# Patient Record
Sex: Male | Born: 1935
Health system: Southern US, Community
[De-identification: ages and names within clinical notes are randomized; demographics above are authoritative.]

## PROBLEM LIST (undated history)

## (undated) DIAGNOSIS — J309 Allergic rhinitis, unspecified: Secondary | ICD-10-CM

## (undated) DIAGNOSIS — E785 Hyperlipidemia, unspecified: Secondary | ICD-10-CM

## (undated) DIAGNOSIS — G4733 Obstructive sleep apnea (adult) (pediatric): Secondary | ICD-10-CM

## (undated) DIAGNOSIS — D126 Benign neoplasm of colon, unspecified: Secondary | ICD-10-CM

## (undated) DIAGNOSIS — M1990 Unspecified osteoarthritis, unspecified site: Secondary | ICD-10-CM

## (undated) DIAGNOSIS — F3289 Other specified depressive episodes: Secondary | ICD-10-CM

## (undated) DIAGNOSIS — K219 Gastro-esophageal reflux disease without esophagitis: Secondary | ICD-10-CM

## (undated) DIAGNOSIS — K589 Irritable bowel syndrome without diarrhea: Secondary | ICD-10-CM

## (undated) DIAGNOSIS — H1045 Other chronic allergic conjunctivitis: Secondary | ICD-10-CM

## (undated) DIAGNOSIS — F329 Major depressive disorder, single episode, unspecified: Secondary | ICD-10-CM

## (undated) DIAGNOSIS — IMO0002 Reserved for concepts with insufficient information to code with codable children: Secondary | ICD-10-CM

## (undated) DIAGNOSIS — F411 Generalized anxiety disorder: Secondary | ICD-10-CM

## (undated) HISTORY — PX: COLONOSCOPY: SHX174

## (undated) HISTORY — DX: Generalized anxiety disorder: F41.1

## (undated) HISTORY — PX: TONSILLECTOMY: SUR1361

## (undated) HISTORY — DX: Major depressive disorder, single episode, unspecified: F32.9

## (undated) HISTORY — DX: Other chronic allergic conjunctivitis: H10.45

## (undated) HISTORY — PX: APPENDECTOMY: SHX54

## (undated) HISTORY — DX: Irritable bowel syndrome, unspecified: K58.9

## (undated) HISTORY — DX: Obstructive sleep apnea (adult) (pediatric): G47.33

## (undated) HISTORY — DX: Unspecified osteoarthritis, unspecified site: M19.90

## (undated) HISTORY — DX: Allergic rhinitis, unspecified: J30.9

## (undated) HISTORY — DX: Reserved for concepts with insufficient information to code with codable children: IMO0002

## (undated) HISTORY — DX: Benign neoplasm of colon, unspecified: D12.6

## (undated) HISTORY — DX: Other specified depressive episodes: F32.89

## (undated) HISTORY — DX: Gastro-esophageal reflux disease without esophagitis: K21.9

## (undated) HISTORY — DX: Hyperlipidemia, unspecified: E78.5

## (undated) HISTORY — PX: ESOPHAGOGASTRODUODENOSCOPY: SHX1529

---
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
## 1999-11-26 ENCOUNTER — Encounter: Admission: RE | Admit: 1999-11-26 | Discharge: 1999-12-30 | Payer: Self-pay | Admitting: Orthopedic Surgery

## 2000-01-27 ENCOUNTER — Encounter: Payer: Self-pay | Admitting: Gastroenterology

## 2000-01-27 ENCOUNTER — Ambulatory Visit (HOSPITAL_COMMUNITY): Admission: RE | Admit: 2000-01-27 | Discharge: 2000-01-27 | Payer: Self-pay | Admitting: Gastroenterology

## 2001-01-24 ENCOUNTER — Encounter: Admission: RE | Admit: 2001-01-24 | Discharge: 2001-02-28 | Payer: Self-pay | Admitting: Orthopedic Surgery

## 2001-09-07 HISTORY — PX: CATARACT EXTRACTION W/ INTRAOCULAR LENS  IMPLANT, BILATERAL: SHX1307

## 2004-09-22 ENCOUNTER — Encounter: Admission: RE | Admit: 2004-09-22 | Discharge: 2004-12-21 | Payer: Self-pay | Admitting: Orthopedic Surgery

## 2004-10-20 ENCOUNTER — Ambulatory Visit: Payer: Self-pay | Admitting: Internal Medicine

## 2004-12-17 ENCOUNTER — Ambulatory Visit: Payer: Self-pay | Admitting: Internal Medicine

## 2005-02-19 ENCOUNTER — Ambulatory Visit: Payer: Self-pay | Admitting: Internal Medicine

## 2005-07-08 ENCOUNTER — Ambulatory Visit: Payer: Self-pay | Admitting: Internal Medicine

## 2005-07-22 ENCOUNTER — Ambulatory Visit: Payer: Self-pay | Admitting: Gastroenterology

## 2005-08-06 ENCOUNTER — Ambulatory Visit: Payer: Self-pay | Admitting: Gastroenterology

## 2005-09-17 ENCOUNTER — Ambulatory Visit: Payer: Self-pay | Admitting: Gastroenterology

## 2005-11-24 ENCOUNTER — Ambulatory Visit: Payer: Self-pay | Admitting: Internal Medicine

## 2005-12-18 ENCOUNTER — Ambulatory Visit: Payer: Self-pay | Admitting: Internal Medicine

## 2006-03-17 ENCOUNTER — Ambulatory Visit: Payer: Self-pay | Admitting: Internal Medicine

## 2006-07-16 ENCOUNTER — Ambulatory Visit: Payer: Self-pay | Admitting: Gastroenterology

## 2006-07-28 ENCOUNTER — Ambulatory Visit: Payer: Self-pay | Admitting: Internal Medicine

## 2006-12-15 ENCOUNTER — Ambulatory Visit: Payer: Self-pay | Admitting: Internal Medicine

## 2006-12-20 ENCOUNTER — Ambulatory Visit: Payer: Self-pay | Admitting: Internal Medicine

## 2007-03-21 ENCOUNTER — Ambulatory Visit: Payer: Self-pay | Admitting: Internal Medicine

## 2007-04-25 ENCOUNTER — Ambulatory Visit: Payer: Self-pay | Admitting: Internal Medicine

## 2007-05-05 ENCOUNTER — Ambulatory Visit: Payer: Self-pay | Admitting: Internal Medicine

## 2007-12-19 DIAGNOSIS — K589 Irritable bowel syndrome without diarrhea: Secondary | ICD-10-CM

## 2007-12-19 DIAGNOSIS — Z860100 Personal history of colon polyps, unspecified: Secondary | ICD-10-CM | POA: Insufficient documentation

## 2007-12-19 DIAGNOSIS — H1045 Other chronic allergic conjunctivitis: Secondary | ICD-10-CM

## 2007-12-19 DIAGNOSIS — F411 Generalized anxiety disorder: Secondary | ICD-10-CM

## 2007-12-19 DIAGNOSIS — J302 Other seasonal allergic rhinitis: Secondary | ICD-10-CM | POA: Insufficient documentation

## 2007-12-19 DIAGNOSIS — G4733 Obstructive sleep apnea (adult) (pediatric): Secondary | ICD-10-CM

## 2007-12-19 DIAGNOSIS — F329 Major depressive disorder, single episode, unspecified: Secondary | ICD-10-CM

## 2007-12-19 DIAGNOSIS — K219 Gastro-esophageal reflux disease without esophagitis: Secondary | ICD-10-CM | POA: Insufficient documentation

## 2007-12-20 ENCOUNTER — Ambulatory Visit: Payer: Self-pay | Admitting: Internal Medicine

## 2008-03-20 ENCOUNTER — Encounter: Payer: Self-pay | Admitting: Internal Medicine

## 2008-09-18 ENCOUNTER — Encounter: Payer: Self-pay | Admitting: Internal Medicine

## 2008-12-31 ENCOUNTER — Ambulatory Visit: Payer: Self-pay | Admitting: Internal Medicine

## 2009-12-30 ENCOUNTER — Ambulatory Visit: Payer: Self-pay | Admitting: Internal Medicine

## 2010-03-20 ENCOUNTER — Ambulatory Visit: Payer: Self-pay | Admitting: Internal Medicine

## 2010-03-20 DIAGNOSIS — J40 Bronchitis, not specified as acute or chronic: Secondary | ICD-10-CM

## 2010-03-27 ENCOUNTER — Telehealth (INDEPENDENT_AMBULATORY_CARE_PROVIDER_SITE_OTHER): Payer: Self-pay | Admitting: *Deleted

## 2010-04-09 ENCOUNTER — Encounter: Payer: Self-pay | Admitting: Internal Medicine

## 2010-04-10 ENCOUNTER — Telehealth (INDEPENDENT_AMBULATORY_CARE_PROVIDER_SITE_OTHER): Payer: Self-pay | Admitting: *Deleted

## 2010-04-11 ENCOUNTER — Ambulatory Visit: Payer: Self-pay | Admitting: Cardiovascular Disease

## 2010-04-11 ENCOUNTER — Ambulatory Visit: Payer: Self-pay | Admitting: Internal Medicine

## 2010-04-11 DIAGNOSIS — R0789 Other chest pain: Secondary | ICD-10-CM

## 2010-04-11 LAB — CONVERTED CEMR LAB
BUN: 17 mg/dL (ref 6–23)
CO2: 32 meq/L (ref 19–32)
Calcium: 8.8 mg/dL (ref 8.4–10.5)
Chloride: 103 meq/L (ref 96–112)
Creatinine, Ser: 0.9 mg/dL (ref 0.4–1.5)
GFR calc non Af Amer: 86.99 mL/min (ref >60)
Glucose, Bld: 103 mg/dL — ABNORMAL HIGH (ref 70–99)
Potassium: 4.7 meq/L (ref 3.5–5.1)
Sodium: 139 meq/L (ref 135–145)

## 2010-04-14 ENCOUNTER — Encounter: Payer: Self-pay | Admitting: Internal Medicine

## 2010-04-14 ENCOUNTER — Telehealth: Payer: Self-pay | Admitting: Internal Medicine

## 2010-04-14 ENCOUNTER — Ambulatory Visit: Payer: Self-pay | Admitting: Internal Medicine

## 2010-04-14 DIAGNOSIS — R1319 Other dysphagia: Secondary | ICD-10-CM

## 2010-04-29 ENCOUNTER — Ambulatory Visit: Payer: Self-pay | Admitting: Internal Medicine

## 2010-05-02 ENCOUNTER — Telehealth: Payer: Self-pay | Admitting: Internal Medicine

## 2010-05-16 ENCOUNTER — Ambulatory Visit: Payer: Self-pay | Admitting: Internal Medicine

## 2010-05-21 ENCOUNTER — Ambulatory Visit: Payer: Self-pay | Admitting: Internal Medicine

## 2010-05-21 ENCOUNTER — Ambulatory Visit: Admission: RE | Admit: 2010-05-21 | Discharge: 2010-05-21 | Payer: Self-pay | Admitting: Internal Medicine

## 2010-06-24 ENCOUNTER — Ambulatory Visit: Payer: Self-pay | Admitting: Internal Medicine

## 2010-09-14 ENCOUNTER — Emergency Department (HOSPITAL_COMMUNITY)
Admission: EM | Admit: 2010-09-14 | Discharge: 2010-09-14 | Payer: Self-pay | Source: Home / Self Care | Admitting: Emergency Medicine

## 2010-09-22 LAB — BASIC METABOLIC PANEL
BUN: 23 mg/dL (ref 6–23)
CO2: 28 mEq/L (ref 19–32)
Calcium: 8.8 mg/dL (ref 8.4–10.5)
Chloride: 104 mEq/L (ref 96–112)
Creatinine, Ser: 1.03 mg/dL (ref 0.4–1.5)
GFR calc Af Amer: >60 mL/min (ref >60)
GFR calc non Af Amer: >60 mL/min (ref >60)
Glucose, Bld: 138 mg/dL — ABNORMAL HIGH (ref 70–99)
Potassium: 4.1 mEq/L (ref 3.5–5.1)
Sodium: 139 mEq/L (ref 135–145)

## 2010-09-22 LAB — DIFFERENTIAL
Basophils Absolute: 0 10*3/uL (ref 0.0–0.1)
Basophils Relative: 0 % (ref 0–1)
Eosinophils Absolute: 0 10*3/uL (ref 0.0–0.7)
Eosinophils Relative: 0 % (ref 0–5)
Lymphocytes Relative: 3 % — ABNORMAL LOW (ref 12–46)
Lymphs Abs: 0.2 10*3/uL — ABNORMAL LOW (ref 0.7–4.0)
Monocytes Absolute: 0.2 10*3/uL (ref 0.1–1.0)
Monocytes Relative: 2 % — ABNORMAL LOW (ref 3–12)
Neutro Abs: 6.7 10*3/uL (ref 1.7–7.7)
Neutrophils Relative %: 95 % — ABNORMAL HIGH (ref 43–77)

## 2010-09-22 LAB — CBC
HCT: 40.5 % (ref 39.0–52.0)
Hemoglobin: 13.4 g/dL (ref 13.0–17.0)
MCH: 28.1 pg (ref 26.0–34.0)
MCHC: 33.1 g/dL (ref 30.0–36.0)
MCV: 84.9 fL (ref 78.0–100.0)
Platelets: 188 10*3/uL (ref 150–400)
RBC: 4.77 MIL/uL (ref 4.22–5.81)
RDW: 13.1 % (ref 11.5–15.5)
WBC: 7.1 10*3/uL (ref 4.0–10.5)

## 2010-10-09 NOTE — Letter (Signed)
Summary: EGD Instructions  [facility]  [address] [place], [place] [address]   Phone: [phone]  Fax: [phone]       [name] [name]    02/03/1931    MRN: [id number]       Procedure Day /Date: 04-29-10     Arrival Time: 7:30 AM      Procedure Time: 8:30 AM     Location of Procedure:                    X     [facility] (4th Floor)  PREPARATION FOR ENDOSCOPY   On 04-29-10 THE DAY OF THE PROCEDURE:  1.   No solid foods, milk or milk products are allowed after midnight the night before your procedure.  2.   Do not drink anything colored red or purple.  Avoid juices with pulp.  No orange juice.  3.  You may drink clear liquids until  6:30 AM , which is 2 hours before your procedure.                                                                                                CLEAR LIQUIDS INCLUDE: Water Jello Ice Popsicles Tea (sugar ok, no milk/cream) Powdered fruit flavored drinks Coffee (sugar ok, no milk/cream) Gatorade Juice: apple, white grape, white cranberry  Lemonade Clear bullion, consomm, broth Carbonated beverages (any kind) Strained chicken noodle soup Hard Candy   MEDICATION INSTRUCTIONS  Unless otherwise instructed, you should take regular prescription medications with a small sip of water as early as possible the morning of your procedure.        OTHER INSTRUCTIONS  You will need a responsible adult at least 75 years of age to accompany you and drive you home.   This person must remain in the waiting room during your procedure.  Wear loose fitting clothing that is easily removed.  Leave jewelry and other valuables at home.  However, you may wish to bring a book to read or an iPod/MP3 player to listen to music as you wait for your procedure to start.  Remove all body piercing jewelry and leave at home.  Total time from sign-in until discharge is approximately 2-3 hours.  You should go home directly after your  procedure and rest.  You can resume normal activities the day after your procedure.  The day of your procedure you should not:   Drive   Make legal decisions   Operate machinery   Drink alcohol   Return to work  You will receive specific instructions about eating, activities and medications before you leave.    The above instructions have been reviewed and explained to me by   _______________________    I fully understand and can verbalize these instructions _____________________________ Date _________

## 2010-10-09 NOTE — Assessment & Plan Note (Signed)
Summary: ROV ///KP   Copy to:  Jetty Duhamel MD Primary Provider/Referring Provider:  Dossie Arbour MD  CC:  follow up visit-still having some pain in center of chest; Test results attached..  History of Present Illness: April 11, 2010-Allergic rhinoconjunctivitis, OSA, chest pain..................[name] [name] here They say he is no better. Ok when he wakes, but as he talks in the morning, he begins to hurt at xiphoid. Dr Eloise Harman did CXR "clear" and gave him magic mouthwash. He saw Dr Jearld Fenton for ENT- intended because he had been complaining of a change in his voice/ throat with difficulty reaching high notes. Dr Jearld Fenton told him vocal cords looked ok. He has been taking maalox regularly after every meal. Eating doesn't affect this xiphoid pain. He denies duplication of pain by thumb pressure on xiphoid or bilateral rib compression.  May 16, 2010- Allergic rhinoconjunctivitis, Hx OSA, Chest pain, GERD................[name] [name] here Still indicates focal midsternal/ xiphoid pain as dull ache 2 inches above tip of xiphoid and can feel air flow through it "like a raw spot" if he tries to sing or raises his voice. ENT exam was negative and Endoscopy by Dr Leone Payor suggested dysmotility but otherwise unremarka ble. CT had shown dilated esophagus. Relation to hard singing remains unlcear, but he says he isn't able to sing now because it hurts.. Denies dyspnea with rest or exertion, cough or wheeze. He is quite anxious about this discomfort which hes says hasn't improved in 2 months. He asks again about bronchoscopy to look in his trachea.  Seasonal rhinitis mainly Spring- wears mask and wrap around glasses then, not needed in the Fall.  June 24, 2010-  Allergic rhinoconjunctivitis, Hx OSA, Chest pain, GERD................[name] [name] here Still hurts under xiphoid if he has to push his speech harder, like talking to his brother who is hard of hearing. He likes the nebulizer, saying 9 breaths are enough, done two times  a day. Eating also  reduces the pain. He learned of someone elsewhere who had a similar discomfort and had to stop all singing for 6 months. Denies SOB or dysphagia.  Bronchoscopy  reviewed again- cytology negative. Evidence of vocal cord trauma c/w hard singing was noted.    Preventive Screening-Counseling & Management  Alcohol-Tobacco     Smoking Status: never  Current Medications (verified): 1)  Flunisolide 29 Mcg/act  Soln (Flunisolide) .... Use As Needed 2)  Omeprazole 20 Mg  Cpdr (Omeprazole) .... Take 1 Tablet By Mouth Two Times A Day 3)  Librax 2.5-5 Mg  Caps (Clidinium-Chlordiazepoxide) .... Take 1 To 2 Tabs By Mouth Before Meals and At Bedtime. 4)  Acular 0.5 % Soln (Ketorolac Tromethamine) .... Use As Directed 5)  Cpap 10 Cwp Advanced 6)  Pravachol 10 Mg Tabs (Pravastatin Sodium) .... Take 1 By Mouth Once Daily 7)  Asap Solution .... Use As Directed Per Directions On Package.  Allergies (verified): 1)  ! Penicillin 2)  ! Amoxicillin  Past History:  Past Surgical History: Last updated: 01-13-09 Tonsillectomy Appendectomy- ruptured  Family History: Last updated: 2009/01/13 Father- died colon cancer, [employer] coal miner [employer]- died pneumonia, MI, CVA  Social History: Last updated: 05/16/2010 Patient never smoked.  Married [employer] x 4 years  Risk Factors: Smoking Status: never (06/24/2010)  Past Medical History: DEPRESSION (ICD-311) ANXIETY (ICD-300.00) GERD (ICD-530.81)- Esoph dysmotility, dilated stricture                                 -  Endoscopy Dr Leone Payor 04/29/10 ALLERGIC RHINITIS (ICD-477.9)- stopped allergy vaccine 2008 Chest pain-atypical- bronchoscopy 05/21/10- vocal cord trauma OBSTRUCTIVE SLEEP APNEA (ICD-327.23) ALLERGIC CONJUNCTIVITIS (ICD-372.14) POLYP, COLON (ICD-211.3) IBS (ICD-564.1)    Review of Systems      See HPI       The patient complains of chest pain.  The patient denies shortness of breath with activity, shortness of  breath at rest, productive cough, non-productive cough, coughing up blood, irregular heartbeats, acid heartburn, indigestion, loss of appetite, weight change, abdominal pain, difficulty swallowing, sore throat, tooth/dental problems, headaches, nasal congestion/difficulty breathing through nose, sneezing, itching, joint stiffness or pain, rash, and change in color of mucus.    Vital Signs:  Patient profile:   77 year old male Height:      73 inches Weight:      186.25 pounds BMI:     24.66 O2 Sat:      97 % on Room air Pulse rate:   62 / minute BP sitting:   112 / 64  (left arm) Cuff size:   regular  Vitals Entered By: Reynaldo Minium CMA (June 24, 2010 3:07 PM)  O2 Flow:  Room air CC: follow up visit-still having some pain in center of chest; Test results attached.   Physical Exam  Additional Exam:  General: A/Ox3; pleasant and cooperative, NAD, WDWN SKIN: no rash, lesions NODES: no lymphadenopathy HEENT: [place]/AT, EOM- WNL, Conjuctivae- clear, PERRLA, TM-WNL, Nose- clear, Throat- clear and wnl, Mallampati II-III NECK: Supple w/ fair ROM, JVD- none, normal carotid impulses w/o bruits Thyroid- normal to palpation CHEST: Clear to P&A. Not obvioulsy tender to light pressure lower sternum. No sternal rub or noise HEART: RRR, no m/g/r heard ABDOMEN: Soft and nl; JXB:JYNW, nl pulses, no edema  NEURO: Grossly intact to observation      Impression & Recommendations:  Problem # 1:  CHEST PAIN, ATYPICAL (ICD-786.59)  This was some sort of muscle strain syndrome related to singing too hard. Bronchoscopy was benign except for the vocal cord strain.  I can only suggest routine analgesics and avoidance of strain/ hard siging.  Problem # 2:  BRONCHITIS, MILD (ICD-490)  He is doing well and is clear now. He agrees to flu shot.   Problem # 3:  DYSPHAGIA (GNF-621.30) He will follow with GI for his esophageal problems as directed. Reflux precautions were reviewed.   Medications Added to  Medication List This Visit: 1)  Asap Solution  .... Use as directed per directions on package.  Other Orders: Est. Patient Level III (86578) Flu Vaccine 63yrs + MEDICARE PATIENTS (I6962) Administration Flu vaccine - MCR (X5284)  Patient Instructions: 1)  Please schedule a follow-up appointment in 1 year. 2)  Flu vax   Flu Vaccine Consent Questions     Do you have a history of severe allergic reactions to this vaccine? no    Any prior history of allergic reactions to egg and/or gelatin? no    Do you have a sensitivity to the preservative Thimersol? no    Do you have a past history of Guillan-Barre Syndrome? no    Do you currently have an acute febrile illness? no    Have you ever had a severe reaction to latex? no    Vaccine information given and explained to patient? yes    Are you currently pregnant? no    Lot Number:[id number]   Exp Date:03/07/2011   Site Given  Left Deltoid IMt-CCC] Randell Loop CMA  June 24, 2010 4:09 PM       .  [email or web address]

## 2010-10-09 NOTE — Assessment & Plan Note (Signed)
Summary: 1 YEAR RETURN/MH   Primary Provider/Referring Provider:  Dossie Arbour  CC:  Yearly follow up visit.  History of Present Illness: 12/20/07- Mr. Landin returns for follow-up of allergic rhinitis with conjunctivitis and of obstructive sleep apnea.he has stopped allergy vaccine because of cost r a year ago.  He has done very well so far with no seasonal flare, yet this spring.  He wears a surgical mask to work outside and he wears wrap- around sunglasses to keep the dust and pollen out of his eyes. he does have a cortisone nose spray used when needed.  He is compliant with CPAP at 10CWP using it all night every night.  He is satisfied with his sleep management.  His eye doctor had given him Elestat eyedrops.  Jan 21, 2009- Allergic rhinoconjunctivitis, OSA Copntinues CPAP 10- says he can't sleep without it. Compliance check was excellent at last report from DME. He sleeps very well and denies awareness of breakthrough snoring.  Allergy- wears mask outdoors in season. needed Zpak for cold with sinus pain last week, responsive to Z pak. Back to singing in choir. Good f/u at [facility] clinic for general care. wears wrap around glasses to protect his eyes. Discussed his hx of GERD- much better on two times a day omeprazole.  December 30, 2009- Allergic rhinoconjunctivitis, OSA He still wears wrap around sun glasses to keep draft out of his eyes. Wears mask outside in Spring. Acular drops stop the itch in his eyes. We compared dry eye with allergic iritation. Uses Systane. Never breathing problems- sings regularly.  CPAP- used all night every night at 10 with good control. Weight loss with dieting as he tries to manage his ulcer disease.    Current Medications (verified): 1)  Flunisolide 29 Mcg/act  Soln (Flunisolide) .... Use As Needed 2)  Omeprazole 20 Mg  Cpdr (Omeprazole) .... Take 1 Tablet By Mouth Once A Day 3)  Librax 2.5-5 Mg  Caps (Clidinium-Chlordiazepoxide) .... Take 1 To 2 Tabs By Mouth  Before Meals and At Bedtime. 4)  Acular 0.5 % Soln (Ketorolac Tromethamine) .... Use As Directed 5)  Cpap 10 Cwp Advanced 6)  Pravachol 10 Mg Tabs (Pravastatin Sodium) .... Take 1 By Mouth Once Daily  Allergies (verified): 1)  ! Penicillin 2)  ! Amoxicillin  Past History:  Past Surgical History: Last updated: 2009/01/21 Tonsillectomy Appendectomy- ruptured  Family History: Last updated: 2009-01-21 Father- died colon cancer, Black Lung coal miner [employer]- died pneumonia, MI, CVA  Social History: Last updated: 12/20/2007 Patient never smoked.   Risk Factors: Smoking Status: never (12/20/2007)  Past Medical History: DEPRESSION (ICD-311) ANXIETY (ICD-300.00) GERD (ICD-530.81) ALLERGIC RHINITIS (ICD-477.9)- stopped allergy vaccine 2008 OBSTRUCTIVE SLEEP APNEA (ICD-327.23) ALLERGIC CONJUNCTIVITIS (ICD-372.14) POLYP, COLON (ICD-211.3) IBS (ICD-564.1)    Review of Systems      See HPI  The patient denies anorexia, fever, weight loss, weight gain, vision loss, decreased hearing, hoarseness, chest pain, syncope, dyspnea on exertion, peripheral edema, prolonged cough, headaches, hemoptysis, abdominal pain, and severe indigestion/heartburn.    Vital Signs:  Patient profile:   77 year old male Height:      73 inches Weight:      189.50 pounds BMI:     25.09 O2 Sat:      95 % on Room air Pulse rate:   79 / minute BP sitting:   118 / 78  (left arm) Cuff size:   regular  Vitals Entered By: Reynaldo Minium CMA (December 30, 2009 11:06 AM)  O2 Flow:  Room air  Physical Exam  Additional Exam:  General: A/Ox3; pleasant and cooperative, NAD, WDWN SKIN: no rash, lesions NODES: no lymphadenopathy HEENT: [place]/AT, EOM- WNL, Conjuctivae- clear, PERRLA, TM-WNL, Nose- clear, Throat- clear and wnl, Mallampati II-III NECK: Supple w/ fair ROM, JVD- none, normal carotid impulses w/o bruits Thyroid- normal to palpation CHEST: Clear to P&A HEART: RRR, no m/g/r heard ABDOMEN: Soft and  nl; ZOX:WRUE, nl pulses, no edema  NEURO: Grossly intact to observation      Impression & Recommendations:  Problem # 1:  OBSTRUCTIVE SLEEP APNEA (ICD-327.23)  Great compliance and control. No cpap changes needed.  Problem # 2:  ALLERGIC RHINITIS (ICD-477.9)  Continues to do well, partly with use of mask and wrapped around glasses. His updated medication list for this problem includes:    Flunisolide 29 Mcg/act Soln (Flunisolide) ..... Use as needed  Medications Added to Medication List This Visit: 1)  Omeprazole 20 Mg Cpdr (Omeprazole) .... Take 1 tablet by mouth three times a day  Other Orders: Est. Patient Level III (45409)  Patient Instructions: 1)  Please schedule a follow-up appointment in 1 year. 2)  continue CPAP

## 2010-10-09 NOTE — Progress Notes (Signed)
Summary: omeprazole ?'s  Phone Note Outgoing Call Call back at [facility] Phone [phone]   Call placed by: Laureen Ochs LPN,  May 02, 2010 2:17 PM Call placed to: Patient Summary of Call: I was told he had some ?'s about PPI please follow-up with him re: his concerns about omeprazole Iva Boop MD, [facility]  May 02, 2010 1:37 PM   Follow-up for Phone Call        Pt. states he read in his "pill book" that Omeprazole shouldn't be used as a long term med for ulcers.  Pt. takes Omeprazole two times a day, states he has so much acid he needs  2 daily.  Symptoms are fairly well controlled, but pt. uses Maalox after every meal to help control the acid, pt. states he has been doing this for years.  Follow-up by: Laureen Ochs LPN,  May 02, 2010 2:20 PM  Additional Follow-up for Phone Call Additional follow up Details #1::        Explain he is taking it for GERD  Thanks continue what he is doing Additional Follow-up by: Iva Boop MD, Clementeen Graham,  May 02, 2010 3:58 PM    Additional Follow-up for Phone Call Additional follow up Details #2::    Message left for pt. with above MD instructions. Pt. instructed to call back as needed.  Follow-up by: Laureen Ochs LPN,  May 02, 2010 4:12 PM

## 2010-10-09 NOTE — Assessment & Plan Note (Signed)
Summary: 3 weeks/ mbw   Copy to:  Jetty Duhamel MD Primary Provider/Referring Provider:  Dossie Arbour MD  CC:  3 week follow up visit-OSA and Atypical chest pain(last OV);endo results attached..  History of Present Illness: March 20, 2010- Allergic rhinoconjunctivitis, OSA............................Marland Kitchenwife here He sang a long string of songs 2 weeks ago at church, and seems to question if he strained his chest.. Has been getting weak, feeling irritated deep to his mid chest. Denies exertional or pleuritic pain. He has a significant past hx of peptic ulcer disease and admits his discomfort feels a little like that at his xiphoid. Has been taking a "silver solution" patent medicine which seems to make him feel beter.  April 11, 2010-Allergic rhinoconjunctivitis, OSA, chest pain..................Marland Kitchenwife here They say he is no better. Ok when he wakes, but as he talks in the morning, he begins to hurt at xiphoid. Dr Eloise Harman did CXR "clear" and gave him magic mouthwash. He saw Dr Jearld Fenton for ENT- intended because he had been complaining of a change in his voice/ throat with difficulty reaching high notes. Dr Jearld Fenton told him vocal cords looked ok. He has been taking maalox regularly after every meal. Eating doesn't affect this xiphoid pain. He denies duplication of pain by thumb pressure on xiphoid or bilateral rib compression.  May 16, 2010- Allergic rhinoconjunctivitis, Hx OSA, Chest pain, GERD................Marland Kitchenwife here Still indicates focal midsternal/ xiphoid pain as dull ache 2 inches above tip of xiphoid and can feel air flow through it "like a raw spot" if he tries to sing or raises his voice. ENT exam was negative and Endoscopy by Dr Leone Payor suggested dysmotility but otherwise unremarka ble. CT had shown dilated esophagus. Relation to hard singing remains unlcear, but he says he isn't able to sing now because it hurts.. Denies dyspnea with rest or exertion, cough or wheeze. He is quite anxious  about this discomfort which hes says hasn't improved in 2 months. He asks again about bronchoscopy to look in his trachea.  Seasonal rhinitis mainly Spring- wears mask and wrap around glasses then, not needed in the Fall.      Preventive Screening-Counseling & Management  Alcohol-Tobacco     Smoking Status: never  Current Medications (verified): 1)  Flunisolide 29 Mcg/act  Soln (Flunisolide) .... Use As Needed 2)  Omeprazole 20 Mg  Cpdr (Omeprazole) .... Take 1 Tablet By Mouth Two Times A Day 3)  Librax 2.5-5 Mg  Caps (Clidinium-Chlordiazepoxide) .... Take 1 To 2 Tabs By Mouth Before Meals and At Bedtime. 4)  Acular 0.5 % Soln (Ketorolac Tromethamine) .... Use As Directed 5)  Cpap 10 Cwp Advanced 6)  Pravachol 10 Mg Tabs (Pravastatin Sodium) .... Take 1 By Mouth Once Daily 7)  Asap Solution .... 2 Teaspoons (Hold Under Tongue For 30 Seconds) Then Swallow Once Daily  Allergies (verified): 1)  ! Penicillin 2)  ! Amoxicillin  Past History:  Past Surgical History: Last updated: 2009/01/09 Tonsillectomy Appendectomy- ruptured  Family History: Last updated: Jan 09, 2009 Father- died colon cancer, Black Lung coal miner [employer]er- died pneumonia, MI, CVA  Social History: Last updated: 05/16/2010 Patient never smoked.  Married [employer] 4 years  Risk Factors: Smoking Status: never (05/16/2010)  Past Medical History: DEPRESSION (ICD-311) ANXIETY (ICD-300.00) GERD (ICD-530.81)- Esoph dysmotility, dilated stricture                                 -Endoscopy Dr Leone Payor 04/29/10  ALLERGIC RHINITIS (ICD-477.9)- stopped allergy vaccine 2008 Chest pain-atyoical OBSTRUCTIVE SLEEP APNEA (ICD-327.23) ALLERGIC CONJUNCTIVITIS (ICD-372.14) POLYP, COLON (ICD-211.3) IBS (ICD-564.1)    Social History: Patient never smoked.  Married [employer] x 4 years  Review of Systems      See HPI       The patient complains of chest pain.  The patient denies shortness of breath with activity,  shortness of breath at rest, productive cough, non-productive cough, coughing up blood, irregular heartbeats, acid heartburn, indigestion, loss of appetite, weight change, abdominal pain, difficulty swallowing, sore throat, tooth/dental problems, headaches, nasal congestion/difficulty breathing through nose, and sneezing.    Vital Signs:  Patient profile:   77 year old male Height:      73 inches Weight:      187.13 pounds BMI:     24.78 O2 Sat:      98 % on Room air Pulse rate:   57 / minute BP sitting:   128 / 82  (left arm) Cuff size:   regular  Vitals Entered By: Reynaldo Minium CMA (May 16, 2010 10:13 AM)  O2 Flow:  Room air CC: 3 week follow up visit-OSA and Atypical chest pain(last OV);endo results attached.   Physical Exam  Additional Exam:  General: A/Ox3; pleasant and cooperative, NAD, WDWN SKIN: no rash, lesions NODES: no lymphadenopathy HEENT: [place]/AT, EOM- WNL, Conjuctivae- clear, PERRLA, TM-WNL, Nose- clear, Throat- clear and wnl, Mallampati II-III NECK: Supple w/ fair ROM, JVD- none, normal carotid impulses w/o bruits Thyroid- normal to palpation CHEST: Clear to P&A. Not obvioulsy tender to light pressure lower sternum. No sternal rub or noise HEART: RRR, no m/g/r heard ABDOMEN: Soft and nl; ZOX:WRUE, nl pulses, no edema  NEURO: Grossly intact to observation      Impression & Recommendations:  Problem # 1:  CHEST PAIN, ATYPICAL (ICD-786.59)  He may have injured his diaphram at the hiatus, or his anterior chest wall with his hard singing, but it isn't apparent. It will probably just take time to heal. He is focused on this sense of pain and airflow he feels is inside his trachea-"something raw or inside my trachea". We discussed bronchoscopy in detail, discussing use of sedation, possible biopsy, and broad risk discussion including potential for cardiovascular complications, respiratory distress/ intubation, bleeding and failure to make a diagnosis.  Problem  # 2:  BRONCHITIS, MILD (ICD-490)  Clear now and no specific intervention needed.  Problem # 3:  OBSTRUCTIVE SLEEP APNEA (ICD-327.23) He and his wife are not concerned at this time.  Other Orders: Est. Patient Level IV (45409) Pulmonary Referral (Pulmonary)  Patient Instructions: 1)  Please schedule a follow-up appointment in 1 month. 2)  See [facility] to schedule bronchoscopy. 3)  We will ask that you not eat or drink anything after a light early breakfast that morning.  4)  Don't take any aspirin products for 3 days ahead.

## 2010-10-09 NOTE — Procedures (Signed)
Summary: Upper Endoscopy w/DIL  Patient: [name] [name] Note: All result statuses are Final unless otherwise noted.  Tests: (1) Upper Endoscopy w/DIL (UED)  UED Upper Endoscopy w/DIL                             DONE      [facility]     [address]     [place], [place]  [address]           ENDOSCOPY PROCEDURE REPORT           PATIENT:  [name] [name]  MR#:  [id number]     BIRTHDATE:  1930/09/29, 78 yrs. old  GENDER:  male           ENDOSCOPIST:  Iva Boop, MD, [facility]           PROCEDURE DATE:  04/29/2010     PROCEDURE:  EGD, diagnostic, Elease Hashimoto Dilation of the Esophagus     ASA CLASS:  Class II     INDICATIONS:  1) dysphagia  2) abnormal GI imaging dilated     esophagus on CT           MEDICATIONS:   Fentanyl 25 mcg IV, Versed 4 mg IV     TOPICAL ANESTHETIC:  Exactacain Spray           DESCRIPTION OF PROCEDURE:   After the risks benefits and     alternatives of the procedure were thoroughly explained, informed     consent was obtained.  The [id number] endoscope was     introduced through the mouth and advanced to the second portion of     the duodenum, without limitations.  The instrument was slowly     withdrawn as the mucosa was carefully examined.     <<PROCEDUREIMAGES>>           Larynx was seen and was without obvious deformity. defer further     examination to ENT and/or pulmonary as needed.A dilatation was     found in the total esophagus. Mildly dilated with small amount of     retained foamy secretions.  Normal GE junction was noted.  The     stomach was entered and closely examined. The antrum, angularis,     and lesser curvature were well visualized, including a retroflexed     view of the cardia and fundus. The stomach wall was normally     distensable. The scope passed easily through the pylorus into the     duodenum.  The duodenal bulb was normal in appearance, as was the     postbulbar duodenum.    Dilation was then performed at the  total     esophagus           1) Dilator:  Elease Hashimoto  Size(s):  54 French     Resistance:  minimal  Heme:  none           COMPLICATIONS:  None           ENDOSCOPIC IMPRESSION:     1) Dilatation in the total esophagus - likely indicative of some     dysmotility. No strictures or mucosal abnormality seen. Dilation     (54 [place]) performed because of dysphagia issues.     2) Normal GE junction     3) Normal stomach     4) Normal duodenum  RECOMMENDATIONS:     Clear liquids until 1030 AM then soft foods today. Normal foods     (for him) tomorrow.     Follow-up with Dr. Leone [name] as needed.     Keep follow-up with Dr. Maple [name] as planned. (I saw the larynx and     believe it was ok - defer further evaluation to pulmonary or ENT     if needed).           REPEAT EXAM:  as needed           Iva Boop, MD, Clement[facility] Graham           CC:  [name] [name], M.D.     [name] [name], M.D.     The Patient           n.     eSIGNED:   Iva Boop at 04/29/2010 09:21 AM           [name] [name], [id number]  Note: An exclamation mark (!) indicates a result that was not dispersed into the flowsheet. Document Creation Date: 04/29/2010 9:23 AM _______________________________________________________________________  (1) Order result status: Final Collection or observation date-time: 04/29/2010 09:07 Requested date-time:  Receipt date-time:  Reported date-time:  Referring Physician:   Ordering Physician: Stan Head [phone]) Specimen Source:  Source: Launa Grill Order Number: [id number] Lab site:

## 2010-10-09 NOTE — Assessment & Plan Note (Signed)
Summary: rov/ mbw   Primary Provider/Referring Provider:  Dossie Arbour  CC:  Follow up visit-sleep and allergies.  History of Present Illness:  2009-01-13- Allergic rhinoconjunctivitis, OSA Copntinues CPAP 10- says he can't sleep without it. Compliance check was excellent at last report from DME. He sleeps very well and denies awareness of breakthrough snoring.  Allergy- wears mask outdoors in season. needed Zpak for cold with sinus pain last week, responsive to Z pak. Back to singing in choir. Good f/u at [facility] clinic for general care. wears wrap around glasses to protect his eyes. Discussed his hx of GERD- much better on two times a day omeprazole.  December 30, 2009- Allergic rhinoconjunctivitis, OSA He still wears wrap around sun glasses to keep draft out of his eyes. Wears mask outside in Spring. Acular drops stop the itch in his eyes. We compared dry eye with allergic iritation. Uses Systane. Never breathing problems- sings regularly.  CPAP- used all night every night at 10 with good control. Weight loss with dieting as he tries to manage his ulcer disease.  March 20, 2010- Allergic rhinoconjunctivitis, OSA............................Marland Kitchenwife here He sang a long string of songs 2 weeks ago at church, and seems to question if he strained his chest.. Has been getting weak, feeling irritated deep to his mid chest. Denies exertional or pleuritic pain. He has a significant past hx of peptic ulcer disease and admits his discomfort feels a little like that at his xiphoid. Has been taking a "silver solution" patent medicine which seems to make him feel beter.    Preventive Screening-Counseling & Management  Alcohol-Tobacco     Smoking Status: never  Current Medications (verified): 1)  Flunisolide 29 Mcg/act  Soln (Flunisolide) .... Use As Needed 2)  Omeprazole 20 Mg  Cpdr (Omeprazole) .... Take 1 Tablet By Mouth Three Times A Day 3)  Librax 2.5-5 Mg  Caps (Clidinium-Chlordiazepoxide) .... Take 1  To 2 Tabs By Mouth Before Meals and At Bedtime. 4)  Acular 0.5 % Soln (Ketorolac Tromethamine) .... Use As Directed 5)  Cpap 10 Cwp Advanced 6)  Pravachol 10 Mg Tabs (Pravastatin Sodium) .... Take 1 By Mouth Once Daily 7)  Asap Solution .... 2 Teaspoons (Hold Under Tongue For 30 Seconds) Then Swallow Once Daily  Allergies (verified): 1)  ! Penicillin 2)  ! Amoxicillin  Past History:  Past Medical History: Last updated: 12/30/2009 DEPRESSION (ICD-311) ANXIETY (ICD-300.00) GERD (ICD-530.81) ALLERGIC RHINITIS (ICD-477.9)- stopped allergy vaccine 2008 OBSTRUCTIVE SLEEP APNEA (ICD-327.23) ALLERGIC CONJUNCTIVITIS (ICD-372.14) POLYP, COLON (ICD-211.3) IBS (ICD-564.1)    Past Surgical History: Last updated: 01-13-2009 Tonsillectomy Appendectomy- ruptured  Family History: Last updated: 2009-01-13 Father- died colon cancer, Black Lung coal miner [place]- died pneumonia, MI, CVA  Social History: Last updated: 12/20/2007 Patient never smoked.   Risk Factors: Smoking Status: never (03/20/2010)  Review of Systems      See HPI       The patient complains of non-productive cough.  The patient denies shortness of breath with activity, shortness of breath at rest, productive cough, chest pain, irregular heartbeats, acid heartburn, indigestion, loss of appetite, weight change, abdominal pain, difficulty swallowing, sore throat, tooth/dental problems, headaches, nasal congestion/difficulty breathing through nose, and sneezing.    Vital Signs:  Patient profile:   77 year old male Height:      73 inches Weight:      186.50 pounds BMI:     24.69 O2 Sat:      97 % on Room air Pulse rate:  59 / minute BP sitting:   128 / 82  (left arm) Cuff size:   regular  Vitals Entered By: Reynaldo Minium CMA (March 20, 2010 11:13 AM)  O2 Flow:  Room air CC: Follow up visit-sleep and allergies   Physical Exam  Additional Exam:  General: A/Ox3; pleasant and cooperative, NAD, WDWN SKIN: no rash,  lesions NODES: no lymphadenopathy HEENT: [place]/AT, EOM- WNL, Conjuctivae- clear, PERRLA, TM-WNL, Nose- clear, Throat- clear and wnl, Mallampati II-III NECK: Supple w/ fair ROM, JVD- none, normal carotid impulses w/o bruits Thyroid- normal to palpation CHEST: Clear to P&A HEART: RRR, no m/g/r heard ABDOMEN: Soft and nl; ZOX:WRUE, nl pulses, no edema  NEURO: Grossly intact to observation      Impression & Recommendations:  Problem # 1:  ALLERGIC RHINITIS (ICD-477.9)  He remains satisfied off allergy vaccine. He wears protective masks and glasses outdoors. We agreed to give him a depo shot and let him hold a script for an antibiotic. His updated medication list for this problem includes:    Flunisolide 29 Mcg/act Soln (Flunisolide) ..... Use as needed  Problem # 2:  GERD (ICD-530.81)  Question if his old ulcer disease is causing some substerna/ xiphoid distress that might have been aggravated by his aggresive singing. I suggested voice rest and he will continue aggressive use of his acid blocker. I couldn't tell that anything particularly related to his sense that he has been "weak". His updated medication list for this problem includes:    Omeprazole 20 Mg Cpdr (Omeprazole) .Marland Kitchen... Take 1 tablet by mouth three times a day    Librax 2.5-5 Mg Caps (Clidinium-chlordiazepoxide) .Marland Kitchen... Take 1 to 2 tabs by mouth before meals and at bedtime.  Problem # 3:  OBSTRUCTIVE SLEEP APNEA (ICD-327.23)  He remains complinant with CPAP all night every night  Orders: Est. Patient Level III (45409)  Problem # 4:  BRONCHITIS, MILD (ICD-490)  He believes he has or did have an infection. i will address this and maybe relive some anxiety with a depo shot and Z apk. Orders: Est. Patient Level III (81191) Admin of Therapeutic Inj  intramuscular or subcutaneous (47829) Depo- Medrol 80mg  (J1040)  His updated medication list for this problem includes:    Zithromax Z-pak 250 Mg Tabs (Azithromycin) .Marland Kitchen... 2  today then one daily  Medications Added to Medication List This Visit: 1)  Asap Solution  .... 2 teaspoons (hold under tongue for 30 seconds) then swallow once daily 2)  Zithromax Z-pak 250 Mg Tabs (Azithromycin) .... 2 today then one daily  Patient Instructions: 1)  Please schedule a follow-up appointment in 1 year. 2)  Script for Z pak sent to drug store 3)  Depo 80 4)  Take your acid blocker regularly now if you think your ulcer disease is acting up. Prescriptions: ZITHROMAX Z-PAK 250 MG TABS (AZITHROMYCIN) 2 today then one daily  #1 pak x 0   Entered and Authorized by:   Waymon Budge MD   Signed by:   Waymon Budge MD on 03/20/2010   Method used:   Electronically to        [facility] [address] [phone]* (retail)       [address] [place], [place]  [address]       Ph: [phone]       Fax: [phone]   RxID:   [id number]      Medication Administration  Injection # 1:    Medication:  Depo- Medrol 80mg     Diagnosis: BRONCHITIS, MILD (ICD-490)    Route: SQ    Site: LUOQ gluteus    Exp Date: 12/2012    Lot #: [id number]    Mfr: Pharmacia    Patient tolerated injection without complications    Given by: Reynaldo Minium CMA (March 20, 2010 12:18 PM)  Orders Added: 1)  Est. Patient Level III [40981] 2)  Admin of Therapeutic Inj  intramuscular or subcutaneous [96372] 3)  Depo- Medrol 80mg  [J1040]

## 2010-10-09 NOTE — Letter (Signed)
Summary: [facility]  [facility]   Imported By: Sherian Rein 04/18/2010 08:34:27  _____________________________________________________________________  External Attachment:    Type:   Image     Comment:   External Document

## 2010-10-09 NOTE — Progress Notes (Signed)
Summary: Triage  Phone Note From Other Clinic   Caller: Katie in Pulmonary [phone] Call For: Dr. Leone Payor Summary of Call: Abnormal Shattering of the esphagus on CT scan....requesting a sooner appt. than next avail. Initial call taken by: Karna Christmas,  April 14, 2010 8:48 AM  Follow-up for Phone Call        Pt. can see Willette Cluster NP today at 2pm. Florentina Addison will advise pt. of appt/med.list/co-pay.  Follow-up by: Laureen Ochs LPN,  April 14, 2010 9:03 AM

## 2010-10-09 NOTE — Progress Notes (Signed)
Summary: SPEAK TO NURSE  Phone Note Call from Patient Call back at Home Phone [phone]   Caller: Patient Call For: YOUNG Summary of Call: PT HAS AN APPT TOMORROW. HAS A QUESTION FOR NURSE RE: STERNUM/ BRONCHS, ETC.  Initial call taken by: Tivis Ringer, CNA,  April 10, 2010 12:07 PM  Follow-up for Phone Call        Pt has questions about needing a bronch for a problem he is having with his trachea.  Instructed pt he should discuss this with Dr Maple Hudson at [facility] tomorrow 04-11-10. Abigail Miyamoto RN  April 10, 2010 12:37 PM

## 2010-10-09 NOTE — Progress Notes (Signed)
Summary: sternum pain  Phone Note Call from Patient Call back at Home Phone [phone]   Caller: Patient Call For: young Summary of Call: pt was seen by dr young 7/14. pt states that while he feels about "30% better" he still has pain in his sternum/ it feels "strained". still hoarse Initial call taken by: Tivis Ringer, CNA,  March 27, 2010 9:37 AM  Follow-up for Phone Call        called and spoke with pt--he stated that he may be about 20% better but still has the pain in his sternum mostly when he talks.  his trachea feels strained--has been having trouble using his cpap machine--makes the pain worse--but was able to use the cpap last night.  he stated that he is always clearing his throat...please advise. thanks Randell Loop CMA  March 27, 2010 10:33 AM   Additional Follow-up for Phone Call Additional follow up Details #1::        He may do best to treat this with a liquid coating antacid like Mylanta,  and a pain med for chest wall pain, like ES Tylenol or Advil. Additional Follow-up by: Waymon Budge MD,  March 27, 2010 1:44 PM    Additional Follow-up for Phone Call Additional follow up Details #2::    called spoke with patient and advised of CDY recs. pt verbalized his understanding.  will call us back if symptoms do not improve.  Follow-up by: Boone Master CNA/MA,  March 27, 2010 3:23 PM

## 2010-10-09 NOTE — Assessment & Plan Note (Signed)
Summary: ABNORMAL CT/? TEAR IN ESOPHAGUS  (DR.GESSNER PT.)      [name] [name]   History of Present Illness Visit Type: Initial Consult Primary GI MD: Stan Head MD [facility] Primary [place]: Dossie Arbour MD Requesting Dodd Schmid: Jetty Duhamel MD Chief Complaint: abnomal CT Tear in Esophagus History of Present Illness:   [place] patient of Dr. Victorino Dike, now followed by Dr. Leone Payor for BS, GERD, history of colon polyps, and [facility]. Referred here by pulmonary for abnormal esophageal findings on chest CTscan and xiphoid pain. While singing in church late June (or early July) patient felt sharp chest in pain. Pain has persistent, it is now a dulll pain. When talking he can feel air moving "up and down past that spot". Takes Prilosec twice daily plus Maalox after meals for acid reflux. No odynophagia. Occasionally food gets stuck in esophagus if eats too rapidly.   Patient gives history of "excessive stomach acid" and PUD. Sounds like he had UGI series at some point. Has regular labwork at [place]. Last EGD was in 2005 [facility]) with finding of duodenitis.        GI Review of Systems    Reports belching, chest pain, and  weight loss.   Weight loss of 10 pounds over 6 months.   Denies abdominal pain, acid reflux, bloating, dysphagia with liquids, dysphagia with solids, heartburn, loss of appetite, nausea, vomiting, and  vomiting blood.        Denies anal fissure, black tarry stools, change in bowel habit, constipation, diarrhea, diverticulosis, fecal incontinence, heme positive stool, hemorrhoids, irritable bowel syndrome, jaundice, light color stool, liver problems, rectal bleeding, and  rectal pain.    Current Medications (verified): 1)  Flunisolide 29 Mcg/act  Soln (Flunisolide) .... Use As Needed 2)  Omeprazole 20 Mg  Cpdr (Omeprazole) .... Take 1 Tablet By Mouth Two Times A Day 3)  Librax 2.5-5 Mg  Caps (Clidinium-Chlordiazepoxide) .... Take 1 To 2 Tabs By Mouth Before Meals and At Bedtime. 4)   Acular 0.5 % Soln (Ketorolac Tromethamine) .... Use As Directed 5)  Cpap 10 Cwp Advanced 6)  Pravachol 10 Mg Tabs (Pravastatin Sodium) .... Take 1 By Mouth Once Daily 7)  Asap Solution .... 2 Teaspoons (Hold Under Tongue For 30 Seconds) Then Swallow Once Daily  Allergies (verified): 1)  ! Penicillin 2)  ! Amoxicillin  Past History:  Past Medical History: Reviewed history from 12/30/2009 and no changes required. DEPRESSION (ICD-311) ANXIETY (ICD-300.00) GERD (ICD-530.81) ALLERGIC RHINITIS (ICD-477.9)- stopped allergy vaccine 2008 OBSTRUCTIVE SLEEP APNEA (ICD-327.23) ALLERGIC CONJUNCTIVITIS (ICD-372.14) POLYP, COLON (ICD-211.3) IBS (ICD-564.1)    Past Surgical History: Reviewed history from 12/31/2008 and no changes required. Tonsillectomy Appendectomy- ruptured  Family History: Reviewed history from 12/31/2008 and no changes required. Father- died colon cancer, Black Lung coal miner [place]- died pneumonia, MI, CVA  Social History: Reviewed history from 12/20/2007 and no changes required. Patient never smoked.   Review of Systems  The patient denies allergy/sinus, anemia, anxiety-new, arthritis/joint pain, back pain, blood in urine, breast changes/lumps, change in vision, confusion, cough, coughing up blood, depression-new, fainting, fatigue, fever, headaches-new, hearing problems, heart murmur, heart rhythm changes, itching, menstrual pain, muscle pains/cramps, night sweats, nosebleeds, pregnancy symptoms, shortness of breath, skin rash, sleeping problems, sore throat, swelling of feet/legs, swollen lymph glands, thirst - excessive , urination - excessive , urination changes/pain, urine leakage, vision changes, and voice change.    Physical Exam  General:  Well developed, well nourished, no acute distress. Head:  Normocephalic and atraumatic.  Eyes:  Conjunctiva pink, no icterus.  Mouth:  No oral lesions. Tongue moist.  Neck:  no obvious masses  Lungs:  Clear  throughout to auscultation. Heart:  Regular rate and rhythm; no murmurs, rubs,  or bruits. Abdomen:  Abdomen soft, nontender, nondistended. No obvious masses or hepatomegaly.Normal bowel sounds.  Extremities:  No palmar erythema, no edema.  Neurologic:  Alert and  oriented x4;  grossly normal neurologically. Skin:  Intact without significant lesions or rashes. Cervical Nodes:  No significant cervical adenopathy. Psych:  Alert and cooperative. Normal mood and affect.   Impression & Recommendations:  Problem # 1:  CHEST PAIN, ATYPICAL (ICD-786.59) Assessment Deteriorated Approx. two month history of constant nagging pain in area of distal sternum, becomes sharp pain when attempts to reach a high note singing. Can feel air moving across what feels like a hole esoinside.  Chest pain doesn't sound GI related, especially given that patient is on a PPI twice daily. That being said, chest CT scan does show dilated and patulous esophagus with air-fluid level within the distal portion of the esophagus. Additionally, patient is having some degree of solid food dysphagia. Further evaluation to include EGD with biopsies/ esophageal dilation ( if indicated).  The risks and benefits of the procedure, as well as alternatives were discussed with the patient and he agrees to proceed.    Orders: EGD (EGD)  Problem # 2:  GERD (ICD-530.81) Assessment: Unchanged On twice daily PPI plus Maalox after meals to prevent pyrosis.  Orders: EGD (EGD)  Problem # 3:  DYSPHAGIA (ICD-787.29) Assessment: Comment Only Solid food dysphagia but only when he eats too quickly. Orders: EGD (EGD)  Problem # 4:  POLYP, COLON (ICD-211.3) Assessment: Comment Only Adenomatous polyp in 2005, no polyps in 2008.  Patient Instructions: 1)  We have scheduled the Endoscopy witn possible Diltation with Dr. Stan Head for 04-29-10. 2)  Directions and brochure provided. 3)  [facility] Patient Information Guide  given to patient. 4)  Copy sent to : Jarome Matin, MD 5)                         Jetty Duhamel, MD 6)  The medication list was reviewed and reconciled.  All changed / newly prescribed medications were explained.  A complete medication list was provided to the patient / caregiver.

## 2010-10-09 NOTE — Assessment & Plan Note (Signed)
Summary: ? bronch/pt says he feels like there is a hole in trachia/apc   Primary Provider/Referring Provider:  Dossie Arbour  CC:  Trouble with "wind pipe"; "feels air going through it"..  History of Present Illness: . December 30, 2009- Allergic rhinoconjunctivitis, OSA He still wears wrap around sun glasses to keep draft out of his eyes. Wears mask outside in Spring. Acular drops stop the itch in his eyes. We compared dry eye with allergic iritation. Uses Systane. Never breathing problems- sings regularly.  CPAP- used all night every night at 10 with good control. Weight loss with dieting as he tries to manage his ulcer disease.  March 20, 2010- Allergic rhinoconjunctivitis, OSA............................Marland Kitchenwife here He sang a long string of songs 2 weeks ago at church, and seems to question if he strained his chest.. Has been getting weak, feeling irritated deep to his mid chest. Denies exertional or pleuritic pain. He has a significant past hx of peptic ulcer disease and admits his discomfort feels a little like that at his xiphoid. Has been taking a "silver solution" patent medicine which seems to make him feel beter.  April 11, 2010-Allergic rhinoconjunctivitis, OSA, chest pain..................Marland Kitchenwife here They say he is no better. Ok when he wakes, but as he talks in the morning, he begins to hurt at xiphoid. Dr Eloise Harman did CXR "clear" and gave him magic mouthwash. He saw Dr Jearld Fenton for ENT- intended because he had been complaining of a change in his voice/ throat with difficulty reaching high notes. Dr Jearld Fenton told him vocal cords looked ok. He has been taking maalox regularly after every meal. Eating doesn't affect this xiphoid pain. He denies duplication of pain by thumb pressure on xiphoid or bilateral rib compression.   Preventive Screening-Counseling & Management  Alcohol-Tobacco     Smoking Status: never  Current Medications (verified): 1)  Flunisolide 29 Mcg/act  Soln (Flunisolide)  .... Use As Needed 2)  Omeprazole 20 Mg  Cpdr (Omeprazole) .... Take 1 Tablet By Mouth Three Times A Day 3)  Librax 2.5-5 Mg  Caps (Clidinium-Chlordiazepoxide) .... Take 1 To 2 Tabs By Mouth Before Meals and At Bedtime. 4)  Acular 0.5 % Soln (Ketorolac Tromethamine) .... Use As Directed 5)  Cpap 10 Cwp Advanced 6)  Pravachol 10 Mg Tabs (Pravastatin Sodium) .... Take 1 By Mouth Once Daily 7)  Asap Solution .... 2 Teaspoons (Hold Under Tongue For 30 Seconds) Then Swallow Once Daily  Allergies (verified): 1)  ! Penicillin 2)  ! 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   Risk Factors: Smoking Status: never (04/11/2010)  Review of Systems      See HPI       The patient complains of chest pain.  The patient denies shortness of breath with activity, shortness of breath at rest, productive cough, non-productive cough, coughing up blood, irregular heartbeats, acid heartburn, indigestion, loss of appetite, weight change, abdominal pain, difficulty swallowing, sore throat, tooth/dental problems, headaches, nasal congestion/difficulty breathing through nose, and sneezing.    Vital Signs:  Patient profile:   77 year old male Height:  73 inches Weight:      187.13 pounds BMI:     24.78 O2 Sat:      98 % on Room air Pulse rate:   57 / minute BP sitting:   130 / 80  (left arm) Cuff size:   regular  Vitals Entered By: Reynaldo Minium CMA  (April 11, 2010 10:05 AM)  O2 Flow:  Room air CC: Trouble with "wind pipe"; "feels air going through it".   Physical Exam  Additional Exam:  General: A/Ox3; pleasant and cooperative, NAD, WDWN SKIN: no rash, lesions NODES: no lymphadenopathy HEENT: [place]/AT, EOM- WNL, Conjuctivae- clear, PERRLA, TM-WNL, Nose- clear, Throat- clear and wnl, Mallampati II-III NECK: Supple w/ fair ROM, JVD- none, normal carotid impulses w/o bruits Thyroid- normal to palpation CHEST: Clear to P&A. Minimal tenderness to light pressure on lower third of sternum HEART: RRR, no m/g/r heard ABDOMEN: Soft and nl; ZOX:WRUE, nl pulses, no edema  NEURO: Grossly intact to observation      Impression & Recommendations:  Problem # 1:  CHEST PAIN, ATYPICAL (ICD-786.59)  He points to the xiphoid and talks about discomfort starting as he strained to reach a high note, suggesting a mechanical change- perhaps cracked bone. torn muscle. He is fixated on this issue. I think I can cover the most ground with a CT w/ contrast. Consider bone scan  or sternal xray. He tends to get anxious and that may magnify his complaint. Watch for esophageal problem like Mallory-Weiss.  Problem # 2:  OBSTRUCTIVE SLEEP APNEA (ICD-327.23)  Able to continue CPAP  Other Orders: Est. Patient Level III (45409) TLB-BMP (Basic Metabolic Panel-BMET) (80048-METABOL) Radiology Referral (Radiology) Est. Patient Level IV (81191)  Patient Instructions: 1)  Please schedule a follow-up appointment in 3 weeks. 2)  A Chest CT with Contrast has been recommended.  Your imaging study may require preauthorization.  3)  Copy sent to: Dr Dossie Arbour 4)  Lab 5)  ............................................................................................ 6)  CT CHEST W/CM - [id number] 7)    8)  Clinical Data: , shortness of breath and sub xyphoid pain 9)    10)  CT CHEST WITH CONTRAST 11)    12)  Technique:  Multidetector CT imaging of the chest was  performed 13)  following the standard protocol during bolus administration of 14)  intravenous contrast. 15)    16)  Contrast: 80 ml of omni 300 17)    18)  Comparison: None 19)    20)  Findings: No enlarged axillary or supraclavicular lymph nodes. 21)    22)  There is no enlarged mediastinal or hilar lymph nodes. 23)    24)  No pericardial or pleural effusion identified. 25)    26)  Dilated and patulous esophagus is identified. 27)    28)  There is an air-fluid level within the distal portion of the 29)  esophagus. 30)    31)  Pulmonary arteries are patent. 32)    33)  The right lung appears clear. 34)    35)  The left lung is also clear.  No airspace consolidation or 36)  atelectasis noted.  No pulmonary parenchymal nodules or masses 37)  identified. 38)    39)  Review of the visualized osseous structures shows multilevel 40)  spondylosis.  There are no acute bony abnormalities identified.The 41)  adrenal glands both appear normal. 42)    43)  No focal abnormalities are identified within the limited images 44)  through the upperabdomen. 45)  46)  IMPRESSION: 47)    48)  1.  No acute findings identified 49)  2.  Dilated and patulous esophagus with air-fluid level.  Consider 50)  further evaluation with endoscopy or esophagram. 51)    52)  Read By:  Rosealee Albee,  M.D.     53)  Released By:  Rosealee Albee,  M.D.

## 2011-01-20 NOTE — Assessment & Plan Note (Signed)
[facility]                         GASTROENTEROLOGY OFFICE NOTE   BRUIN, BOLGER                  MRN:          [id number]  DATE:03/21/2007                            DOB:          05/11/1928    CHIEF COMPLAINT:  1. Followup of irritable bowel syndrome.  2. Refill medications.   PROBLEMS:  1. Irritable bowel syndrome.  2. Family history of colon cancer, last colonoscopy 2005 with      diverticulosis, no polyps.  3. Apparent history of polyps, could have been hyperplastic in the      past.  4. Allergic rhinitis.  5. Allergic conjunctivitis.  6. Obstructive sleep apnea, on continuous positive airway pressure.  7. Dyslipidemia.  8. Gastroesophageal reflux disease and hiatal hernia.  9. Anxiety and depression.  10.Personal history of peptic ulcer disease many years ago.   ALLERGIES:  1. PENICILLIN.  2. AMOXICILLIN.   There is a personal history of adenomatous polyps.  In fact, he had an  11 mm and a 6 mm polyp removed in June of 2005.  I did not realize that  until he left the office, as the entire report was not in the chart.  I  meant to talk to him about that before he left but I did not.   MEDICATIONS:  Listed and reviewed in the chart.  He is on:  1. Omeprazole 20 mg daily from the [place] which works well for his reflux.  2. Flunisolide p.r.n.  3. Ketorolac eye drops p.r.n. (Acular).  4. Librax generic p.r.n., 1-2 before meals and at bedtime.  5. Amitriptyline/perphenazine 10/2 at bedtime.  6. CPAP 10, advanced.  7. Tylenol p.r.n.  8. Benadryl p.r.n.   The patient is not having any particular problems.  He does not really  have any dysphagia though he has had a dilation in the past it sounds  like.  He modifies his diet well, his bowel movements are okay as long  as he is on these medications.   PHYSICAL EXAM:  Weight 203 pounds, pulse 72, blood pressure 132/74.   ASSESSMENT:  1. Irritable bowel syndrome, stable.  2.  Personal history of colon polyps and family history of colon      cancer.  3. History of diverticulosis.  4. History of gastroesophageal reflux disease and dysphagia, though      that managed with dietary change.   PLAN:  1. Refill Librax generic, he says [facility] not [facility] it so he wants to      go [facility], I have given him a prescription for a year.  2. Continue omeprazole.  3. I mistakingly told him he could wait 5 years from his last      colonoscopy (he was scheduled for 4), but given the size of the      polyp at 11 mm and then one at 6 mm in 2005 he is really due now      and we will contact him and straighten that out.     Iva Boop, MD,FACG  Electronically Signed    CEG/MedQ  DD: 03/21/2007  DT: 03/22/2007  Job #:  [id number]   cc:   Barry Dienes. Eloise Harman, M.D.

## 2011-01-23 NOTE — Assessment & Plan Note (Signed)
[name] [name]                             [name] [name]   [name] [name], [name] [name]                  MRN:          [id number]  DATE:12/21/2006                            DOB:          1931-07-21    PROBLEM:  1. Allergic rhinitis.  2. Allergic conjunctivitis.  3. Obstructive sleep apnea/CPAP 10 CWP.   HISTORY:  A one-year followup.  This has been a good year with no  special problems.  Now with the spring tree pollen he is wearing wrap-  around sunglasses to protect his eyes, but is having little sneezing.  No problems with his allergy vaccine.  We reviewed the vaccine goals,  risks, benefits and alternatives.  I discussed issues of administration  outside the medical office, anaphylaxis and epinephrine.  We refilled an  epinephrine pen prescription for him, but he is satisfied to continue  just as he is doing.  He continues CPAP at 10 CWP used every night,  [facility].  He has Acular eye drops but asks about alternatives  for comparison.  We discussed Optivar.   MEDICATIONS:  1. CPAP 10 CWP.  2. Tavist p.r.n.  3. Prilosec.  4. Amitriptyline.  5. Clidinium t.i.d.  6. Fluticasone nasal spray.  7. Acular eye drops, one in each eye b.i.d. p.r.n.  8. Allergy vaccine at 1:10.  9. Simvastatin 1/2 x40 mg.  10.Tylenol p.r.n.  11.Epinephrine pen available, never needed.   ALLERGIES/INTOLERANCES:  Intolerant to PENICILLIN AND AMOXICILLIN.   OBJECTIVE:  VITAL SIGNS:  Weight 205 pounds, blood pressure 108/74,  pulse regular at 70, room air saturation 98%.  CHEST:  Clear chest sounds.  HEART:  Normal heart sounds.  HEENT:  Conjunctivae are not injected.  Conjunctival mucosa does not  look irritated.  Lacrimal secretion is clear.  Nasal mucosa is not  edematous.  No polyps, no significant mucus.  No postnasal drainage.   IMPRESSION:  1. Allergic rhinitis and allergic conjunctivitis, adequately      controlled for peak spring season.  2.  Obstructive sleep apnea, well-controlled, okay on CPAP 10.   PLAN:  1. A sample of Optivar one drop in each eye daily or b.i.d. p.r.n.  2. Vaccine risk talk and epinephrine pen refill.  Options reviewed.      Environmental precautions reinforced.  3. Schedule a return in one year, or earlier p.r.n.     [name] D. Maple Hudson, MD, [name] [name], FACP  Electronically Signed    CDY/MedQ  DD: 12/21/2006  DT: 12/21/2006  Job #: [id number]   cc:   [name] [name]. [name] [name], M.D.

## 2011-01-23 NOTE — Assessment & Plan Note (Signed)
[facility]                           GASTROENTEROLOGY OFFICE NOTE   NAME:PARSONSLeevon, Upperman                  MRN:          [id number]  DATE:07/16/2006                            DOB:          12-08-1930    Hymie comes in to talk about having a colonoscopic examination.  His last  procedure was in 2001 and he was advised to have a follow up in four years  which has not, as yet, been done.  He does have a family history of colon  cancer.  In 2001, he really only had some minimal diverticular disease.  No  other significant pathology was noted.  He seems to have been doing fine.  He does have some laboratory studies that he wants [place] to have for our  records that he brought from his primary care doctor.   On physical examination today he weighs 193, blood pressure 110/68, pulse 68  and regular.  No carotid upstrokes.  Exam unremarkable.   IMPRESSION:  1. Gastroesophageal reflux disease controlled with Prilosec daily.  2. Irritable bowel syndrome with known diverticular disease and a family      history of colon cancer.  The patient uses Librax to help control this,      1-2 before meals and at bedtime.  I gave him a years supply of this by      prescription.  3. Underlying anxiety and depression.  4. Sleep apnea, he uses CPAP.   RECOMMENDATIONS:  Schedule him for a full colonoscopic examination at his  convenience in the near future.     Ulyess Mort, MD  Electronically Signed    SML/MedQ  DD: 07/16/2006  DT: 07/16/2006  Job #: [id number]

## 2011-06-24 ENCOUNTER — Encounter: Payer: Self-pay | Admitting: Internal Medicine

## 2011-06-24 ENCOUNTER — Ambulatory Visit (INDEPENDENT_AMBULATORY_CARE_PROVIDER_SITE_OTHER): Payer: Medicare Other | Admitting: Internal Medicine

## 2011-06-24 VITALS — BP 130/78 | HR 80 | Ht 73.0 in | Wt 183.2 lb

## 2011-06-24 DIAGNOSIS — J309 Allergic rhinitis, unspecified: Secondary | ICD-10-CM

## 2011-06-24 DIAGNOSIS — Z23 Encounter for immunization: Secondary | ICD-10-CM

## 2011-06-24 DIAGNOSIS — J40 Bronchitis, not specified as acute or chronic: Secondary | ICD-10-CM

## 2011-06-24 NOTE — Progress Notes (Signed)
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 This involves bending lifting and reaching. He is using Nasarel prescribed by the [facility] hospital during allergy season and wears a mask when he goes outdoors in the spring. He considers these treatments adequate since he has been off of allergy vaccine for several years. He is on Librax for nervousness, and he continues omeprazole for his GERD.  ROS See HPI Constitutional:   No-   weight loss, night sweats, fevers, chills, fatigue, lassitude. HEENT:   No-  headaches, difficulty swallowing, tooth/dental problems, sore throat,       No-  sneezing, itching, ear ache, nasal congestion, post nasal drip,  CV:  +  chest pain, orthopnea, PND, swelling in lower extremities, anasarca,dizziness, palpitations Resp: No-   shortness of breath with exertion or at rest.              No-   productive cough,  No non-productive cough,  No- coughing up of blood.              No-   change in color of mucus.  No- wheezing.   Skin: No-   rash or lesions. GI:  No-   heartburn, indigestion, abdominal pain, nausea, vomiting, diarrhea,                 change in bowel habits, loss of appetite GU: No-   dysuria, change in color of urine, no urgency or frequency.  No- flank pain. MS:  No-   joint pain or swelling.  No- decreased range of motion.  No- back pain. Neuro-     nothing unusual Psych:  No- change in mood or affect. No depression or anxiety.  No memory loss.  OBJ General- Alert, Oriented, Affect-appropriate, Distress- none acute; tall elderly man with carefully tended white hair Skin- rash-none, lesions- none, excoriation-  none Lymphadenopathy- none Head- atraumatic            Eyes- Gross vision intact, PERRLA, conjunctivae clear secretions            Ears- Hearing, canals-normal            Nose- Clear, no-Septal dev, mucus, polyps, erosion, perforation             Throat- Mallampati II , mucosa clear , drainage- none, tonsils- atrophic Neck- flexible , trachea midline, no stridor , thyroid nl, carotid no bruit Chest - symmetrical excursion , unlabored           Heart/CV- RRR , no murmur , no gallop  , no rub, nl s1 s2                           - JVD- none , edema- none, stasis changes- none, varices- none           Lung- clear to P&A, wheeze- none, cough- none , dullness-none, rub- none           Chest wall-  Abd- tender-no, distended-no, bowel sounds-present, HSM- no Br/ Gen/ Rectal- Not done, not indicated Extrem- cyanosis- none, clubbing, none, atrophy- none, strength- nl Neuro- grossly intact to observation

## 2011-06-24 NOTE — Patient Instructions (Signed)
Flu vax 

## 2011-06-26 ENCOUNTER — Encounter: Payer: Self-pay | Admitting: Internal Medicine

## 2011-06-26 NOTE — Assessment & Plan Note (Signed)
Nasarel is sufficient with rare antihistamine.

## 2011-06-26 NOTE — Assessment & Plan Note (Signed)
Well-controlled. Plan flu vaccine

## 2011-10-21 DIAGNOSIS — M25469 Effusion, unspecified knee: Secondary | ICD-10-CM | POA: Diagnosis not present

## 2011-12-15 DIAGNOSIS — L259 Unspecified contact dermatitis, unspecified cause: Secondary | ICD-10-CM | POA: Diagnosis not present

## 2011-12-29 ENCOUNTER — Telehealth: Payer: Self-pay | Admitting: Internal Medicine

## 2011-12-29 NOTE — Telephone Encounter (Signed)
I spoke with pt and he c/o watery/itchy/red/burning eyes, sneezing, runny nose, and both easrs feel full since the allergy season has started. He has been wearing a mask since the pollen has started getting worse. He is using his flunisolide nose spray and acular eye drops. Pt is wanting to come in for a "shot". Please advise CDY thanks  Allergies  Allergen Reactions  . Amoxicillin   . Penicillins

## 2011-12-29 NOTE — Telephone Encounter (Signed)
Florentina Addison- ok to work in as OV. Meanwhile he should try antihistamine like allegra.

## 2011-12-29 NOTE — Telephone Encounter (Signed)
Ok to have patient come in on Wednesday 12-30-2011 at 1115am for 1130am appt.

## 2011-12-29 NOTE — Telephone Encounter (Signed)
Spoke with pt and aware of recs and appt set for tomorrow at 11:30am. Carron Curie, CMA

## 2011-12-30 ENCOUNTER — Encounter: Payer: Self-pay | Admitting: Internal Medicine

## 2011-12-30 ENCOUNTER — Ambulatory Visit (INDEPENDENT_AMBULATORY_CARE_PROVIDER_SITE_OTHER): Payer: Medicare Other | Admitting: Internal Medicine

## 2011-12-30 VITALS — BP 128/80 | HR 66 | Ht 73.0 in | Wt 180.4 lb

## 2011-12-30 DIAGNOSIS — H1045 Other chronic allergic conjunctivitis: Secondary | ICD-10-CM | POA: Diagnosis not present

## 2011-12-30 DIAGNOSIS — J309 Allergic rhinitis, unspecified: Secondary | ICD-10-CM

## 2011-12-30 MED ORDER — METHYLPREDNISOLONE ACETATE 80 MG/ML IJ SUSP
80.0000 mg | Freq: Once | INTRAMUSCULAR | Status: AC
  Administered 2011-12-30: 80 mg via INTRAMUSCULAR

## 2011-12-30 NOTE — Patient Instructions (Signed)
Depo 80?

## 2011-12-30 NOTE — Progress Notes (Signed)
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 This involves bending lifting and reaching. He is using Nasarel prescribed by the [facility] hospital during allergy season and wears a mask when he goes outdoors in the spring. He considers these treatments adequate since he has been off of allergy vaccine for several years. He is on Librax for nervousness, and he continues omeprazole for his GERD.  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 Sneezing, nose runs, eyes burn. He is wearing masks outdoors with wraparound sunglasses.  ROS-See HPI Constitutional:   No-   weight loss, night sweats, fevers, chills, fatigue, lassitude. HEENT:   No-  headaches, difficulty swallowing, tooth/dental problems, sore throat,       +  sneezing, itching, ear ache, nasal congestion, post nasal drip,  CV:   No- chest pain, orthopnea, PND, swelling in lower extremities, anasarca,dizziness, palpitations Resp: No-   shortness of breath with exertion or at rest.              No-   productive cough,  No non-productive cough,  No- coughing up of blood.              No-   change in color of mucus.  No- wheezing.   Skin: No-   rash or lesions. GI:  No-   heartburn, indigestion, abdominal pain, nausea, vomiting, diarrhea,                 change in bowel habits, loss of appetite GU:  MS:  No-   joint pain or swelling. . Neuro-     nothing unusual Psych:  No- change in mood or affect. No depression or anxiety.  No memory loss.  OBJ General- Alert, Oriented, Affect-appropriate,  Distress- none acute; tall, thin, elderly man with carefully tended white hair Skin- rash-none, lesions- none, excoriation- none Lymphadenopathy- none Head- atraumatic            Eyes- Gross vision intact, PERRLA, conjunctivae clear secretions            Ears- Hearing, canals-normal            Nose- Clear, no-Septal dev, mucus, polyps, erosion, perforation             Throat- Mallampati II , mucosa clear , drainage- none, tonsils- atrophic Neck- flexible , trachea midline, no stridor , thyroid nl, carotid no bruit Chest - symmetrical excursion , unlabored           Heart/CV- RRR , no murmur , no gallop  , no rub, nl s1 s2                           - JVD- none , edema- none, stasis changes- none, varices- none           Lung- clear to P&A, wheeze- none, cough- none , dullness-none, rub- none           Chest wall-  Abd-  Br/ Gen/ Rectal- Not done, not indicated Extrem- cyanosis- none, clubbing, none, atrophy- none, strength- nl Neuro- grossly intact to  observation

## 2012-01-02 NOTE — Assessment & Plan Note (Signed)
Seasonal exacerbation due to pollen allergy. Plan-Depo-Medrol

## 2012-01-02 NOTE — Assessment & Plan Note (Signed)
Discussed OTC allergy eye drops and continued use of sunglasses.

## 2012-03-07 ENCOUNTER — Encounter: Payer: Self-pay | Admitting: Internal Medicine

## 2012-03-09 DIAGNOSIS — M6740 Ganglion, unspecified site: Secondary | ICD-10-CM | POA: Diagnosis not present

## 2012-03-14 ENCOUNTER — Encounter: Payer: Self-pay | Admitting: Internal Medicine

## 2012-03-28 DIAGNOSIS — K219 Gastro-esophageal reflux disease without esophagitis: Secondary | ICD-10-CM | POA: Diagnosis not present

## 2012-03-28 DIAGNOSIS — H6120 Impacted cerumen, unspecified ear: Secondary | ICD-10-CM | POA: Diagnosis not present

## 2012-03-28 DIAGNOSIS — I10 Essential (primary) hypertension: Secondary | ICD-10-CM | POA: Diagnosis not present

## 2012-03-28 DIAGNOSIS — E785 Hyperlipidemia, unspecified: Secondary | ICD-10-CM | POA: Diagnosis not present

## 2012-03-30 DIAGNOSIS — H6120 Impacted cerumen, unspecified ear: Secondary | ICD-10-CM | POA: Diagnosis not present

## 2012-03-30 DIAGNOSIS — H93299 Other abnormal auditory perceptions, unspecified ear: Secondary | ICD-10-CM | POA: Diagnosis not present

## 2012-03-30 DIAGNOSIS — H9319 Tinnitus, unspecified ear: Secondary | ICD-10-CM | POA: Diagnosis not present

## 2012-04-05 DIAGNOSIS — H9319 Tinnitus, unspecified ear: Secondary | ICD-10-CM | POA: Diagnosis not present

## 2012-04-05 DIAGNOSIS — H903 Sensorineural hearing loss, bilateral: Secondary | ICD-10-CM | POA: Diagnosis not present

## 2012-04-05 DIAGNOSIS — H905 Unspecified sensorineural hearing loss: Secondary | ICD-10-CM | POA: Diagnosis not present

## 2012-04-14 ENCOUNTER — Ambulatory Visit (AMBULATORY_SURGERY_CENTER): Payer: Medicare Other | Admitting: *Deleted

## 2012-04-14 VITALS — Ht 73.5 in | Wt 175.0 lb

## 2012-04-14 DIAGNOSIS — Z1211 Encounter for screening for malignant neoplasm of colon: Secondary | ICD-10-CM

## 2012-04-14 MED ORDER — MOVIPREP 100 G PO SOLR: ORAL | Status: DC

## 2012-04-22 ENCOUNTER — Telehealth: Payer: Self-pay | Admitting: Internal Medicine

## 2012-04-25 NOTE — Telephone Encounter (Signed)
Patient requesting reglan to help him with his prep.  Can we send him in some reglan for his prep?

## 2012-04-25 NOTE — Telephone Encounter (Signed)
Reglan 10 mg 1 before each phase of Moviprep # 2 no refills

## 2012-04-26 MED ORDER — METOCLOPRAMIDE HCL 10 MG PO TABS: ORAL_TABLET | ORAL | Status: DC

## 2012-04-26 NOTE — Telephone Encounter (Signed)
Patient advised.

## 2012-04-28 ENCOUNTER — Ambulatory Visit (AMBULATORY_SURGERY_CENTER): Payer: Medicare Other | Admitting: Internal Medicine

## 2012-04-28 ENCOUNTER — Encounter: Payer: Self-pay | Admitting: Internal Medicine

## 2012-04-28 VITALS — BP 118/63 | HR 64 | Temp 97.2°F | Resp 16 | Ht 73.0 in | Wt 175.0 lb

## 2012-04-28 DIAGNOSIS — F341 Dysthymic disorder: Secondary | ICD-10-CM | POA: Diagnosis not present

## 2012-04-28 DIAGNOSIS — Z1211 Encounter for screening for malignant neoplasm of colon: Secondary | ICD-10-CM

## 2012-04-28 DIAGNOSIS — Z86010 Personal history of colonic polyps: Secondary | ICD-10-CM | POA: Diagnosis not present

## 2012-04-28 DIAGNOSIS — D128 Benign neoplasm of rectum: Secondary | ICD-10-CM

## 2012-04-28 DIAGNOSIS — K219 Gastro-esophageal reflux disease without esophagitis: Secondary | ICD-10-CM | POA: Diagnosis not present

## 2012-04-28 DIAGNOSIS — G8389 Other specified paralytic syndromes: Secondary | ICD-10-CM | POA: Diagnosis not present

## 2012-04-28 DIAGNOSIS — J40 Bronchitis, not specified as acute or chronic: Secondary | ICD-10-CM | POA: Diagnosis not present

## 2012-04-28 DIAGNOSIS — G4733 Obstructive sleep apnea (adult) (pediatric): Secondary | ICD-10-CM | POA: Diagnosis not present

## 2012-04-28 DIAGNOSIS — D129 Benign neoplasm of anus and anal canal: Secondary | ICD-10-CM | POA: Diagnosis not present

## 2012-04-28 DIAGNOSIS — D126 Benign neoplasm of colon, unspecified: Secondary | ICD-10-CM

## 2012-04-28 DIAGNOSIS — K649 Unspecified hemorrhoids: Secondary | ICD-10-CM

## 2012-04-28 DIAGNOSIS — K621 Rectal polyp: Secondary | ICD-10-CM

## 2012-04-28 MED ORDER — SODIUM CHLORIDE 0.9 % IV SOLN: 500.0000 mL | INTRAVENOUS | Status: DC

## 2012-04-28 NOTE — Progress Notes (Addendum)
Patient did not have preoperative order for IV antibiotic SSI prophylaxis. (G8918)  Patient did not experience any of the following events: a burn prior to discharge; a fall within the facility; wrong site/side/patient/procedure/implant event; or a hospital transfer or hospital admission upon discharge from the facility. (G8907)  

## 2012-04-28 NOTE — Op Note (Signed)
[facility] [address] [place], [address]   COLONOSCOPY PROCEDURE REPORT  PATIENT: [name], [name]  MR#: [id number] BIRTHDATE: 05-Oct-1930 , 80  yrs. old GENDER: Male ENDOSCOPIST: Iva Boop, MD, [facility] REFERRED BY: PROCEDURE DATE:  04/28/2012 PROCEDURE:   Colonoscopy with snare polypectomy ASA CLASS:   Class III INDICATIONS:screening and surveillance,personal history of colonic polyps and patient's immediate family history of colon cancer. MEDICATIONS: propofol (Diprivan) 150mg  IV, MAC sedation, administered by CRNA, and These medications were titrated to patient response per physician's verbal order  DESCRIPTION OF PROCEDURE:   After the risks benefits and alternatives of the procedure were thoroughly explained, informed consent was obtained.  A digital rectal exam revealed no abnormalities of the rectum.   The LB [id number]  endoscope was introduced through the anus and advanced to the cecum, which was identified by both the appendix and ileocecal valve. No adverse events experienced.   The quality of the prep was excellent, using MoviPrep  The instrument was then slowly withdrawn as the colon was fully examined.      COLON FINDINGS: A pedunculated polyp measuring 7 mm in size was found in the rectum.  A polypectomy was performed using snare cautery.  The resection was complete and the polyp tissue was completely retrieved.   Moderate sized internal and external hemorrhoids were found.   The colon mucosa was otherwise normal. Retroflexed views revealed internal/external hemorrhoids. The time to cecum=5 minutes 23 seconds  Withdrawal time=9 minutes 45 seconds.  The scope was withdrawn and the procedure completed. COMPLICATIONS: There were no complications. ENDOSCOPIC IMPRESSION: 1.   Pedunculated polyp measuring 7 mm in size was found in the rectum; polypectomy was performed using snare cautery 2.   Moderate sized internal and external  hemorrhoids 3.   The colon mucosa was otherwise normal 4.   Peresonal history of colon polyps and family history of colon cancer (elderly mother and father)  RECOMMENDATIONS: 1.  Hold aspirin, aspirin products, and anti-inflammatory medication for 1 week. restart ok 8/29 2.  Timing of repeat colonoscopy will be determined by pathology findings.  eSigned:  Iva Boop, MD, [facility] 04/28/2012 11:48 AM   cc: The Patient   PATIENT NAME:  [name], [name] MR#: [id number]

## 2012-04-28 NOTE — Patient Instructions (Addendum)
One small polyp was removed from the rectum, and hemorrhoids were also seen.  Thank you for choosing me and [facility].  Iva Boop, MD, FACG YOU HAD AN ENDOSCOPIC PROCEDURE TODAY AT THE [facility]: Refer to the procedure report that was given to you for any specific questions about what was found during the examination.  If the procedure report does not answer your questions, please call your gastroenterologist to clarify.  If you requested that your care partner not be given the details of your procedure findings, then the procedure report has been included in a sealed envelope for you to review at your convenience later.  YOU SHOULD EXPECT: Some feelings of bloating in the abdomen. Passage of more gas than usual.  Walking can help get rid of the air that was put into your GI tract during the procedure and reduce the bloating. If you had a lower endoscopy (such as a colonoscopy or flexible sigmoidoscopy) you may notice spotting of blood in your stool or on the toilet paper. If you underwent a bowel prep for your procedure, then you may not have a normal bowel movement for a few days.  DIET: Your first meal following the procedure should be a light meal and then it is ok to progress to your normal diet.  A half-sandwich or bowl of soup is an example of a good first meal.  Heavy or fried foods are harder to digest and may make you feel nauseous or bloated.  Likewise meals heavy in dairy and vegetables can cause extra gas to form and this can also increase the bloating.  Drink plenty of fluids but you should avoid alcoholic beverages for 24 hours.  ACTIVITY: Your care partner should take you home directly after the procedure.  You should plan to take it easy, moving slowly for the rest of the day.  You can resume normal activity the day after the procedure however you should NOT DRIVE or use heavy machinery for 24 hours (because of the sedation medicines used during the  test).    SYMPTOMS TO REPORT IMMEDIATELY: A gastroenterologist can be reached at any hour.  During normal business hours, 8:30 AM to 5:00 PM Monday through Friday, call [phone].  After hours and on weekends, please call the GI answering service at [phone] who will take a message and have the physician on call contact you.   Following lower endoscopy (colonoscopy or flexible sigmoidoscopy):  Excessive amounts of blood in the stool  Significant tenderness or worsening of abdominal pains  Swelling of the abdomen that is new, acute  Fever of 100F or higher  FOLLOW UP: If any biopsies were taken you will be contacted by phone or by letter within the next 1-3 weeks.  Call your gastroenterologist if you have not heard about the biopsies in 3 weeks.  Our staff will call the home number listed on your records the next business day following your procedure to check on you and address any questions or concerns that you may have at that time regarding the information given to you following your procedure. This is a courtesy call and so if there is no answer at the home number and we have not heard from you through the emergency physician on call, we will assume that you have returned to your regular daily activities without incident.  SIGNATURES/CONFIDENTIALITY: You and/or your care partner have signed paperwork which will be entered into your electronic medical record.  These signatures  attest to the fact that that the information above on your After Visit Summary has been reviewed and is understood.  Full responsibility of the confidentiality of this discharge information lies with you and/or your care-partner.

## 2012-04-29 ENCOUNTER — Telehealth: Payer: Self-pay | Admitting: *Deleted

## 2012-04-29 NOTE — Telephone Encounter (Signed)
  Follow up Call-  Call back number 04/28/2012  Post procedure Call Back phone  # [phone]  Permission to leave phone message Yes     Patient questions:  Do you have a fever, pain , or abdominal swelling? no Pain Score  0 *  Have you tolerated food without any problems? yes  Have you been able to return to your normal activities? yes  Do you have any questions about your discharge instructions: Diet   no Medications  no Follow up visit  no  Do you have questions or concerns about your Care? no  Actions: * If pain score is 4 or above: No action needed, pain <4.

## 2012-05-09 ENCOUNTER — Encounter: Payer: Self-pay | Admitting: Internal Medicine

## 2012-05-09 NOTE — Progress Notes (Signed)
Quick Note:  7 mm adenoma Possible repeat colon 05/2017 at age 78 ______

## 2012-05-25 DIAGNOSIS — Z961 Presence of intraocular lens: Secondary | ICD-10-CM | POA: Diagnosis not present

## 2012-05-25 DIAGNOSIS — H40019 Open angle with borderline findings, low risk, unspecified eye: Secondary | ICD-10-CM | POA: Diagnosis not present

## 2012-05-25 DIAGNOSIS — H43819 Vitreous degeneration, unspecified eye: Secondary | ICD-10-CM | POA: Diagnosis not present

## 2012-05-25 DIAGNOSIS — H35379 Puckering of macula, unspecified eye: Secondary | ICD-10-CM | POA: Diagnosis not present

## 2012-05-25 DIAGNOSIS — H43399 Other vitreous opacities, unspecified eye: Secondary | ICD-10-CM | POA: Diagnosis not present

## 2012-06-02 ENCOUNTER — Telehealth: Payer: Self-pay | Admitting: Internal Medicine

## 2012-06-02 DIAGNOSIS — G4733 Obstructive sleep apnea (adult) (pediatric): Secondary | ICD-10-CM

## 2012-06-02 NOTE — Telephone Encounter (Signed)
Order DME- reduce CPAP from 10 to 8 cwp    Dx OSA

## 2012-06-02 NOTE — Telephone Encounter (Signed)
Pt aware order has been placed to [facility]. He would like this faxed over to the one on [address] so he can take his machine today. Please advise [facility] thanks

## 2012-06-02 NOTE — Telephone Encounter (Signed)
Called and spoke with patient. I faxed an order over to [facility] to the [facility] to reduce cpap pressure to 8 cwp. I also sent Mayra Reel a message to ask her to let the [facility] know that the patient wanted to bring his cpap over today to have it adjusted. Rhonda J Cobb

## 2012-06-02 NOTE — Telephone Encounter (Signed)
I spoke with pt and she believes his cpap pressure is too high. C/o a lot of air building up in the mask and causing his ears to "pop". He is current;y at setting of 10. He is requesting order to be sent to [facility] for this since he has also lost weight. Please advise Dr. Maple Hudson thanks

## 2012-06-23 ENCOUNTER — Ambulatory Visit: Payer: Medicare Other | Admitting: Internal Medicine

## 2012-07-25 DIAGNOSIS — M25519 Pain in unspecified shoulder: Secondary | ICD-10-CM | POA: Diagnosis not present

## 2012-07-28 DIAGNOSIS — I10 Essential (primary) hypertension: Secondary | ICD-10-CM | POA: Diagnosis not present

## 2012-07-28 DIAGNOSIS — M26629 Arthralgia of temporomandibular joint, unspecified side: Secondary | ICD-10-CM | POA: Diagnosis not present

## 2012-07-28 DIAGNOSIS — Z23 Encounter for immunization: Secondary | ICD-10-CM | POA: Diagnosis not present

## 2012-10-03 DIAGNOSIS — H40019 Open angle with borderline findings, low risk, unspecified eye: Secondary | ICD-10-CM | POA: Diagnosis not present

## 2012-10-03 DIAGNOSIS — H1045 Other chronic allergic conjunctivitis: Secondary | ICD-10-CM | POA: Diagnosis not present

## 2012-10-03 DIAGNOSIS — H02059 Trichiasis without entropian unspecified eye, unspecified eyelid: Secondary | ICD-10-CM | POA: Diagnosis not present

## 2012-10-03 DIAGNOSIS — H04129 Dry eye syndrome of unspecified lacrimal gland: Secondary | ICD-10-CM | POA: Diagnosis not present

## 2012-11-17 DIAGNOSIS — M26629 Arthralgia of temporomandibular joint, unspecified side: Secondary | ICD-10-CM | POA: Diagnosis not present

## 2012-11-17 DIAGNOSIS — M26639 Articular disc disorder of temporomandibular joint, unspecified side: Secondary | ICD-10-CM | POA: Diagnosis not present

## 2012-11-21 DIAGNOSIS — M26629 Arthralgia of temporomandibular joint, unspecified side: Secondary | ICD-10-CM | POA: Diagnosis not present

## 2012-12-06 DIAGNOSIS — M26639 Articular disc disorder of temporomandibular joint, unspecified side: Secondary | ICD-10-CM | POA: Diagnosis not present

## 2013-02-09 ENCOUNTER — Ambulatory Visit (INDEPENDENT_AMBULATORY_CARE_PROVIDER_SITE_OTHER): Payer: Medicare Other | Admitting: Internal Medicine

## 2013-02-09 ENCOUNTER — Encounter: Payer: Self-pay | Admitting: Internal Medicine

## 2013-02-09 VITALS — BP 120/72 | HR 87 | Ht 73.0 in | Wt 169.0 lb

## 2013-02-09 DIAGNOSIS — G4733 Obstructive sleep apnea (adult) (pediatric): Secondary | ICD-10-CM | POA: Diagnosis not present

## 2013-02-09 DIAGNOSIS — H1045 Other chronic allergic conjunctivitis: Secondary | ICD-10-CM | POA: Diagnosis not present

## 2013-02-09 DIAGNOSIS — J302 Other seasonal allergic rhinitis: Secondary | ICD-10-CM

## 2013-02-09 DIAGNOSIS — J309 Allergic rhinitis, unspecified: Secondary | ICD-10-CM | POA: Diagnosis not present

## 2013-02-09 NOTE — Patient Instructions (Addendum)
I'm glad you are doing well. Please call as needed

## 2013-02-09 NOTE — Progress Notes (Signed)
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 This involves bending lifting and reaching. He is using Nasarel prescribed by the [facility] hospital during allergy season and wears a mask when he goes outdoors in the spring. He considers these treatments adequate since he has been off of allergy vaccine for several years. He is on Librax for nervousness, and he continues omeprazole for his GERD.  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 Sneezing, nose runs, eyes burn. He is wearing masks outdoors with wraparound sunglasses.  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 He now gets others to cut his lawn. Using Acular drops, Nasarel, Zyrtec. Says he is no longer needing CPAP since his weight loss. Once his chest wall strain syndrome had resolved last year, he has been able to sing normally.  ROS-See HPI Constitutional:   No-   weight loss, night sweats, fevers, chills, fatigue, lassitude. HEENT:   No-  headaches, difficulty swallowing, tooth/dental problems, sore throat,       +  sneezing, itching, ear ache, nasal congestion, post nasal drip,  CV:   No- chest pain, orthopnea, PND, swelling in lower extremities, anasarca,dizziness, palpitations Resp: No-   shortness of breath with exertion or at rest.              No-    productive cough,  No non-productive cough,  No- coughing up of blood.              No-   change in color of mucus.  No- wheezing.   Skin: No-   rash or lesions. GI:  No-   heartburn, indigestion, abdominal pain, nausea, vomiting,  GU:  MS:  No-   joint pain or swelling. . Neuro-     nothing unusual Psych:  No- change in mood or affect. No depression or anxiety.  No memory loss.  OBJ General- Alert, Oriented, Affect-appropriate, Distress- none acute; tall, thin, elderly man with carefully tended white hair Skin- rash-none, lesions- none, excoriation- none Lymphadenopathy- none Head- atraumatic            Eyes- Gross vision intact, PERRLA, conjunctivae clear secretions            Ears- Hearing, canals-normal            Nose- Clear, no-Septal dev, mucus, polyps, erosion, perforation             Throat- Mallampati II , mucosa clear , drainage- none, tonsils- atrophic Neck- flexible , trachea midline, no stridor , thyroid nl, carotid no bruit Chest - symmetrical excursion , unlabored           Heart/CV- RRR , no murmur , no gallop  , no rub, nl s1 s2                           - JVD- none , edema- none, stasis changes-  none, varices- none           Lung- clear to P&A, wheeze- none, cough- none , dullness-none, rub- none           Chest wall-  Abd-  Br/ Gen/ Rectal- Not done, not indicated Extrem- cyanosis- none, clubbing, none, atrophy- none, strength- nl Neuro- grossly intact to observation

## 2013-02-23 NOTE — Assessment & Plan Note (Signed)
He is feeling much more comfortably controlled except in peak allergy season, when he wears a mask or stays indoors. He will continue his nasal steroid and his antihistamines.

## 2013-02-23 NOTE — Assessment & Plan Note (Signed)
Okay to continue Acular eyedrops as needed

## 2013-02-23 NOTE — Assessment & Plan Note (Signed)
He reports this problem is inactive after weight loss and no longer requires CPAP.

## 2013-03-29 DIAGNOSIS — IMO0002 Reserved for concepts with insufficient information to code with codable children: Secondary | ICD-10-CM | POA: Diagnosis not present

## 2013-03-29 DIAGNOSIS — E785 Hyperlipidemia, unspecified: Secondary | ICD-10-CM | POA: Diagnosis not present

## 2013-03-29 DIAGNOSIS — Z1331 Encounter for screening for depression: Secondary | ICD-10-CM | POA: Diagnosis not present

## 2013-03-29 DIAGNOSIS — K219 Gastro-esophageal reflux disease without esophagitis: Secondary | ICD-10-CM | POA: Diagnosis not present

## 2013-03-29 DIAGNOSIS — K589 Irritable bowel syndrome without diarrhea: Secondary | ICD-10-CM | POA: Diagnosis not present

## 2013-03-29 DIAGNOSIS — M899 Disorder of bone, unspecified: Secondary | ICD-10-CM | POA: Diagnosis not present

## 2013-03-29 DIAGNOSIS — M949 Disorder of cartilage, unspecified: Secondary | ICD-10-CM | POA: Diagnosis not present

## 2013-06-19 DIAGNOSIS — H43819 Vitreous degeneration, unspecified eye: Secondary | ICD-10-CM | POA: Diagnosis not present

## 2013-06-19 DIAGNOSIS — H04129 Dry eye syndrome of unspecified lacrimal gland: Secondary | ICD-10-CM | POA: Diagnosis not present

## 2013-06-19 DIAGNOSIS — Z961 Presence of intraocular lens: Secondary | ICD-10-CM | POA: Diagnosis not present

## 2013-06-19 DIAGNOSIS — H1045 Other chronic allergic conjunctivitis: Secondary | ICD-10-CM | POA: Diagnosis not present

## 2013-06-19 DIAGNOSIS — H26499 Other secondary cataract, unspecified eye: Secondary | ICD-10-CM | POA: Diagnosis not present

## 2013-06-19 DIAGNOSIS — H40019 Open angle with borderline findings, low risk, unspecified eye: Secondary | ICD-10-CM | POA: Diagnosis not present

## 2013-07-12 DIAGNOSIS — M25559 Pain in unspecified hip: Secondary | ICD-10-CM | POA: Diagnosis not present

## 2013-07-18 DIAGNOSIS — M25559 Pain in unspecified hip: Secondary | ICD-10-CM | POA: Diagnosis not present

## 2013-09-21 DIAGNOSIS — H04129 Dry eye syndrome of unspecified lacrimal gland: Secondary | ICD-10-CM | POA: Diagnosis not present

## 2013-09-21 DIAGNOSIS — H40019 Open angle with borderline findings, low risk, unspecified eye: Secondary | ICD-10-CM | POA: Diagnosis not present

## 2013-09-21 DIAGNOSIS — H18519 Endothelial corneal dystrophy, unspecified eye: Secondary | ICD-10-CM | POA: Diagnosis not present

## 2013-09-25 DIAGNOSIS — L259 Unspecified contact dermatitis, unspecified cause: Secondary | ICD-10-CM | POA: Diagnosis not present

## 2014-03-07 DIAGNOSIS — L259 Unspecified contact dermatitis, unspecified cause: Secondary | ICD-10-CM | POA: Diagnosis not present

## 2014-03-28 DIAGNOSIS — J988 Other specified respiratory disorders: Secondary | ICD-10-CM | POA: Diagnosis not present

## 2014-03-28 DIAGNOSIS — IMO0002 Reserved for concepts with insufficient information to code with codable children: Secondary | ICD-10-CM | POA: Diagnosis not present

## 2014-04-02 DIAGNOSIS — R490 Dysphonia: Secondary | ICD-10-CM | POA: Diagnosis not present

## 2014-04-02 DIAGNOSIS — I10 Essential (primary) hypertension: Secondary | ICD-10-CM | POA: Diagnosis not present

## 2014-04-02 DIAGNOSIS — R05 Cough: Secondary | ICD-10-CM | POA: Diagnosis not present

## 2014-04-02 DIAGNOSIS — R059 Cough, unspecified: Secondary | ICD-10-CM | POA: Diagnosis not present

## 2014-05-03 DIAGNOSIS — L259 Unspecified contact dermatitis, unspecified cause: Secondary | ICD-10-CM | POA: Diagnosis not present

## 2014-06-06 ENCOUNTER — Telehealth: Payer: Self-pay | Admitting: Cardiovascular Disease

## 2014-06-06 NOTE — Telephone Encounter (Signed)
New problem:   Pt's wife called and asked that pt be scheduled within the next 2wk post hosp.   I adv'd pt's wife pt would be a new pt.  Nasher did his procedure per wife.   She would like a call back with and appt.

## 2014-06-06 NOTE — Telephone Encounter (Signed)
Returned patient's call. Patient informed me that the phone call was intended to be about his wife, Nehemyah Foushee.

## 2014-06-25 DIAGNOSIS — H26491 Other secondary cataract, right eye: Secondary | ICD-10-CM | POA: Diagnosis not present

## 2014-06-25 DIAGNOSIS — H43811 Vitreous degeneration, right eye: Secondary | ICD-10-CM | POA: Diagnosis not present

## 2014-06-25 DIAGNOSIS — Z961 Presence of intraocular lens: Secondary | ICD-10-CM | POA: Diagnosis not present

## 2014-06-25 DIAGNOSIS — H40013 Open angle with borderline findings, low risk, bilateral: Secondary | ICD-10-CM | POA: Diagnosis not present

## 2014-07-13 DIAGNOSIS — L308 Other specified dermatitis: Secondary | ICD-10-CM | POA: Diagnosis not present

## 2014-07-19 DIAGNOSIS — M79645 Pain in left finger(s): Secondary | ICD-10-CM | POA: Diagnosis not present

## 2014-09-25 DIAGNOSIS — H40013 Open angle with borderline findings, low risk, bilateral: Secondary | ICD-10-CM | POA: Diagnosis not present

## 2015-01-18 DIAGNOSIS — L309 Dermatitis, unspecified: Secondary | ICD-10-CM | POA: Diagnosis not present

## 2015-05-03 DIAGNOSIS — K589 Irritable bowel syndrome without diarrhea: Secondary | ICD-10-CM | POA: Diagnosis not present

## 2015-05-03 DIAGNOSIS — I10 Essential (primary) hypertension: Secondary | ICD-10-CM | POA: Diagnosis not present

## 2015-05-03 DIAGNOSIS — E785 Hyperlipidemia, unspecified: Secondary | ICD-10-CM | POA: Diagnosis not present

## 2015-05-03 DIAGNOSIS — K219 Gastro-esophageal reflux disease without esophagitis: Secondary | ICD-10-CM | POA: Diagnosis not present

## 2015-05-03 DIAGNOSIS — Z1389 Encounter for screening for other disorder: Secondary | ICD-10-CM | POA: Diagnosis not present

## 2015-05-03 DIAGNOSIS — Z6824 Body mass index (BMI) 24.0-24.9, adult: Secondary | ICD-10-CM | POA: Diagnosis not present

## 2015-05-28 ENCOUNTER — Other Ambulatory Visit: Payer: Self-pay | Admitting: Dermatology

## 2015-05-28 DIAGNOSIS — L309 Dermatitis, unspecified: Secondary | ICD-10-CM | POA: Diagnosis not present

## 2015-05-28 DIAGNOSIS — L500 Allergic urticaria: Secondary | ICD-10-CM | POA: Diagnosis not present

## 2015-06-14 DIAGNOSIS — H43811 Vitreous degeneration, right eye: Secondary | ICD-10-CM | POA: Diagnosis not present

## 2015-06-14 DIAGNOSIS — Z961 Presence of intraocular lens: Secondary | ICD-10-CM | POA: Diagnosis not present

## 2015-06-14 DIAGNOSIS — H26491 Other secondary cataract, right eye: Secondary | ICD-10-CM | POA: Diagnosis not present

## 2015-06-14 DIAGNOSIS — H40013 Open angle with borderline findings, low risk, bilateral: Secondary | ICD-10-CM | POA: Diagnosis not present

## 2015-06-27 DIAGNOSIS — S61401A Unspecified open wound of right hand, initial encounter: Secondary | ICD-10-CM | POA: Diagnosis not present

## 2015-07-15 ENCOUNTER — Encounter: Payer: Self-pay | Admitting: Internal Medicine

## 2015-08-15 ENCOUNTER — Encounter: Payer: Self-pay | Admitting: Internal Medicine

## 2015-09-26 DIAGNOSIS — L308 Other specified dermatitis: Secondary | ICD-10-CM | POA: Diagnosis not present

## 2015-10-07 ENCOUNTER — Telehealth: Payer: Self-pay | Admitting: Internal Medicine

## 2015-10-07 NOTE — Telephone Encounter (Signed)
Spoke with pt's wife. States that pt has a rash on his back that is spreading. We haven not seen this pt since 2014. Advised her that if the pt needs to be seen ASAP they we recommend calling his PCP. She agreed and verbalized understanding. Nothing further was needed.

## 2015-10-08 ENCOUNTER — Encounter: Payer: Self-pay | Admitting: Allergy and Immunology

## 2015-10-08 ENCOUNTER — Ambulatory Visit (INDEPENDENT_AMBULATORY_CARE_PROVIDER_SITE_OTHER): Payer: Medicare Other | Admitting: Allergy and Immunology

## 2015-10-08 VITALS — BP 130/88 | HR 80 | Temp 97.5°F | Resp 16 | Ht 70.87 in | Wt 175.5 lb

## 2015-10-08 DIAGNOSIS — J302 Other seasonal allergic rhinitis: Secondary | ICD-10-CM

## 2015-10-08 DIAGNOSIS — L309 Dermatitis, unspecified: Secondary | ICD-10-CM | POA: Diagnosis not present

## 2015-10-08 MED ORDER — PREDNISONE 1 MG PO TABS: 10.0000 mg | ORAL_TABLET | ORAL | Status: DC

## 2015-10-08 MED ORDER — MOMETASONE FUROATE 0.1 % EX OINT: TOPICAL_OINTMENT | Freq: Every day | CUTANEOUS | Status: DC

## 2015-10-08 MED ORDER — HYDROXYZINE HCL 25 MG PO TABS: ORAL_TABLET | ORAL | Status: DC

## 2015-10-08 NOTE — Patient Instructions (Addendum)
Dermatitis Uncertain etiology.  Food allergen skin tests were negative today despite a positive histamine control.  A prescription has been provided for mometasone 0.1% ointment sparingly to affected areas daily as needed.  A prescription has been provided for hydroxyzine 25 mg at bedtime as needed for pruritus.  The patient has been cautioned regarding somnolence and other potential side effects.  To jumpstart symptom relief, prednisone has been provided: 10 mg daily 5 days.  I have recommended a reevaluation by the insect exterminator with insect eradication if necessary.   If symptoms persist or progress, dermatology evaluation with biopsy may be beneficial.   Seasonal allergic rhinitis  Aeroallergen avoidance measures.  Continue appropriate allergen avoidance measures and flunisolide nasal spray as needed.    Return if symptoms worsen or fail to improve.  Reducing Pollen Exposure  The American Academy of Allergy, Asthma and Immunology suggests the following steps to reduce your exposure to pollen during allergy seasons.    1. Do not hang sheets or clothing out to dry; pollen may collect on these items. 2. Do not mow lawns or spend time around freshly cut grass; mowing stirs up pollen. 3. Keep windows closed at night.  Keep car windows closed while driving. 4. Minimize morning activities outdoors, a time when pollen counts are usually at their highest. 5. Stay indoors as much as possible when pollen counts or humidity is high and on windy days when pollen tends to remain in the air longer. 6. Use air conditioning when possible.  Many air conditioners have filters that trap the pollen spores. 7. Use a HEPA room air filter to remove pollen form the indoor air you breathe.

## 2015-10-08 NOTE — Progress Notes (Signed)
New Patient Note  RE: [name] [name] MRN: [id number] DOB: 17-Aug-1931 Date of Office Visit: 10/08/2015  Referring provider: Leanna Battles, MD Primary care provider: Donnajean Lopes, MD  Chief Complaint: Rash   History of present illness: HPI Comments: [name] [name] is a 82 y.o. male who presents today for consultation of rash.  Approximately 2 or 3 weeks ago he began to develop a rash, initially on the backs of his hands, and then spreading to his forearms, abdomen, chest, and back.  He has not experienced concomitant cardiopulmonary or GI symptoms.  No specific medication or food triggers have been identified.  He reports that around the time of symptom onset he saw a tiny insects in his room.  His wife has not developed a rash, however she does not share his room.  He reports that he saw the 91 assistant at his dermatologist office, was told that he had eczema.  He was prescribed triamcinolone 0.1% cream at the [facility] clinic.  Triamcinolone has reduced the rash modestly.  Gold Bond lotion has helped to decrease pruritus.  He has a history of seasonal allergic rhinitis and has been on aeroallergen immunotherapy in the past.  He typically experiences nasal congestion, rhinorrhea, and sneezing during the spring and summertime.   Assessment and plan: Dermatitis Uncertain etiology.  Food allergen skin tests were negative today despite a positive histamine control.  A prescription has been provided for mometasone 0.1% ointment sparingly to affected areas daily as needed.  A prescription has been provided for hydroxyzine 25 mg at bedtime as needed for pruritus.  The patient has been cautioned regarding somnolence and other potential side effects.  To jumpstart symptom relief, prednisone has been provided: 10 mg daily 5 days.  I have recommended a reevaluation by the insect exterminator with insect eradication if necessary.   If symptoms persist or progress, dermatology  evaluation with biopsy may be beneficial.   Seasonal allergic rhinitis  Aeroallergen avoidance measures.  Continue appropriate allergen avoidance measures and flunisolide nasal spray as needed.    Meds ordered this encounter  Medications  . predniSONE (DELTASONE) tablet 10 mg    Sig:   . mometasone (ELOCON) 0.1 % ointment    Sig: Apply topically daily.    Dispense:  45 g    Refill:  3  . hydrOXYzine (ATARAX/VISTARIL) 25 MG tablet    Sig: Take one tablet at bedtime for itching.    Dispense:  30 tablet    Refill:  1    Diagnositics: Food allergen skin testing: Negative despite a positive histamine control.    Physical examination: Blood pressure 130/88, pulse 80, temperature 97.5 F (36.4 C), temperature source Oral, resp. rate 16, height 5' 10.87" (1.8 m), weight 175 lb 7.8 oz (79.6 kg).  General: Alert, interactive, in no acute distress. HEENT: TMs pearly gray, turbinates moderately edematous without discharge, post-pharynx mildly erythematous. Neck: Supple without lymphadenopathy. Lungs: Clear to auscultation without wheezing, rhonchi or rales. CV: Normal S1, S2 without murmurs. Abdomen: Nondistended, nontender. Skin: Dry, erythematous, excoriated patches on the hands, forearms, abdomen, chest, and upper back. Scattered erythematous papules on the upper back. Extremities:  No clubbing, cyanosis or edema. Neuro:   Grossly intact.  Review of systems: Review of Systems  Constitutional: Negative for fever, chills and weight loss.  HENT: Positive for congestion. Negative for nosebleeds.   Eyes: Negative for blurred vision.  Respiratory: Negative for hemoptysis, shortness of breath and wheezing.   Cardiovascular: Negative for chest pain.  Gastrointestinal: Negative for diarrhea and constipation.  Genitourinary: Negative for dysuria.  Musculoskeletal: Negative for myalgias and joint pain.  Skin: Positive for itching and rash.  Neurological: Negative for dizziness.    Endo/Heme/Allergies: Positive for environmental allergies. Does not bruise/bleed easily.    Past medical history: Past Medical History  Diagnosis Date  . Depressive disorder, not elsewhere classified   . Anxiety state, unspecified   . Esophageal reflux   . Allergic rhinitis, cause unspecified   . Obstructive sleep apnea (adult) (pediatric)   . Other chronic allergic conjunctivitis   . Benign neoplasm of colon   . Irritable bowel syndrome   . Arthritis   . Hyperlipidemia   . Ulcer 1951    stomach    Past surgical history: Past Surgical History  Procedure Laterality Date  . Tonsillectomy    . Appendectomy    . Cataract extraction w/ intraocular lens  implant, bilateral  2003  . Colonoscopy    . Esophagogastroduodenoscopy      Family history: Family History  Problem Relation Age of Onset  . Colon cancer Father   . Stroke Mother   . Heart attack Mother   . Colon cancer Mother   . Rectal cancer Neg Hx   . Stomach cancer Neg Hx     Social history: Social History   Social History  . Marital Status: Married    Spouse Name: N/A  . Number of Children: N/A  . Years of Education: N/A   Occupational History  . Not on file.   Social History Main Topics  . Smoking status: Never Smoker   . Smokeless tobacco: Never Used  . Alcohol Use: No  . Drug Use: No  . Sexual Activity: Not on file   Other Topics Concern  . Not on file   Social History Narrative   Environmental History: The patient lives in a 80 year old townhouse with carpeting throughout and central air/heat.  He is a nonsmoker without pets.    Medication List       This list is accurate as of: 10/08/15  6:08 PM.  Always use your most recent med list.               clidinium-chlordiazePOXIDE 5-2.5 MG capsule  Commonly known as:  LIBRAX  Take 2 capsules by mouth 4 (four) times daily -  before meals and at bedtime. Pt states he only take 1 capsule 3-4 times a day     flunisolide 29 MCG/ACT (0.025%)  nasal spray  Commonly known as:  NASAREL  Place 2 sprays into the nose 2 (two) times daily. Dose is for each nostril. Use as directed.     hydrOXYzine 25 MG tablet  Commonly known as:  ATARAX/VISTARIL  Take one tablet at bedtime for itching.     ketorolac 0.5 % ophthalmic solution  Commonly known as:  ACULAR  1 drop 4 (four) times daily. Use as directed.     mometasone 0.1 % ointment  Commonly known as:  ELOCON  Apply topically daily.     omeprazole 20 MG capsule  Commonly known as:  PRILOSEC  Take 20 mg by mouth 2 (two) times daily.     pravastatin 10 MG tablet  Commonly known as:  PRAVACHOL  Take 10 mg by mouth daily.     triamcinolone cream 0.1 %  Commonly known as:  KENALOG  Apply 1 application topically 2 (two) times daily.     Vitamin D 2000 units tablet  Take 2,000  Units by mouth daily.        Known medication allergies: Allergies  Allergen Reactions  . Codeine   . Zantac [Ranitidine]   . Amoxicillin Rash  . Penicillins Rash    I appreciate the opportunity to take part in this Amadi's care. Please do not hesitate to contact me with questions.  Sincerely,   R. Edgar Frisk, MD

## 2015-10-08 NOTE — Assessment & Plan Note (Addendum)
Uncertain etiology.  Food allergen skin tests were negative today despite a positive histamine control.  A prescription has been provided for mometasone 0.1% ointment sparingly to affected areas daily as needed.  A prescription has been provided for hydroxyzine 25 mg at bedtime as needed for pruritus.  The patient has been cautioned regarding somnolence and other potential side effects.  To jumpstart symptom relief, prednisone has been provided: 10 mg daily 5 days.  I have recommended a reevaluation by the insect exterminator with insect eradication if necessary.   If symptoms persist or progress, dermatology evaluation with biopsy may be beneficial.

## 2015-10-08 NOTE — Assessment & Plan Note (Addendum)
   Aeroallergen avoidance measures.  Continue appropriate allergen avoidance measures and flunisolide nasal spray as needed.

## 2015-10-14 ENCOUNTER — Encounter: Payer: Self-pay | Admitting: *Deleted

## 2015-10-16 DIAGNOSIS — H40013 Open angle with borderline findings, low risk, bilateral: Secondary | ICD-10-CM | POA: Diagnosis not present

## 2015-12-17 DIAGNOSIS — L259 Unspecified contact dermatitis, unspecified cause: Secondary | ICD-10-CM | POA: Diagnosis not present

## 2016-05-01 DIAGNOSIS — Z1389 Encounter for screening for other disorder: Secondary | ICD-10-CM | POA: Diagnosis not present

## 2016-05-01 DIAGNOSIS — R350 Frequency of micturition: Secondary | ICD-10-CM | POA: Diagnosis not present

## 2016-05-01 DIAGNOSIS — I10 Essential (primary) hypertension: Secondary | ICD-10-CM | POA: Diagnosis not present

## 2016-05-01 DIAGNOSIS — F0390 Unspecified dementia without behavioral disturbance: Secondary | ICD-10-CM | POA: Diagnosis not present

## 2016-05-01 DIAGNOSIS — M19072 Primary osteoarthritis, left ankle and foot: Secondary | ICD-10-CM | POA: Diagnosis not present

## 2016-05-01 DIAGNOSIS — Z6822 Body mass index (BMI) 22.0-22.9, adult: Secondary | ICD-10-CM | POA: Diagnosis not present

## 2016-05-25 DIAGNOSIS — R350 Frequency of micturition: Secondary | ICD-10-CM | POA: Diagnosis not present

## 2016-05-27 ENCOUNTER — Ambulatory Visit
Admission: RE | Admit: 2016-05-27 | Discharge: 2016-05-27 | Disposition: A | Discharge status: Home / Self Care | Payer: Medicare Other | Source: Ambulatory Visit | Attending: Internal Medicine | Admitting: Internal Medicine

## 2016-05-27 ENCOUNTER — Other Ambulatory Visit: Payer: Self-pay | Admitting: Internal Medicine

## 2016-05-27 DIAGNOSIS — M546 Pain in thoracic spine: Secondary | ICD-10-CM | POA: Diagnosis not present

## 2016-05-27 DIAGNOSIS — M549 Dorsalgia, unspecified: Secondary | ICD-10-CM

## 2016-05-27 DIAGNOSIS — R52 Pain, unspecified: Secondary | ICD-10-CM

## 2016-05-27 DIAGNOSIS — S3992XA Unspecified injury of lower back, initial encounter: Secondary | ICD-10-CM | POA: Diagnosis not present

## 2016-05-27 DIAGNOSIS — M545 Low back pain: Secondary | ICD-10-CM | POA: Diagnosis not present

## 2016-05-27 DIAGNOSIS — Z6822 Body mass index (BMI) 22.0-22.9, adult: Secondary | ICD-10-CM | POA: Diagnosis not present

## 2016-05-27 DIAGNOSIS — M859 Disorder of bone density and structure, unspecified: Secondary | ICD-10-CM | POA: Diagnosis not present

## 2016-06-08 DIAGNOSIS — M542 Cervicalgia: Secondary | ICD-10-CM | POA: Diagnosis not present

## 2016-06-08 DIAGNOSIS — Z6822 Body mass index (BMI) 22.0-22.9, adult: Secondary | ICD-10-CM | POA: Diagnosis not present

## 2016-06-08 DIAGNOSIS — W07XXXA Fall from chair, initial encounter: Secondary | ICD-10-CM | POA: Diagnosis not present

## 2016-06-09 ENCOUNTER — Ambulatory Visit
Admission: RE | Admit: 2016-06-09 | Discharge: 2016-06-09 | Disposition: A | Discharge status: Home / Self Care | Payer: Medicare Other | Source: Ambulatory Visit | Attending: Internal Medicine | Admitting: Internal Medicine

## 2016-06-09 ENCOUNTER — Other Ambulatory Visit: Payer: Self-pay | Admitting: Internal Medicine

## 2016-06-09 DIAGNOSIS — M542 Cervicalgia: Secondary | ICD-10-CM

## 2016-06-17 DIAGNOSIS — Z961 Presence of intraocular lens: Secondary | ICD-10-CM | POA: Diagnosis not present

## 2016-06-17 DIAGNOSIS — H35372 Puckering of macula, left eye: Secondary | ICD-10-CM | POA: Diagnosis not present

## 2016-06-17 DIAGNOSIS — H26491 Other secondary cataract, right eye: Secondary | ICD-10-CM | POA: Diagnosis not present

## 2016-06-17 DIAGNOSIS — H40013 Open angle with borderline findings, low risk, bilateral: Secondary | ICD-10-CM | POA: Diagnosis not present

## 2016-06-29 ENCOUNTER — Telehealth: Payer: Self-pay | Admitting: Internal Medicine

## 2016-06-29 NOTE — Telephone Encounter (Signed)
Spoke with pt's wife.  Pt c/o bumps on upper back and arms for past 2 weeks.  Advised that pt has not seen Dr Annamaria Boots since 2014 and would need to call Dr Bobbitt's office that he seen last in 09/2015.  Pt verbalized understanding.

## 2016-07-10 DIAGNOSIS — Z23 Encounter for immunization: Secondary | ICD-10-CM | POA: Diagnosis not present

## 2016-07-10 DIAGNOSIS — L259 Unspecified contact dermatitis, unspecified cause: Secondary | ICD-10-CM | POA: Diagnosis not present

## 2016-07-15 DIAGNOSIS — R350 Frequency of micturition: Secondary | ICD-10-CM | POA: Diagnosis not present

## 2016-07-15 DIAGNOSIS — R300 Dysuria: Secondary | ICD-10-CM | POA: Diagnosis not present

## 2016-07-24 DIAGNOSIS — Z23 Encounter for immunization: Secondary | ICD-10-CM | POA: Diagnosis not present

## 2016-07-24 DIAGNOSIS — L259 Unspecified contact dermatitis, unspecified cause: Secondary | ICD-10-CM | POA: Diagnosis not present

## 2016-08-18 DIAGNOSIS — Z23 Encounter for immunization: Secondary | ICD-10-CM | POA: Diagnosis not present

## 2016-08-18 DIAGNOSIS — R21 Rash and other nonspecific skin eruption: Secondary | ICD-10-CM | POA: Diagnosis not present

## 2016-08-19 DIAGNOSIS — L309 Dermatitis, unspecified: Secondary | ICD-10-CM | POA: Diagnosis not present

## 2016-09-17 DIAGNOSIS — L2084 Intrinsic (allergic) eczema: Secondary | ICD-10-CM | POA: Diagnosis not present

## 2016-09-17 DIAGNOSIS — Z23 Encounter for immunization: Secondary | ICD-10-CM | POA: Diagnosis not present

## 2016-11-02 DIAGNOSIS — H019 Unspecified inflammation of eyelid: Secondary | ICD-10-CM | POA: Diagnosis not present

## 2016-12-22 DIAGNOSIS — L309 Dermatitis, unspecified: Secondary | ICD-10-CM | POA: Diagnosis not present

## 2016-12-23 DIAGNOSIS — L309 Dermatitis, unspecified: Secondary | ICD-10-CM | POA: Diagnosis not present

## 2017-02-05 DIAGNOSIS — R350 Frequency of micturition: Secondary | ICD-10-CM | POA: Diagnosis not present

## 2017-02-05 DIAGNOSIS — R300 Dysuria: Secondary | ICD-10-CM | POA: Diagnosis not present

## 2017-04-23 DIAGNOSIS — R300 Dysuria: Secondary | ICD-10-CM | POA: Diagnosis not present

## 2017-04-23 DIAGNOSIS — R350 Frequency of micturition: Secondary | ICD-10-CM | POA: Diagnosis not present

## 2017-05-04 DIAGNOSIS — Z800 Family history of malignant neoplasm of digestive organs: Secondary | ICD-10-CM | POA: Diagnosis not present

## 2017-05-04 DIAGNOSIS — M542 Cervicalgia: Secondary | ICD-10-CM | POA: Diagnosis not present

## 2017-05-04 DIAGNOSIS — K219 Gastro-esophageal reflux disease without esophagitis: Secondary | ICD-10-CM | POA: Diagnosis not present

## 2017-05-04 DIAGNOSIS — R141 Gas pain: Secondary | ICD-10-CM | POA: Diagnosis not present

## 2017-05-04 DIAGNOSIS — K5900 Constipation, unspecified: Secondary | ICD-10-CM | POA: Diagnosis not present

## 2017-05-04 DIAGNOSIS — Z808 Family history of malignant neoplasm of other organs or systems: Secondary | ICD-10-CM | POA: Diagnosis not present

## 2017-05-04 DIAGNOSIS — F3289 Other specified depressive episodes: Secondary | ICD-10-CM | POA: Diagnosis not present

## 2017-05-04 DIAGNOSIS — R070 Pain in throat: Secondary | ICD-10-CM | POA: Diagnosis not present

## 2017-05-04 DIAGNOSIS — Z1389 Encounter for screening for other disorder: Secondary | ICD-10-CM | POA: Diagnosis not present

## 2017-05-04 DIAGNOSIS — F0390 Unspecified dementia without behavioral disturbance: Secondary | ICD-10-CM | POA: Diagnosis not present

## 2017-05-04 DIAGNOSIS — R634 Abnormal weight loss: Secondary | ICD-10-CM | POA: Diagnosis not present

## 2017-05-04 DIAGNOSIS — E784 Other hyperlipidemia: Secondary | ICD-10-CM | POA: Diagnosis not present

## 2017-05-04 DIAGNOSIS — R5382 Chronic fatigue, unspecified: Secondary | ICD-10-CM | POA: Diagnosis not present

## 2017-05-04 DIAGNOSIS — I10 Essential (primary) hypertension: Secondary | ICD-10-CM | POA: Diagnosis not present

## 2017-05-04 DIAGNOSIS — Z6821 Body mass index (BMI) 21.0-21.9, adult: Secondary | ICD-10-CM | POA: Diagnosis not present

## 2017-05-08 DIAGNOSIS — F0391 Unspecified dementia with behavioral disturbance: Secondary | ICD-10-CM | POA: Diagnosis not present

## 2017-05-08 DIAGNOSIS — R402441 Other coma, without documented Glasgow coma scale score, or with partial score reported, in the field [EMT or ambulance]: Secondary | ICD-10-CM | POA: Diagnosis not present

## 2017-05-08 DIAGNOSIS — F0390 Unspecified dementia without behavioral disturbance: Secondary | ICD-10-CM | POA: Diagnosis not present

## 2017-05-08 DIAGNOSIS — Z888 Allergy status to other drugs, medicaments and biological substances status: Secondary | ICD-10-CM | POA: Diagnosis not present

## 2017-05-08 DIAGNOSIS — G238 Other specified degenerative diseases of basal ganglia: Secondary | ICD-10-CM | POA: Diagnosis not present

## 2017-05-08 DIAGNOSIS — Z885 Allergy status to narcotic agent status: Secondary | ICD-10-CM | POA: Diagnosis not present

## 2017-05-08 DIAGNOSIS — R4182 Altered mental status, unspecified: Secondary | ICD-10-CM | POA: Diagnosis not present

## 2017-05-08 DIAGNOSIS — G9389 Other specified disorders of brain: Secondary | ICD-10-CM | POA: Diagnosis not present

## 2017-05-08 DIAGNOSIS — I6782 Cerebral ischemia: Secondary | ICD-10-CM | POA: Diagnosis not present

## 2017-05-08 DIAGNOSIS — Z880 Allergy status to penicillin: Secondary | ICD-10-CM | POA: Diagnosis not present

## 2017-05-08 DIAGNOSIS — G319 Degenerative disease of nervous system, unspecified: Secondary | ICD-10-CM | POA: Diagnosis not present

## 2017-05-14 DIAGNOSIS — M79609 Pain in unspecified limb: Secondary | ICD-10-CM | POA: Diagnosis not present

## 2017-05-14 DIAGNOSIS — R531 Weakness: Secondary | ICD-10-CM | POA: Diagnosis not present

## 2017-05-14 DIAGNOSIS — Z1379 Encounter for other screening for genetic and chromosomal anomalies: Secondary | ICD-10-CM | POA: Diagnosis not present

## 2017-05-14 DIAGNOSIS — I25118 Atherosclerotic heart disease of native coronary artery with other forms of angina pectoris: Secondary | ICD-10-CM | POA: Diagnosis not present

## 2017-05-14 DIAGNOSIS — K210 Gastro-esophageal reflux disease with esophagitis: Secondary | ICD-10-CM | POA: Diagnosis not present

## 2017-05-26 ENCOUNTER — Encounter: Payer: Self-pay | Admitting: Internal Medicine

## 2017-06-28 DIAGNOSIS — H35033 Hypertensive retinopathy, bilateral: Secondary | ICD-10-CM | POA: Diagnosis not present

## 2017-06-28 DIAGNOSIS — H40013 Open angle with borderline findings, low risk, bilateral: Secondary | ICD-10-CM | POA: Diagnosis not present

## 2017-06-28 DIAGNOSIS — Z961 Presence of intraocular lens: Secondary | ICD-10-CM | POA: Diagnosis not present

## 2017-06-28 DIAGNOSIS — H019 Unspecified inflammation of eyelid: Secondary | ICD-10-CM | POA: Diagnosis not present

## 2017-12-27 DIAGNOSIS — H40013 Open angle with borderline findings, low risk, bilateral: Secondary | ICD-10-CM | POA: Diagnosis not present

## 2018-02-13 ENCOUNTER — Other Ambulatory Visit: Payer: Self-pay

## 2018-02-13 ENCOUNTER — Inpatient Hospital Stay (HOSPITAL_COMMUNITY)
Admission: EM | Admit: 2018-02-13 | Discharge: 2018-03-08 | DRG: 189 | Disposition: A | Discharge status: Nursing Home (Includes ICF) | Payer: Medicare Other | Attending: Family Medicine | Admitting: Family Medicine

## 2018-02-13 ENCOUNTER — Encounter (HOSPITAL_COMMUNITY): Payer: Self-pay | Admitting: Emergency Medicine

## 2018-02-13 ENCOUNTER — Emergency Department (HOSPITAL_COMMUNITY): Payer: Medicare Other

## 2018-02-13 DIAGNOSIS — D649 Anemia, unspecified: Secondary | ICD-10-CM | POA: Diagnosis present

## 2018-02-13 DIAGNOSIS — H1045 Other chronic allergic conjunctivitis: Secondary | ICD-10-CM | POA: Diagnosis present

## 2018-02-13 DIAGNOSIS — F0390 Unspecified dementia without behavioral disturbance: Secondary | ICD-10-CM | POA: Diagnosis not present

## 2018-02-13 DIAGNOSIS — M7022 Olecranon bursitis, left elbow: Secondary | ICD-10-CM | POA: Diagnosis present

## 2018-02-13 DIAGNOSIS — R410 Disorientation, unspecified: Secondary | ICD-10-CM

## 2018-02-13 DIAGNOSIS — L299 Pruritus, unspecified: Secondary | ICD-10-CM | POA: Diagnosis present

## 2018-02-13 DIAGNOSIS — A480 Gas gangrene: Secondary | ICD-10-CM | POA: Diagnosis not present

## 2018-02-13 DIAGNOSIS — L03116 Cellulitis of left lower limb: Secondary | ICD-10-CM | POA: Diagnosis present

## 2018-02-13 DIAGNOSIS — Z961 Presence of intraocular lens: Secondary | ICD-10-CM | POA: Diagnosis present

## 2018-02-13 DIAGNOSIS — F329 Major depressive disorder, single episode, unspecified: Secondary | ICD-10-CM | POA: Diagnosis present

## 2018-02-13 DIAGNOSIS — J309 Allergic rhinitis, unspecified: Secondary | ICD-10-CM | POA: Diagnosis present

## 2018-02-13 DIAGNOSIS — L03115 Cellulitis of right lower limb: Secondary | ICD-10-CM | POA: Diagnosis present

## 2018-02-13 DIAGNOSIS — E785 Hyperlipidemia, unspecified: Secondary | ICD-10-CM | POA: Diagnosis present

## 2018-02-13 DIAGNOSIS — M7522 Bicipital tendinitis, left shoulder: Secondary | ICD-10-CM | POA: Diagnosis present

## 2018-02-13 DIAGNOSIS — K589 Irritable bowel syndrome without diarrhea: Secondary | ICD-10-CM | POA: Diagnosis present

## 2018-02-13 DIAGNOSIS — Z86010 Personal history of colonic polyps: Secondary | ICD-10-CM

## 2018-02-13 DIAGNOSIS — G9341 Metabolic encephalopathy: Secondary | ICD-10-CM | POA: Diagnosis present

## 2018-02-13 DIAGNOSIS — Z880 Allergy status to penicillin: Secondary | ICD-10-CM

## 2018-02-13 DIAGNOSIS — R4020 Unspecified coma: Secondary | ICD-10-CM | POA: Diagnosis not present

## 2018-02-13 DIAGNOSIS — J9601 Acute respiratory failure with hypoxia: Secondary | ICD-10-CM | POA: Diagnosis not present

## 2018-02-13 DIAGNOSIS — B86 Scabies: Secondary | ICD-10-CM | POA: Diagnosis not present

## 2018-02-13 DIAGNOSIS — R4182 Altered mental status, unspecified: Secondary | ICD-10-CM | POA: Diagnosis present

## 2018-02-13 DIAGNOSIS — K219 Gastro-esophageal reflux disease without esophagitis: Secondary | ICD-10-CM | POA: Diagnosis present

## 2018-02-13 DIAGNOSIS — Z634 Disappearance and death of family member: Secondary | ICD-10-CM | POA: Diagnosis not present

## 2018-02-13 DIAGNOSIS — Z800 Family history of malignant neoplasm of digestive organs: Secondary | ICD-10-CM

## 2018-02-13 DIAGNOSIS — F411 Generalized anxiety disorder: Secondary | ICD-10-CM | POA: Diagnosis not present

## 2018-02-13 DIAGNOSIS — R600 Localized edema: Secondary | ICD-10-CM | POA: Diagnosis present

## 2018-02-13 DIAGNOSIS — Z9183 Wandering in diseases classified elsewhere: Secondary | ICD-10-CM

## 2018-02-13 DIAGNOSIS — Z888 Allergy status to other drugs, medicaments and biological substances status: Secondary | ICD-10-CM

## 2018-02-13 DIAGNOSIS — R0902 Hypoxemia: Secondary | ICD-10-CM | POA: Diagnosis not present

## 2018-02-13 DIAGNOSIS — G4733 Obstructive sleep apnea (adult) (pediatric): Secondary | ICD-10-CM | POA: Diagnosis not present

## 2018-02-13 DIAGNOSIS — Z9842 Cataract extraction status, left eye: Secondary | ICD-10-CM

## 2018-02-13 DIAGNOSIS — I808 Phlebitis and thrombophlebitis of other sites: Secondary | ICD-10-CM | POA: Diagnosis present

## 2018-02-13 DIAGNOSIS — Z881 Allergy status to other antibiotic agents status: Secondary | ICD-10-CM

## 2018-02-13 DIAGNOSIS — Z79899 Other long term (current) drug therapy: Secondary | ICD-10-CM | POA: Diagnosis not present

## 2018-02-13 DIAGNOSIS — E860 Dehydration: Secondary | ICD-10-CM | POA: Diagnosis present

## 2018-02-13 DIAGNOSIS — Z008 Encounter for other general examination: Secondary | ICD-10-CM

## 2018-02-13 DIAGNOSIS — I872 Venous insufficiency (chronic) (peripheral): Secondary | ICD-10-CM | POA: Diagnosis present

## 2018-02-13 DIAGNOSIS — Z885 Allergy status to narcotic agent status: Secondary | ICD-10-CM

## 2018-02-13 DIAGNOSIS — Z9841 Cataract extraction status, right eye: Secondary | ICD-10-CM

## 2018-02-13 DIAGNOSIS — R609 Edema, unspecified: Secondary | ICD-10-CM | POA: Diagnosis present

## 2018-02-13 DIAGNOSIS — Z8249 Family history of ischemic heart disease and other diseases of the circulatory system: Secondary | ICD-10-CM

## 2018-02-13 DIAGNOSIS — R2232 Localized swelling, mass and lump, left upper limb: Secondary | ICD-10-CM

## 2018-02-13 DIAGNOSIS — M542 Cervicalgia: Secondary | ICD-10-CM | POA: Diagnosis not present

## 2018-02-13 LAB — BLOOD GAS, ARTERIAL
Acid-Base Excess: 4.1 mmol/L — ABNORMAL HIGH (ref 0.0–2.0)
BICARBONATE: 29.4 mmol/L — AB (ref 20.0–28.0)
Drawn by: 51425
FIO2: 21
O2 SAT: 96.2 %
PCO2 ART: 50 mmHg — AB (ref 32.0–48.0)
PO2 ART: 87.9 mmHg (ref 83.0–108.0)
Patient temperature: 98.6
pH, Arterial: 7.387 (ref 7.350–7.450)

## 2018-02-13 LAB — COMPREHENSIVE METABOLIC PANEL
ALK PHOS: 53 U/L (ref 38–126)
ALT: 20 U/L (ref 17–63)
AST: 27 U/L (ref 15–41)
Albumin: 3.5 g/dL (ref 3.5–5.0)
Anion gap: 5 (ref 5–15)
BILIRUBIN TOTAL: 0.7 mg/dL (ref 0.3–1.2)
BUN: 21 mg/dL — AB (ref 6–20)
CALCIUM: 8.6 mg/dL — AB (ref 8.9–10.3)
CHLORIDE: 105 mmol/L (ref 101–111)
CO2: 32 mmol/L (ref 22–32)
CREATININE: 1.01 mg/dL (ref 0.61–1.24)
Glucose, Bld: 105 mg/dL — ABNORMAL HIGH (ref 65–99)
Potassium: 4.1 mmol/L (ref 3.5–5.1)
Sodium: 142 mmol/L (ref 135–145)
Total Protein: 6.3 g/dL — ABNORMAL LOW (ref 6.5–8.1)

## 2018-02-13 LAB — CBC WITH DIFFERENTIAL/PLATELET
BASOS ABS: 0.1 10*3/uL (ref 0.0–0.1)
Basophils Relative: 1 %
EOS PCT: 13 %
Eosinophils Absolute: 0.5 10*3/uL (ref 0.0–0.7)
HEMATOCRIT: 36.9 % — AB (ref 39.0–52.0)
HEMOGLOBIN: 11.9 g/dL — AB (ref 13.0–17.0)
LYMPHS ABS: 1 10*3/uL (ref 0.7–4.0)
LYMPHS PCT: 25 %
MCH: 29.3 pg (ref 26.0–34.0)
MCHC: 32.2 g/dL (ref 30.0–36.0)
MCV: 90.9 fL (ref 78.0–100.0)
Monocytes Absolute: 0.7 10*3/uL (ref 0.1–1.0)
Monocytes Relative: 18 %
NEUTROS ABS: 1.9 10*3/uL (ref 1.7–7.7)
NEUTROS PCT: 43 %
PLATELETS: 281 10*3/uL (ref 150–400)
RBC: 4.06 MIL/uL — AB (ref 4.22–5.81)
RDW: 14.5 % (ref 11.5–15.5)
WBC: 4.2 10*3/uL (ref 4.0–10.5)

## 2018-02-13 LAB — D-DIMER, QUANTITATIVE: D-Dimer, Quant: 1.73 ug/mL-FEU — ABNORMAL HIGH (ref 0.00–0.50)

## 2018-02-13 LAB — BRAIN NATRIURETIC PEPTIDE: B NATRIURETIC PEPTIDE 5: 77.5 pg/mL (ref 0.0–100.0)

## 2018-02-13 LAB — ETHANOL

## 2018-02-13 MED ORDER — HYDROXYZINE HCL 25 MG PO TABS
25.0000 mg | ORAL_TABLET | Freq: Once | ORAL | Status: AC
  Administered 2018-02-14: 25 mg via ORAL
  Filled 2018-02-13: qty 1

## 2018-02-13 NOTE — ED Notes (Signed)
ED TO INPATIENT HANDOFF REPORT  Name/Age/Gender [name] [name] 84 y.o. male  Code Status   Home/SNF/Other Home  Chief Complaint Confusion  Level of Care/Admitting Diagnosis ED Disposition    ED Disposition Condition Comment   Admit  Hospital Area: [facility] [[id number]]  Level of Care: Med-Surg [16]  Diagnosis: Hypoxemia [799.02.ICD-9-CM]  Admitting Physician: Elwyn Reach [[id number]]  Attending Physician: Elwyn Reach [[id number]]  Estimated length of stay: past midnight tomorrow  Certification:: I certify this patient will need inpatient services for at least 2 midnights  PT Class (Do Not Modify): Inpatient [101]  PT Acc Code (Do Not Modify): Private [1]       Medical History Past Medical History:  Diagnosis Date  . Allergic rhinitis, cause unspecified   . Anxiety state, unspecified   . Arthritis   . Benign neoplasm of colon   . Depressive disorder, not elsewhere classified   . Esophageal reflux   . Hyperlipidemia   . Irritable bowel syndrome   . Obstructive sleep apnea (adult) (pediatric)   . Other chronic allergic conjunctivitis   . Ulcer 1951   stomach    Allergies Allergies  Allergen Reactions  . Codeine   . Zantac [Ranitidine]   . Amoxicillin Rash  . Penicillins Rash    IV Location/Drains/Wounds Patient Lines/Drains/Airways Status   Active Line/Drains/Airways    Name:   Placement date:   Placement time:   Site:   Days:   Peripheral IV 02/13/18 Left Antecubital   02/13/18    2116    Antecubital   less than 1          Labs/Imaging Results for orders placed or performed during the hospital encounter of 02/13/18 (from the past 48 hour(s))  Brain natriuretic peptide     Status: None   Collection Time: 02/13/18  8:41 PM  Result Value Ref Range   B Natriuretic Peptide 77.5 0.0 - 100.0 pg/mL    Comment: Performed at [facility], [place] [address], [place], [place] [address]  CBC with Differential/Platelet      Status: Abnormal   Collection Time: 02/13/18  9:18 PM  Result Value Ref Range   WBC 4.2 4.0 - 10.5 K/uL   RBC 4.06 (L) 4.22 - 5.81 MIL/uL   Hemoglobin 11.9 (L) 13.0 - 17.0 g/dL   HCT 36.9 (L) 39.0 - 52.0 %   MCV 90.9 78.0 - 100.0 fL   MCH 29.3 26.0 - 34.0 pg   MCHC 32.2 30.0 - 36.0 g/dL   RDW 14.5 11.5 - 15.5 %   Platelets 281 150 - 400 K/uL   Neutrophils Relative % 43 %   Neutro Abs 1.9 1.7 - 7.7 K/uL   Lymphocytes Relative 25 %   Lymphs Abs 1.0 0.7 - 4.0 K/uL   Monocytes Relative 18 %   Monocytes Absolute 0.7 0.1 - 1.0 K/uL   Eosinophils Relative 13 %   Eosinophils Absolute 0.5 0.0 - 0.7 K/uL   Basophils Relative 1 %   Basophils Absolute 0.1 0.0 - 0.1 K/uL    Comment: Performed at [facility], [place] [address] [place], [place] [address]  Comprehensive metabolic panel     Status: Abnormal   Collection Time: 02/13/18  9:18 PM  Result Value Ref Range   Sodium 142 135 - 145 mmol/L   Potassium 4.1 3.5 - 5.1 mmol/L   Chloride 105 101 - 111 mmol/L   CO2 32 22 - 32 mmol/L  Glucose, Bld 105 (H) 65 - 99 mg/dL   BUN 21 (H) 6 - 20 mg/dL   Creatinine, Ser 1.01 0.61 - 1.24 mg/dL   Calcium 8.6 (L) 8.9 - 10.3 mg/dL   Total Protein 6.3 (L) 6.5 - 8.1 g/dL   Albumin 3.5 3.5 - 5.0 g/dL   AST 27 15 - 41 U/L   ALT 20 17 - 63 U/L   Alkaline Phosphatase 53 38 - 126 U/L   Total Bilirubin 0.7 0.3 - 1.2 mg/dL   GFR calc non Af Amer >60 >60 mL/min   GFR calc Af Amer >60 >60 mL/min    Comment: (NOTE) The eGFR has been calculated using the CKD EPI equation. This calculation has not been validated in all clinical situations. eGFR's persistently <60 mL/min signify possible Chronic Kidney Disease.    Anion gap 5 5 - 15    Comment: Performed at [facility], [place] [address], [place], [place] [address]  Ethanol     Status: None   Collection Time: 02/13/18  9:18 PM  Result Value Ref Range   Alcohol, Ethyl (B) <10 <10 mg/dL    Comment: (NOTE) Lowest  detectable limit for serum alcohol is 10 mg/dL. For medical purposes only. Performed at [facility], [place] [address] [place], [place] [address]   D-dimer, quantitative (not at [facility])     Status: Abnormal   Collection Time: 02/13/18  9:18 PM  Result Value Ref Range   D-Dimer, Quant 1.73 (H) 0.00 - 0.50 ug/mL-FEU    Comment: (NOTE) At the manufacturer cut-off of 0.50 ug/mL FEU, this assay has been documented to exclude PE with a sensitivity and negative predictive value of 97 to 99%.  At this time, this assay has not been approved by the FDA to exclude DVT/VTE. Results should be correlated with clinical presentation. Performed at [facility], [place] [address], [place], [place] [address]   Blood gas, arterial     Status: Abnormal   Collection Time: 02/13/18 10:54 PM  Result Value Ref Range   FIO2 21.00    pH, Arterial 7.387 7.350 - 7.450   pCO2 arterial 50.0 (H) 32.0 - 48.0 mmHg   pO2, Arterial 87.9 83.0 - 108.0 mmHg   Bicarbonate 29.4 (H) 20.0 - 28.0 mmol/L   Acid-Base Excess 4.1 (H) 0.0 - 2.0 mmol/L   O2 Saturation 96.2 %   Patient temperature 98.6    Collection site RIGHT RADIAL    Drawn by [phone]    Sample type ARTERIAL DRAW    Allens test (pass/fail) PASS PASS    Comment: Performed at [facility], [place] [address] [place], [place] [address]   Dg Chest 2 View  Result Date: 02/13/2018 CLINICAL DATA:  Confusion, lower extremity edema. EXAM: CHEST - 2 VIEW COMPARISON:  None. FINDINGS: The heart size and mediastinal contours are within normal limits. The aorta is tortuous. Both lungs are clear. The visualized skeletal structures are unremarkable. IMPRESSION: No active cardiopulmonary disease. Electronically Signed   By: Abelardo Diesel M.D.   On: 02/13/2018 21:23    Pending Labs Unresulted Labs (From admission, onward)   Start     Ordered   02/13/18 2041  Rapid urine drug screen (hospital performed)  STAT,   R     02/13/18  2040   02/13/18 2041  Urinalysis, Routine w reflex microscopic  Once,   R     02/13/18 2040   Signed and Held  CBC  (  enoxaparin (LOVENOX)    CrCl >/= 30 ml/min)  Once,   R    Comments:  Baseline for enoxaparin therapy IF NOT ALREADY DRAWN.  Notify MD if PLT < 100 K.    Signed and Held   Signed and Held  Creatinine, serum  (enoxaparin (LOVENOX)    CrCl >/= 30 ml/min)  Once,   R    Comments:  Baseline for enoxaparin therapy IF NOT ALREADY DRAWN.    Signed and Held   Signed and Held  Creatinine, serum  (enoxaparin (LOVENOX)    CrCl >/= 30 ml/min)  Weekly,   R    Comments:  while on enoxaparin therapy    Signed and Held   Signed and Held  Comprehensive metabolic panel  Tomorrow morning,   R     Signed and Held   Signed and Held  CBC  Tomorrow morning,   R     Signed and Held      Vitals/Pain Today's Vitals   02/13/18 2230 02/13/18 2232 02/13/18 2236 02/13/18 2303  BP: 118/69     Pulse: 60 69 83   Resp: 13 (!) 25 (!) 23   Temp:    97.8 F (36.6 C)  TempSrc:    Oral  SpO2: 97% (!) 89% (!) 88%   Weight:      Height:      PainSc:        Isolation Precautions No active isolations  Medications Medications  hydrOXYzine (ATARAX/VISTARIL) tablet 25 mg (has no administration in time range)    Mobility non-ambulatory

## 2018-02-13 NOTE — ED Provider Notes (Signed)
[facility] DEPT Provider Note   CSN: [id number] Arrival date & time: 02/13/18  2017     History   Chief Complaint No chief complaint on file.   HPI [name] [name] is a 84 y.o. male.  HPI   84 year old male with history of dementia hyperlipidemia, sleep apnea, anxiety brought here via EMS from home for evaluation of confusion.  EMS came to the house to bring pt's wife to the hospital, and he came along with her.  Pt's wife is currently admitted today for GI bleed, and was coded twice, currently intubated.  She will be going to the ICU.  Patient found wandering around in the hall and outside of the hospital.  He is at a danger for himself.  History of dementia, was found driving a car without driver license several months ago.  He was also found to walk away from his house to another city. The staff upstair that cared for his wife does not feel pt is safe to be on his own accord.  Level V caveats due to dementia.  Pt currently without any complaint.    Past Medical History:  Diagnosis Date  . Allergic rhinitis, cause unspecified   . Anxiety state, unspecified   . Arthritis   . Benign neoplasm of colon   . Depressive disorder, not elsewhere classified   . Esophageal reflux   . Hyperlipidemia   . Irritable bowel syndrome   . Obstructive sleep apnea (adult) (pediatric)   . Other chronic allergic conjunctivitis   . Ulcer ([place]) 1951   stomach    Patient Active Problem List   Diagnosis Date Noted  . Dermatitis 10/08/2015  . CHEST PAIN, ATYPICAL 04/11/2010  . Personal history of colon polyps and family history of colon cancer 12/19/2007  . ANXIETY 12/19/2007  . DEPRESSION 12/19/2007  . OBSTRUCTIVE SLEEP APNEA 12/19/2007  . ALLERGIC CONJUNCTIVITIS 12/19/2007  . Seasonal allergic rhinitis 12/19/2007  . GERD 12/19/2007  . IBS 12/19/2007    Past Surgical History:  Procedure Laterality Date  . APPENDECTOMY    . CATARACT EXTRACTION W/  INTRAOCULAR LENS  IMPLANT, BILATERAL  2003  . COLONOSCOPY    . ESOPHAGOGASTRODUODENOSCOPY    . TONSILLECTOMY          Home Medications    Prior to Admission medications   Medication Sig Start Date End Date Taking? Authorizing Provider  Cholecalciferol (VITAMIN D) 2000 UNITS tablet Take 2,000 Units by mouth daily.    [provider]  clidinium-chlordiazePOXIDE (LIBRAX) 2.5-5 MG per capsule Take 2 capsules by mouth 4 (four) times daily -  before meals and at bedtime. Pt states he only take 1 capsule 3-4 times a day    [provider]  flunisolide (NASAREL) 29 MCG/ACT (0.025%) nasal spray Place 2 sprays into the nose 2 (two) times daily. Dose is for each nostril. Use as directed.     [provider]  hydrOXYzine (ATARAX/VISTARIL) 25 MG tablet Take one tablet at bedtime for itching. 10/08/15   Bobbitt, Sed[place] Muta, MD  ketorolac (ACULAR) 0.5 % ophthalmic solution 1 drop 4 (four) times daily. Use as directed.     [provider]  mometasone (ELOCON) 0.1 % ointment Apply topically daily. 10/08/15   Bobbitt, [place] Muta, MD  omeprazole (PRILOSEC) 20 MG capsule Take 20 mg by mouth 2 (two) times daily.      [provider]  pravastatin (PRAVACHOL) 10 MG tablet Take 10 mg by mouth daily.  [provider]  triamcinolone cream (KENALOG) 0.1 % Apply 1 application topically 2 (two) times daily.    [provider]    Family History Family History  Problem Relation Age of Onset  . Colon cancer Father   . Stroke Mother   . Heart attack Mother   . Colon cancer Mother   . Rectal cancer Neg Hx   . Stomach cancer Neg Hx     Social History Social History   Tobacco Use  . Smoking status: Never Smoker  . Smokeless tobacco: Never Used  Substance Use Topics  . Alcohol use: No  . Drug use: No     Allergies   Codeine; Zantac [ranitidine]; Amoxicillin; and Penicillins   Review of Systems Review of Systems  Unable to perform  ROS: Dementia     Physical Exam Updated Vital Signs There were no vitals taken for this visit.  Physical Exam  Constitutional: He appears well-developed and well-nourished. No distress.  Elderly male in no acute discomfort, talking to himself.  HENT:  Head: Normocephalic and atraumatic.  Eyes: Pupils are equal, round, and reactive to light. EOM are normal.  Neck: Normal range of motion.  Cardiovascular: Normal rate and regular rhythm.  Pulmonary/Chest: Effort normal and breath sounds normal.  Faint crackle heard  Abdominal: Soft. Bowel sounds are normal.  Maculo papular rash noted to abdomen with excoriation marks and surrounding skin erythema.  Musculoskeletal: Normal range of motion.  Neurological:  Alert to self only, unable to tell place, time, or situation.  Patient found to be confabulating stories.  Skin: Rash (Multiple maculopapular erythematous rash noted throughout bilateral lower extremities with surrounding 2+ pitting edema to lower extremities and excoriation marks throughout.) noted.  Psychiatric: His behavior is normal. Thought content is delusional.  Nursing note and vitals reviewed.    ED Treatments / Results  Labs (all labs ordered are listed, but only abnormal results are displayed) Labs Reviewed  CBC WITH DIFFERENTIAL/PLATELET - Abnormal; Notable for the following components:      Result Value   RBC 4.06 (*)    Hemoglobin 11.9 (*)    HCT 36.9 (*)    All other components within normal limits  COMPREHENSIVE METABOLIC PANEL - Abnormal; Notable for the following components:   Glucose, Bld 105 (*)    BUN 21 (*)    Calcium 8.6 (*)    Total Protein 6.3 (*)    All other components within normal limits  ETHANOL  BRAIN NATRIURETIC PEPTIDE  RAPID URINE DRUG SCREEN, HOSP PERFORMED  URINALYSIS, ROUTINE W REFLEX MICROSCOPIC  BLOOD GAS, ARTERIAL  D-DIMER, QUANTITATIVE (NOT AT [facility])    EKG None  Radiology Dg Chest 2 View  Result Date: 02/13/2018 CLINICAL  DATA:  Confusion, lower extremity edema. EXAM: CHEST - 2 VIEW COMPARISON:  None. FINDINGS: The heart size and mediastinal contours are within normal limits. The aorta is tortuous. Both lungs are clear. The visualized skeletal structures are unremarkable. IMPRESSION: No active cardiopulmonary disease. Electronically Signed   By: Abelardo Diesel M.D.   On: 02/13/2018 21:23    Procedures Procedures (including critical care time)  Medications Ordered in ED Medications - No data to display   Initial Impression / Assessment and Plan / ED Course  I have reviewed the triage vital signs and the nursing notes.  Pertinent labs & imaging results that were available during my care of the patient were reviewed by me and considered in my medical decision making (see chart for details).  BP 118/69   Pulse 83   Temp 97.8 F (36.6 C) (Oral)   Resp (!) 23   Ht 6' 2.5" (1.892 m)   Wt 82.1 kg (181 lb)   SpO2 (!) 88%   BMI 22.93 kg/m    Final Clinical Impressions(s) / ED Diagnoses   Final diagnoses:  Hypoxemia  Confusion    ED Discharge Orders    None     8:52 PM Patient with baseline dementia who came to the hospital with wife this AM when she developed GI bleed.  Pt's wife is very sick, and coded several times on the floor.  Pt however started to wander around the hall way and is demented.  He is a danger to himself and will need help with placement.  ON exam, he has significant bilateral leg swelling with rash and excoration marks throughout concerning for contact dermatitis causing potential cellulitis.  Will perform medical screening.  Will help find placement for him to ensure patient safety.   11:03 PM Labs are reassuring however staff notify that pt desats to low 80s on RA while sleeping.  Since pt has legs swelling, d-dimer order to assess for potential PE  Pt does have document hx of sleep apnea, which may explain his hypoxia.  I have paged social work but have not heard  back  Appreciate consultation with [facility] Dr. Jonelle Sidle who agrees to see and admit pt for hypoxia, confusion and the need for placement.     Domenic Moras, PA-C 02/13/18 2307    Margette Fast, MD 02/14/18 1057

## 2018-02-13 NOTE — ED Notes (Signed)
Bed: HN88 Expected date:  Expected time:  Means of arrival:  Comments: 85yo M/ Confusion

## 2018-02-13 NOTE — H&P (Signed)
History and Physical    [name] [name] KYH:[id number] DOB: 06/16/1931 DOA: 02/13/2018  Referring MD/NP/PA: Dr. Laverta Baltimore  PCP: Leanna Battles, MD   Outpatient Specialists: None  Patient coming from: Home  Chief Complaint: Altered mental status  HPI: [name] [name] is a 84 y.o. male with medical history significant of dementia, hypertension, obstructive sleep apnea, anxiety disorder who was in the hospital accompanying his wife that was admitted to the intensive care unit today with GI bleed.  She was intubated and currently on the vent.  While patient was in the hospital he was noted to be confused wandering around and not himself.  Staff brought patient to the emergency room where he was evaluated.  He was found to have oxygen saturation of 88% on room air.  Also evidence of possible cellulitis of the lower extremity but bilateral edema.  No prior cardiac history.  Patient was visibly confused and disoriented.  He is therefore being admitted to the hospital for work-up of acute delirium.  Also work-up for his hypoxemia.  Chest x-ray showed no evidence of pneumonia.  ED Course: Temperature is 9078.  Blood pressure 147/98 with a pulse of 83 and respiratory rate of 25 oxygen sat 88% on room air.  His ABG showed pH of 7.387 PCO2 50 and PO2 87.9 on 2 L.  His hemoglobin is 11.9 with normal white count BUN 21.  Chest x-ray showed no acute findings.  Head CT is without contrast is negative  Review of Systems: As per HPI otherwise 10 point review of systems negative.    Past Medical History:  Diagnosis Date  . Allergic rhinitis, cause unspecified   . Anxiety state, unspecified   . Arthritis   . Benign neoplasm of colon   . Depressive disorder, not elsewhere classified   . Esophageal reflux   . Hyperlipidemia   . Irritable bowel syndrome   . Obstructive sleep apnea (adult) (pediatric)   . Other chronic allergic conjunctivitis   . Ulcer 1951   stomach    Past Surgical History:    Procedure Laterality Date  . APPENDECTOMY    . CATARACT EXTRACTION W/ INTRAOCULAR LENS  IMPLANT, BILATERAL  2003  . COLONOSCOPY    . ESOPHAGOGASTRODUODENOSCOPY    . TONSILLECTOMY       reports that he has never smoked. He has never used smokeless tobacco. He reports that he does not drink alcohol or use drugs.  Allergies  Allergen Reactions  . Codeine   . Zantac [Ranitidine]   . Amoxicillin Rash  . Penicillins Rash    Family History  Problem Relation Age of Onset  . Colon cancer Father   . Stroke Mother   . Heart attack Mother   . Colon cancer Mother   . Rectal cancer Neg Hx   . Stomach cancer Neg Hx     Prior to Admission medications   Medication Sig Start Date End Date Taking? Authorizing Provider  Cholecalciferol (VITAMIN D) 2000 UNITS tablet Take 2,000 Units by mouth daily.    [provider]  clidinium-chlordiazePOXIDE (LIBRAX) 2.5-5 MG per capsule Take 2 capsules by mouth 4 (four) times daily -  before meals and at bedtime. Pt states he only take 1 capsule 3-4 times a day    [provider]  flunisolide (NASAREL) 29 MCG/ACT (0.025%) nasal spray Place 2 sprays into the nose 2 (two) times daily. Dose is for each nostril. Use as directed.     [provider]  hydrOXYzine (  ATARAX/VISTARIL) 25 MG tablet Take one tablet at bedtime for itching. 10/08/15   Bobbitt, Sed[place] Muta, MD  ketorolac (ACULAR) 0.5 % ophthalmic solution 1 drop 4 (four) times daily. Use as directed.     [provider]  mometasone (ELOCON) 0.1 % ointment Apply topically daily. 10/08/15   Bobbitt, [place] Muta, MD  omeprazole (PRILOSEC) 20 MG capsule Take 20 mg by mouth 2 (two) times daily.      [provider]  pravastatin (PRAVACHOL) 10 MG tablet Take 10 mg by mouth daily.      [provider]  triamcinolone cream (KENALOG) 0.1 % Apply 1 application topically 2 (two) times daily.    [provider]    Physical Exam: Vitals:   02/13/18 2230  02/13/18 2232 02/13/18 2236 02/13/18 2303  BP: 118/69     Pulse: 60 69 83   Resp: 13 (!) 25 (!) 23   Temp:    97.8 F (36.6 C)  TempSrc:    Oral  SpO2: 97% (!) 89% (!) 88%   Weight:      Height:          Constitutional: NAD, calm, comfortable Vitals:   02/13/18 2230 02/13/18 2232 02/13/18 2236 02/13/18 2303  BP: 118/69     Pulse: 60 69 83   Resp: 13 (!) 25 (!) 23   Temp:    97.8 F (36.6 C)  TempSrc:    Oral  SpO2: 97% (!) 89% (!) 88%   Weight:      Height:       Pleasantly confused with no agitation Eyes: PERRL, lids and conjunctivae normal ENMT: Mucous membranes are moist. Posterior pharynx clear of any exudate or lesions.Normal dentition.  Neck: normal, supple, no masses, no thyromegaly Respiratory: clear to auscultation bilaterally, no wheezing, no crackles. Normal respiratory effort. No accessory muscle use.  Cardiovascular: Regular rate and rhythm, no murmurs / rubs / gallops. No extremity edema. 2+ pedal pulses. No carotid bruits.  Abdomen: no tenderness, no masses palpated. No hepatosplenomegaly. Bowel sounds positive.  Musculoskeletal: no clubbing / cyanosis. No joint deformity upper and lower extremities. Good ROM, no contractures. Normal muscle tone.  Skin: No induration, 2+ pedal edema with some lower extremity rashes  Neurologic: CN 2-12 grossly intact. Sensation intact, DTR normal. Strength 5/5 in all 4.  Psychiatric: Confused, lacks insight  Labs on Admission: I have personally reviewed following labs and imaging studies  CBC: Recent Labs  Lab 02/13/18 2118  WBC 4.2  NEUTROABS 1.9  HGB 11.9*  HCT 36.9*  MCV 90.9  PLT 329   Basic Metabolic Panel: Recent Labs  Lab 02/13/18 2118  NA 142  K 4.1  CL 105  CO2 32  GLUCOSE 105*  BUN 21*  CREATININE 1.01  CALCIUM 8.6*   GFR: Estimated Creatinine Clearance: 63.2 mL/min (by C-G formula based on SCr of 1.01 mg/dL). Liver Function Tests: Recent Labs  Lab 02/13/18 2118  AST 27  ALT 20    ALKPHOS 53  BILITOT 0.7  PROT 6.3*  ALBUMIN 3.5   No results for input(s): LIPASE, AMYLASE in the last 168 hours. No results for input(s): AMMONIA in the last 168 hours. Coagulation Profile: No results for input(s): INR, PROTIME in the last 168 hours. Cardiac Enzymes: No results for input(s): CKTOTAL, CKMB, CKMBINDEX, TROPONINI in the last 168 hours. BNP (last 3 results) No results for input(s): PROBNP in the last 8760 hours. HbA1C: No results for input(s): HGBA1C in the last 72 hours. CBG:  No results for input(s): GLUCAP in the last 168 hours. Lipid Profile: No results for input(s): CHOL, HDL, LDLCALC, TRIG, CHOLHDL, LDLDIRECT in the last 72 hours. Thyroid Function Tests: No results for input(s): TSH, T4TOTAL, FREET4, T3FREE, THYROIDAB in the last 72 hours. Anemia Panel: No results for input(s): VITAMINB12, FOLATE, FERRITIN, TIBC, IRON, RETICCTPCT in the last 72 hours. Urine analysis: No results found for: COLORURINE, APPEARANCEUR, LABSPEC, PHURINE, GLUCOSEU, HGBUR, BILIRUBINUR, KETONESUR, PROTEINUR, UROBILINOGEN, NITRITE, LEUKOCYTESUR Sepsis Labs: @LABRCNTIP (procalcitonin:4,lacticidven:4) )No results found for this or any previous visit (from the past 240 hour(s)).   Radiological Exams on Admission: Dg Chest 2 View  Result Date: 02/13/2018 CLINICAL DATA:  Confusion, lower extremity edema. EXAM: CHEST - 2 VIEW COMPARISON:  None. FINDINGS: The heart size and mediastinal contours are within normal limits. The aorta is tortuous. Both lungs are clear. The visualized skeletal structures are unremarkable. IMPRESSION: No active cardiopulmonary disease. Electronically Signed   By: Abelardo Diesel M.D.   On: 02/13/2018 21:23     Assessment/Plan Principal Problem:   Hypoxia Active Problems:   Anxiety state   OBSTRUCTIVE SLEEP APNEA   GERD   Edema   Hypoxemia   Dementia   Altered mental status     #1 altered mental status: Probably multifactorial.  This could reflect acute  delirium in the dementia patient.  Could be due to hypoxemia and could also be secondary to early infection.  Patient will be admitted and observed closely in the hospital.  Monitor his neuro status.  Safety precaution to avoid his wandering around.  Check urinalysis.  #2 hypoxemia: Patient is not visibly wheezing.  He could probably have this as a result of his known sleep apnea.  Early pneumonia cannot be ruled out.  Empirically placed on oxygen and monitor.  #3 obstructive sleep apnea: Initiate CPAP at night if tolerated.  #4 lower extremity edema: Questionable cellulitis.  I will hold off on antibiotics with no fever no white count.  This could be chronic dependent edema.  We may consider echocardiogram to evaluate his EF.  #6 dementia: No agitation.  Patient was mildly confused and wandering around.  He is at risk and danger to himself so we will get a sitter for safety reason     DVT prophylaxis: Lovenox Code Status: Full code Family Communication: None at the moment Disposition Plan: To be determined but most likely skilled facility Consults called: None Admission status: Inpatient Severity of Illness: The appropriate patient status for this patient is INPATIENT. Inpatient status is judged to be reasonable and necessary in order to provide the required intensity of service to ensure the patient's safety. The patient's presenting symptoms, physical exam findings, and initial radiographic and laboratory data in the context of their chronic comorbidities is felt to place them at high risk for further clinical deterioration. Furthermore, it is not anticipated that the patient will be medically stable for discharge from the hospital within 2 midnights of admission. The following factors support the patient status of inpatient.   " The patient's presenting symptoms include confusion. " The worrisome physical exam findings include altered mental status. " The initial radiographic and  laboratory data are worrisome because of hypoxemia. " The chronic co-morbidities include dementia.   * I certify that at the point of admission it is my clinical judgment that the patient will require inpatient hospital care spanning beyond 2 midnights from the point of admission due to high intensity of service, high risk for further deterioration and high frequency of  surveillance required.Barbette Merino MD [facility] Pager [phone]  If 7PM-7AM, please contact night-coverage [email or web address] Password [email or web address]  02/13/2018, 11:06 PM

## 2018-02-13 NOTE — ED Notes (Signed)
Pt having episodes of oxygen desaturation witnessed by sitter with wave formation.

## 2018-02-13 NOTE — ED Notes (Signed)
[name] [name], Uriah notified of oxygen saturations.

## 2018-02-13 NOTE — ED Triage Notes (Signed)
PT presented from floor after visiting with spouse and staff reported that pt had been wandering and exhibiting more confusion. Pt not oriented to date and place.

## 2018-02-14 ENCOUNTER — Inpatient Hospital Stay (HOSPITAL_COMMUNITY): Payer: Medicare Other

## 2018-02-14 ENCOUNTER — Encounter (HOSPITAL_COMMUNITY): Payer: Self-pay | Admitting: Radiology

## 2018-02-14 LAB — COMPREHENSIVE METABOLIC PANEL
ALT: 16 U/L — ABNORMAL LOW (ref 17–63)
AST: 20 U/L (ref 15–41)
Albumin: 2.7 g/dL — ABNORMAL LOW (ref 3.5–5.0)
Alkaline Phosphatase: 40 U/L (ref 38–126)
Anion gap: 4 — ABNORMAL LOW (ref 5–15)
BUN: 20 mg/dL (ref 6–20)
CO2: 30 mmol/L (ref 22–32)
Calcium: 7.9 mg/dL — ABNORMAL LOW (ref 8.9–10.3)
Chloride: 106 mmol/L (ref 101–111)
Creatinine, Ser: 0.82 mg/dL (ref 0.61–1.24)
GFR calc Af Amer: >60 mL/min (ref >60)
GFR calc non Af Amer: >60 mL/min (ref >60)
Glucose, Bld: 100 mg/dL — ABNORMAL HIGH (ref 65–99)
Potassium: 4.2 mmol/L (ref 3.5–5.1)
Sodium: 140 mmol/L (ref 135–145)
Total Bilirubin: 0.5 mg/dL (ref 0.3–1.2)
Total Protein: 5 g/dL — ABNORMAL LOW (ref 6.5–8.1)

## 2018-02-14 LAB — CBC
HCT: 32.4 % — ABNORMAL LOW (ref 39.0–52.0)
HEMOGLOBIN: 10.4 g/dL — AB (ref 13.0–17.0)
MCH: 29.3 pg (ref 26.0–34.0)
MCHC: 32.1 g/dL (ref 30.0–36.0)
MCV: 91.3 fL (ref 78.0–100.0)
PLATELETS: 223 10*3/uL (ref 150–400)
RBC: 3.55 MIL/uL — AB (ref 4.22–5.81)
RDW: 14.4 % (ref 11.5–15.5)
WBC: 4.3 10*3/uL (ref 4.0–10.5)

## 2018-02-14 LAB — RAPID URINE DRUG SCREEN, HOSP PERFORMED
AMPHETAMINES: NOT DETECTED
BENZODIAZEPINES: POSITIVE — AB
Barbiturates: NOT DETECTED
COCAINE: NOT DETECTED
Opiates: NOT DETECTED
Tetrahydrocannabinol: NOT DETECTED

## 2018-02-14 LAB — URINALYSIS, ROUTINE W REFLEX MICROSCOPIC
BILIRUBIN URINE: NEGATIVE
Bilirubin Urine: NEGATIVE
GLUCOSE, UA: NEGATIVE mg/dL
Glucose, UA: NEGATIVE mg/dL
HGB URINE DIPSTICK: NEGATIVE
Hgb urine dipstick: NEGATIVE
Ketones, ur: NEGATIVE mg/dL
Ketones, ur: NEGATIVE mg/dL
Leukocytes, UA: NEGATIVE
Leukocytes, UA: NEGATIVE
Nitrite: NEGATIVE
Nitrite: NEGATIVE
PH: 7 (ref 5.0–8.0)
Protein, ur: NEGATIVE mg/dL
Protein, ur: NEGATIVE mg/dL
SPECIFIC GRAVITY, URINE: 1.012 (ref 1.005–1.030)
Specific Gravity, Urine: 1.01 (ref 1.005–1.030)
pH: 7 (ref 5.0–8.0)

## 2018-02-14 LAB — GLUCOSE, CAPILLARY: GLUCOSE-CAPILLARY: 101 mg/dL — AB (ref 65–99)

## 2018-02-14 MED ORDER — ENOXAPARIN SODIUM 40 MG/0.4ML ~~LOC~~ SOLN
40.0000 mg | Freq: Every day | SUBCUTANEOUS | Status: DC
  Administered 2018-02-14 – 2018-03-07 (×22): 40 mg via SUBCUTANEOUS
  Filled 2018-02-14 (×21): qty 0.4

## 2018-02-14 MED ORDER — PRAVASTATIN SODIUM 20 MG PO TABS
10.0000 mg | ORAL_TABLET | Freq: Every day | ORAL | Status: DC
  Administered 2018-02-15 – 2018-03-08 (×22): 10 mg via ORAL
  Filled 2018-02-14 (×23): qty 1

## 2018-02-14 MED ORDER — SODIUM CHLORIDE 0.9 % IV SOLN
INTRAVENOUS | Status: DC
  Administered 2018-02-14: via INTRAVENOUS

## 2018-02-14 NOTE — Progress Notes (Signed)
PROGRESS NOTE    [name] [name]  QIH:[id number] DOB: [birth year] DOA: 02/13/2018 PCP: Leanna Battles, MD    Brief Narrative:  84 year old with past medical history relevant for dementia, hyperlipidemia, possible mood disorder who was initially visiting his wife who was hospitalized for diarrhea.  She subsequently had a code event and was transferred to the ICU.  Patient was noted to be altered and quite confused and initially hypoxic.  Patient was admitted for altered mental status and hypoxia.   Assessment & Plan:   Principal Problem:   Hypoxia Active Problems:   Anxiety state   OBSTRUCTIVE SLEEP APNEA   GERD   Edema   Hypoxemia   Dementia   Altered mental status   #) hypoxia: Patient initially was noted to be hypoxic.  His lung exam reportedly was normal.  His chest x-ray showed no evidence of pneumonia.  His hypoxia resolved and he was taken off oxygen.  #) Altered mental status/dementia: At this time patient's baseline mental status is unknown.  He continues to be quite confused and inattentive.  Unclear if this is related to the event that happened with his wife versus possibly underlying infection or even polypharmacy.  Per review of the chart patient is on clidinium chlordiazepoxide and was seen taking excessive pills of these. -UA urine culture ordered -Noncontrast head CT ordered - We will evaluate to see if patient is still on donepezil -Hold clidinium chlordiazepoxide  #) Hyperlipidemia: -Continue pravastatin 10 mg daily  Fluids: Tolerating p.o. Elect lites: Monitor and supplement Nutrition: Heart healthy diet  Prophylaxis: Enoxaparin  Disposition: Pending evaluation by PT and resolution of altered mental status  Full code   Consultants:   None  Procedures:   None  Antimicrobials:   None   Subjective: Patient is sleeping in bed.  He reports that he is at home.  He reports that the sheets are very cold.  He does not have any complaints  including no nausea, vomiting, abdominal pain, cough, congestion.  Objective: Vitals:   02/13/18 2330 02/13/18 2345 02/14/18 0009 02/14/18 0524  BP: (!) 107/51  127/88 (!) 109/53  Pulse: 64 82 69 66  Resp: 15 18 20 18   Temp:   97.9 F (36.6 C) 98 F (36.7 C)  TempSrc:   Oral Axillary  SpO2: 97% 99% 99% 94%  Weight:   75.8 kg (167 lb 1.7 oz)   Height:        Intake/Output Summary (Last 24 hours) at 02/14/2018 1013 Last data filed at 02/14/2018 0115 Gross per 24 hour  Intake -  Output 450 ml  Net -450 ml   Filed Weights   02/13/18 2030 02/14/18 0009  Weight: 82.1 kg (181 lb) 75.8 kg (167 lb 1.7 oz)    Examination:  General exam: No acute distress Respiratory system: Clear to auscultation. Respiratory effort normal. Cardiovascular system: Regular rate and rhythm, no murmurs Gastrointestinal system: Abdomen is nondistended, soft and nontender. No organomegaly or masses felt. Normal bowel sounds heard. Central nervous system: No focal neurological deficits.  Alert only to self. Extremities: Trace lower extremity edema Skin: No rashes on visible skin Psychiatry: Unable to assess due to medical condition    Data Reviewed: I have personally reviewed following labs and imaging studies  CBC: Recent Labs  Lab 02/13/18 2118 02/14/18 0806  WBC 4.2 4.3  NEUTROABS 1.9  --   HGB 11.9* 10.4*  HCT 36.9* 32.4*  MCV 90.9 91.3  PLT 281 875   Basic Metabolic Panel: Recent  Labs  Lab 02/13/18 2118 02/14/18 0806  NA 142 140  K 4.1 4.2  CL 105 106  CO2 32 30  GLUCOSE 105* 100*  BUN 21* 20  CREATININE 1.01 0.82  CALCIUM 8.6* 7.9*   GFR: Estimated Creatinine Clearance: 71.9 mL/min (by C-G formula based on SCr of 0.82 mg/dL). Liver Function Tests: Recent Labs  Lab 02/13/18 2118 02/14/18 0806  AST 27 20  ALT 20 16*  ALKPHOS 53 40  BILITOT 0.7 0.5  PROT 6.3* 5.0*  ALBUMIN 3.5 2.7*   No results for input(s): LIPASE, AMYLASE in the last 168 hours. No results for  input(s): AMMONIA in the last 168 hours. Coagulation Profile: No results for input(s): INR, PROTIME in the last 168 hours. Cardiac Enzymes: No results for input(s): CKTOTAL, CKMB, CKMBINDEX, TROPONINI in the last 168 hours. BNP (last 3 results) No results for input(s): PROBNP in the last 8760 hours. HbA1C: No results for input(s): HGBA1C in the last 72 hours. CBG: No results for input(s): GLUCAP in the last 168 hours. Lipid Profile: No results for input(s): CHOL, HDL, LDLCALC, TRIG, CHOLHDL, LDLDIRECT in the last 72 hours. Thyroid Function Tests: No results for input(s): TSH, T4TOTAL, FREET4, T3FREE, THYROIDAB in the last 72 hours. Anemia Panel: No results for input(s): VITAMINB12, FOLATE, FERRITIN, TIBC, IRON, RETICCTPCT in the last 72 hours. Sepsis Labs: No results for input(s): PROCALCITON, LATICACIDVEN in the last 168 hours.  No results found for this or any previous visit (from the past 240 hour(s)).       Radiology Studies: Dg Chest 2 View  Result Date: 02/13/2018 CLINICAL DATA:  Confusion, lower extremity edema. EXAM: CHEST - 2 VIEW COMPARISON:  None. FINDINGS: The heart size and mediastinal contours are within normal limits. The aorta is tortuous. Both lungs are clear. The visualized skeletal structures are unremarkable. IMPRESSION: No active cardiopulmonary disease. Electronically Signed   By: Abelardo Diesel M.D.   On: 02/13/2018 21:23        Scheduled Meds: . enoxaparin (LOVENOX) injection  40 mg Subcutaneous QHS  . pravastatin  10 mg Oral Daily   Continuous Infusions: . sodium chloride 75 mL/hr at 02/14/18 0013     LOS: 1 day    Time spent: [place], MD [facility]  If 7PM-7AM, please contact night-coverage [email or web address] Password [email or web address] 02/14/2018, 10:13 AM

## 2018-02-14 NOTE — Evaluation (Signed)
Physical Therapy Evaluation Patient Details Name: [name] [name] MRN: [id number] DOB: Mar 19, 1931 Today's Date: 02/14/2018   History of Present Illness  84 year old with past medical history relevant for dementia, hyperlipidemia, possible mood disorder who was initially visiting his wife who was hospitalized for diarrhea.  She subsequently had a code event and was transferred to the ICU.  Patient was noted to be altered and quite confused and initially hypoxic.  Patient was admitted for altered mental status and hypoxia.  Clinical Impression  Patient presents with decreased safety and independence due to spouse who was caretaker now inpatient.  He presents with likely baseline balance deficits and currently min A for high level balance activities.  He will benefit from skilled PT during acute stay and may need placement following acute stay due to inability to care for himself.  PT to follow.    Follow Up Recommendations Supervision/Assistance - 24 hour    Equipment Recommendations  None recommended by PT    Recommendations for Other Services       Precautions / Restrictions Precautions Precautions: Fall      Mobility  Bed Mobility Overal bed mobility: Modified Independent                Transfers Overall transfer level: Modified independent Equipment used: None             General transfer comment: safe technique unaided from bed  Ambulation/Gait Ambulation/Gait assistance: Supervision;Min guard Ambulation Distance (Feet): 400 Feet Assistive device: None Gait Pattern/deviations: Step-through pattern;Decreased stride length;Drifts right/left;Trunk flexed;Shuffle     General Gait Details: able to walk unassisted, but demonstrates gait deviations including crouch gait with forward head and slight anterior bias with decreased ankle DF and risk for falls  Stairs            Wheelchair Mobility    Modified Rankin (Stroke Patients Only)       Balance  Overall balance assessment: Needs assistance Sitting-balance support: No upper extremity supported;Feet supported Sitting balance-Leahy Scale: Good       Standing balance-Leahy Scale: Fair Standing balance comment: no UE support in standing, but demonstrates gait deviations putting pt at higher risk for falls along wtih decreased focus and easily distracted                             Pertinent Vitals/Pain Pain Assessment: No/denies pain(does report arthritic pain at times in legs)    Home Living Family/patient expects to be discharged to:: Private residence Living Arrangements: Spouse/significant other                    Prior Function           Comments: Patient unable to accurately give information due to dementia, wife is in critical care and, though pt is from home he relies on his wife due to his dementia     Hand Dominance        Extremity/Trunk Assessment   Upper Extremity Assessment Upper Extremity Assessment: Overall WFL for tasks assessed    Lower Extremity Assessment Lower Extremity Assessment: Generalized weakness    Cervical / Trunk Assessment Cervical / Trunk Assessment: Kyphotic;Other exceptions Cervical / Trunk Exceptions: kyphoscoliosis  Communication   Communication: HOH  Cognition Arousal/Alertness: Awake/alert Behavior During Therapy: WFL for tasks assessed/performed(hyperverbal) Overall Cognitive Status: No family/caregiver present to determine baseline cognitive functioning Area of Impairment: Orientation;Memory  Orientation Level: Place;Time;Situation   Memory: Decreased short-term memory;Decreased recall of precautions                General Comments      Exercises     Assessment/Plan    PT Assessment Patient needs continued PT services  PT Problem List Decreased coordination;Decreased knowledge of precautions;Decreased safety awareness;Decreased mobility;Decreased balance;Decreased  knowledge of use of DME       PT Treatment Interventions DME instruction;Gait training;Therapeutic activities;Stair training;Therapeutic exercise;Balance training;Functional mobility training;Patient/family education    PT Goals (Current goals can be found in the Care Plan section)  Acute Rehab PT Goals PT Goal Formulation: Patient unable to participate in goal setting Time For Goal Achievement: 02/28/18 Potential to Achieve Goals: Fair    Frequency Min 3X/week   Barriers to discharge        Co-evaluation               AM-PAC PT "6 Clicks" Daily Activity  Outcome Measure Difficulty turning over in bed (including adjusting bedclothes, sheets and blankets)?: None Difficulty moving from lying on back to sitting on the side of the bed? : A Little Difficulty sitting down on and standing up from a chair with arms (e.g., wheelchair, bedside commode, etc,.)?: A Little Help needed moving to and from a bed to chair (including a wheelchair)?: A Little Help needed walking in hospital room?: A Little Help needed climbing 3-5 steps with a railing? : A Little 6 Click Score: 19    End of Session Equipment Utilized During Treatment: Gait belt Activity Tolerance: Patient tolerated treatment well Patient left: in bed;with call bell/phone within reach;with bed alarm set   PT Visit Diagnosis: Other abnormalities of gait and mobility (R26.89)    Time: 7510-2585 PT Time Calculation (min) (ACUTE ONLY): 22 min   Charges:   PT Evaluation $PT Eval Moderate Complexity: 1 Mod     PT G CodesMagda Kiel, [place] [phone] 02/14/2018   Reginia Naas 02/14/2018, 3:22 PM

## 2018-02-14 NOTE — Progress Notes (Signed)
Pt admitted from ED with safety sitter at beside due to wandering/elopement risk. Pt has dementia. Pt was actually here earlier with his wife who was a pt that coded and transferred to ICU. Pt has no understanding of these events. Unable to complete admission history due to pts AMS- he constantly talks about various things that are not related to the situation at hand, and it is very difficult to get his attention to get him to answer a question. Pt appears to be itching all over and has redness, swelling and excoriation to BLE. Will continue to monitor. Hortencia Conradi RN

## 2018-02-15 DIAGNOSIS — F411 Generalized anxiety disorder: Secondary | ICD-10-CM

## 2018-02-15 DIAGNOSIS — R410 Disorientation, unspecified: Secondary | ICD-10-CM

## 2018-02-15 LAB — URINE CULTURE: Culture: NO GROWTH

## 2018-02-15 MED ORDER — PERMETHRIN 5 % EX CREA
TOPICAL_CREAM | Freq: Once | CUTANEOUS | Status: AC
  Administered 2018-02-15: 13:00:00 via TOPICAL
  Filled 2018-02-15: qty 60

## 2018-02-15 MED ORDER — DIPHENHYDRAMINE HCL 12.5 MG/5ML PO ELIX
12.5000 mg | ORAL_SOLUTION | Freq: Once | ORAL | Status: AC
  Administered 2018-02-15: 12.5 mg via ORAL
  Filled 2018-02-15: qty 5

## 2018-02-15 MED ORDER — DIPHENHYDRAMINE HCL 12.5 MG/5ML PO ELIX
12.5000 mg | ORAL_SOLUTION | Freq: Three times a day (TID) | ORAL | Status: DC | PRN
  Administered 2018-02-16 – 2018-02-23 (×11): 12.5 mg via ORAL
  Filled 2018-02-15 (×14): qty 5

## 2018-02-15 MED ORDER — DIPHENHYDRAMINE HCL 50 MG/ML IJ SOLN
12.5000 mg | Freq: Once | INTRAMUSCULAR | Status: AC
  Administered 2018-02-15: 12.5 mg via INTRAVENOUS
  Filled 2018-02-15: qty 1
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
## 2018-02-15 NOTE — Progress Notes (Signed)
PROGRESS NOTE  [name] [name] EQA:[id number] DOB: [birth year] DOA: 02/13/2018 PCP: Leanna Battles, MD  HPI/Recap of past 24 hours: [name] [name] is an 84 year old male with past medical history significant for dementia, hyperlipidemia, who was initially visiting his wife, his only caretaker.  She subsequently had a code blue and deceased on 2018/03/10.  Prior to this event medical staff had noted patient was dyspneic and confused while visiting his wife.  He was subsequently admitted for altered mental status and hypoxia.  02/15/2018: Patient seen and examined at his bedside.  He is alert but confused in the setting of dementia.  Rash noted affecting the anterior right upper extremity as well as back lower extremities abdomen. Pruritis noted on examination with suspicion for scabies.  Permethrin ordered.  Unclear how long he has had this pruritus and rash or if anyone else in his household or living area affected by this as his wife and only caretaker deceased yesterday Mar 10, 2018.  Unable to obtain a review of systems due to confusion in the state of dementia.   Assessment/Plan: Principal Problem:   Hypoxia Active Problems:   Anxiety state   OBSTRUCTIVE SLEEP APNEA   GERD   Edema   Hypoxemia   Dementia   Altered mental status  Acute hypoxic respiratory failure of unclear etiology Chest x-ray unremarkable for lobular infiltrates Hypoxia has resolved  Acute metabolic encephalopathy in the setting of dementia Unclear what his baseline is Caseworker working on obtaining more information regarding guardianship Landscape architect at bedside Reorient as needed Fall precaution CT head done on 03/10/18 no intracranial abnormalities  Suspected scabies Started permethrin cream Obtain scabies testing to confirm Contact precaution  B/l LE chronic stasis dermatitis Monitor for sign of super imposed bacterial infection  Hyperlipidemia Continue pravastatin  Normocytic anemia Hemoglobin  10.7 No sign of overt bleeding Unclear of his baseline Repeat CBC in the morning  Code Status: Full code suspected scabies  Family Communication: None at bedside  Disposition Plan: SNF; CSW attempting to contact family/relatives/friends for guardianship   Consultants:  CSW  Procedures:  None  Antimicrobials:  NOne  DVT prophylaxis:  sq lovenox   Objective: Vitals:   03/10/2018 1444 March 10, 2018 2048 02/15/18 0628 02/15/18 1317  BP: 115/60 123/67 127/73 114/65  Pulse: 68 60 63 63  Resp: 18 18 16 14   Temp: 98.4 F (36.9 C) 97.8 F (36.6 C) 98 F (36.7 C) 98.1 F (36.7 C)  TempSrc: Axillary Oral Oral Oral  SpO2: 94% 99% 97% 98%  Weight:      Height:        Intake/Output Summary (Last 24 hours) at 02/15/2018 1618 Last data filed at 02/15/2018 1454 Gross per 24 hour  Intake 1440 ml  Output 300 ml  Net 1140 ml   Filed Weights   02/13/18 2030 March 10, 2018 0009  Weight: 82.1 kg (181 lb) 75.8 kg (167 lb 1.7 oz)    Exam:  . General: 84 y.o. year-old male well developed well nourished in no acute distress.  Alert but confused in the setting of dementia. . Cardiovascular: Regular rate and rhythm with no rubs or gallops.  No thyromegaly or JVD noted.   Marland Kitchen Respiratory: Clear to auscultation with no wheezes or rales. Good inspiratory effort. . Abdomen: Soft nontender nondistended with normal bowel sounds x4 quadrants. . Musculoskeletal: No lower extremity edema. 2/4 pulses in all 4 extremities. . Skin: b/l lower extremity chronic stasis dermatitis. Rash, pruritus affecting anterior right upper extremity, lowe extremities, abdomen. Marland Kitchen Psychiatry: Mood  is appropriate for condition and setting   Data Reviewed: CBC: Recent Labs  Lab 02/13/18 2118 02/14/18 0806  WBC 4.2 4.3  NEUTROABS 1.9  --   HGB 11.9* 10.4*  HCT 36.9* 32.4*  MCV 90.9 91.3  PLT 281 716   Basic Metabolic Panel: Recent Labs  Lab 02/13/18 2118 02/14/18 0806  NA 142 140  K 4.1 4.2  CL 105 106  CO2  32 30  GLUCOSE 105* 100*  BUN 21* 20  CREATININE 1.01 0.82  CALCIUM 8.6* 7.9*   GFR: Estimated Creatinine Clearance: 71.9 mL/min (by C-G formula based on SCr of 0.82 mg/dL). Liver Function Tests: Recent Labs  Lab 02/13/18 2118 02/14/18 0806  AST 27 20  ALT 20 16*  ALKPHOS 53 40  BILITOT 0.7 0.5  PROT 6.3* 5.0*  ALBUMIN 3.5 2.7*   No results for input(s): LIPASE, AMYLASE in the last 168 hours. No results for input(s): AMMONIA in the last 168 hours. Coagulation Profile: No results for input(s): INR, PROTIME in the last 168 hours. Cardiac Enzymes: No results for input(s): CKTOTAL, CKMB, CKMBINDEX, TROPONINI in the last 168 hours. BNP (last 3 results) No results for input(s): PROBNP in the last 8760 hours. HbA1C: No results for input(s): HGBA1C in the last 72 hours. CBG: Recent Labs  Lab 02/14/18 1211  GLUCAP 101*   Lipid Profile: No results for input(s): CHOL, HDL, LDLCALC, TRIG, CHOLHDL, LDLDIRECT in the last 72 hours. Thyroid Function Tests: No results for input(s): TSH, T4TOTAL, FREET4, T3FREE, THYROIDAB in the last 72 hours. Anemia Panel: No results for input(s): VITAMINB12, FOLATE, FERRITIN, TIBC, IRON, RETICCTPCT in the last 72 hours. Urine analysis:    Component Value Date/Time   COLORURINE YELLOW 02/14/2018 1021   APPEARANCEUR CLEAR 02/14/2018 1021   LABSPEC 1.010 02/14/2018 1021   PHURINE 7.0 02/14/2018 1021   GLUCOSEU NEGATIVE 02/14/2018 1021   HGBUR NEGATIVE 02/14/2018 1021   BILIRUBINUR NEGATIVE 02/14/2018 1021   [place] 02/14/2018 1021   PROTEINUR NEGATIVE 02/14/2018 1021   NITRITE NEGATIVE 02/14/2018 1021   LEUKOCYTESUR NEGATIVE 02/14/2018 1021   Sepsis Labs: @LABRCNTIP (procalcitonin:4,lacticidven:4)  ) Recent Results (from the past 240 hour(s))  Culture, Urine     Status: None   Collection Time: 02/14/18 10:21 AM  Result Value Ref Range Status   Specimen Description   Final    URINE, RANDOM Performed at [facility], [place] [address], [place], [place] [address]    Special Requests   Final    NONE Performed at [facility], [place] [address] [place], [place] [address]    Culture   Final    NO GROWTH Performed at [facility], [place] [address] [place], [place] [address]    Report Status 02/15/2018 FINAL  Final      Studies: No results found.  Scheduled Meds: . enoxaparin (LOVENOX) injection  40 mg Subcutaneous QHS  . pravastatin  10 mg Oral Daily    Continuous Infusions:   LOS: 2 days     Kayleen Memos, MD [facility] Pager [phone]  If 7PM-7AM, please contact night-coverage [email or web address] Password [facility] 02/15/2018, 4:18 PM

## 2018-02-16 DIAGNOSIS — A480 Gas gangrene: Secondary | ICD-10-CM

## 2018-02-16 LAB — MRSA PCR SCREENING: MRSA by PCR: NEGATIVE

## 2018-02-16 MED ORDER — TRAZODONE HCL 50 MG PO TABS
50.0000 mg | ORAL_TABLET | Freq: Once | ORAL | Status: AC
  Administered 2018-02-16: 50 mg via ORAL
  Filled 2018-02-16: qty 1

## 2018-02-16 MED ORDER — DOXYCYCLINE HYCLATE 100 MG PO TABS
100.0000 mg | ORAL_TABLET | Freq: Two times a day (BID) | ORAL | Status: DC
  Administered 2018-02-16 – 2018-02-24 (×17): 100 mg via ORAL
  Filled 2018-02-16 (×17): qty 1

## 2018-02-16 MED ORDER — LORAZEPAM 0.5 MG PO TABS
0.5000 mg | ORAL_TABLET | Freq: Once | ORAL | Status: AC
Start: 2018-02-16 — End: 2018-02-16
  Administered 2018-02-16: 0.5 mg via ORAL
  Filled 2018-02-16: qty 1

## 2018-02-16 NOTE — Progress Notes (Signed)
Pt confused, unsure if he wears CPAP or not.  Pt refuses to put CPAP on.  Will pull machine from room.

## 2018-02-16 NOTE — Progress Notes (Signed)
PT Cancellation Note  Patient Details Name: [name] [name] MRN: [id number] DOB: 1931-07-27   Cancelled Treatment:    Reason Eval/Treat Not Completed: Medical issues which prohibited therapy.  Pt has extremely uncontrolled BP and will try at another time.   Ramond Dial 02/16/2018, 9:20 AM   Mee Hives, PT MS Acute Rehab Dept. Number: [place]

## 2018-02-16 NOTE — Progress Notes (Addendum)
PROGRESS NOTE  [name] [name] CHY:[id number] DOB: 28-Sep-1930 DOA: 02/13/2018 PCP: Leanna Battles, MD  HPI/Recap of past 24 hours: [name] [name] is an 84 year old male with past medical history significant for dementia, hyperlipidemia, who was initially visiting his wife, his only caretaker.  She subsequently had a code blue and deceased on 02/17/2018.  Prior to this event medical staff had noted patient was dyspneic and confused while visiting his wife.  He was subsequently admitted for altered mental status and hypoxia.  02/15/2018: Patient seen and examined at his bedside.  He is alert but confused in the setting of dementia.  Rash noted affecting the anterior right upper extremity as well as back lower extremities abdomen. Pruritis noted on examination with suspicion for scabies.  Permethrin ordered.  Unclear how long he has had this pruritus and rash or if anyone else in his household or living area affected by this as his wife and only caretaker deceased yesterday 02/17/2018.  Unable to obtain a review of systems due to confusion in the state of dementia.  02/16/18: no new complaints.    Assessment/Plan: Principal Problem:   Hypoxia Active Problems:   Anxiety state   OBSTRUCTIVE SLEEP APNEA   GERD   Edema   Hypoxemia   Dementia   Altered mental status  Acute hypoxic respiratory failure of unclear etiology Chest x-ray unremarkable for lobular infiltrates Hypoxia has resolved  Acute metabolic encephalopathy in the setting of dementia Unclear what his baseline is Caseworker working on obtaining more information regarding guardianship Landscape architect at bedside Reorient as needed Fall precaution CT head done on 02-17-2018 no intracranial abnormalities  Suspected scabies Started permethrin cream Obtain scabies testing to confirm Contact precaution  B/l LE chronic stasis dermatitis/suspected b/l LE cellulitis Start doxycycline 100 mg po bid  Hyperlipidemia Continue  pravastatin  Normocytic anemia Hemoglobin 10.7 No sign of overt bleeding Unclear of his baseline Repeat CBC in the morning  Code Status: Full code suspected scabies  Family Communication: None at bedside  Disposition Plan: SNF; CSW attempting to contact family/relatives/friends for guardianship   Consultants:  CSW  Wound care specialist  Procedures:  None  Antimicrobials:  NOne  DVT prophylaxis:  sq lovenox   Objective: Vitals:   02/16/18 0500 02/16/18 0900 02/16/18 1100 02/16/18 1453  BP: (!) 126/48 (!) 140/129 129/73 139/87  Pulse: 73 90  67  Resp: 13 18  20   Temp: 97.8 F (36.6 C) 98.4 F (36.9 C)  98.4 F (36.9 C)  TempSrc: Oral Oral  Oral  SpO2: 99% 100%  100%  Weight:      Height:        Intake/Output Summary (Last 24 hours) at 02/16/2018 1533 Last data filed at 02/16/2018 1456 Gross per 24 hour  Intake 480 ml  Output -  Net 480 ml   Filed Weights   02/13/18 2030 17-Feb-2018 0009  Weight: 82.1 kg (181 lb) 75.8 kg (167 lb 1.7 oz)    Exam:  . General: 84 y.o. year-old male WD WN NAD Alert in the setting of advanced dementia. Cardiovascular: RRR no rubs or gallops. No JVD or thyromegaly. Marland Kitchen Respiratory: Clear to auscultation with no wheezes or rales. Good inspiratory effort. . Abdomen: Soft nontender nondistended with normal bowel sounds x4 quadrants. . Musculoskeletal: No lower extremity edema. 2/4 pulses in all 4 extremities. . Skin: b/l lower extremity chronic stasis dermatitis. Rash, pruritus affecting anterior right upper extremity, lowe extremities, abdomen. LE erythema from ankle down. Marland Kitchen Psychiatry: Mood is appropriate  for condition and setting   Data Reviewed: CBC: Recent Labs  Lab 02/13/18 2118 02/14/18 0806  WBC 4.2 4.3  NEUTROABS 1.9  --   HGB 11.9* 10.4*  HCT 36.9* 32.4*  MCV 90.9 91.3  PLT 281 892   Basic Metabolic Panel: Recent Labs  Lab 02/13/18 2118 02/14/18 0806  NA 142 140  K 4.1 4.2  CL 105 106  CO2 32 30    GLUCOSE 105* 100*  BUN 21* 20  CREATININE 1.01 0.82  CALCIUM 8.6* 7.9*   GFR: Estimated Creatinine Clearance: 71.9 mL/min (by C-G formula based on SCr of 0.82 mg/dL). Liver Function Tests: Recent Labs  Lab 02/13/18 2118 02/14/18 0806  AST 27 20  ALT 20 16*  ALKPHOS 53 40  BILITOT 0.7 0.5  PROT 6.3* 5.0*  ALBUMIN 3.5 2.7*   No results for input(s): LIPASE, AMYLASE in the last 168 hours. No results for input(s): AMMONIA in the last 168 hours. Coagulation Profile: No results for input(s): INR, PROTIME in the last 168 hours. Cardiac Enzymes: No results for input(s): CKTOTAL, CKMB, CKMBINDEX, TROPONINI in the last 168 hours. BNP (last 3 results) No results for input(s): PROBNP in the last 8760 hours. HbA1C: No results for input(s): HGBA1C in the last 72 hours. CBG: Recent Labs  Lab 02/14/18 1211  GLUCAP 101*   Lipid Profile: No results for input(s): CHOL, HDL, LDLCALC, TRIG, CHOLHDL, LDLDIRECT in the last 72 hours. Thyroid Function Tests: No results for input(s): TSH, T4TOTAL, FREET4, T3FREE, THYROIDAB in the last 72 hours. Anemia Panel: No results for input(s): VITAMINB12, FOLATE, FERRITIN, TIBC, IRON, RETICCTPCT in the last 72 hours. Urine analysis:    Component Value Date/Time   COLORURINE YELLOW 02/14/2018 1021   APPEARANCEUR CLEAR 02/14/2018 1021   LABSPEC 1.010 02/14/2018 1021   PHURINE 7.0 02/14/2018 1021   GLUCOSEU NEGATIVE 02/14/2018 1021   HGBUR NEGATIVE 02/14/2018 1021   BILIRUBINUR NEGATIVE 02/14/2018 1021   [place] 02/14/2018 1021   PROTEINUR NEGATIVE 02/14/2018 1021   NITRITE NEGATIVE 02/14/2018 1021   LEUKOCYTESUR NEGATIVE 02/14/2018 1021   Sepsis Labs: @LABRCNTIP (procalcitonin:4,lacticidven:4)  ) Recent Results (from the past 240 hour(s))  Culture, Urine     Status: None   Collection Time: 02/14/18 10:21 AM  Result Value Ref Range Status   Specimen Description   Final    URINE, RANDOM Performed at [facility], [place] [address], [place], [place] [address]    Special Requests   Final    NONE Performed at [facility], [place] [address] [place], [place] [address]    Culture   Final    NO GROWTH Performed at [facility], [place] [address], [place], [place] [address]    Report Status 02/15/2018 FINAL  Final  MRSA PCR Screening     Status: None   Collection Time: 02/16/18  9:45 AM  Result Value Ref Range Status   MRSA by PCR NEGATIVE NEGATIVE Final    Comment:        The GeneXpert MRSA Assay (FDA approved for NASAL specimens only), is one component of a comprehensive MRSA colonization surveillance program. It is not intended to diagnose MRSA infection nor to guide or monitor treatment for MRSA infections. Performed at [facility], [place] [address] [place], [place] [address]       Studies: No results found.  Scheduled Meds: . doxycycline  100 mg Oral Q12H  . enoxaparin (LOVENOX) injection  40 mg Subcutaneous QHS  .  pravastatin  10 mg Oral Daily    Continuous Infusions:   LOS: 3 days     Kayleen Memos, MD [facility] Pager [phone]  If 7PM-7AM, please contact night-coverage [email or web address] Password [facility] 02/16/2018, 3:33 PM

## 2018-02-16 NOTE — Consult Note (Signed)
[facility] Nurse wound consult note Reason for Consult: Blateral LE superficial wounds Wound type: Areas on feet and lower legs consistent with scabies.  Order indicates he was treated yesterday for this condition.  If the patient remains hospitalized in 10 days - 2 weeks, please consider additional treatment. Bilateral lower legs and feet with areas consistent with scabies infestation.  No open wounds, no additional treatment indicated at this time. Monitor the wound area(s) for worsening of condition such as: Signs/symptoms of infection,  Increase in size,  Development of or worsening of odor, Development of pain, or increased pain at the affected locations.  Notify the medical team if any of these develop.  Thank you for the consult.  Discussed plan of care with the patient and bedside nurse.  [place] nurse will not follow at this time.  Please re-consult the [facility] team if needed.  Val Riles, RN, MSN, CWOCN, CNS-BC, pager [phone]

## 2018-02-16 NOTE — Progress Notes (Signed)
Pt confused, refuses cpap.  RX changed to prn.  RT will assist as needed.

## 2018-02-16 NOTE — Care Management Important Message (Addendum)
Important Message  Patient Details IM Letter given to Nora/Case Manager to present to the Patient Name: Bland Cristo Ausburn MRN: [id number] Date of Birth: 10/01/1930   Medicare Important Message Given:  Yes    Kerin Salen 02/16/2018, 10:02 AMImportant Message  Patient Details  Name: Codylee Isaiahs Chancy MRN: [id number] Date of Birth: [birth year]   Medicare Important Message Given:  Yes    Kerin Salen 02/16/2018, 10:02 AM

## 2018-02-16 NOTE — Progress Notes (Signed)
CSW following for assistance with disposition- complicated case - see past notes/assessment. CSW spoke with pt's pastor Linna Hoff [phone]- provided contact information for pt's sister-in-law Anees Vanecek [phone]. Sister-in-law states that pt closest living relative is her husband Clebert Wenger, however he has dementia and she handles his affairs. Sister-in-law states, "I am happy to help with making decisions for Garcia and providing permission for things, but going forward I am not in a position to be responsible for his care or affairs. I am 83 years old myself and in [place]."  Sister-in-law states she received a call from Sheppard Evens [phone] (c), [phone] (h).who identified herself as pt's cousin, offering sister-in-law help with pt. Provided contact information for cousin to [place]. Spoke with cousin- she states she is pt's paternal first cousin, lives in [place], and other than pt's brother is closes blood relative. States, "We know nothing about Dallon and Shirley's affairs. We wish we could help more- I am happy to sign for things if needed but if we are talking about legal decisions and finances, we wouldn't be able to help with that." Cousin states she believes that pt's deceased wife has niece/nephew in [place] area and are attempting to verify this and locate them to see if they could be of assistance.  Staffed case with CSW AD- advised that guardianship for pt may need to be pursued- advised awaiting to hear back if family willing to pursue guardianship, otherwise may move forward with involving DSS/APS in order to identify individual/entity able to access pt's funds and legal matters. CSW AD also advised that as pt's treatment progresses, will need to identify the appropriate placement setting (e.i., SNF versus ALF memory care, etc.). The course of CSW action will depend on level of care needed as well. Will continue working toward disposition.   Sharren Bridge, MSW,  LCSW Clinical Social Work 02/16/2018 [phone]

## 2018-02-17 DIAGNOSIS — G4733 Obstructive sleep apnea (adult) (pediatric): Secondary | ICD-10-CM

## 2018-02-17 LAB — CBC
HCT: 31.6 % — ABNORMAL LOW (ref 39.0–52.0)
HEMOGLOBIN: 10.3 g/dL — AB (ref 13.0–17.0)
MCH: 29.3 pg (ref 26.0–34.0)
MCHC: 32.6 g/dL (ref 30.0–36.0)
MCV: 90 fL (ref 78.0–100.0)
Platelets: 199 10*3/uL (ref 150–400)
RBC: 3.51 MIL/uL — AB (ref 4.22–5.81)
RDW: 14.3 % (ref 11.5–15.5)
WBC: 10 10*3/uL (ref 4.0–10.5)

## 2018-02-17 LAB — ARTHROPOD IDENTIFICATION

## 2018-02-17 LAB — BASIC METABOLIC PANEL
Anion gap: 8 (ref 5–15)
BUN: 32 mg/dL — AB (ref 6–20)
CHLORIDE: 102 mmol/L (ref 101–111)
CO2: 27 mmol/L (ref 22–32)
Calcium: 8.2 mg/dL — ABNORMAL LOW (ref 8.9–10.3)
Creatinine, Ser: 0.8 mg/dL (ref 0.61–1.24)
GFR calc Af Amer: >60 mL/min (ref >60)
GFR calc non Af Amer: >60 mL/min (ref >60)
Glucose, Bld: 105 mg/dL — ABNORMAL HIGH (ref 65–99)
POTASSIUM: 4 mmol/L (ref 3.5–5.1)
SODIUM: 137 mmol/L (ref 135–145)

## 2018-02-17 MED ORDER — SODIUM CHLORIDE 0.9 % IV SOLN
INTRAVENOUS | Status: DC
  Administered 2018-02-17 – 2018-02-25 (×13): via INTRAVENOUS

## 2018-02-17 NOTE — Progress Notes (Signed)
PROGRESS NOTE  [name] [name] [id number]:[id number] DOB: [birth year] DOA: 02/13/2018 PCP: Leanna Battles, MD  HPI/Recap of past 24 hours: Mr. Trella is an 84 year old male with past medical history significant for dementia, hyperlipidemia, who was initially visiting his wife, his only caretaker.  She subsequently had a code blue and deceased on 03-04-18.  Prior to this event medical staff had noted patient was dyspneic and confused while visiting his wife.  He was subsequently admitted for altered mental status and hypoxia.  02/15/2018: Patient seen and examined at his bedside.  He is alert but confused in the setting of dementia.  Rash noted affecting the anterior right upper extremity as well as back lower extremities abdomen. Pruritis noted on examination with suspicion for scabies.  Permethrin ordered.  Unclear how long he has had this pruritus and rash or if anyone else in his household or living area affected by this as his wife and only caretaker deceased yesterday 03-04-2018.  Unable to obtain a review of systems due to confusion in the state of dementia.  02/17/18:  No new complaints.   Assessment/Plan: Principal Problem:   Hypoxia Active Problems:   Anxiety state   OBSTRUCTIVE SLEEP APNEA   GERD   Edema   Hypoxemia   Dementia   Altered mental status  Acute hypoxic respiratory failure of unclear etiology Chest x-ray unremarkable for lobular infiltrates Hypoxia has resolved Good O2 saturation on room air  Acute dehydration Encourage increase in po fluid intake Start IV fluid NS at 75cc/hr BUN to creatinine ratio >20  B/L LE cellulitis, improving C/w doxycycline  Acute metabolic encephalopathy in the setting of dementia, resolved Appears to be back at his baseline Caseworker working on obtaining more information regarding guardianship Landscape architect at bedside Reorient as needed Fall precaution CT head done on 2018-03-04 no intracranial abnormalities  Suspected  scabies Started permethrin cream Obtain scabies testing to confirm Contact precaution  B/l LE chronic stasis dermatitis/suspected b/l LE cellulitis c/w doxycycline 100 mg po bid  Hyperlipidemia Continue pravastatin  Normocytic anemia Hemoglobin 10.7 No sign of overt bleeding Unclear of his baseline Repeat CBC in the morning  Code Status: Full code suspected scabies  Family Communication: None at bedside  Disposition Plan: SNF; CSW attempting to contact family/relatives/friends for guardianship   Consultants:  CSW  Wound care specialist  Procedures:  None  Antimicrobials: doxycycline  DVT prophylaxis:  sq lovenox   Objective: Vitals:   02/16/18 1100 02/16/18 1453 02/16/18 2206 02/17/18 0531  BP: 129/73 139/87 113/88 105/61  Pulse:  67 96 69  Resp:  20 18 16   Temp:  98.4 F (36.9 C) 99.1 F (37.3 C) 99 F (37.2 C)  TempSrc:  Oral Oral Oral  SpO2:  100% 97% 96%  Weight:      Height:        Intake/Output Summary (Last 24 hours) at 02/17/2018 1422 Last data filed at 02/17/2018 1201 Gross per 24 hour  Intake 980 ml  Output -  Net 980 ml   Filed Weights   02/13/18 2030 04-Mar-2018 0009  Weight: 82.1 kg (181 lb) 75.8 kg (167 lb 1.7 oz)    Exam:  . General: 84 y.o. year-old male pleasantly demented. NAD WD WN. Cardiovascular: RRR no rubs or gallops. NO JVD or thyromegaly. Marland Kitchen Respiratory: Clear to auscultation with no wheezes or rales. Good inspiratory effort. . Abdomen: Soft nontender nondistended with normal bowel sounds x4 quadrants. . Musculoskeletal: No lower extremity edema. 2/4 pulses in all 4 extremities. Marland Kitchen  Skin: b/l lower extremity chronic stasis dermatitis. Rash, pruritus affecting anterior right upper extremity, lowe extremities, abdomen. LE erythema from ankle down. Marland Kitchen Psychiatry: Mood is appropriate for condition and setting   Data Reviewed: CBC: Recent Labs  Lab 02/13/18 2118 02/14/18 0806 02/17/18 0404  WBC 4.2 4.3 10.0  NEUTROABS  1.9  --   --   HGB 11.9* 10.4* 10.3*  HCT 36.9* 32.4* 31.6*  MCV 90.9 91.3 90.0  PLT 281 223 710   Basic Metabolic Panel: Recent Labs  Lab 02/13/18 2118 02/14/18 0806 02/17/18 0404  NA 142 140 137  K 4.1 4.2 4.0  CL 105 106 102  CO2 32 30 27  GLUCOSE 105* 100* 105*  BUN 21* 20 32*  CREATININE 1.01 0.82 0.80  CALCIUM 8.6* 7.9* 8.2*   GFR: Estimated Creatinine Clearance: 73.7 mL/min (by C-G formula based on SCr of 0.8 mg/dL). Liver Function Tests: Recent Labs  Lab 02/13/18 2118 02/14/18 0806  AST 27 20  ALT 20 16*  ALKPHOS 53 40  BILITOT 0.7 0.5  PROT 6.3* 5.0*  ALBUMIN 3.5 2.7*   No results for input(s): LIPASE, AMYLASE in the last 168 hours. No results for input(s): AMMONIA in the last 168 hours. Coagulation Profile: No results for input(s): INR, PROTIME in the last 168 hours. Cardiac Enzymes: No results for input(s): CKTOTAL, CKMB, CKMBINDEX, TROPONINI in the last 168 hours. BNP (last 3 results) No results for input(s): PROBNP in the last 8760 hours. HbA1C: No results for input(s): HGBA1C in the last 72 hours. CBG: Recent Labs  Lab 02/14/18 1211  GLUCAP 101*   Lipid Profile: No results for input(s): CHOL, HDL, LDLCALC, TRIG, CHOLHDL, LDLDIRECT in the last 72 hours. Thyroid Function Tests: No results for input(s): TSH, T4TOTAL, FREET4, T3FREE, THYROIDAB in the last 72 hours. Anemia Panel: No results for input(s): VITAMINB12, FOLATE, FERRITIN, TIBC, IRON, RETICCTPCT in the last 72 hours. Urine analysis:    Component Value Date/Time   COLORURINE YELLOW 02/14/2018 1021   APPEARANCEUR CLEAR 02/14/2018 1021   LABSPEC 1.010 02/14/2018 1021   PHURINE 7.0 02/14/2018 1021   GLUCOSEU NEGATIVE 02/14/2018 1021   HGBUR NEGATIVE 02/14/2018 1021   BILIRUBINUR NEGATIVE 02/14/2018 1021   KETONESUR NEGATIVE 02/14/2018 1021   PROTEINUR NEGATIVE 02/14/2018 1021   NITRITE NEGATIVE 02/14/2018 1021   LEUKOCYTESUR NEGATIVE 02/14/2018 1021   Sepsis  Labs: @LABRCNTIP (procalcitonin:4,lacticidven:4)  ) Recent Results (from the past 240 hour(s))  Culture, Urine     Status: None   Collection Time: 02/14/18 10:21 AM  Result Value Ref Range Status   Specimen Description   Final    URINE, RANDOM Performed at [facility], [place] [address] [place], [place] [address]    Special Requests   Final    NONE Performed at [facility], [place] [address], [place], [place] [address]    Culture   Final    NO GROWTH Performed at [facility], [place] [address] [place], [place] [address]    Report Status 02/15/2018 FINAL  Final  MRSA PCR Screening     Status: None   Collection Time: 02/16/18  9:45 AM  Result Value Ref Range Status   MRSA by PCR NEGATIVE NEGATIVE Final    Comment:        The GeneXpert MRSA Assay (FDA approved for NASAL specimens only), is one component of a comprehensive MRSA colonization surveillance program. It is not intended to diagnose MRSA infection nor to guide or monitor treatment for  MRSA infections. Performed at [facility], [place] [address] [place],  [address]       Studies: No results found.  Scheduled Meds: . doxycycline  100 mg Oral Q12H  . enoxaparin (LOVENOX) injection  40 mg Subcutaneous QHS  . pravastatin  10 mg Oral Daily    Continuous Infusions: . sodium chloride 75 mL/hr at 02/17/18 1146     LOS: 4 days     Kayleen Memos, MD [facility] Pager [phone]  If 7PM-7AM, please contact night-coverage [email or web address] Password [email or web address] 02/17/2018, 2:22 PM

## 2018-02-17 NOTE — Progress Notes (Addendum)
CSW following for social/disposition needs- complex case, see past notes.  As of this time CSW has been unable to identify any family willing to pursue responsibility for pt's affairs (see notes 02/17/18- spoke with both cousin Jackelyn Poling and sister-in-law Inez Catalina again today- have been unable to locate any other family members). Pt's paster Linna Hoff and his wife Enid Derry are only local contacts known and neither report having knowledge of a POA or if pt has attorney/trustee/etc. Staffed case again with CSW AD- advised to involve DSS in order to work toward ensuring pt's protection and safety moving forward, for assistance investigating pt's assets and identifying appropriate individual or entity to be responsible for pt's care decisions moving forward.  Left voicemail with [place] DSS- will proceed with report once call returned.  Sharren Bridge, MSW, LCSW Clinical Social Work 02/17/2018 [phone]  16:00- visitors arrived on unit stating that they are pt's deceased wife's nephews Chong Sicilian and Azael Ragain [phone]. State they reside in [place] and just received call from distant family last night informing them of pt's wife demise and of pt's situation in hospital. State to their knowledge they are closest living relatives other than pt's brother Rolfe Hartsell, mentioned in previous notes). Nephews state they are going to enlist attorney's assistance in gathering information about pt and his deceased wife's affairs so that the appropriate party can be responsible.  Stated they will update CSW on findings.

## 2018-02-17 NOTE — Progress Notes (Signed)
Physical Therapy Treatment Patient Details Name: [name] [name] MRN: [id number] DOB: July 15, 1931 Today's Date: 02/17/2018    History of Present Illness 84 year old with past medical history relevant for dementia, hyperlipidemia, possible mood disorder who was initially visiting his wife who was hospitalized for diarrhea.  She subsequently had a code event and was transferred to the ICU.  Patient was noted to be altered and quite confused and initially hypoxic.  Patient was admitted for altered mental status and hypoxia.    PT Comments    Pt in bed room dark sleeping.  Easily aroused.  Pleasant.  Confused to current situation.  Knew he was at [facility] other than not much else.  Following repeated one step commands. Assisted EOB pt able to don own shoes.  Assisted to bathroom.  Pt able to don/doff pants and maintain a safe balance.  Pt able to rise and lower safely.  Assisted out of bathroom to amb in hallway.  General Gait Details: able to amb well with no need for any AD and alternating gait.  Impaired safety cognition and envirnmental awareness makes him a FALL RISK.   Pt would benefit from ALF setting if possible.    Follow Up Recommendations  Supervision/Assistance - 24 hour     Equipment Recommendations  None recommended by PT    Recommendations for Other Services       Precautions / Restrictions Precautions Precautions: Fall Precaution Comments: Hx dementia Restrictions Weight Bearing Restrictions: No    Mobility  Bed Mobility               General bed mobility comments: Mod I with increased time and repeat functional VC's to complete task   Transfers Overall transfer level: Needs assistance Equipment used: None Transfers: Sit to/from [employer] Transfers Sit to Stand: Supervision;Min guard Stand pivot transfers: Supervision;Min guard       General transfer comment: 50% VC's for task completion and direction  Ambulation/Gait Ambulation/Gait  assistance: Supervision;Min guard Gait Distance (Feet): 285 Feet Assistive device: None Gait Pattern/deviations: Step-through pattern;Decreased stride length;Drifts right/left;Trunk flexed;Shuffle Gait velocity: WFL   General Gait Details: able to amb well with no need for any AD and alternating gait.  Impaired safety cognition and envirnmental awareness makes him a FALL RISK.     Stairs             Wheelchair Mobility    Modified Rankin (Stroke Patients Only)       Balance                                            Cognition Arousal/Alertness: Awake/alert     Area of Impairment: Orientation;Attention;Memory;Safety/judgement;Awareness;Problem solving                 Orientation Level: Place;Time;Situation       Safety/Judgement: Decreased awareness of safety;Decreased awareness of deficits     General Comments: talkative with rambling random conversation occassional switch to current but quickly back to random      following repeated one step commands      Exercises      General Comments        Pertinent Vitals/Pain Pain Assessment: No/denies pain    Home Living                      Prior Function  PT Goals (current goals can now be found in the care plan section)      Frequency    Min 3X/week      PT Plan Current plan remains appropriate    Co-evaluation              AM-PAC PT "6 Clicks" Daily Activity  Outcome Measure  Difficulty turning over in bed (including adjusting bedclothes, sheets and blankets)?: None Difficulty moving from lying on back to sitting on the side of the bed? : A Little Difficulty sitting down on and standing up from a chair with arms (e.g., wheelchair, bedside commode, etc,.)?: A Little Help needed moving to and from a bed to chair (including a wheelchair)?: A Little Help needed walking in hospital room?: A Little Help needed climbing 3-5 steps with a railing? :  A Little 6 Click Score: 19    End of Session Equipment Utilized During Treatment: Gait belt Activity Tolerance: Patient tolerated treatment well Patient left: in bed;with call bell/phone within reach;with bed alarm set   PT Visit Diagnosis: Other abnormalities of gait and mobility (R26.89)     Time   15:04 - 15:22 PT Time Calculation (min) (ACUTE ONLY): 18 min  Charges:  $Gait Training: 8-22 mins                    G Codes:       Rica Koyanagi  PTA [facility]  Acute  Rehab Pager      [phone]

## 2018-02-18 DIAGNOSIS — Z634 Disappearance and death of family member: Secondary | ICD-10-CM

## 2018-02-18 DIAGNOSIS — J9601 Acute respiratory failure with hypoxia: Principal | ICD-10-CM

## 2018-02-18 DIAGNOSIS — M542 Cervicalgia: Secondary | ICD-10-CM

## 2018-02-18 DIAGNOSIS — B86 Scabies: Secondary | ICD-10-CM

## 2018-02-18 DIAGNOSIS — F0390 Unspecified dementia without behavioral disturbance: Secondary | ICD-10-CM

## 2018-02-18 DIAGNOSIS — Z008 Encounter for other general examination: Secondary | ICD-10-CM

## 2018-02-18 MED ORDER — ACETAMINOPHEN 325 MG PO TABS
650.0000 mg | ORAL_TABLET | Freq: Four times a day (QID) | ORAL | Status: DC | PRN
  Administered 2018-02-18 – 2018-02-24 (×2): 650 mg via ORAL
  Filled 2018-02-18 (×2): qty 2

## 2018-02-18 NOTE — Progress Notes (Signed)
PROGRESS NOTE  Alen Matheson VPX:[id number] DOB: 1931-04-26 DOA: 02/13/2018 PCP: Leanna Battles, MD  HPI/Recap of past 24 hours: Mr. Blue is an 85 year old male with past medical history significant for dementia, hyperlipidemia, who was initially visiting his wife, his only caretaker.  She subsequently had a code blue and deceased on Feb 17, 2018.  Prior to this event medical staff had noted patient was dyspneic and confused while visiting his wife.  He was subsequently admitted for altered mental status and hypoxia.  02/15/2018: Patient seen and examined at his bedside.  He is alert but confused in the setting of dementia.  Rash noted affecting the anterior right upper extremity as well as back lower extremities abdomen. Pruritis noted on examination with suspicion for scabies.  Permethrin ordered.  Unclear how long he has had this pruritus and rash or if anyone else in his household or living area affected by this as his wife and only caretaker deceased yesterday 2018-02-17.  Unable to obtain a review of systems due to confusion in the state of dementia.  02/18/2018: No acute events overnight.  Patient has no complaint on this day.  Awaiting bed placement at SNF/ALF.  Psychiatry consulted to assess for competence.  Highly appreciated.   Assessment/Plan: Principal Problem:   Hypoxia Active Problems:   Anxiety state   OBSTRUCTIVE SLEEP APNEA   GERD   Edema   Hypoxemia   Dementia   Altered mental status  Acute hypoxic respiratory failure of unclear etiology Chest x-ray unremarkable for lobular infiltrates Hypoxia has resolved Good O2 saturation on room air  Acute dehydration, resolving Encourage increase in po fluid intake Continue IV fluid NS at 75cc/hr BUN to creatinine ratio >20  B/L LE cellulitis, improving C/w doxycycline  Acute metabolic encephalopathy in the setting of dementia, improving Appears to be back at his baseline Caseworker working on obtaining more  information regarding guardianship Landscape architect at bedside Reorient as needed Fall precaution CT head done on Feb 17, 2018 no intracranial abnormalities  Suspected scabies Given permethrin cream on 02/15/2018 scabies testing to confirm Contact precaution  B/l LE chronic stasis dermatitis/suspected b/l LE cellulitis c/w doxycycline 100 mg po bid  Hyperlipidemia Continue pravastatin  Normocytic anemia Hemoglobin 10.7 No sign of overt bleeding Unclear of his baseline Repeat CBC in the morning  Code Status: Full code suspected scabies  Family Communication: None at bedside  Disposition Plan: SNF; CSW attempting to contact family/relatives/friends for guardianship   Consultants:  CSW  Wound care specialist  Procedures:  None  Antimicrobials: doxycycline  DVT prophylaxis:  sq lovenox   Objective: Vitals:   02/17/18 1537 02/17/18 2108 02/18/18 0644 02/18/18 1203  BP: 104/65 129/79 (!) 106/55 120/76  Pulse: 60 63 60 62  Resp: 16 16 18 18   Temp: 98.8 F (37.1 C) 98.7 F (37.1 C) 98.9 F (37.2 C) 97.7 F (36.5 C)  TempSrc: Oral Oral Oral Oral  SpO2: 96% 99% 96% 100%  Weight:      Height:        Intake/Output Summary (Last 24 hours) at 02/18/2018 1414 Last data filed at 02/18/2018 1204 Gross per 24 hour  Intake 2117.5 ml  Output -  Net 2117.5 ml   Filed Weights   02/13/18 2030 02-17-18 0009  Weight: 82.1 kg (181 lb) 75.8 kg (167 lb 1.7 oz)    Exam:  . General: 84 y.o. year-old male WD WN NAD Alert and pleasantly demented.  Marland Kitchen Respiratory: Clear to auscultation with no wheezes or rales.  Good inspiratory  effort.   . Abdomen: Soft nontender nondistended with normal bowel sounds x4 quadrants. . Musculoskeletal: No lower extremity edema. 2/4 pulses in all 4 extremities. . Skin: b/l lower extremity chronic stasis dermatitis, improving. Rash, pruritus affecting anterior right upper extremity, lowe extremities, abdomen. LE erythema from ankle  down. Marland Kitchen Psychiatry: Mood is appropriate for condition and setting   Data Reviewed: CBC: Recent Labs  Lab 02/13/18 2118 02/14/18 0806 02/17/18 0404  WBC 4.2 4.3 10.0  NEUTROABS 1.9  --   --   HGB 11.9* 10.4* 10.3*  HCT 36.9* 32.4* 31.6*  MCV 90.9 91.3 90.0  PLT 281 223 595   Basic Metabolic Panel: Recent Labs  Lab 02/13/18 2118 02/14/18 0806 02/17/18 0404  NA 142 140 137  K 4.1 4.2 4.0  CL 105 106 102  CO2 32 30 27  GLUCOSE 105* 100* 105*  BUN 21* 20 32*  CREATININE 1.01 0.82 0.80  CALCIUM 8.6* 7.9* 8.2*   GFR: Estimated Creatinine Clearance: 73.7 mL/min (by C-G formula based on SCr of 0.8 mg/dL). Liver Function Tests: Recent Labs  Lab 02/13/18 2118 02/14/18 0806  AST 27 20  ALT 20 16*  ALKPHOS 53 40  BILITOT 0.7 0.5  PROT 6.3* 5.0*  ALBUMIN 3.5 2.7*   No results for input(s): LIPASE, AMYLASE in the last 168 hours. No results for input(s): AMMONIA in the last 168 hours. Coagulation Profile: No results for input(s): INR, PROTIME in the last 168 hours. Cardiac Enzymes: No results for input(s): CKTOTAL, CKMB, CKMBINDEX, TROPONINI in the last 168 hours. BNP (last 3 results) No results for input(s): PROBNP in the last 8760 hours. HbA1C: No results for input(s): HGBA1C in the last 72 hours. CBG: Recent Labs  Lab 02/14/18 1211  GLUCAP 101*   Lipid Profile: No results for input(s): CHOL, HDL, LDLCALC, TRIG, CHOLHDL, LDLDIRECT in the last 72 hours. Thyroid Function Tests: No results for input(s): TSH, T4TOTAL, FREET4, T3FREE, THYROIDAB in the last 72 hours. Anemia Panel: No results for input(s): VITAMINB12, FOLATE, FERRITIN, TIBC, IRON, RETICCTPCT in the last 72 hours. Urine analysis:    Component Value Date/Time   COLORURINE YELLOW 02/14/2018 1021   APPEARANCEUR CLEAR 02/14/2018 1021   LABSPEC 1.010 02/14/2018 1021   PHURINE 7.0 02/14/2018 1021   GLUCOSEU NEGATIVE 02/14/2018 1021   HGBUR NEGATIVE 02/14/2018 1021   BILIRUBINUR NEGATIVE 02/14/2018  1021   [place] 02/14/2018 1021   PROTEINUR NEGATIVE 02/14/2018 1021   NITRITE NEGATIVE 02/14/2018 1021   LEUKOCYTESUR NEGATIVE 02/14/2018 1021   Sepsis Labs: @LABRCNTIP (procalcitonin:4,lacticidven:4)  ) Recent Results (from the past 240 hour(s))  Culture, Urine     Status: None   Collection Time: 02/14/18 10:21 AM  Result Value Ref Range Status   Specimen Description   Final    URINE, RANDOM Performed at [facility], [place] [address] [place], [place] [address]    Special Requests   Final    NONE Performed at [facility], [place] [address], [place], [place] [address]    Culture   Final    NO GROWTH Performed at [facility], [place] [address], [place], [place] [address]    Report Status 02/15/2018 FINAL  Final  Arthropod identification     Status: None   Collection Time: 02/15/18 12:45 PM  Result Value Ref Range Status   Parasite ID, Arthropod Comment  Final    Comment: (NOTE) Nonparasitic material received. Performed At: [facility] [address] [place], [place] [id number] Perlie Gold  Derinda Late MD JI:[id number] Performed at [facility], [place] [address] [place], [place] [address]   MRSA PCR Screening     Status: None   Collection Time: 02/16/18  9:45 AM  Result Value Ref Range Status   MRSA by PCR NEGATIVE NEGATIVE Final    Comment:        The GeneXpert MRSA Assay (FDA approved for NASAL specimens only), is one component of a comprehensive MRSA colonization surveillance program. It is not intended to diagnose MRSA infection nor to guide or monitor treatment for MRSA infections. Performed at [facility], [place] [address], [place], [place] [address]       Studies: No results found.  Scheduled Meds: . doxycycline  100 mg Oral Q12H  . enoxaparin (LOVENOX) injection  40 mg Subcutaneous QHS  . pravastatin  10 mg Oral Daily    Continuous Infusions: . sodium chloride  75 mL/hr at 02/18/18 0141     LOS: 5 days     Kayleen Memos, MD [facility] Pager [phone]  If 7PM-7AM, please contact night-coverage [email or web address] Password [email or web address] 02/18/2018, 2:14 PM

## 2018-02-18 NOTE — Consult Note (Addendum)
[facility] Consult   Reason for Consult:  Capacity evaluation  Referring Physician:  Dr. Nevada Crane Patient Identification: [name] [name] MRN:  [id number] Principal Diagnosis: Evaluation by psychiatric service required Diagnosis:   Patient Active Problem List   Diagnosis Date Noted  . Hypoxia [R09.02] 02/13/2018  . Edema [R60.9] 02/13/2018  . Hypoxemia [R09.02] 02/13/2018  . Dementia [F03.90] 02/13/2018  . Altered mental status [R41.82] 02/13/2018  . Dermatitis [L30.9] 10/08/2015  . CHEST PAIN, ATYPICAL [R07.89] 04/11/2010  . Personal history of colon polyps and family history of colon cancer [Z86.010] 12/19/2007  . Anxiety state [F41.1] 12/19/2007  . DEPRESSION [F32.9] 12/19/2007  . OBSTRUCTIVE SLEEP APNEA [G47.33] 12/19/2007  . ALLERGIC CONJUNCTIVITIS [H10.45] 12/19/2007  . Seasonal allergic rhinitis [J30.2] 12/19/2007  . GERD [K21.9] 12/19/2007  . IBS [K58.9] 12/19/2007    Total Time spent with patient: 1 hour  Subjective:   [name] [name] is a 84 y.o. male patient admitted with acute hypoxic respiratory failure of unclear etiology.  HPI:   Per chart review, patient was initially visiting his wife at the hospital. She coded and passed away on 02/20/23. He was noted to be dyspneic and confused while visiting her. He is now admitted for AMS and hypoxia. He is receiving treatment for bilateral lower extremity cellulitis. He has a history of dementia. He was evaluated by PT and is recommended for 24 hour supervision due to impaired safety cognition and environmental awareness which makes him a fall risk. Psychiatry was consulted for capacity evaluation to refuse SNF placement. Home medications include Depakote 125 mg TID, Atarax 25 mg qhs and Donepezil 5 mg qhs.   On interview, [name] [name] is only oriented to person. He believes the month is January and the year is 19. He is unable to recall his medical conditions or medications that he takes. He denies SI,  HI or AVH. He denies problems with sleep or appetite although per nursing he did not sleep well last night.   Past Psychiatric History: Anxiety and dementia.   Risk to Self:  None. Denies SI.  Risk to Others:  None. Denies HI. Prior Inpatient Therapy:  None Prior Outpatient Therapy:  Unknown   Past Medical History:  Past Medical History:  Diagnosis Date  . Allergic rhinitis, cause unspecified   . Anxiety state, unspecified   . Arthritis   . Benign neoplasm of colon   . Depressive disorder, not elsewhere classified   . Esophageal reflux   . Hyperlipidemia   . Irritable bowel syndrome   . Obstructive sleep apnea (adult) (pediatric)   . Other chronic allergic conjunctivitis   . Ulcer 1951   stomach    Past Surgical History:  Procedure Laterality Date  . APPENDECTOMY    . CATARACT EXTRACTION W/ INTRAOCULAR LENS  IMPLANT, BILATERAL  2003  . COLONOSCOPY    . ESOPHAGOGASTRODUODENOSCOPY    . TONSILLECTOMY     Family History:  Family History  Problem Relation Age of Onset  . Colon cancer Father   . Stroke Mother   . Heart attack Mother   . Colon cancer Mother   . Rectal cancer Neg Hx   . Stomach cancer Neg Hx    Family Psychiatric  History: Unknown  Social History:  Social History   Substance and Sexual Activity  Alcohol Use No     Social History   Substance and Sexual Activity  Drug Use No    Social History   Socioeconomic History  . Marital status:  Married    Spouse name: Not on file  . Number of children: Not on file  . Years of education: Not on file  . Highest education level: Not on file  Occupational History  . Not on file  Social Needs  . Financial resource strain: Not on file  . Food insecurity:    Worry: Not on file    Inability: Not on file  . Transportation needs:    Medical: Not on file    Non-medical: Not on file  Tobacco Use  . Smoking status: Never Smoker  . Smokeless tobacco: Never Used  Substance and Sexual Activity  . Alcohol use:  No  . Drug use: No  . Sexual activity: Not on file  Lifestyle  . Physical activity:    Days per week: Not on file    Minutes per session: Not on file  . Stress: Not on file  Relationships  . Social connections:    Talks on phone: Not on file    Gets together: Not on file    Attends religious service: Not on file    Active member of club or organization: Not on file    Attends meetings of clubs or organizations: Not on file    Relationship status: Not on file  Other Topics Concern  . Not on file  Social History Narrative  . Not on file   Additional Social History: He is a widower. He does not have children. He denies illicit substance or alcohol use.     Allergies:   Allergies  Allergen Reactions  . Codeine   . Zantac [Ranitidine]   . Amoxicillin Rash  . Penicillins Rash    Labs:  Results for orders placed or performed during the hospital encounter of 02/13/18 (from the past 48 hour(s))  CBC     Status: Abnormal   Collection Time: 02/17/18  4:04 AM  Result Value Ref Range   WBC 10.0 4.0 - 10.5 K/uL   RBC 3.51 (L) 4.22 - 5.81 MIL/uL   Hemoglobin 10.3 (L) 13.0 - 17.0 g/dL   HCT 31.6 (L) 39.0 - 52.0 %   MCV 90.0 78.0 - 100.0 fL   MCH 29.3 26.0 - 34.0 pg   MCHC 32.6 30.0 - 36.0 g/dL   RDW 14.3 11.5 - 15.5 %   Platelets 199 150 - 400 K/uL    Comment: Performed at [facility], [place] [address] [place], [place] [address]  Basic metabolic panel     Status: Abnormal   Collection Time: 02/17/18  4:04 AM  Result Value Ref Range   Sodium 137 135 - 145 mmol/L   Potassium 4.0 3.5 - 5.1 mmol/L   Chloride 102 101 - 111 mmol/L   CO2 27 22 - 32 mmol/L   Glucose, Bld 105 (H) 65 - 99 mg/dL   BUN 32 (H) 6 - 20 mg/dL   Creatinine, Ser 0.80 0.61 - 1.24 mg/dL   Calcium 8.2 (L) 8.9 - 10.3 mg/dL   GFR calc non Af Amer >60 >60 mL/min   GFR calc Af Amer >60 >60 mL/min    Comment: (NOTE) The eGFR has been calculated using the CKD EPI equation. This calculation has  not been validated in all clinical situations. eGFR's persistently <60 mL/min signify possible Chronic Kidney Disease.    Anion gap 8 5 - 15    Comment: Performed at [facility], [place] [address] [place], [place] [address]    Current Facility-Administered Medications  Medication Dose Route Frequency Provider Last Rate Last Dose  . 0.9 %  sodium chloride infusion   Intravenous Continuous Kayleen Memos, DO 75 mL/hr at 02/18/18 0141    . acetaminophen (TYLENOL) tablet 650 mg  650 mg Oral Q6H PRN Bodenheimer, Charles A, NP   650 mg at 02/18/18 0200  . diphenhydrAMINE (BENADRYL) 12.5 MG/5ML elixir 12.5 mg  12.5 mg Oral Q8H PRN Gardiner Barefoot, NP   12.5 mg at 02/18/18 1013  . doxycycline (VIBRA-TABS) tablet 100 mg  100 mg Oral Q12H Hall, Carole N, DO   100 mg at 02/18/18 1014  . enoxaparin (LOVENOX) injection 40 mg  40 mg Subcutaneous QHS Elwyn Reach, MD   40 mg at 02/17/18 2119  . pravastatin (PRAVACHOL) tablet 10 mg  10 mg Oral Daily Elwyn Reach, MD   10 mg at 02/18/18 1014    Musculoskeletal: Strength & Muscle Tone: within normal limits Gait & Station: normal Patient leans: N/A  Psychiatric Specialty Exam: Physical Exam  Nursing note and vitals reviewed. Constitutional: He appears well-developed.  HENT:  Head: Normocephalic and atraumatic.  Neck: Normal range of motion.  Respiratory: Effort normal.  Musculoskeletal: Normal range of motion.  Neurological: He is alert.  Only oriented to person.  Skin: Rash (scabies) noted.  Psychiatric: He has a normal mood and affect. His speech is normal and behavior is normal. Judgment and thought content normal. Cognition and memory are impaired.    Review of Systems  Constitutional: Negative for chills and fever.  Cardiovascular: Negative for chest pain.  Gastrointestinal: Negative for abdominal pain, constipation, diarrhea, nausea and vomiting.  Musculoskeletal: Positive for neck pain.   Psychiatric/Behavioral: Negative for depression, hallucinations, substance abuse and suicidal ideas. The patient does not have insomnia.   All other systems reviewed and are negative.   Blood pressure (!) 106/55, pulse 60, temperature 98.9 F (37.2 C), temperature source Oral, resp. rate 18, height 6' 2.5" (1.892 m), weight 75.8 kg (167 lb 1.7 oz), SpO2 96 %.Body mass index is 21.17 kg/m.  General Appearance: Fairly Groomed, elderly, Caucasian male with thick gray hair who is wearing paper hospital scrubs and lying in bed. NAD.  Eye Contact:  Good  Speech:  Clear and Coherent and Normal Rate  Volume:  Normal  Mood:  Euthymic  Affect:  Appropriate and Congruent  Thought Process:  Linear and Descriptions of Associations: Intact  Orientation:  Other:  Only oriented to person.  Thought Content:  Logical  Suicidal Thoughts:  No  Homicidal Thoughts:  No  Memory:  Immediate;   Poor Recent;   Poor Remote;   Poor  Judgement:  Impaired  Insight:  Lacking  Psychomotor Activity:  Normal  Concentration:  Concentration: Good and Attention Span: Good  Recall:  Poor  Fund of Knowledge:  Poor  Language:  Fair  Akathisia:  No  Handed:  Right  AIMS (if indicated):   N/A  Assets:  Armed forces logistics/support/administrative officer Social Support  ADL's:  Intact  Cognition: Impaired due to dementia.  Sleep:   Fair    Assessment:  [name] [name] is a 84 y.o. male who was admitted with acute hypoxic respiratory failure of unknown etiology. He lacks capacity to refuse or consent to SNF placement. He lacks insight of his current medical condition and his ability to care for self in the setting of cognitive impairment secondary to dementia. He will need an authorized representative to help him make these decisions.   Treatment Plan Summary: -  Patient lacks capacity to refuse or consent to SNF placement.  -Psychiatry will sign off on patient at this time. Please consult psychiatry again as needed.   Disposition: No  evidence of imminent risk to self or others at present.   Patient does not meet criteria for psychiatric inpatient admission.  Faythe Dingwall, DO 02/18/2018 11:34 AM

## 2018-02-18 NOTE — Progress Notes (Signed)
Pt's nephews produced POA paperwork naming "[name] Howell" and "Gerrit Friends" pt's designated representatives should pt's wife (now deceased) be unable to perform in this capacity. Nephews state pt's legal advisors have contacted POAs- CSW will work to contact as well (nephews report will call back with legal/POA contact information).  Sharren Bridge, MSW, LCSW Clinical Social Work 02/18/2018 [phone]

## 2018-02-19 MED ORDER — LORAZEPAM 0.5 MG PO TABS
0.5000 mg | ORAL_TABLET | Freq: Once | ORAL | Status: AC
  Administered 2018-02-19: 0.5 mg via ORAL
  Filled 2018-02-19: qty 1

## 2018-02-19 NOTE — Progress Notes (Signed)
PROGRESS NOTE  [name] [name] JJH:[id number] DOB: 1931-05-27 DOA: 02/13/2018 PCP: Leanna Battles, MD  HPI/Recap of past 24 hours: [name] [name] is an 84 year old male with past medical history significant for dementia, hyperlipidemia, who was initially visiting his wife, his only caretaker.  She subsequently had a code blue and deceased on March 13, 2018.  Prior to this event medical staff had noted patient was dyspneic and confused while visiting his wife.  He was subsequently admitted for altered mental status and hypoxia.  02/15/2018: Patient seen and examined at his bedside.  He is alert but confused in the setting of dementia.  Rash noted affecting the anterior right upper extremity as well as back lower extremities abdomen. Pruritis noted on examination with suspicion for scabies.  Permethrin ordered.  Unclear how long he has had this pruritus and rash or if anyone else in his household or living area affected by this as his wife and only caretaker deceased yesterday 03/13/18.  Unable to obtain a review of systems due to confusion in the state of dementia.  02/18/2018: No acute events overnight.  Patient has no complaint on this day.  Awaiting bed placement at SNF/ALF.  Psychiatry consulted to assess for competence.  Highly appreciated.  02/19/18: No new complaints. Pleasantly demented. Awaiting placement.   Assessment/Plan: Principal Problem:   Evaluation by psychiatric service required Active Problems:   Anxiety state   OBSTRUCTIVE SLEEP APNEA   GERD   Hypoxia   Edema   Hypoxemia   Dementia   Altered mental status  Acute hypoxic respiratory failure of unclear etiology Chest x-ray unremarkable for lobular infiltrates Hypoxia has resolved Good O2 saturation on room air  Acute dehydration, resolving Encourage increase in po fluid intake Continue IV fluid NS at 75cc/hr BUN to creatinine ratio >20  B/L LE cellulitis, improving C/w doxycycline  Acute metabolic encephalopathy  in the setting of dementia, improving Appears to be back at his baseline Caseworker working on obtaining more information regarding guardianship Landscape architect at bedside Reorient as needed Fall precaution CT head done on 03/13/2018 no intracranial abnormalities  Suspected scabies Given permethrin cream on 02/15/2018 scabies testing to confirm Contact precaution  B/l LE chronic stasis dermatitis/suspected b/l LE cellulitis c/w doxycycline 100 mg po bid  Hyperlipidemia Continue pravastatin  Normocytic anemia Hemoglobin 10.7 No sign of overt bleeding Unclear of his baseline Repeat CBC in the morning  Code Status: Full code suspected scabies  Family Communication: None at bedside  Disposition Plan: SNF; CSW attempting to contact family/relatives/friends for guardianship   Consultants:  CSW  Wound care specialist  Procedures:  None  Antimicrobials: doxycycline  DVT prophylaxis:  sq lovenox   Objective: Vitals:   02/18/18 0644 02/18/18 1203 02/18/18 1813 02/19/18 1136  BP: (!) 106/55 120/76 120/72 130/69  Pulse: 60 62 63 64  Resp: 18 18 18 18   Temp: 98.9 F (37.2 C) 97.7 F (36.5 C) 98.5 F (36.9 C) 97.6 F (36.4 C)  TempSrc: Oral Oral Oral Oral  SpO2: 96% 100% 100% 100%  Weight:      Height:        Intake/Output Summary (Last 24 hours) at 02/19/2018 1612 Last data filed at 02/19/2018 1137 Gross per 24 hour  Intake 2220 ml  Output -  Net 2220 ml   Filed Weights   02/13/18 2030 03-13-2018 0009  Weight: 82.1 kg (181 lb) 75.8 kg (167 lb 1.7 oz)    Exam:  . General: 84 y.o. year-old maleWD wN NAD alert in the  setting [of dementia. Marland Kitchen Respiratory: CTA no wheezes or rales . Cardiovascular: RRR no rubs or gallops. No JVD or thyromegaly  . Abdomen: Soft nontender nondistended with normal bowel sounds x4 quadrants. . Musculoskeletal: No lower extremity edema. 2/4 pulses in all 4 extremities. . Skin: b/l lower extremity chronic stasis dermatitis,  improving. Rash, pruritus affecting anterior right upper extremity, lowe extremities, abdomen. LE erythema from ankle down. Marland Kitchen Psychiatry: Mood is appropriate for condition and setting   Data Reviewed: CBC: Recent Labs  Lab 02/13/18 2118 02/14/18 0806 02/17/18 0404  WBC 4.2 4.3 10.0  NEUTROABS 1.9  --   --   HGB 11.9* 10.4* 10.3*  HCT 36.9* 32.4* 31.6*  MCV 90.9 91.3 90.0  PLT 281 223 176   Basic Metabolic Panel: Recent Labs  Lab 02/13/18 2118 02/14/18 0806 02/17/18 0404  NA 142 140 137  K 4.1 4.2 4.0  CL 105 106 102  CO2 32 30 27  GLUCOSE 105* 100* 105*  BUN 21* 20 32*  CREATININE 1.01 0.82 0.80  CALCIUM 8.6* 7.9* 8.2*   GFR: Estimated Creatinine Clearance: 73.7 mL/min (by C-G formula based on SCr of 0.8 mg/dL). Liver Function Tests: Recent Labs  Lab 02/13/18 2118 02/14/18 0806  AST 27 20  ALT 20 16*  ALKPHOS 53 40  BILITOT 0.7 0.5  PROT 6.3* 5.0*  ALBUMIN 3.5 2.7*   No results for input(s): LIPASE, AMYLASE in the last 168 hours. No results for input(s): AMMONIA in the last 168 hours. Coagulation Profile: No results for input(s): INR, PROTIME in the last 168 hours. Cardiac Enzymes: No results for input(s): CKTOTAL, CKMB, CKMBINDEX, TROPONINI in the last 168 hours. BNP (last 3 results) No results for input(s): PROBNP in the last 8760 hours. HbA1C: No results for input(s): HGBA1C in the last 72 hours. CBG: Recent Labs  Lab 02/14/18 1211  GLUCAP 101*   Lipid Profile: No results for input(s): CHOL, HDL, LDLCALC, TRIG, CHOLHDL, LDLDIRECT in the last 72 hours. Thyroid Function Tests: No results for input(s): TSH, T4TOTAL, FREET4, T3FREE, THYROIDAB in the last 72 hours. Anemia Panel: No results for input(s): VITAMINB12, FOLATE, FERRITIN, TIBC, IRON, RETICCTPCT in the last 72 hours. Urine analysis:    Component Value Date/Time   COLORURINE YELLOW 02/14/2018 1021   APPEARANCEUR CLEAR 02/14/2018 1021   LABSPEC 1.010 02/14/2018 1021   PHURINE 7.0  02/14/2018 1021   GLUCOSEU NEGATIVE 02/14/2018 1021   HGBUR NEGATIVE 02/14/2018 1021   BILIRUBINUR NEGATIVE 02/14/2018 1021   [place] 02/14/2018 1021   PROTEINUR NEGATIVE 02/14/2018 1021   NITRITE NEGATIVE 02/14/2018 1021   LEUKOCYTESUR NEGATIVE 02/14/2018 1021   Sepsis Labs: @LABRCNTIP (procalcitonin:4,lacticidven:4)  ) Recent Results (from the past 240 hour(s))  Culture, Urine     Status: None   Collection Time: 02/14/18 10:21 AM  Result Value Ref Range Status   Specimen Description   Final    URINE, RANDOM Performed at [facility], [place] [address] [place], [place] [address]    Special Requests   Final    NONE Performed at [facility], [place] [address] [place], [place] [address]    Culture   Final    NO GROWTH Performed at [facility], [place] [address], [place], [place] [address]    Report Status 02/15/2018 FINAL  Final  Arthropod identification     Status: None   Collection Time: 02/15/18 12:45 PM  Result Value Ref Range Status   Parasite ID, Arthropod Comment  Final  Comment: (NOTE) Nonparasitic material received. Performed At: [facility] [place], [place] [id number] Rush Farmer MD [id number] Performed at [facility], [place] [address] [place], [place] [address]   MRSA PCR Screening     Status: None   Collection Time: 02/16/18  9:45 AM  Result Value Ref Range Status   MRSA by PCR NEGATIVE NEGATIVE Final    Comment:        The GeneXpert MRSA Assay (FDA approved for NASAL specimens only), is one component of a comprehensive MRSA colonization surveillance program. It is not intended to diagnose MRSA infection nor to guide or monitor treatment for MRSA infections. Performed at [facility], [place] [address] [place], [place] [address]       Studies: No results found.  Scheduled Meds: . doxycycline  100 mg Oral Q12H  . enoxaparin  (LOVENOX) injection  40 mg Subcutaneous QHS  . pravastatin  10 mg Oral Daily    Continuous Infusions: . sodium chloride 75 mL/hr at 02/18/18 1500     LOS: 6 days     Kayleen Memos, MD [facility] Pager [phone]  If 7PM-7AM, please contact night-coverage [email or web address] Password [facility] 02/19/2018, 4:12 PM

## 2018-02-20 LAB — CREATININE, SERUM
Creatinine, Ser: 0.73 mg/dL (ref 0.61–1.24)
GFR calc Af Amer: >60 mL/min (ref >60)
GFR calc non Af Amer: >60 mL/min (ref >60)

## 2018-02-20 NOTE — Progress Notes (Signed)
PROGRESS NOTE  Asir Bingley CHE:[id number] DOB: 07-Jan-1931 DOA: 02/13/2018 PCP: Leanna Battles, MD  HPI/Recap of past 24 hours: Mr. Velardi is an 84 year old male with past medical history significant for dementia, hyperlipidemia, who was initially visiting his wife, his only caretaker.  She subsequently had a code blue and deceased on 02/25/18.  Prior to this event medical staff had noted patient was dyspneic and confused while visiting his wife.  He was subsequently admitted for altered mental status and hypoxia.  02/15/2018: Patient seen and examined at his bedside.  He is alert but confused in the setting of dementia.  Rash noted affecting the anterior right upper extremity as well as back lower extremities abdomen. Pruritis noted on examination with suspicion for scabies.  Permethrin ordered.  Unclear how long he has had this pruritus and rash or if anyone else in his household or living area affected by this as his wife and only caretaker deceased yesterday Feb 25, 2018.  Unable to obtain a review of systems due to confusion in the state of dementia.  02/18/2018: No acute events overnight.  Patient has no complaint on this day.  Awaiting bed placement at SNF/ALF.  Psychiatry consulted to assess for competence.  Highly appreciated.  02/19/18: No new complaints. Pleasantly demented. Awaiting placement.  02/20/18: Patient seen and examined with his sitter at bedside.  He has no new complaints.   Assessment/Plan: Principal Problem:   Evaluation by psychiatric service required Active Problems:   Anxiety state   OBSTRUCTIVE SLEEP APNEA   GERD   Hypoxia   Edema   Hypoxemia   Dementia   Altered mental status  Acute hypoxic respiratory failure of unclear etiology Chest x-ray unremarkable for lobular infiltrates Hypoxia has resolved Good O2 saturation on room air  Acute dehydration, resolving Encourage increase in po fluid intake Continue IV fluid NS at 75cc/hr BUN to creatinine  ratio >20  B/L LE cellulitis, improving C/w doxycycline  Acute metabolic encephalopathy in the setting of dementia, improving Appears to be back at his baseline Caseworker working on obtaining more information regarding guardianship Landscape architect at bedside Reorient as needed Fall precaution CT head done on 02-25-2018 no intracranial abnormalities  Suspected scabies Given permethrin cream on 02/15/2018 scabies testing to confirm Contact precaution  B/l LE chronic stasis dermatitis/suspected b/l LE cellulitis c/w doxycycline 100 mg po bid  Hyperlipidemia Continue pravastatin  Normocytic anemia Hemoglobin 10.7 No sign of overt bleeding Unclear of his baseline Repeat CBC in the morning  Code Status: Full code suspected scabies  Family Communication: None at bedside  Disposition Plan: SNF; CSW attempting to contact family/relatives/friends for guardianship   Consultants:  CSW  Wound care specialist  Procedures:  None  Antimicrobials: doxycycline  DVT prophylaxis:  sq lovenox   Objective: Vitals:   02/18/18 1203 02/18/18 1813 02/19/18 1136 02/20/18 0330  BP: 120/76 120/72 130/69 127/78  Pulse: 62 63 64 62  Resp: 18 18 18 20   Temp: 97.7 F (36.5 C) 98.5 F (36.9 C) 97.6 F (36.4 C) 98.7 F (37.1 C)  TempSrc: Oral Oral Oral Oral  SpO2: 100% 100% 100% 98%  Weight:      Height:        Intake/Output Summary (Last 24 hours) at 02/20/2018 1239 Last data filed at 02/19/2018 1700 Gross per 24 hour  Intake 580 ml  Output -  Net 580 ml   Filed Weights   02/13/18 2030 Feb 25, 2018 0009  Weight: 82.1 kg (181 lb) 75.8 kg (167 lb 1.7 oz)  Exam:  . General: 84 y.o. year-old male well-developed well-nourished in no acute distress.  Alert in the setting of advanced dementia . Respiratory: Clear to auscultation with no wheezes or rales.  Good inspiratory effort.   . Cardiovascular: RRR no rubs or gallops. No JVD or thyromegaly  . Abdomen: Soft nontender  nondistended with normal bowel sounds x4 quadrants. . Musculoskeletal: No lower extremity edema. 2/4 pulses in all 4 extremities. . Skin: b/l lower extremity chronic stasis dermatitis, improving. Rash, pruritus affecting anterior right upper extremity, lowe extremities, abdomen. LE erythema from ankle down. Marland Kitchen Psychiatry: Mood is appropriate for condition and setting   Data Reviewed: CBC: Recent Labs  Lab 02/13/18 2118 02/14/18 0806 02/17/18 0404  WBC 4.2 4.3 10.0  NEUTROABS 1.9  --   --   HGB 11.9* 10.4* 10.3*  HCT 36.9* 32.4* 31.6*  MCV 90.9 91.3 90.0  PLT 281 223 831   Basic Metabolic Panel: Recent Labs  Lab 02/13/18 2118 02/14/18 0806 02/17/18 0404 02/20/18 0605  NA 142 140 137  --   K 4.1 4.2 4.0  --   CL 105 106 102  --   CO2 32 30 27  --   GLUCOSE 105* 100* 105*  --   BUN 21* 20 32*  --   CREATININE 1.01 0.82 0.80 0.73  CALCIUM 8.6* 7.9* 8.2*  --    GFR: Estimated Creatinine Clearance: 73.7 mL/min (by C-G formula based on SCr of 0.73 mg/dL). Liver Function Tests: Recent Labs  Lab 02/13/18 2118 02/14/18 0806  AST 27 20  ALT 20 16*  ALKPHOS 53 40  BILITOT 0.7 0.5  PROT 6.3* 5.0*  ALBUMIN 3.5 2.7*   No results for input(s): LIPASE, AMYLASE in the last 168 hours. No results for input(s): AMMONIA in the last 168 hours. Coagulation Profile: No results for input(s): INR, PROTIME in the last 168 hours. Cardiac Enzymes: No results for input(s): CKTOTAL, CKMB, CKMBINDEX, TROPONINI in the last 168 hours. BNP (last 3 results) No results for input(s): PROBNP in the last 8760 hours. HbA1C: No results for input(s): HGBA1C in the last 72 hours. CBG: Recent Labs  Lab 02/14/18 1211  GLUCAP 101*   Lipid Profile: No results for input(s): CHOL, HDL, LDLCALC, TRIG, CHOLHDL, LDLDIRECT in the last 72 hours. Thyroid Function Tests: No results for input(s): TSH, T4TOTAL, FREET4, T3FREE, THYROIDAB in the last 72 hours. Anemia Panel: No results for input(s):  VITAMINB12, FOLATE, FERRITIN, TIBC, IRON, RETICCTPCT in the last 72 hours. Urine analysis:    Component Value Date/Time   COLORURINE YELLOW 02/14/2018 1021   APPEARANCEUR CLEAR 02/14/2018 1021   LABSPEC 1.010 02/14/2018 1021   PHURINE 7.0 02/14/2018 1021   GLUCOSEU NEGATIVE 02/14/2018 1021   HGBUR NEGATIVE 02/14/2018 1021   BILIRUBINUR NEGATIVE 02/14/2018 1021   [place] 02/14/2018 1021   PROTEINUR NEGATIVE 02/14/2018 1021   NITRITE NEGATIVE 02/14/2018 1021   LEUKOCYTESUR NEGATIVE 02/14/2018 1021   Sepsis Labs: @LABRCNTIP (procalcitonin:4,lacticidven:4)  ) Recent Results (from the past 240 hour(s))  Culture, Urine     Status: None   Collection Time: 02/14/18 10:21 AM  Result Value Ref Range Status   Specimen Description   Final    URINE, RANDOM Performed at [facility], [place] [address], [place], [place] [address]    Special Requests   Final    NONE Performed at [facility], [place] [address], [place], [place] [address]    Culture   Final    NO GROWTH Performed  at [facility], [place] [address] [place], [place] [address]    Report Status 02/15/2018 FINAL  Final  Arthropod identification     Status: None   Collection Time: 02/15/18 12:45 PM  Result Value Ref Range Status   Parasite ID, Arthropod Comment  Final    Comment: (NOTE) Nonparasitic material received. Performed At: [facility] [place], [place] [id number] Rush Farmer MD [id number] Performed at [facility], [place] [address], [place], [place] [address]   MRSA PCR Screening     Status: None   Collection Time: 02/16/18  9:45 AM  Result Value Ref Range Status   MRSA by PCR NEGATIVE NEGATIVE Final    Comment:        The GeneXpert MRSA Assay (FDA approved for NASAL specimens only), is one component of a comprehensive MRSA colonization surveillance program. It is not intended to diagnose MRSA infection nor to  guide or monitor treatment for MRSA infections. Performed at [facility], [place] [address], [place], [place] [address]       Studies: No results found.  Scheduled Meds: . doxycycline  100 mg Oral Q12H  . enoxaparin (LOVENOX) injection  40 mg Subcutaneous QHS  . pravastatin  10 mg Oral Daily    Continuous Infusions: . sodium chloride 75 mL/hr at 02/20/18 0910     LOS: 7 days     Kayleen Memos, MD [facility] Pager [phone]  If 7PM-7AM, please contact night-coverage [email or web address] Password [facility] 02/20/2018, 12:39 PM

## 2018-02-21 MED ORDER — LORAZEPAM 2 MG/ML IJ SOLN
0.5000 mg | Freq: Once | INTRAMUSCULAR | Status: AC
  Administered 2018-02-21: 0.5 mg via INTRAVENOUS
  Filled 2018-02-21: qty 1

## 2018-02-21 MED ORDER — HYDROCERIN EX CREA
TOPICAL_CREAM | Freq: Two times a day (BID) | CUTANEOUS | Status: DC
  Administered 2018-02-21: 23:00:00 via TOPICAL
  Administered 2018-02-22: 1 via TOPICAL
  Administered 2018-02-22 – 2018-02-23 (×2): via TOPICAL
  Administered 2018-02-23: 1 via TOPICAL
  Administered 2018-02-24 (×2): via TOPICAL
  Administered 2018-02-25: 1 via TOPICAL
  Administered 2018-02-25 – 2018-03-04 (×14): via TOPICAL
  Administered 2018-03-04: 1 via TOPICAL
  Administered 2018-03-05 – 2018-03-08 (×7): via TOPICAL
  Filled 2018-02-21 (×3): qty 113

## 2018-02-21 NOTE — Progress Notes (Signed)
Physical Therapy Treatment Patient Details Name: [name] [name] MRN: [id number] DOB: [birth year] Today's Date: 02/21/2018    History of Present Illness 84 year old with past medical history relevant for dementia, hyperlipidemia, possible mood disorder who was initially visiting his wife who was hospitalized for diarrhea.  She subsequently had a code event and was transferred to the ICU.  Patient was noted to be altered and quite confused and initially hypoxic.  Patient was admitted for altered mental status and hypoxia.    PT Comments    Pt I bed with safety sitter in room.  Assisted OOB to amb a great distance hand held assist.  Mild unsteady gait and impaired safety cognition.  HIGH fall risk   Follow Up Recommendations  Supervision/Assistance - 24 hour     Equipment Recommendations  None recommended by PT    Recommendations for Other Services       Precautions / Restrictions Precautions Precautions: Fall Precaution Comments: Hx dementia Restrictions Weight Bearing Restrictions: No    Mobility  Bed Mobility Overal bed mobility: Modified Independent                Transfers Overall transfer level: Needs assistance Equipment used: None Transfers: Sit to/from Stand;Stand Pivot Transfers Sit to Stand: Supervision;Min guard Stand pivot transfers: Supervision;Min guard       General transfer comment: 50% VC's for task completion and direction  Ambulation/Gait Ambulation/Gait assistance: Supervision;Min guard Gait Distance (Feet): 400 Feet Assistive device: None Gait Pattern/deviations: Step-through pattern;Decreased stride length;Drifts right/left;Trunk flexed;Shuffle Gait velocity: WFL   General Gait Details: able to amb well with no need for any AD and alternating gait.  Impaired safety cognition and envirnmental awareness makes him a FALL RISK.     Stairs             Wheelchair Mobility    Modified Rankin (Stroke Patients Only)        Balance                                            Cognition Arousal/Alertness: Awake/alert Behavior During Therapy: Impulsive   Area of Impairment: Orientation;Attention;Memory;Safety/judgement;Awareness;Problem solving                     Memory: Decreased short-term memory;Decreased recall of precautions   Safety/Judgement: Decreased awareness of safety;Decreased awareness of deficits     General Comments: talkative with rambling random conversation occassional switch to current but quickly back to random      following repeated one step commands      Exercises      General Comments        Pertinent Vitals/Pain Pain Assessment: No/denies pain    Home Living                      Prior Function            PT Goals (current goals can now be found in the care plan section) Progress towards PT goals: Progressing toward goals    Frequency    Min 3X/week      PT Plan Current plan remains appropriate    Co-evaluation              AM-PAC PT "6 Clicks" Daily Activity  Outcome Measure  Difficulty turning over in bed (including adjusting bedclothes, sheets and blankets)?: None Difficulty moving from lying  on back to sitting on the side of the bed? : A Little Difficulty sitting down on and standing up from a chair with arms (e.g., wheelchair, bedside commode, etc,.)?: A Little Help needed moving to and from a bed to chair (including a wheelchair)?: A Little Help needed walking in hospital room?: A Little Help needed climbing 3-5 steps with a railing? : A Little 6 Click Score: 19    End of Session Equipment Utilized During Treatment: Gait belt Activity Tolerance: Patient tolerated treatment well Patient left: in bed;with call bell/phone within reach;with bed alarm set   PT Visit Diagnosis: Other abnormalities of gait and mobility (R26.89)     Time: 5790-3833 PT Time Calculation (min) (ACUTE ONLY): 17 min  Charges:   $Gait Training: 8-22 mins                    G Codes:       Rica Koyanagi  PTA [facility]  Acute  Rehab Pager      [phone]

## 2018-02-21 NOTE — Progress Notes (Signed)
PROGRESS NOTE    [name] [name]  IEP:[id number] DOB: 1931-05-18 DOA: 02/13/2018 PCP: Leanna Battles, MD   Brief Narrative: [name] [name] is a 84 y.o. male with a history of dementia, hyperlipidemia.  Patient presented with dyspnea and confusion.  He was treated with oxygen and was weaned to room air.  Unknown etiology for respiratory failure.  He was found to have bilateral lower extremity cellulitis in addition to possible scabies, for which she was treated on 6/11.   Assessment & Plan:   Principal Problem:   Evaluation by psychiatric service required Active Problems:   Anxiety state   OBSTRUCTIVE SLEEP APNEA   GERD   Hypoxia   Edema   Hypoxemia   Dementia   Altered mental status   Acute respiratory failure with hypoxia Unknown etiology. Resolved  Dehydration Given IV fluids. Resolved.  Bilateral LE cellulitis -Doxycycline  Acute metabolic encephalopathy Improved per chart review and appears patient is back to baseline. Possibly secondary to infection  ?Scabies Patient treated with permethrin cream on 6/11  Normocytic anemia Stable.   DVT prophylaxis: Lovenox Code Status:   Code Status: Full Code Family Communication: None at bedside Disposition Plan: Discharge to SNF when bed is available and POA issues resolved   Consultants:   None  Procedures:   None  Antimicrobials:  Doxycycline    Subjective: No issues overnight  Objective: Vitals:   02/20/18 1416 02/20/18 2112 02/21/18 0530 02/21/18 1313  BP: 125/71 (!) 152/96 (!) 144/90 114/76  Pulse: 66 (!) 59 68 64  Resp: 20 (!) 23 19 20   Temp: 98.8 F (37.1 C) 98.4 F (36.9 C) 98.3 F (36.8 C) (!) 97.5 F (36.4 C)  TempSrc: Oral Axillary Axillary Oral  SpO2: 99% 100% 100% 100%  Weight:      Height:        Intake/Output Summary (Last 24 hours) at 02/21/2018 1442 Last data filed at 02/21/2018 1247 Gross per 24 hour  Intake 4448.75 ml  Output -  Net 6606.75 ml   Filed  Weights   02/13/18 2030 02/14/18 0009  Weight: 82.1 kg (181 lb) 75.8 kg (167 lb 1.7 oz)    Examination:  General exam: Appears calm and comfortable Respiratory system: Clear to auscultation. Respiratory effort normal. Cardiovascular system: S1 & S2 heard, RRR. No murmurs, rubs, gallops or clicks. Gastrointestinal system: Abdomen is nondistended, soft and nontender. No organomegaly or masses felt. Normal bowel sounds heard. Central nervous system: Alert and not oriented. No focal neurological deficits.  Extremities: No edema. No calf tenderness Psychiatry: Flat affect, tangential    Data Reviewed: I have personally reviewed following labs and imaging studies  CBC: Recent Labs  Lab 02/17/18 0404  WBC 10.0  HGB 10.3*  HCT 31.6*  MCV 90.0  PLT 301   Basic Metabolic Panel: Recent Labs  Lab 02/17/18 0404 02/20/18 0605  NA 137  --   K 4.0  --   CL 102  --   CO2 27  --   GLUCOSE 105*  --   BUN 32*  --   CREATININE 0.80 0.73  CALCIUM 8.2*  --    GFR: Estimated Creatinine Clearance: 73.7 mL/min (by C-G formula based on SCr of 0.73 mg/dL). Liver Function Tests: No results for input(s): AST, ALT, ALKPHOS, BILITOT, PROT, ALBUMIN in the last 168 hours. No results for input(s): LIPASE, AMYLASE in the last 168 hours. No results for input(s): AMMONIA in the last 168 hours. Coagulation Profile: No results for input(s): INR, PROTIME  in the last 168 hours. Cardiac Enzymes: No results for input(s): CKTOTAL, CKMB, CKMBINDEX, TROPONINI in the last 168 hours. BNP (last 3 results) No results for input(s): PROBNP in the last 8760 hours. HbA1C: No results for input(s): HGBA1C in the last 72 hours. CBG: No results for input(s): GLUCAP in the last 168 hours. Lipid Profile: No results for input(s): CHOL, HDL, LDLCALC, TRIG, CHOLHDL, LDLDIRECT in the last 72 hours. Thyroid Function Tests: No results for input(s): TSH, T4TOTAL, FREET4, T3FREE, THYROIDAB in the last 72 hours. Anemia  Panel: No results for input(s): VITAMINB12, FOLATE, FERRITIN, TIBC, IRON, RETICCTPCT in the last 72 hours. Sepsis Labs: No results for input(s): PROCALCITON, LATICACIDVEN in the last 168 hours.  Recent Results (from the past 240 hour(s))  Culture, Urine     Status: None   Collection Time: 02/14/18 10:21 AM  Result Value Ref Range Status   Specimen Description   Final    URINE, RANDOM Performed at [place] [address], [place], [place] [address]    Special Requests   Final    NONE Performed at [facility], [place] [address] [place], [place] [address]    Culture   Final    NO GROWTH Performed at [facility], [place] [address], [place], [place] [address]    Report Status 02/15/2018 FINAL  Final  Arthropod identification     Status: None   Collection Time: 02/15/18 12:45 PM  Result Value Ref Range Status   Parasite ID, Arthropod Comment  Final    Comment: (NOTE) Nonparasitic material received. Performed At: [facility] [place], [place] [id number] Rush Farmer MD [id number] Performed at [facility], [place] [address] [place], [place] [address]   MRSA PCR Screening     Status: None   Collection Time: 02/16/18  9:45 AM  Result Value Ref Range Status   MRSA by PCR NEGATIVE NEGATIVE Final    Comment:        The GeneXpert MRSA Assay (FDA approved for NASAL specimens only), is one component of a comprehensive MRSA colonization surveillance program. It is not intended to diagnose MRSA infection nor to guide or monitor treatment for MRSA infections. Performed at [facility], [place] [address], [place], [place] [address]          Radiology Studies: No results found.      Scheduled Meds: . doxycycline  100 mg Oral Q12H  . enoxaparin (LOVENOX) injection  40 mg Subcutaneous QHS  . hydrocerin   Topical BID  . pravastatin  10 mg Oral Daily   Continuous  Infusions: . sodium chloride 75 mL/hr at 02/21/18 1001     LOS: 8 days     Cordelia Poche, MD [facility] 02/21/2018, 2:42 PM Pager: [phone]  If 7PM-7AM, please contact night-coverage [email or web address] 02/21/2018, 2:42 PM

## 2018-02-21 NOTE — NC FL2 (Addendum)
[place] LEVEL OF CARE SCREENING TOOL     IDENTIFICATION  Patient Name: [name] [name] Birthdate: 12/25/1930 Sex: male Admission Date (Current Location): 02/13/2018  [facility] and [place] Number:  Herbalist and Address:  [facility],  [place], [place]      Provider Number: [id number]  Attending Physician Name and Address:  Mariel Aloe, MD  Relative Name and Phone Number:       Current Level of Care: Hospital Recommended Level of Care: Memory Care Prior Approval Number:    Date Approved/Denied:   PASRR Number: [id number] RSVP referral ID   Discharge Plan: Other (Comment)(assisted living-memory care)    Current Diagnoses: Patient Active Problem List   Diagnosis Date Noted  . Evaluation by psychiatric service required   . Hypoxia 02/13/2018  . Edema 02/13/2018  . Hypoxemia 02/13/2018  . Dementia 02/13/2018  . Altered mental status 02/13/2018  . Dermatitis 10/08/2015  . CHEST PAIN, ATYPICAL 04/11/2010  . Personal history of colon polyps and family history of colon cancer 12/19/2007  . Anxiety state 12/19/2007  . DEPRESSION 12/19/2007  . OBSTRUCTIVE SLEEP APNEA 12/19/2007  . ALLERGIC CONJUNCTIVITIS 12/19/2007  . Seasonal allergic rhinitis 12/19/2007  . GERD 12/19/2007  . IBS 12/19/2007    Orientation RESPIRATION BLADDER Height & Weight     Self, Place  Normal((PRN cpap, pt does not use)) Continent Weight: 167 lb 1.7 oz (75.8 kg) Height:  6' 2.5" (189.2 cm)  BEHAVIORAL SYMPTOMS/MOOD NEUROLOGICAL BOWEL NUTRITION STATUS      Continent Diet(heart healthy diet)  AMBULATORY STATUS COMMUNICATION OF NEEDS Skin   Supervision Verbally Normal                       Personal Care Assistance Level of Assistance  Bathing, Feeding, Dressing Bathing Assistance: Limited assistance Feeding assistance: Independent Dressing Assistance: Independent     Functional Limitations Info  Sight, Hearing, Speech Sight  Info: Adequate Hearing Info: Adequate Speech Info: Adequate    SPECIAL CARE FACTORS FREQUENCY                       Contractures Contractures Info: Not present    Additional Factors Info  Isolation Precautions         Isolation Precautions Info: contact precautions- being treated for suspected scabies     Current Medications (02/21/2018):  This is the current hospital active medication list Current Facility-Administered Medications  Medication Dose Route Frequency Provider Last Rate Last Dose  . 0.9 %  sodium chloride infusion   Intravenous Continuous Kayleen Memos, DO 75 mL/hr at 02/21/18 1001    . acetaminophen (TYLENOL) tablet 650 mg  650 mg Oral Q6H PRN Bodenheimer, Charles A, NP   650 mg at 02/18/18 0200  . diphenhydrAMINE (BENADRYL) 12.5 MG/5ML elixir 12.5 mg  12.5 mg Oral Q8H PRN Gardiner Barefoot, NP   12.5 mg at 02/21/18 0707  . doxycycline (VIBRA-TABS) tablet 100 mg  100 mg Oral Q12H Hall, Carole N, DO   100 mg at 02/21/18 1002  . enoxaparin (LOVENOX) injection 40 mg  40 mg Subcutaneous QHS Gala Romney L, MD   40 mg at 02/20/18 2055  . pravastatin (PRAVACHOL) tablet 10 mg  10 mg Oral Daily Elwyn Reach, MD   10 mg at 02/21/18 1002     Discharge Medications: Please see discharge summary for a list of discharge medications.  Medication List  STOP taking these medications   clidinium-chlordiazePOXIDE 5-2.5 MG capsule Commonly known as:  LIBRAX   divalproex 125 MG capsule Commonly known as:  DEPAKOTE SPRINKLE   donepezil 5 MG tablet Commonly known as:  ARICEPT   flunisolide 29 MCG/ACT nasal spray Commonly known as:  NASAREL   ketorolac 0.5 % ophthalmic solution Commonly known as:  ACULAR   mometasone 0.1 % ointment Commonly known as:  ELOCON   omeprazole 20 MG capsule Commonly known as:  PRILOSEC   triamcinolone cream 0.1 % Commonly known as:  KENALOG     TAKE these medications   acetaminophen 325 MG  tablet Commonly known as:  TYLENOL Take 2 tablets (650 mg total) by mouth every 6 (six) hours as needed for mild pain or headache.   hydrOXYzine 25 MG tablet Commonly known as:  ATARAX/VISTARIL Take 1 tablet (25 mg total) by mouth 3 (three) times daily as needed for itching. What changed:    how much to take  how to take this  when to take this  reasons to take this  additional instructions   pravastatin 10 MG tablet Commonly known as:  PRAVACHOL Take 1 tablet (10 mg total) by mouth daily. Start taking on:  03/09/2018   Vitamin D 2000 units tablet Take 1 tablet (2,000 Units total) by mouth daily.       Relevant Imaging Results:  Relevant Lab Results:   Additional Information SS# [id number]  Nila Nephew, LCSW

## 2018-02-21 NOTE — Progress Notes (Signed)
CSW assisting with disposition- see past notes, complex social situation. Today, spoke with pt's POA [name] [name] [phone] (cell [phone]). She is willing to accept responsibility for pt's medical and financial decisions. Explains she was close friends with pt and his now deceased wife. States, "We are planning his wife's funeral for tomorrow. We know she had started looking into assisted living facilities for him, so I would very much agree to keep working on that plan." States pt's nephews [name] [name] and [name] [name] (contact numbers in previous notes) will be coming back into town tonight- she states they are helping her work to locate pt's financial resources to pay for ALF care.  CSW, with POAs permission, began referral process for area ALFs (seeking facilities with memory care due to pt's dx of dementia and related care needs). Will continue following. Updated CSW AD.  [name] [name], MSW, LCSW Clinical Social Work 02/21/2018 [phone]

## 2018-02-22 LAB — OCCULT BLOOD X 1 CARD TO LAB, STOOL: Fecal Occult Bld: NEGATIVE

## 2018-02-22 NOTE — Progress Notes (Signed)
Pt being assessed by ALF memory care representative today (arranged with pt's POA). POA states she is going to visit facility tomorrow (pt's wife's funeral services are today, family not available). Pt has referrals pending at other ALFs as well. Submitted RSVP Social [employer] authorization request (referral ID documented on FL2). Will continue following.  Sharren Bridge, MSW, LCSW Clinical Social Work 02/22/2018 [phone]

## 2018-02-22 NOTE — Care Management Important Message (Signed)
Important Message  Patient Details IM Letter given to Nora/Case Manager to present to the Patient Name: [name] [name] MRN: [id number] Date of Birth: 22-Jan-1931   Medicare Important Message Given:  Yes    Kerin Salen 02/22/2018, 10:20 AMImportant Message  Patient Details  Name: [name] [name] MRN: [id number] Date of Birth: 12/05/1930   Medicare Important Message Given:  Yes    Kerin Salen 02/22/2018, 10:20 AM

## 2018-02-22 NOTE — Progress Notes (Signed)
PROGRESS NOTE    [name] [name]  OEV:[id number] DOB: Sep 12, 1930 DOA: 02/13/2018 PCP: Leanna Battles, MD   Brief Narrative: [name] [name] is a 84 y.o. male with a history of dementia, hyperlipidemia.  Patient presented with dyspnea and confusion.  He was treated with oxygen and was weaned to room air.  Unknown etiology for respiratory failure.  He was found to have bilateral lower extremity cellulitis in addition to possible scabies, for which she was treated on 6/11.   Assessment & Plan:   Principal Problem:   Evaluation by psychiatric service required Active Problems:   Anxiety state   OBSTRUCTIVE SLEEP APNEA   GERD   Hypoxia   Edema   Hypoxemia   Dementia   Altered mental status   Acute respiratory failure with hypoxia Unknown etiology. Resolved   Dehydration Given IV fluids. Resolved.  Bilateral LE cellulitis -Doxycycline  Acute metabolic encephalopathy Improved per chart review and appears patient is back to baseline. Possibly secondary to infection  ?Scabies Patient treated with permethrin cream on 6/11. No concern for scabies on my examination  Normocytic anemia Stable. ?GI bleeding. -FOBT -Iron panel -CBC in AM   DVT prophylaxis: Lovenox Code Status:   Code Status: Full Code Family Communication: None at bedside Disposition Plan: Discharge to SNF when bed is available and POA issues resolved   Consultants:   None  Procedures:   None  Antimicrobials:  Doxycycline    Subjective: No issues  Objective: Vitals:   02/21/18 0530 02/21/18 1313 02/21/18 2056 02/22/18 0602  BP: (!) 144/90 114/76 119/78 124/72  Pulse: 68 64 79 84  Resp: 19 20 18 16   Temp: 98.3 F (36.8 C) (!) 97.5 F (36.4 C) 98.6 F (37 C) 98.4 F (36.9 C)  TempSrc: Axillary Oral Oral Oral  SpO2: 100% 100% 100% 100%  Weight:      Height:        Intake/Output Summary (Last 24 hours) at 02/22/2018 1100 Last data filed at 02/22/2018 0810 Gross per 24  hour  Intake 1200 ml  Output -  Net 1200 ml   Filed Weights   02/13/18 2030 02/14/18 0009  Weight: 82.1 kg (181 lb) 75.8 kg (167 lb 1.7 oz)    Examination:  General exam: Appears calm and comfortable Respiratory system: Respiratory effort normal. Central nervous system: Alert and not oriented. Psychiatry: Flat affect, tangential    Data Reviewed: I have personally reviewed following labs and imaging studies  CBC: Recent Labs  Lab 02/17/18 0404  WBC 10.0  HGB 10.3*  HCT 31.6*  MCV 90.0  PLT 829   Basic Metabolic Panel: Recent Labs  Lab 02/17/18 0404 02/20/18 0605  NA 137  --   K 4.0  --   CL 102  --   CO2 27  --   GLUCOSE 105*  --   BUN 32*  --   CREATININE 0.80 0.73  CALCIUM 8.2*  --    GFR: Estimated Creatinine Clearance: 73.7 mL/min (by C-G formula based on SCr of 0.73 mg/dL). Liver Function Tests: No results for input(s): AST, ALT, ALKPHOS, BILITOT, PROT, ALBUMIN in the last 168 hours. No results for input(s): LIPASE, AMYLASE in the last 168 hours. No results for input(s): AMMONIA in the last 168 hours. Coagulation Profile: No results for input(s): INR, PROTIME in the last 168 hours. Cardiac Enzymes: No results for input(s): CKTOTAL, CKMB, CKMBINDEX, TROPONINI in the last 168 hours. BNP (last 3 results) No results for input(s): PROBNP in the last  8760 hours. HbA1C: No results for input(s): HGBA1C in the last 72 hours. CBG: No results for input(s): GLUCAP in the last 168 hours. Lipid Profile: No results for input(s): CHOL, HDL, LDLCALC, TRIG, CHOLHDL, LDLDIRECT in the last 72 hours. Thyroid Function Tests: No results for input(s): TSH, T4TOTAL, FREET4, T3FREE, THYROIDAB in the last 72 hours. Anemia Panel: No results for input(s): VITAMINB12, FOLATE, FERRITIN, TIBC, IRON, RETICCTPCT in the last 72 hours. Sepsis Labs: No results for input(s): PROCALCITON, LATICACIDVEN in the last 168 hours.  Recent Results (from the past 240 hour(s))  Culture,  Urine     Status: None   Collection Time: 02/14/18 10:21 AM  Result Value Ref Range Status   Specimen Description   Final    URINE, RANDOM Performed at [facility] [address] [place], [place] [address]    Special Requests   Final    NONE Performed at [facility], [place] [address] [place], [place] [address]    Culture   Final    NO GROWTH Performed at [facility], [place] [address], [place], [place] [address]    Report Status 02/15/2018 FINAL  Final  Arthropod identification     Status: None   Collection Time: 02/15/18 12:45 PM  Result Value Ref Range Status   Parasite ID, Arthropod Comment  Final    Comment: (NOTE) Nonparasitic material received. Performed At: [facility] [place], [place] [id number] Rush Farmer MD [id number] Performed at [facility], [place] [address], [place], [place] [address]   MRSA PCR Screening     Status: None   Collection Time: 02/16/18  9:45 AM  Result Value Ref Range Status   MRSA by PCR NEGATIVE NEGATIVE Final    Comment:        The GeneXpert MRSA Assay (FDA approved for NASAL specimens only), is one component of a comprehensive MRSA colonization surveillance program. It is not intended to diagnose MRSA infection nor to guide or monitor treatment for MRSA infections. Performed at [facility], [place] [address] [place], [place] [address]          Radiology Studies: No results found.      Scheduled Meds: . doxycycline  100 mg Oral Q12H  . enoxaparin (LOVENOX) injection  40 mg Subcutaneous QHS  . hydrocerin   Topical BID  . pravastatin  10 mg Oral Daily   Continuous Infusions: . sodium chloride 75 mL/hr at 02/22/18 0355     LOS: 9 days     Cordelia Poche, MD [facility] 02/22/2018, 11:00 AM Pager: [phone]  If 7PM-7AM, please contact night-coverage [email or web address] 02/22/2018, 11:00 AM

## 2018-02-23 LAB — CBC
HEMATOCRIT: 33.2 % — AB (ref 39.0–52.0)
HEMOGLOBIN: 11 g/dL — AB (ref 13.0–17.0)
MCH: 29.3 pg (ref 26.0–34.0)
MCHC: 33.1 g/dL (ref 30.0–36.0)
MCV: 88.5 fL (ref 78.0–100.0)
Platelets: 257 10*3/uL (ref 150–400)
RBC: 3.75 MIL/uL — AB (ref 4.22–5.81)
RDW: 14 % (ref 11.5–15.5)
WBC: 4.9 10*3/uL (ref 4.0–10.5)

## 2018-02-23 LAB — FERRITIN: Ferritin: 53 ng/mL (ref 24–336)

## 2018-02-23 LAB — IRON AND TIBC
Iron: 55 ug/dL (ref 45–182)
SATURATION RATIOS: 20 % (ref 17.9–39.5)
TIBC: 271 ug/dL (ref 250–450)
UIBC: 216 ug/dL

## 2018-02-23 LAB — RETICULOCYTES
RBC.: 3.75 MIL/uL — AB (ref 4.22–5.81)
Retic Count, Absolute: 52.5 10*3/uL (ref 19.0–186.0)
Retic Ct Pct: 1.4 % (ref 0.4–3.1)

## 2018-02-23 LAB — VITAMIN B12: Vitamin B-12: 265 pg/mL (ref 180–914)

## 2018-02-23 LAB — FOLATE: FOLATE: 16.9 ng/mL (ref >5.9)

## 2018-02-23 NOTE — Progress Notes (Signed)
Per Marcene Brawn, Infection Prevention to discontinue contact isolation order since scabies have been treated.

## 2018-02-23 NOTE — Progress Notes (Signed)
Spoke with pt's POA- expecting to tour ALF memory care units with pt's nephew tomorrow (funeral services for pt's wife were held in [place] yesterday, and today family and friends going to [place] for wife's burial at family cemetery there). POA still working to access pt's finances.  Sharren Bridge, MSW, LCSW Clinical Social Work 02/23/2018 [phone]

## 2018-02-23 NOTE — Progress Notes (Signed)
Physical Therapy Treatment Patient Details Name: [name] [name] MRN: [id number] DOB: Feb 13, 1931 Today's Date: 02/23/2018    History of Present Illness 84 year old with past medical history relevant for dementia, hyperlipidemia, possible mood disorder who was initially visiting his wife who was hospitalized for diarrhea.  She subsequently had a code event and was transferred to the ICU.  Patient was noted to be altered and quite confused and initially hypoxic.  Patient was admitted for altered mental status and hypoxia.    PT Comments    Pt OOB in recliner with safety sitter in room.  Assisted with amb a great distance in hallway.  HHA no AD due to impaired cognition.  Good alternating gait slight lateral LOB with head turns/distration.     Follow Up Recommendations  Supervision/Assistance - 24 hour(ALF Memory Unit)     Equipment Recommendations  None recommended by PT    Recommendations for Other Services       Precautions / Restrictions Precautions Precautions: Fall Precaution Comments: Hx dementia Restrictions Weight Bearing Restrictions: No    Mobility  Bed Mobility               General bed mobility comments: OOB in recliner  Transfers Overall transfer level: Needs assistance Equipment used: None Transfers: Sit to/from Stand;Stand Pivot Transfers Sit to Stand: Supervision;Min guard Stand pivot transfers: Supervision;Min guard       General transfer comment: 50% VC's for task completion and direction  Ambulation/Gait Ambulation/Gait assistance: Supervision;Min guard Gait Distance (Feet): 550 Feet Assistive device: None Gait Pattern/deviations: Step-through pattern;Decreased stride length;Drifts right/left;Trunk flexed;Shuffle Gait velocity: WFL   General Gait Details: able to amb well with no need for any AD and alternating gait.  Impaired safety cognition and envirnmental awareness makes him a FALL RISK.     Stairs              Wheelchair Mobility    Modified Rankin (Stroke Patients Only)       Balance                                            Cognition Arousal/Alertness: Awake/alert Behavior During Therapy: Impulsive Overall Cognitive Status: No family/caregiver present to determine baseline cognitive functioning Area of Impairment: Orientation;Attention;Memory;Safety/judgement;Awareness;Problem solving                 Orientation Level: Place;Time;Situation             General Comments: talkative with rambling random conversation occassional switch to current but quickly back to random      following repeated one step commands      Exercises      General Comments        Pertinent Vitals/Pain      Home Living                      Prior Function            PT Goals (current goals can now be found in the care plan section) Progress towards PT goals: Progressing toward goals    Frequency    Min 3X/week      PT Plan Current plan remains appropriate    Co-evaluation              AM-PAC PT "6 Clicks" Daily Activity  Outcome Measure  Difficulty turning over in bed (including adjusting bedclothes,  sheets and blankets)?: None Difficulty moving from lying on back to sitting on the side of the bed? : A Little Difficulty sitting down on and standing up from a chair with arms (e.g., wheelchair, bedside commode, etc,.)?: A Little Help needed moving to and from a bed to chair (including a wheelchair)?: A Little Help needed walking in hospital room?: A Little Help needed climbing 3-5 steps with a railing? : A Little 6 Click Score: 19    End of Session Equipment Utilized During Treatment: Gait belt Activity Tolerance: Patient tolerated treatment well Patient left: in chair;with nursing/sitter in room;with call bell/phone within reach   PT Visit Diagnosis: Other abnormalities of gait and mobility (R26.89)     Time: 1771-1657 PT Time  Calculation (min) (ACUTE ONLY): 20 min  Charges:  $Gait Training: 8-22 mins                    G Codes:       Rica Koyanagi  PTA [facility]  Acute  Rehab Pager      [phone]

## 2018-02-23 NOTE — Progress Notes (Signed)
PROGRESS NOTE    [name] [name]  VHQ:[id number] DOB: Jun 01, 1931 DOA: 02/13/2018 PCP: Leanna Battles, MD    Brief Narrative: 84 y.o. male with a history of dementia, hyperlipidemia.  Patient presented with dyspnea and confusion.  He was treated with oxygen and was weaned to room air.  Unknown etiology for respiratory failure.  He was found to have bilateral lower extremity cellulitis in addition to possible scabies, for which she was treated on 6/11.     Assessment & Plan:   Principal Problem:   Evaluation by psychiatric service required Active Problems:   Anxiety state   OBSTRUCTIVE SLEEP APNEA   GERD   Hypoxia   Edema   Hypoxemia   Dementia   Altered mental status   Acute respiratory failure with hypoxia Unknown etiology. Resolved   Dehydration Given IV fluids. Resolved.  Bilateral LE cellulitis -Doxycycline  Acute metabolic encephalopathy Improved per chart review and appears patient is back to baseline. Possibly secondary to infection  ?Scabies Patient treated with permethrin cream on 6/11. No concern for scabies on my examination  Normocytic anemia Stable. ?GI bleeding. -FOBT -Iron panel -CBC in AM     DVT prophylaxis: Lovenox Code Status: Full code Family Communication: No family available Disposition Plan: Discharge to SNF when bed available  Consultants: None   Procedures: None Antimicrobials: Doxycycline  Subjective: Resting in bed in no acute distress   Objective: Vitals:   02/22/18 0602 02/22/18 1406 02/22/18 2127 02/23/18 0535  BP: 124/72 123/77 137/81 123/76  Pulse: 84 64 (!) 57 64  Resp: 16 14 16 16   Temp: 98.4 F (36.9 C) (!) 97.5 F (36.4 C) 98.3 F (36.8 C) 98.2 F (36.8 C)  TempSrc: Oral Oral Oral Oral  SpO2: 100% 100% 97% 98%  Weight:      Height:        Intake/Output Summary (Last 24 hours) at 02/23/2018 1224 Last data filed at 02/23/2018 1200 Gross per 24 hour  Intake 1560 ml  Output -  Net 1560 ml     Filed Weights   02/13/18 2030 02/14/18 0009  Weight: 82.1 kg (181 lb) 75.8 kg (167 lb 1.7 oz)    Examination:  General exam: Appears calm and comfortable  Respiratory system: Clear to auscultation. Respiratory effort normal. Cardiovascular system: S1 & S2 heard, RRR. No JVD, murmurs, rubs, gallops or clicks. No pedal edema. Gastrointestinal system: Abdomen is nondistended, soft and nontender. No organomegaly or masses felt. Normal bowel sounds heard. Central nervous system: Alert and oriented. No focal neurological deficits. Extremities: Symmetric 5 x 5 power. Skin: No rashes, lesions or ulcers Psychiatry: Judgement and insight appear normal. Mood & affect appropriate.     Data Reviewed: I have personally reviewed following labs and imaging studies  CBC: Recent Labs  Lab 02/17/18 0404 02/23/18 0352  WBC 10.0 4.9  HGB 10.3* 11.0*  HCT 31.6* 33.2*  MCV 90.0 88.5  PLT 199 413   Basic Metabolic Panel: Recent Labs  Lab 02/17/18 0404 02/20/18 0605  NA 137  --   K 4.0  --   CL 102  --   CO2 27  --   GLUCOSE 105*  --   BUN 32*  --   CREATININE 0.80 0.73  CALCIUM 8.2*  --    GFR: Estimated Creatinine Clearance: 73.7 mL/min (by C-G formula based on SCr of 0.73 mg/dL). Liver Function Tests: No results for input(s): AST, ALT, ALKPHOS, BILITOT, PROT, ALBUMIN in the last 168 hours. No results for input(s):  LIPASE, AMYLASE in the last 168 hours. No results for input(s): AMMONIA in the last 168 hours. Coagulation Profile: No results for input(s): INR, PROTIME in the last 168 hours. Cardiac Enzymes: No results for input(s): CKTOTAL, CKMB, CKMBINDEX, TROPONINI in the last 168 hours. BNP (last 3 results) No results for input(s): PROBNP in the last 8760 hours. HbA1C: No results for input(s): HGBA1C in the last 72 hours. CBG: No results for input(s): GLUCAP in the last 168 hours. Lipid Profile: No results for input(s): CHOL, HDL, LDLCALC, TRIG, CHOLHDL, LDLDIRECT in the  last 72 hours. Thyroid Function Tests: No results for input(s): TSH, T4TOTAL, FREET4, T3FREE, THYROIDAB in the last 72 hours. Anemia Panel: Recent Labs    02/23/18 0352  VITAMINB12 265  FOLATE 16.9  FERRITIN 53  TIBC 271  IRON 55  RETICCTPCT 1.4   Sepsis Labs: No results for input(s): PROCALCITON, LATICACIDVEN in the last 168 hours.  Recent Results (from the past 240 hour(s))  Culture, Urine     Status: None   Collection Time: 02/14/18 10:21 AM  Result Value Ref Range Status   Specimen Description   Final    URINE, RANDOM Performed at [facility] [address], [place], [place] [address]    Special Requests   Final    NONE Performed at [facility], [place] [address] [place], [place] [address]    Culture   Final    NO GROWTH Performed at [facility], [place] [address] [place], [place] [address]    Report Status 02/15/2018 FINAL  Final  Arthropod identification     Status: None   Collection Time: 02/15/18 12:45 PM  Result Value Ref Range Status   Parasite ID, Arthropod Comment  Final    Comment: (NOTE) Nonparasitic material received. Performed At: [facility] [place], [place] [id number] Rush Farmer MD [id number] Performed at [facility], [place] [address], [place], [place] [address]   MRSA PCR Screening     Status: None   Collection Time: 02/16/18  9:45 AM  Result Value Ref Range Status   MRSA by PCR NEGATIVE NEGATIVE Final    Comment:        The GeneXpert MRSA Assay (FDA approved for NASAL specimens only), is one component of a comprehensive MRSA colonization surveillance program. It is not intended to diagnose MRSA infection nor to guide or monitor treatment for MRSA infections. Performed at [facility], [place] [address], [place], [place] [address]          Radiology Studies: No results found.      Scheduled Meds: . doxycycline  100  mg Oral Q12H  . enoxaparin (LOVENOX) injection  40 mg Subcutaneous QHS  . hydrocerin   Topical BID  . pravastatin  10 mg Oral Daily   Continuous Infusions: . sodium chloride 75 mL/hr at 02/23/18 0031     LOS: 10 days     Georgette Shell, MD [facility]  If 7PM-7AM, please contact night-coverage [email or web address] Password [facility] 02/23/2018, 12:24 PM

## 2018-02-24 NOTE — Progress Notes (Signed)
PROGRESS NOTE    [name] [name]  [id number] DOB: April 26, 1931 DOA: 02/13/2018 PCP: Leanna Battles, MD  Brief Narrative:84 y.o.male with a history of dementia, hyperlipidemia. Patient presented with dyspnea and confusion. He was treated with oxygen and was weaned to room air. Unknown etiology for respiratory failure. He was found to have bilateral lower extremity cellulitis in addition to possible scabies, for which she was treated on 6/11.       Assessment & Plan:   Principal Problem:   Evaluation by psychiatric service required Active Problems:   Anxiety state   OBSTRUCTIVE SLEEP APNEA   GERD   Hypoxia   Edema   Hypoxemia   Dementia   Altered mental status  Acute respiratory failure with hypoxia Unknown etiology. Resolved   Dehydration Given IV fluids. Resolved.  Bilateral LE cellulitis -Doxycycline  Acute metabolic encephalopathy Improved per chart review and appears patient is back to baseline. Possibly secondary to infection  ?Scabies Patient treated with permethrin cream on 6/11. No concern for scabies on my examination  Normocytic anemia Stable.?GI bleeding. -FOBT -Iron panel -CBC in AM      DVT prophylaxis:lovenox Code Status: Code Family Communication no family available Disposition Plan:..  Family to pick him a Medicare choice for discharge with  Consultants: None  Procedures: None Antimicrobials: Has been on Doxy over 10 days will DC for now.  Subjective: Resting in bed in no acute distress no new complaints or events noted.   Objective: Vitals:   02/23/18 0535 02/23/18 1453 02/23/18 2100 02/24/18 0556  BP: 123/76 111/69 135/80 132/82  Pulse: 64 (!) 58 64 65  Resp: 16 20 16 16   Temp: 98.2 F (36.8 C) 98.2 F (36.8 C) 97.9 F (36.6 C) 97.7 F (36.5 C)  TempSrc: Oral Oral Oral Oral  SpO2: 98% 99% 100% 99%  Weight:      Height:        Intake/Output Summary (Last 24 hours) at 02/24/2018 1116 Last data filed  at 02/24/2018 0839 Gross per 24 hour  Intake 2040 ml  Output -  Net 2040 ml   Filed Weights   02/13/18 2030 02/14/18 0009  Weight: 82.1 kg (181 lb) 75.8 kg (167 lb 1.7 oz)    Examination:  General exam: Appears calm and comfortable  Respiratory system: Clear to auscultation. Respiratory effort normal. Cardiovascular system: S1 & S2 heard, RRR. No JVD, murmurs, rubs, gallops or clicks. No pedal edema. Gastrointestinal system: Abdomen is nondistended, soft and nontender. No organomegaly or masses felt. Normal bowel sounds heard. Central nervous system: Alert and oriented. No focal neurological deficits. Extremities: Symmetric 5 x 5 power. Skin: No rashes, lesions or ulcers Psychiatry: Judgement and insight appear normal. Mood & affect appropriate.     Data Reviewed: I have personally reviewed following labs and imaging studies  CBC: Recent Labs  Lab 02/23/18 0352  WBC 4.9  HGB 11.0*  HCT 33.2*  MCV 88.5  PLT 622   Basic Metabolic Panel: Recent Labs  Lab 02/20/18 0605  CREATININE 0.73   GFR: Estimated Creatinine Clearance: 73.7 mL/min (by C-G formula based on SCr of 0.73 mg/dL). Liver Function Tests: No results for input(s): AST, ALT, ALKPHOS, BILITOT, PROT, ALBUMIN in the last 168 hours. No results for input(s): LIPASE, AMYLASE in the last 168 hours. No results for input(s): AMMONIA in the last 168 hours. Coagulation Profile: No results for input(s): INR, PROTIME in the last 168 hours. Cardiac Enzymes: No results for input(s): CKTOTAL, CKMB, CKMBINDEX, TROPONINI in the last  168 hours. BNP (last 3 results) No results for input(s): PROBNP in the last 8760 hours. HbA1C: No results for input(s): HGBA1C in the last 72 hours. CBG: No results for input(s): GLUCAP in the last 168 hours. Lipid Profile: No results for input(s): CHOL, HDL, LDLCALC, TRIG, CHOLHDL, LDLDIRECT in the last 72 hours. Thyroid Function Tests: No results for input(s): TSH, T4TOTAL, FREET4,  T3FREE, THYROIDAB in the last 72 hours. Anemia Panel: Recent Labs    02/23/18 0352  VITAMINB12 265  FOLATE 16.9  FERRITIN 53  TIBC 271  IRON 55  RETICCTPCT 1.4   Sepsis Labs: No results for input(s): PROCALCITON, LATICACIDVEN in the last 168 hours.  Recent Results (from the past 240 hour(s))  Arthropod identification     Status: None   Collection Time: 02/15/18 12:45 PM  Result Value Ref Range Status   Parasite ID, Arthropod Comment  Final    Comment: (NOTE) Nonparasitic material received. Performed At: [facility] [place], [place] [id number] Rush Farmer MD [id number] Performed at [facility], [place] [address], [place], [place] [address]   MRSA PCR Screening     Status: None   Collection Time: 02/16/18  9:45 AM  Result Value Ref Range Status   MRSA by PCR NEGATIVE NEGATIVE Final    Comment:        The GeneXpert MRSA Assay (FDA approved for NASAL specimens only), is one component of a comprehensive MRSA colonization surveillance program. It is not intended to diagnose MRSA infection nor to guide or monitor treatment for MRSA infections. Performed at [facility], [place] [address] [place], [place] [address]          Radiology Studies: No results found.      Scheduled Meds: . doxycycline  100 mg Oral Q12H  . enoxaparin (LOVENOX) injection  40 mg Subcutaneous QHS  . hydrocerin   Topical BID  . pravastatin  10 mg Oral Daily   Continuous Infusions: . sodium chloride 75 mL/hr at 02/23/18 1317     LOS: 11 days      Georgette Shell, MD [facility] If 7PM-7AM, please contact night-coverage [email or web address] Password [facility] 02/24/2018, 11:16 AM

## 2018-02-25 NOTE — Progress Notes (Signed)
Physical Therapy Treatment Patient Details Name: [name] [name] MRN: [id number] DOB: 1931/03/18 Today's Date: 02/25/2018    History of Present Illness 84 year old with past medical history relevant for dementia, hyperlipidemia, possible mood disorder who was initially visiting his wife who was hospitalized for diarrhea.  She subsequently had a code event and was transferred to the ICU.  Patient was noted to be altered and quite confused and initially hypoxic.  Patient was admitted for altered mental status and hypoxia.    PT Comments    Pt ambulated 400' with min/guard assist for safety. Pt had mild loss of balance x 1 while impulsively reaching for an item outside his base of support while walking.   Follow Up Recommendations  Supervision/Assistance - 24 hour(ALF Memory Unit)     Equipment Recommendations  None recommended by PT    Recommendations for Other Services       Precautions / Restrictions Precautions Precautions: Fall Precaution Comments: Hx dementia Restrictions Weight Bearing Restrictions: No    Mobility  Bed Mobility               General bed mobility comments: OOB in recliner  Transfers Overall transfer level: Needs assistance Equipment used: None Transfers: Sit to/from Stand Sit to Stand: Supervision;Min guard         General transfer comment: min/guard for safety, impulsively reaches for items outside his base of support  Ambulation/Gait Ambulation/Gait assistance: Supervision;Min guard Gait Distance (Feet): 400 Feet Assistive device: None Gait Pattern/deviations: Step-through pattern;Decreased stride length;Drifts right/left;Trunk flexed;Shuffle Gait velocity: WFL   General Gait Details: able to amb well with no need for any AD and alternating gait.  Impaired safety cognition and envirnmental awareness makes him a FALL RISK.  Impulsively reached for items outside his base of support and had mild loss of balance x 1 requiring min  assist.    Stairs             Wheelchair Mobility    Modified Rankin (Stroke Patients Only)       Balance Overall balance assessment: Needs assistance Sitting-balance support: No upper extremity supported;Feet supported Sitting balance-Leahy Scale: Good       Standing balance-Leahy Scale: Fair Standing balance comment: no UE support in standing, but demonstrates gait deviations putting pt at higher risk for falls along wtih decreased focus and easily distracted                            Cognition Arousal/Alertness: Awake/alert Behavior During Therapy: Impulsive Overall Cognitive Status: No family/caregiver present to determine baseline cognitive functioning Area of Impairment: Orientation;Attention;Memory;Safety/judgement;Awareness;Problem solving                 Orientation Level: Place;Time;Situation             General Comments: talkative with rambling random conversation occassional switch to current but quickly back to random, can follow repeated one step commands      Exercises      General Comments        Pertinent Vitals/Pain Pain Assessment: Faces Faces Pain Scale: No hurt    Home Living                      Prior Function            PT Goals (current goals can now be found in the care plan section) Acute Rehab PT Goals PT Goal Formulation: Patient unable to participate in goal  setting Time For Goal Achievement: 02/28/18 Potential to Achieve Goals: Fair Progress towards PT goals: Progressing toward goals    Frequency    Min 3X/week      PT Plan Current plan remains appropriate    Co-evaluation              AM-PAC PT "6 Clicks" Daily Activity  Outcome Measure  Difficulty turning over in bed (including adjusting bedclothes, sheets and blankets)?: None Difficulty moving from lying on back to sitting on the side of the bed? : A Little Difficulty sitting down on and standing up from a chair  with arms (e.g., wheelchair, bedside commode, etc,.)?: A Little Help needed moving to and from a bed to chair (including a wheelchair)?: A Little Help needed walking in hospital room?: A Little Help needed climbing 3-5 steps with a railing? : A Little 6 Click Score: 19    End of Session Equipment Utilized During Treatment: Gait belt Activity Tolerance: Patient tolerated treatment well Patient left: in chair;with nursing/sitter in room;with call bell/phone within reach Nurse Communication: Mobility status PT Visit Diagnosis: Other abnormalities of gait and mobility (R26.89)     Time: 5749-3552 PT Time Calculation (min) (ACUTE ONLY): 14 min  Charges:  $Gait Training: 8-22 mins                    G Codes:          [name] [name] 02/25/2018, 12:12 PM [phone]

## 2018-02-25 NOTE — Progress Notes (Signed)
PROGRESS NOTE    [name] [name]  [id number]:[id number] DOB: 1931-09-07 DOA: 02/13/2018 PCP: Leanna Battles, MD Brief Narrative: 84 y.o.male with a history of dementia, hyperlipidemia. Patient presented with dyspnea and confusion. He was treated with oxygen and was weaned to room air. Unknown etiology for respiratory failure. He was found to have bilateral lower extremity cellulitis in addition to possible scabies, for which she was treated on 6/11.       Assessment & Plan:   Principal Problem:   Evaluation by psychiatric service required Active Problems:   Anxiety state   OBSTRUCTIVE SLEEP APNEA   GERD   Hypoxia   Edema   Hypoxemia   Dementia   Altered mental status  Acute respiratory failure with hypoxia Unknown etiology. Resolved   Dehydration Given IV fluids. Resolved.  Bilateral LE cellulitis -Doxycycline  Acute metabolic encephalopathy Improved per chart review and appears patient is back to baseline. Possibly secondary to infection  ?Scabies Patient treated with permethrin cream on 6/11. No concern for scabies on my examination  Normocytic anemia Stable.     DVT prophylaxis:lovenox Code Status:full Family Communication none Disposition Plan: Still awaiting memory care placement. Consultants:  None Procedures: None Antimicrobials: None  Subjective: Sitting up in chair in no acute distress he is he has IV fluids at 75 cc an hour   Objective: Vitals:   02/24/18 1401 02/24/18 2043 02/25/18 0350 02/25/18 1336  BP: 129/80 137/90 129/76 122/77  Pulse: 60 63 65 61  Resp: 16 14 18 14   Temp: 98.1 F (36.7 C) 98.1 F (36.7 C) 97.8 F (36.6 C) 97.8 F (36.6 C)  TempSrc: Oral Oral Oral Oral  SpO2: 100% 100% 98% 100%  Weight:      Height:        Intake/Output Summary (Last 24 hours) at 02/25/2018 1414 Last data filed at 02/25/2018 1150 Gross per 24 hour  Intake 1680 ml  Output -  Net 1680 ml   Filed Weights   02/13/18 2030  02/14/18 0009  Weight: 82.1 kg (181 lb) 75.8 kg (167 lb 1.7 oz)    Examination:  General exam: Appears calm and comfortable  Respiratory system: Clear to auscultation. Respiratory effort normal. Cardiovascular system: S1 & S2 heard, RRR. No JVD, murmurs, rubs, gallops or clicks. No pedal edema. Gastrointestinal system: Abdomen is nondistended, soft and nontender. No organomegaly or masses felt. Normal bowel sounds heard. Central nervous system: Alert and oriented. No focal neurological deficits. Extremities: Symmetric 5 x 5 power. Skin: No rashes, lesions or ulcers.     Data Reviewed: I have personally reviewed following labs and imaging studies  CBC: Recent Labs  Lab 02/23/18 0352  WBC 4.9  HGB 11.0*  HCT 33.2*  MCV 88.5  PLT 371   Basic Metabolic Panel: Recent Labs  Lab 02/20/18 0605  CREATININE 0.73   GFR: Estimated Creatinine Clearance: 73.7 mL/min (by C-G formula based on SCr of 0.73 mg/dL). Liver Function Tests: No results for input(s): AST, ALT, ALKPHOS, BILITOT, PROT, ALBUMIN in the last 168 hours. No results for input(s): LIPASE, AMYLASE in the last 168 hours. No results for input(s): AMMONIA in the last 168 hours. Coagulation Profile: No results for input(s): INR, PROTIME in the last 168 hours. Cardiac Enzymes: No results for input(s): CKTOTAL, CKMB, CKMBINDEX, TROPONINI in the last 168 hours. BNP (last 3 results) No results for input(s): PROBNP in the last 8760 hours. HbA1C: No results for input(s): HGBA1C in the last 72 hours. CBG: No results for input(s): GLUCAP  in the last 168 hours. Lipid Profile: No results for input(s): CHOL, HDL, LDLCALC, TRIG, CHOLHDL, LDLDIRECT in the last 72 hours. Thyroid Function Tests: No results for input(s): TSH, T4TOTAL, FREET4, T3FREE, THYROIDAB in the last 72 hours. Anemia Panel: Recent Labs    02/23/18 0352  VITAMINB12 265  FOLATE 16.9  FERRITIN 53  TIBC 271  IRON 55  RETICCTPCT 1.4   Sepsis Labs: No  results for input(s): PROCALCITON, LATICACIDVEN in the last 168 hours.  Recent Results (from the past 240 hour(s))  MRSA PCR Screening     Status: None   Collection Time: 02/16/18  9:45 AM  Result Value Ref Range Status   MRSA by PCR NEGATIVE NEGATIVE Final    Comment:        The GeneXpert MRSA Assay (FDA approved for NASAL specimens only), is one component of a comprehensive MRSA colonization surveillance program. It is not intended to diagnose MRSA infection nor to guide or monitor treatment for MRSA infections. Performed at [facility], [place] [address] [place], [place] [address]          Radiology Studies: No results found.      Scheduled Meds: . enoxaparin (LOVENOX) injection  40 mg Subcutaneous QHS  . hydrocerin   Topical BID  . pravastatin  10 mg Oral Daily   Continuous Infusions: . sodium chloride 75 mL/hr at 02/25/18 0139     LOS: 12 days     Georgette Shell, MD [facility] If 7PM-7AM, please contact night-coverage [email or web address] Password [facility] 02/25/2018, 2:14 PM

## 2018-02-26 NOTE — Progress Notes (Signed)
PROGRESS NOTE    [name] [name]  QJJ:[id number] DOB: 1931-01-24 DOA: 02/13/2018 PCP: Leanna Battles, MD  Brief Narrative:  84 y.o.male with a history of dementia, hyperlipidemia. Patient presented with dyspnea and confusion. He was treated with oxygen and was weaned to room air. Unknown etiology for respiratory failure. He was found to have bilateral lower extremity cellulitis in addition to possible scabies, for which she was treated on 6/11.    Assessment & Plan:   Principal Problem:   Evaluation by psychiatric service required Active Problems:   Anxiety state   OBSTRUCTIVE SLEEP APNEA   GERD   Hypoxia   Edema   Hypoxemia   Dementia   Altered mental status  Acute respiratory failure with hypoxia Unknown etiology. Resolved   Dehydration Given IV fluids. Resolved.  Bilateral LE cellulitis -Doxycycline  Acute metabolic encephalopathy Improved per chart review and appears patient is back to baseline. Possibly secondary to infection  ?Scabies Patient treated with permethrin cream on 6/11. No concern for scabies on my examination  Normocytic anemia Stable.      DVT prophylaxis: Lovenox Code Status full Family Communication: None Disposition Plan:  Waiting on placement  Consultants:  None  Procedures: None Antimicrobials: None  Subjective: Patient sitting up in chair in no acute distress, talking nonstop.   Objective: Vitals:   02/25/18 0350 02/25/18 1336 02/25/18 2122 02/26/18 0612  BP: 129/76 122/77 130/74 134/77  Pulse: 65 61 63 62  Resp: 18 14 20 16   Temp: 97.8 F (36.6 C) 97.8 F (36.6 C) 97.9 F (36.6 C) (!) 97.5 F (36.4 C)  TempSrc: Oral Oral Oral Oral  SpO2: 98% 100% 99% 97%  Weight:      Height:        Intake/Output Summary (Last 24 hours) at 02/26/2018 1054 Last data filed at 02/26/2018 0827 Gross per 24 hour  Intake 1800 ml  Output -  Net 1800 ml   Filed Weights   02/13/18 2030 02/14/18 0009  Weight: 82.1  kg (181 lb) 75.8 kg (167 lb 1.7 oz)    Examination:  General exam: Appears calm and comfortable  Respiratory system: Clear to auscultation. Respiratory effort normal. Cardiovascular system: S1 & S2 heard, RRR. No JVD, murmurs, rubs, gallops or clicks. No pedal edema. Gastrointestinal system: Abdomen is nondistended, soft and nontender. No organomegaly or masses felt. Normal bowel sounds heard. Central nervous system: Alert and oriented. No focal neurological deficits. Extremities: Symmetric 5 x 5 power. Skin: No rashes, lesions or ulcers    Data Reviewed: I have personally reviewed following labs and imaging studies  CBC: Recent Labs  Lab 02/23/18 0352  WBC 4.9  HGB 11.0*  HCT 33.2*  MCV 88.5  PLT 481   Basic Metabolic Panel: Recent Labs  Lab 02/20/18 0605  CREATININE 0.73   GFR: Estimated Creatinine Clearance: 73.7 mL/min (by C-G formula based on SCr of 0.73 mg/dL). Liver Function Tests: No results for input(s): AST, ALT, ALKPHOS, BILITOT, PROT, ALBUMIN in the last 168 hours. No results for input(s): LIPASE, AMYLASE in the last 168 hours. No results for input(s): AMMONIA in the last 168 hours. Coagulation Profile: No results for input(s): INR, PROTIME in the last 168 hours. Cardiac Enzymes: No results for input(s): CKTOTAL, CKMB, CKMBINDEX, TROPONINI in the last 168 hours. BNP (last 3 results) No results for input(s): PROBNP in the last 8760 hours. HbA1C: No results for input(s): HGBA1C in the last 72 hours. CBG: No results for input(s): GLUCAP in the last 168 hours.  Lipid Profile: No results for input(s): CHOL, HDL, LDLCALC, TRIG, CHOLHDL, LDLDIRECT in the last 72 hours. Thyroid Function Tests: No results for input(s): TSH, T4TOTAL, FREET4, T3FREE, THYROIDAB in the last 72 hours. Anemia Panel: No results for input(s): VITAMINB12, FOLATE, FERRITIN, TIBC, IRON, RETICCTPCT in the last 72 hours. Sepsis Labs: No results for input(s): PROCALCITON, LATICACIDVEN in  the last 168 hours.  No results found for this or any previous visit (from the past 240 hour(s)).       Radiology Studies: No results found.      Scheduled Meds: . enoxaparin (LOVENOX) injection  40 mg Subcutaneous QHS  . hydrocerin   Topical BID  . pravastatin  10 mg Oral Daily   Continuous Infusions:   LOS: 13 days      Georgette Shell, MD [facility] If 7PM-7AM, please contact night-coverage [email or web address] Password [facility] 02/26/2018, 10:54 AM

## 2018-02-27 LAB — CREATININE, SERUM: CREATININE: 0.84 mg/dL (ref 0.61–1.24)

## 2018-02-27 MED ORDER — HYDROCORTISONE 1 % EX CREA
1.0000 "application " | TOPICAL_CREAM | Freq: Three times a day (TID) | CUTANEOUS | Status: DC | PRN
  Filled 2018-02-27: qty 28

## 2018-02-27 NOTE — Progress Notes (Signed)
PROGRESS NOTE    Eula Mazzola  MHD:[id number] DOB: 24-Aug-1931 DOA: 02/13/2018 PCP: Leanna Battles, MD  Brief Narrative:84 y.o.male with a history of dementia, hyperlipidemia. Patient presented with dyspnea and confusion. He was treated with oxygen and was weaned to room air. Unknown etiology for respiratory failure. He was found to have bilateral lower extremity cellulitis in addition to possible scabies, for which she was treated on 6/11.     Assessment & Plan:   Principal Problem:   Evaluation by psychiatric service required Active Problems:   Anxiety state   OBSTRUCTIVE SLEEP APNEA   GERD   Hypoxia   Edema   Hypoxemia   Dementia   Altered mental status Acute respiratory failure with hypoxia Unknown etiology. Resolved   Dehydration Given IV fluids. Resolved.  Bilateral LE cellulitis -Doxycycline  Acute metabolic encephalopathy Improved per chart review and appears patient is back to baseline. Possibly secondary to infection  ?Scabies Patient treated with permethrin cream on 6/11. No concern for scabies on my examination  Normocytic anemia Stable.       DVT prophylaxis: Lovenox Code Status full code Family Communication: None Disposition Plan: Still waiting for memory care placement  Consultants: None none  Procedures: None Antimicrobials: None Subjective: Up in chair crying ate breakfast  Objective: Vitals:   02/26/18 0612 02/26/18 1324 02/26/18 2132 02/27/18 0621  BP: 134/77 126/82 121/61 97/62  Pulse: 62 (!) 59 62 98  Resp: 16 18 18 19   Temp: (!) 97.5 F (36.4 C) 98.1 F (36.7 C) 97.9 F (36.6 C) 98.6 F (37 C)  TempSrc: Oral Oral Oral Oral  SpO2: 97% 100% 98% 96%  Weight:      Height:        Intake/Output Summary (Last 24 hours) at 02/27/2018 1019 Last data filed at 02/26/2018 1900 Gross per 24 hour  Intake 720 ml  Output -  Net 720 ml   Filed Weights   02/13/18 2030 02/14/18 0009  Weight: 82.1 kg (181 lb)  75.8 kg (167 lb 1.7 oz)    Examination:  General exam: Appears calm and comfortable  Respiratory system: Clear to auscultation. Respiratory effort normal. Cardiovascular system: S1 & S2 heard, RRR. No JVD, murmurs, rubs, gallops or clicks. No pedal edema. Gastrointestinal system: Abdomen is nondistended, soft and nontender. No organomegaly or masses felt. Normal bowel sounds heard. Central nervous system: Alert and oriented. No focal neurological deficits. Extremities: Symmetric 5 x 5 power. Skin: No rashes, lesions or ulcers    Data Reviewed: I have personally reviewed following labs and imaging studies  CBC: Recent Labs  Lab 02/23/18 0352  WBC 4.9  HGB 11.0*  HCT 33.2*  MCV 88.5  PLT 211   Basic Metabolic Panel: Recent Labs  Lab 02/27/18 0427  CREATININE 0.84   GFR: Estimated Creatinine Clearance: 70.2 mL/min (by C-G formula based on SCr of 0.84 mg/dL). Liver Function Tests: No results for input(s): AST, ALT, ALKPHOS, BILITOT, PROT, ALBUMIN in the last 168 hours. No results for input(s): LIPASE, AMYLASE in the last 168 hours. No results for input(s): AMMONIA in the last 168 hours. Coagulation Profile: No results for input(s): INR, PROTIME in the last 168 hours. Cardiac Enzymes: No results for input(s): CKTOTAL, CKMB, CKMBINDEX, TROPONINI in the last 168 hours. BNP (last 3 results) No results for input(s): PROBNP in the last 8760 hours. HbA1C: No results for input(s): HGBA1C in the last 72 hours. CBG: No results for input(s): GLUCAP in the last 168 hours. Lipid Profile: No results  for input(s): CHOL, HDL, LDLCALC, TRIG, CHOLHDL, LDLDIRECT in the last 72 hours. Thyroid Function Tests: No results for input(s): TSH, T4TOTAL, FREET4, T3FREE, THYROIDAB in the last 72 hours. Anemia Panel: No results for input(s): VITAMINB12, FOLATE, FERRITIN, TIBC, IRON, RETICCTPCT in the last 72 hours. Sepsis Labs: No results for input(s): PROCALCITON, LATICACIDVEN in the last 168  hours.  No results found for this or any previous visit (from the past 240 hour(s)).       Radiology Studies: No results found.      Scheduled Meds: . enoxaparin (LOVENOX) injection  40 mg Subcutaneous QHS  . hydrocerin   Topical BID  . pravastatin  10 mg Oral Daily   Continuous Infusions:   LOS: 14 days     Georgette Shell, MD [facility] If 7PM-7AM, please contact night-coverage [email or web address] Password [facility] 02/27/2018, 10:19 AM

## 2018-02-28 NOTE — Care Management Important Message (Signed)
Important Message  Patient Details  Name: Taegen Delker MRN: [id number] Date of Birth: [birth year]   Medicare Important Message Given:  Yes    Kerin Salen 02/28/2018, 10:50 AMImportant Message  Patient Details  Name: Derryck Shahan MRN: [id number] Date of Birth: May 25, 1931   Medicare Important Message Given:  Yes    Kerin Salen 02/28/2018, 10:50 AM

## 2018-02-28 NOTE — Progress Notes (Signed)
Left voicemail for pt's POA to check status of their efforts to access finances in order to proceed with pt transitioning to memory care ALF. Will continue following.  Sharren Bridge, MSW, LCSW Clinical Social Work 02/28/2018 [phone]

## 2018-02-28 NOTE — Progress Notes (Signed)
PROGRESS NOTE    [name] [name]  [id number] DOB: [birth year] DOA: 02/13/2018 PCP: Leanna Battles, MD   Brief Narrative: :84 y.o.male with a history of dementia, hyperlipidemia. Patient presented with dyspnea and confusion. He was treated with oxygen and was weaned to room air. Unknown etiology for respiratory failure. He was found to have bilateral lower extremity cellulitis in addition to possible scabies, for which she was treated on 6/11.     Assessment & Plan:   Principal Problem:   Evaluation by psychiatric service required Active Problems:   Anxiety state   OBSTRUCTIVE SLEEP APNEA   GERD   Hypoxia   Edema   Hypoxemia   Dementia   Altered mental status  Acute respiratory failure with hypoxia Unknown etiology. Resolved   Dehydration Given IV fluids. Resolved.  Bilateral LE cellulitis -Doxycycline  Acute metabolic encephalopathy Improved per chart review and appears patient is back to baseline. Possibly secondary to infection  ?Scabies Patient treated with permethrin cream on 6/11. No concern for scabies on my examination  Normocytic anemia Stable.       DVT prophylaxis:lovenox Code Status:full Family Communication: none Disposition Plan: Waiting for placement Consultants: None   Procedures: None Antimicrobials none Subjective: Up in chair eating breakfast  Objective: Vitals:   02/26/18 1324 02/26/18 2132 02/27/18 0621 02/27/18 1403  BP: 126/82 121/61 97/62 100/62  Pulse: (!) 59 62 98 64  Resp: 18 18 19 18   Temp: 98.1 F (36.7 C) 97.9 F (36.6 C) 98.6 F (37 C) 98 F (36.7 C)  TempSrc: Oral Oral Oral Oral  SpO2: 100% 98% 96% 98%  Weight:      Height:        Intake/Output Summary (Last 24 hours) at 02/28/2018 1549 Last data filed at 02/28/2018 1300 Gross per 24 hour  Intake 750 ml  Output -  Net 750 ml   Filed Weights   02/13/18 2030 02/14/18 0009  Weight: 82.1 kg (181 lb) 75.8 kg (167 lb 1.7 oz)     Examination:  General exam: Appears calm and comfortable  Respiratory system: Clear to auscultation. Respiratory effort normal. Cardiovascular system: S1 & S2 heard, RRR. No JVD, murmurs, rubs, gallops or clicks. No pedal edema. Gastrointestinal system: Abdomen is nondistended, soft and nontender. No organomegaly or masses felt. Normal bowel sounds heard. Central nervous system: Alert and oriented. No focal neurological deficits. Extremities: Symmetric 5 x 5 power. Skin: No rashes, lesions or ulcers   Data Reviewed: I have personally reviewed following labs and imaging studies  CBC: Recent Labs  Lab 02/23/18 0352  WBC 4.9  HGB 11.0*  HCT 33.2*  MCV 88.5  PLT 237   Basic Metabolic Panel: Recent Labs  Lab 02/27/18 0427  CREATININE 0.84   GFR: Estimated Creatinine Clearance: 70.2 mL/min (by C-G formula based on SCr of 0.84 mg/dL). Liver Function Tests: No results for input(s): AST, ALT, ALKPHOS, BILITOT, PROT, ALBUMIN in the last 168 hours. No results for input(s): LIPASE, AMYLASE in the last 168 hours. No results for input(s): AMMONIA in the last 168 hours. Coagulation Profile: No results for input(s): INR, PROTIME in the last 168 hours. Cardiac Enzymes: No results for input(s): CKTOTAL, CKMB, CKMBINDEX, TROPONINI in the last 168 hours. BNP (last 3 results) No results for input(s): PROBNP in the last 8760 hours. HbA1C: No results for input(s): HGBA1C in the last 72 hours. CBG: No results for input(s): GLUCAP in the last 168 hours. Lipid Profile: No results for input(s): CHOL, HDL, LDLCALC, TRIG, CHOLHDL,  LDLDIRECT in the last 72 hours. Thyroid Function Tests: No results for input(s): TSH, T4TOTAL, FREET4, T3FREE, THYROIDAB in the last 72 hours. Anemia Panel: No results for input(s): VITAMINB12, FOLATE, FERRITIN, TIBC, IRON, RETICCTPCT in the last 72 hours. Sepsis Labs: No results for input(s): PROCALCITON, LATICACIDVEN in the last 168 hours.  No results found  for this or any previous visit (from the past 240 hour(s)).       Radiology Studies: No results found.      Scheduled Meds: . enoxaparin (LOVENOX) injection  40 mg Subcutaneous QHS  . hydrocerin   Topical BID  . pravastatin  10 mg Oral Daily   Continuous Infusions:   LOS: 15 days    Georgette Shell, MD [facility]  If 7PM-7AM, please contact night-coverage [email or web address] Password [facility] 02/28/2018, 3:49 PM

## 2018-02-28 NOTE — Progress Notes (Signed)
Physical Therapy Treatment Patient Details Name: [name] [name] MRN: [id number] DOB: 1931/04/10 Today's Date: 02/28/2018    History of Present Illness 84 year old with past medical history relevant for dementia, hyperlipidemia, possible mood disorder who was initially visiting his wife who was hospitalized for diarrhea.  She subsequently had a code event and was transferred to the ICU.  Patient was noted to be altered and quite confused and initially hypoxic.  Patient was admitted for altered mental status and hypoxia.    PT Comments    Pt cooperative with PT but extremely verbose, rambling during session, occasional redirection to current task; tolerated amb well with no overt LOB but remains falls risk; see below for details  Follow Up Recommendations  Other (comment);Supervision/Assistance - 24 hour(ALF/Memory Care)     Equipment Recommendations  None recommended by PT    Recommendations for Other Services       Precautions / Restrictions Precautions Precautions: Fall Precaution Comments: Hx dementia Restrictions Weight Bearing Restrictions: No    Mobility  Bed Mobility               General bed mobility comments: OOB in recliner  Transfers Overall transfer level: Needs assistance Equipment used: None Transfers: Sit to/from Stand Sit to Stand: Supervision;Min guard         General transfer comment: for safety  Ambulation/Gait Ambulation/Gait assistance: Supervision;Min guard Gait Distance (Feet): 400 Feet Assistive device: None Gait Pattern/deviations: Step-through pattern;Decreased stride length;Drifts right/left;Trunk flexed;Shuffle     General Gait Details: able to walk unassisted (close suprvision to min/guard for safety),  demonstrates gait deviations including crouch gait with forward head and slight anterior bias with decreased ankle DF and risk for falls, mildly unsteady but no overt LOB   Stairs             Wheelchair Mobility     Modified Rankin (Stroke Patients Only)       Balance   Sitting-balance support: No upper extremity supported;Feet supported Sitting balance-Leahy Scale: Good       Standing balance-Leahy Scale: Fair                              Cognition Arousal/Alertness: Awake/alert Behavior During Therapy: WFL for tasks assessed/performed(hyperverbal) Overall Cognitive Status: No family/caregiver present to determine baseline cognitive functioning(dementia hx) Area of Impairment: Following commands                 Orientation Level: Disoriented to;Place;Time;Situation     Following Commands: Follows multi-step commands inconsistently Safety/Judgement: Decreased awareness of safety;Decreased awareness of deficits     General Comments: pt very atalakative, rambling, had his lunch in front of him for >1hr but hadn't eaten anything d/t talking      Exercises      General Comments        Pertinent Vitals/Pain Pain Assessment: No/denies pain Faces Pain Scale: No hurt    Home Living                      Prior Function            PT Goals (current goals can now be found in the care plan section) Acute Rehab PT Goals PT Goal Formulation: Patient unable to participate in goal setting Time For Goal Achievement: 02/28/18 Potential to Achieve Goals: Fair Progress towards PT goals: Progressing toward goals    Frequency    Min 3X/week  PT Plan Current plan remains appropriate    Co-evaluation              AM-PAC PT "6 Clicks" Daily Activity  Outcome Measure  Difficulty turning over in bed (including adjusting bedclothes, sheets and blankets)?: None Difficulty moving from lying on back to sitting on the side of the bed? : A Little Difficulty sitting down on and standing up from a chair with arms (e.g., wheelchair, bedside commode, etc,.)?: A Little Help needed moving to and from a bed to chair (including a wheelchair)?: A  Little Help needed walking in hospital room?: A Little Help needed climbing 3-5 steps with a railing? : A Little 6 Click Score: 19    End of Session Equipment Utilized During Treatment: Gait belt Activity Tolerance: Patient tolerated treatment well Patient left: in chair;with nursing/sitter in room;with call bell/phone within reach Nurse Communication: Mobility status PT Visit Diagnosis: Other abnormalities of gait and mobility (R26.89)     Time: 1747-1595 PT Time Calculation (min) (ACUTE ONLY): 12 min  Charges:  $Gait Training: 8-22 mins                    G CodesKenyon Ana, PT Pager: [phone] 02/28/2018     Kenyon Ana 02/28/2018, 1:40 PM

## 2018-03-01 NOTE — Progress Notes (Signed)
PROGRESS NOTE    [name] [name]  TIR:[id number] DOB: Jan 30, 1931 DOA: 02/13/2018 PCP: Leanna Battles, MD   Brief Narrative:84 y.o.male with a history of dementia, hyperlipidemia. Patient presented with dyspnea and confusion. He was treated with oxygen and was weaned to room air. Unknown etiology for respiratory failure. He was found to have bilateral lower extremity cellulitis in addition to possible scabies, for which she was treated on 6/11.     Assessment & Plan:   Principal Problem:   Evaluation by psychiatric service required Active Problems:   Anxiety state   OBSTRUCTIVE SLEEP APNEA   GERD   Hypoxia   Edema   Hypoxemia   Dementia   Altered mental status   Acute respiratory failure with hypoxia Unknown etiology. Resolved   Dehydration Given IV fluids. Resolved.  Bilateral LE cellulitis -Doxycycline  Acute metabolic encephalopathy Improved per chart review and appears patient is back to baseline. Possibly secondary to infection  ?Scabies Patient treated with permethrin cream on 6/11. No concern for scabies on my examination  Normocytic anemia Stable.      DVT prophylaxis:LOVENOX Code Status:FULL Family Communication NONE Disposition Plan: Awaiting placement to memory care unit. Consultants: NONE   Procedures: NONE Antimicrobials:NONE  Subjective:NO NEW CHANGES OR COMPLAINTS   Objective: Vitals:   02/26/18 2132 02/27/18 0621 02/27/18 1403 03/01/18 0443  BP: 121/61 97/62 100/62 114/74  Pulse: 62 98 64 75  Resp: 18 19 18 16   Temp: 97.9 F (36.6 C) 98.6 F (37 C) 98 F (36.7 C) 98.3 F (36.8 C)  TempSrc: Oral Oral Oral Oral  SpO2: 98% 96% 98% 98%  Weight:      Height:        Intake/Output Summary (Last 24 hours) at 03/01/2018 1216 Last data filed at 02/28/2018 1854 Gross per 24 hour  Intake 800 ml  Output -  Net 800 ml   Filed Weights   02/13/18 2030 02/14/18 0009  Weight: 82.1 kg (181 lb) 75.8 kg (167 lb 1.7 oz)      Examination:  General exam: Appears calm and comfortable  Respiratory system: Clear to auscultation. Respiratory effort normal. Cardiovascular system: S1 & S2 heard, RRR. No JVD, murmurs, rubs, gallops or clicks. No pedal edema. Gastrointestinal system: Abdomen is nondistended, soft and nontender. No organomegaly or masses felt. Normal bowel sounds heard. Central nervous system: Alert and oriented. No focal neurological deficits. Extremities: Symmetric 5 x 5 power. Skin: No rashes, lesions     Data Reviewed: I have personally reviewed following labs and imaging studies  CBC: Recent Labs  Lab 02/23/18 0352  WBC 4.9  HGB 11.0*  HCT 33.2*  MCV 88.5  PLT 676   Basic Metabolic Panel: Recent Labs  Lab 02/27/18 0427  CREATININE 0.84   GFR: Estimated Creatinine Clearance: 70.2 mL/min (by C-G formula based on SCr of 0.84 mg/dL). Liver Function Tests: No results for input(s): AST, ALT, ALKPHOS, BILITOT, PROT, ALBUMIN in the last 168 hours. No results for input(s): LIPASE, AMYLASE in the last 168 hours. No results for input(s): AMMONIA in the last 168 hours. Coagulation Profile: No results for input(s): INR, PROTIME in the last 168 hours. Cardiac Enzymes: No results for input(s): CKTOTAL, CKMB, CKMBINDEX, TROPONINI in the last 168 hours. BNP (last 3 results) No results for input(s): PROBNP in the last 8760 hours. HbA1C: No results for input(s): HGBA1C in the last 72 hours. CBG: No results for input(s): GLUCAP in the last 168 hours. Lipid Profile: No results for input(s): CHOL, HDL, LDLCALC,  TRIG, CHOLHDL, LDLDIRECT in the last 72 hours. Thyroid Function Tests: No results for input(s): TSH, T4TOTAL, FREET4, T3FREE, THYROIDAB in the last 72 hours. Anemia Panel: No results for input(s): VITAMINB12, FOLATE, FERRITIN, TIBC, IRON, RETICCTPCT in the last 72 hours. Sepsis Labs: No results for input(s): PROCALCITON, LATICACIDVEN in the last 168 hours.  No results found for  this or any previous visit (from the past 240 hour(s)).       Radiology Studies: No results found.      Scheduled Meds: . enoxaparin (LOVENOX) injection  40 mg Subcutaneous QHS  . hydrocerin   Topical BID  . pravastatin  10 mg Oral Daily   Continuous Infusions:   LOS: 16 days     Georgette Shell, MD   If 7PM-7AM, please contact night-coverage [email or web address] Password [facility] 03/01/2018, 12:16 PM

## 2018-03-01 NOTE — Progress Notes (Signed)
Discussed plans with pt's POA [phone] and with [facility] (where pt has been offered admission).  POA explains she is working to gain copy of pt's wife's death certificate for bank records so that she can have access to pt's funds. Also working with family lawyer. CSW requesting if pt could admit to ALF prior to POA having full access to finances- ALF business office to review and discuss with POA if this could be option. CSW will continue following.  Sharren Bridge, MSW, LCSW Clinical Social Work 03/01/2018 [phone]

## 2018-03-01 NOTE — Progress Notes (Signed)
Nutrition Brief Note  Patient identified for LOS (day #16).  Wt Readings from Last 15 Encounters:  02/14/18 167 lb 1.7 oz (75.8 kg)  10/08/15 175 lb 7.8 oz (79.6 kg)  02/09/13 169 lb (76.7 kg)  04/28/12 175 lb (79.4 kg)  04/14/12 175 lb (79.4 kg)  12/30/11 180 lb 6.4 oz (81.8 kg)  06/24/11 183 lb 3.2 oz (83.1 kg)    Body mass index is 21.17 kg/m. Patient meets criteria for normal weight based on current BMI. Skin WDL. Per notes, patient is medically stable and is pending placement. He has hx of dementia and is currently noted to be a/o to self only.   Current diet order is Heart Healthy, patient is consuming approximately 100% of meals at this time.  Medications reviewed. Labs reviewed.  No nutrition interventions warranted at this time. If nutrition issues arise, please consult RD.      [name] Matin, MS, RD, LDN, [facility] Clinical Dietitian Pager # [phone] After hours/weekend pager # [phone]

## 2018-03-02 ENCOUNTER — Inpatient Hospital Stay (HOSPITAL_COMMUNITY): Payer: Medicare Other

## 2018-03-02 DIAGNOSIS — K219 Gastro-esophageal reflux disease without esophagitis: Secondary | ICD-10-CM

## 2018-03-02 DIAGNOSIS — M7022 Olecranon bursitis, left elbow: Secondary | ICD-10-CM

## 2018-03-02 MED ORDER — HYDROXYZINE HCL 10 MG PO TABS
10.0000 mg | ORAL_TABLET | Freq: Three times a day (TID) | ORAL | Status: DC | PRN
  Administered 2018-03-02 (×2): 10 mg via ORAL
  Filled 2018-03-02 (×3): qty 1

## 2018-03-02 NOTE — Progress Notes (Signed)
PROGRESS NOTE    [name] [name]  VQX:[id number] DOB: Aug 24, 1931 DOA: 02/13/2018 PCP: Leanna Battles, MD   Brief Narrative: [name] [name] is a 84 y.o. male with a history of dementia, hyperlipidemia.  Patient presented with dyspnea and confusion.  He was treated with oxygen and was weaned to room air.  Unknown etiology for respiratory failure.  He was found to have bilateral lower extremity cellulitis in addition to possible scabies, for which he was treated on 6/11.   Assessment & Plan:   Principal Problem:   Evaluation by psychiatric service required Active Problems:   Anxiety state   OBSTRUCTIVE SLEEP APNEA   GERD   Hypoxia   Edema   Hypoxemia   Dementia   Altered mental status   Acute respiratory failure with hypoxia Unknown etiology. Resolved   Dehydration Given IV fluids. Resolved.  Bilateral LE cellulitis Treated with doxycycline.  Acute metabolic encephalopathy Improved per chart review and appears patient is back to baseline. Possibly secondary to infection.  Scabies Patient treated with permethrin cream on 6/11. Resolved.  Normocytic anemia Stable. Fecal occult blood test negative.  Left forearm nodule ?lipoma. Non-tender -Ultrasound  Left olecranon bursitis No evidence of cellulitis or septic joint -Supportive care   DVT prophylaxis: Lovenox Code Status:   Code Status: Full Code Family Communication: None at bedside Disposition Plan: Discharge to ALF when bed is available and POA issues resolved   Consultants:   None  Procedures:   None  Antimicrobials:  Doxycycline    Subjective: No concerns voiced today  Objective: Vitals:   03/01/18 0443 03/01/18 1702 03/01/18 2155 03/02/18 0639  BP: 114/74 132/76 125/68 107/71  Pulse: 75 80 75 69  Resp: 16 18 16 16   Temp: 98.3 F (36.8 C) 98.8 F (37.1 C) 98.2 F (36.8 C) 98.8 F (37.1 C)  TempSrc: Oral Oral Oral Oral  SpO2: 98% 98% 98% 96%  Weight:      Height:         Intake/Output Summary (Last 24 hours) at 03/02/2018 1312 Last data filed at 03/02/2018 0815 Gross per 24 hour  Intake 720 ml  Output -  Net 720 ml   Filed Weights   02/13/18 2030 02/14/18 0009  Weight: 82.1 kg (181 lb) 75.8 kg (167 lb 1.7 oz)    Examination:  General exam: Appears calm and comfortable Respiratory system: Clear to auscultation. Respiratory effort normal. Cardiovascular system: S1 & S2 heard, RRR. No murmurs, rubs, gallops or clicks. Gastrointestinal system: Abdomen is nondistended, soft and nontender. No organomegaly or masses felt. Normal bowel sounds heard. Central nervous system: Alert and oriented. No focal neurological deficits. Extremities: Left ventral forearm nodule that is non-tender and non-fluctuant. Also has a significant olecranon bursa effusion that is non-tender and is without overlying erythema Skin: No cyanosis. No rashes Psychiatry: impaired judgement. Less tangential   Data Reviewed: I have personally reviewed following labs and imaging studies  CBC: No results for input(s): WBC, NEUTROABS, HGB, HCT, MCV, PLT in the last 168 hours. Basic Metabolic Panel: Recent Labs  Lab 02/27/18 0427  CREATININE 0.84   GFR: Estimated Creatinine Clearance: 70.2 mL/min (by C-G formula based on SCr of 0.84 mg/dL). Liver Function Tests: No results for input(s): AST, ALT, ALKPHOS, BILITOT, PROT, ALBUMIN in the last 168 hours. No results for input(s): LIPASE, AMYLASE in the last 168 hours. No results for input(s): AMMONIA in the last 168 hours. Coagulation Profile: No results for input(s): INR, PROTIME in the last 168 hours. Cardiac  Enzymes: No results for input(s): CKTOTAL, CKMB, CKMBINDEX, TROPONINI in the last 168 hours. BNP (last 3 results) No results for input(s): PROBNP in the last 8760 hours. HbA1C: No results for input(s): HGBA1C in the last 72 hours. CBG: No results for input(s): GLUCAP in the last 168 hours. Lipid Profile: No results for  input(s): CHOL, HDL, LDLCALC, TRIG, CHOLHDL, LDLDIRECT in the last 72 hours. Thyroid Function Tests: No results for input(s): TSH, T4TOTAL, FREET4, T3FREE, THYROIDAB in the last 72 hours. Anemia Panel: No results for input(s): VITAMINB12, FOLATE, FERRITIN, TIBC, IRON, RETICCTPCT in the last 72 hours. Sepsis Labs: No results for input(s): PROCALCITON, LATICACIDVEN in the last 168 hours.  No results found for this or any previous visit (from the past 240 hour(s)).       Radiology Studies: No results found.      Scheduled Meds: . enoxaparin (LOVENOX) injection  40 mg Subcutaneous QHS  . hydrocerin   Topical BID  . pravastatin  10 mg Oral Daily   Continuous Infusions:    LOS: 17 days     Cordelia Poche, MD [facility] 03/02/2018, 1:12 PM Pager: [phone]  If 7PM-7AM, please contact night-coverage [email or web address] 03/02/2018, 1:12 PM

## 2018-03-02 NOTE — Progress Notes (Signed)
Physical Therapy Treatment Patient Details Name: [name] [name] MRN: [id number] DOB: 13-Jun-1931 Today's Date: 03/02/2018    History of Present Illness 84 year old with past medical history relevant for dementia, hyperlipidemia, possible mood disorder who was initially visiting his wife who was hospitalized for diarrhea.  She subsequently had a code event and was transferred to the ICU.  Patient was noted to be altered and quite confused and initially hypoxic.  Patient was admitted for altered mental status and hypoxia.    PT Comments    Pt pleasant and cooperative but easily distracted.  Pt currently ambulating halls at supervision level with min instability and no LOB.  Discussed with nursing and nursing service will continue ambulating with pt - PT service to sign off at this time.   Follow Up Recommendations  Other (comment);Supervision/Assistance - 24 hour     Equipment Recommendations  None recommended by PT    Recommendations for Other Services       Precautions / Restrictions Precautions Precautions: Fall Precaution Comments: Hx dementia Restrictions Weight Bearing Restrictions: No    Mobility  Bed Mobility Overal bed mobility: Modified Independent                Transfers Overall transfer level: Modified independent Equipment used: None                Ambulation/Gait Ambulation/Gait assistance: Supervision;Min guard Gait Distance (Feet): 450 Feet Assistive device: None Gait Pattern/deviations: Step-through pattern;Decreased stride length;Drifts right/left;Trunk flexed;Shuffle Gait velocity: WFL   General Gait Details: able to walk unassisted (close suprvision to min/guard for safety),  demonstrates gait deviations including crouch gait with forward head and slight anterior bias with decreased ankle DF and risk for falls, mildly unsteady but no overt LOB   Stairs             Wheelchair Mobility    Modified Rankin (Stroke Patients  Only)       Balance Overall balance assessment: Needs assistance Sitting-balance support: No upper extremity supported;Feet supported Sitting balance-Leahy Scale: Good     Standing balance support: No upper extremity supported Standing balance-Leahy Scale: Good                              Cognition Arousal/Alertness: Awake/alert Behavior During Therapy: WFL for tasks assessed/performed Overall Cognitive Status: No family/caregiver present to determine baseline cognitive functioning Area of Impairment: Following commands                 Orientation Level: Disoriented to;Place;Time;Situation   Memory: Decreased short-term memory;Decreased recall of precautions Following Commands: Follows multi-step commands inconsistently Safety/Judgement: Decreased awareness of safety            Exercises      General Comments        Pertinent Vitals/Pain Pain Assessment: No/denies pain    Home Living                      Prior Function            PT Goals (current goals can now be found in the care plan section) Acute Rehab PT Goals PT Goal Formulation: Patient unable to participate in goal setting Time For Goal Achievement: 02/28/18 Potential to Achieve Goals: Fair Progress towards PT goals: Progressing toward goals    Frequency           PT Plan Current plan remains appropriate    Co-evaluation  AM-PAC PT "6 Clicks" Daily Activity  Outcome Measure  Difficulty turning over in bed (including adjusting bedclothes, sheets and blankets)?: None Difficulty moving from lying on back to sitting on the side of the bed? : None Difficulty sitting down on and standing up from a chair with arms (e.g., wheelchair, bedside commode, etc,.)?: None Help needed moving to and from a bed to chair (including a wheelchair)?: A Little Help needed walking in hospital room?: A Little Help needed climbing 3-5 steps with a railing? : A  Little 6 Click Score: 21    End of Session Equipment Utilized During Treatment: Gait belt Activity Tolerance: Patient tolerated treatment well Patient left: in chair;with nursing/sitter in room;with call bell/phone within reach Nurse Communication: Mobility status PT Visit Diagnosis: Other abnormalities of gait and mobility (R26.89)     Time: 8413-2440 PT Time Calculation (min) (ACUTE ONLY): 10 min  Charges:  $Gait Training: 8-22 mins                    G Codes:       Pg [phone]    Kyron Schlitt 03/02/2018, 12:20 PM

## 2018-03-03 DIAGNOSIS — I808 Phlebitis and thrombophlebitis of other sites: Secondary | ICD-10-CM

## 2018-03-03 MED ORDER — HYDROXYZINE HCL 25 MG PO TABS
25.0000 mg | ORAL_TABLET | Freq: Three times a day (TID) | ORAL | Status: DC | PRN
  Administered 2018-03-03 – 2018-03-08 (×7): 25 mg via ORAL
  Filled 2018-03-03 (×9): qty 1

## 2018-03-03 MED ORDER — TUBERCULIN PPD 5 UNIT/0.1ML ID SOLN
5.0000 [IU] | Freq: Once | INTRADERMAL | Status: AC
  Administered 2018-03-03: 5 [IU] via INTRADERMAL
  Filled 2018-03-03: qty 0.1

## 2018-03-03 NOTE — Progress Notes (Signed)
PPD placed on the RAFA, site marked with pen and covered with tagaderm.

## 2018-03-03 NOTE — Progress Notes (Signed)
PROGRESS NOTE    [name] [name]  FOY:[id number] DOB: 1931/03/09 DOA: 02/13/2018 PCP: Leanna Battles, MD   Brief Narrative: [name] [name] is a 84 y.o. male with a history of dementia, hyperlipidemia.  Patient presented with dyspnea and confusion.  He was treated with oxygen and was weaned to room air.  Unknown etiology for respiratory failure.  He was found to have bilateral lower extremity cellulitis in addition to possible scabies, for which he was treated on 6/11.   Assessment & Plan:   Principal Problem:   Evaluation by psychiatric service required Active Problems:   Anxiety state   OBSTRUCTIVE SLEEP APNEA   GERD   Hypoxia   Edema   Hypoxemia   Dementia   Altered mental status   Acute respiratory failure with hypoxia Unknown etiology. Resolved   Dehydration Given IV fluids. Resolved.  Bilateral LE cellulitis Treated with doxycycline.  Acute metabolic encephalopathy Improved per chart review and appears patient is back to baseline. Possibly secondary to infection.  Scabies Patient treated with permethrin cream on 6/11. Resolved.  Pruritis Likely secondary to recent scabies infection -Increase to Atarax 25 mg q6 hours prn  Normocytic anemia Stable. Fecal occult blood test negative.  Superficial thrombophlebitis Left forearm. Cephalic vein. asymptomatic  Left olecranon bursitis No evidence of cellulitis or septic joint -Supportive care   DVT prophylaxis: Lovenox Code Status:   Code Status: Full Code Family Communication: None at bedside Disposition Plan: Discharge to ALF when bed is available and POA issues resolved. PPD ordered.   Consultants:   None  Procedures:   None  Antimicrobials:  Doxycycline    Subjective: No issues.  Objective: Vitals:   03/01/18 2155 03/02/18 0639 03/02/18 2051 03/03/18 0611  BP: 125/68 107/71 108/66 125/78  Pulse: 75 69 84 (!) 43  Resp: 16 16 18 18   Temp: 98.2 F (36.8 C) 98.8 F (37.1  C) 98.4 F (36.9 C) 98 F (36.7 C)  TempSrc: Oral Oral Oral Oral  SpO2: 98% 96% 98% 96%  Weight:      Height:        Intake/Output Summary (Last 24 hours) at 03/03/2018 7672 Last data filed at 03/03/2018 0913 Gross per 24 hour  Intake 1200 ml  Output -  Net 1200 ml   Filed Weights   02/13/18 2030 02/14/18 0009  Weight: 82.1 kg (181 lb) 75.8 kg (167 lb 1.7 oz)    Examination:  General exam: Appears calm and comfortable Respiratory system: Respiratory effort normal. Neuro: alert, oriented to person Skin: multiple erythematous lesions/excoriations over right trunk Psychiatry: Judgement and insight appear impaired. Normal mood. Flat affect   Data Reviewed: I have personally reviewed following labs and imaging studies  CBC: No results for input(s): WBC, NEUTROABS, HGB, HCT, MCV, PLT in the last 168 hours. Basic Metabolic Panel: Recent Labs  Lab 02/27/18 0427  CREATININE 0.84   GFR: Estimated Creatinine Clearance: 70.2 mL/min (by C-G formula based on SCr of 0.84 mg/dL). Liver Function Tests: No results for input(s): AST, ALT, ALKPHOS, BILITOT, PROT, ALBUMIN in the last 168 hours. No results for input(s): LIPASE, AMYLASE in the last 168 hours. No results for input(s): AMMONIA in the last 168 hours. Coagulation Profile: No results for input(s): INR, PROTIME in the last 168 hours. Cardiac Enzymes: No results for input(s): CKTOTAL, CKMB, CKMBINDEX, TROPONINI in the last 168 hours. BNP (last 3 results) No results for input(s): PROBNP in the last 8760 hours. HbA1C: No results for input(s): HGBA1C in the last 72  hours. CBG: No results for input(s): GLUCAP in the last 168 hours. Lipid Profile: No results for input(s): CHOL, HDL, LDLCALC, TRIG, CHOLHDL, LDLDIRECT in the last 72 hours. Thyroid Function Tests: No results for input(s): TSH, T4TOTAL, FREET4, T3FREE, THYROIDAB in the last 72 hours. Anemia Panel: No results for input(s): VITAMINB12, FOLATE, FERRITIN, TIBC,  IRON, RETICCTPCT in the last 72 hours. Sepsis Labs: No results for input(s): PROCALCITON, LATICACIDVEN in the last 168 hours.  No results found for this or any previous visit (from the past 240 hour(s)).       Radiology Studies: [place] Soft Tissue Non Vascular  Result Date: 03/02/2018 CLINICAL DATA:  Palpable nodule in the left forearm. EXAM: ULTRASOUND LEFT UPPER EXTREMITY LIMITED TECHNIQUE: Ultrasound examination of the upper extremity soft tissues was performed in the area of clinical concern. COMPARISON:  None. FINDINGS: There is occlusive thrombus within the cephalic vein in the proximal forearm with surrounding soft tissue edema. No fluid collection or soft tissue mass. IMPRESSION: Palpable area of concern in the left forearm corresponds to superficial thrombophlebitis of the cephalic vein. Electronically Signed   By: Titus Dubin M.D.   On: 03/02/2018 19:58        Scheduled Meds: . enoxaparin (LOVENOX) injection  40 mg Subcutaneous QHS  . hydrocerin   Topical BID  . pravastatin  10 mg Oral Daily   Continuous Infusions:    LOS: 18 days     Cordelia Poche, MD [facility] 03/03/2018, 9:21 AM Pager: [phone]  If 7PM-7AM, please contact night-coverage [email or web address] 03/03/2018, 9:21 AM

## 2018-03-04 NOTE — Care Management Important Message (Signed)
Important Message  Patient Details  Name: [name] [name] MRN: [id number] Date of Birth: Jan 26, 1931   Medicare Important Message Given:  Yes    Kerin Salen 03/04/2018, 9:52 AMImportant Message  Patient Details  Name: [name] [name] MRN: [id number] Date of Birth: [birth year]   Medicare Important Message Given:  Yes    Kerin Salen 03/04/2018, 9:52 AM

## 2018-03-04 NOTE — Progress Notes (Addendum)
PROGRESS NOTE    [name] [name]  VHQ:[id number] DOB: [birth year] DOA: 02/13/2018 PCP: Leanna Battles, MD   Brief Narrative: [name] [name] is a 84 y.o. male with a history of dementia, hyperlipidemia.  Patient presented with dyspnea and confusion.  He was treated with oxygen and was weaned to room air.  Unknown etiology for respiratory failure.  He was found to have bilateral lower extremity cellulitis in addition to possible scabies, for which he was treated on 6/11.   Assessment & Plan:   Principal Problem:   Evaluation by psychiatric service required Active Problems:   Anxiety state   OBSTRUCTIVE SLEEP APNEA   GERD   Hypoxia   Edema   Hypoxemia   Dementia   Altered mental status   Acute respiratory failure with hypoxia Unknown etiology. Resolved   Dehydration Given IV fluids. Resolved.  Bilateral LE cellulitis Treated with doxycycline. Resolved.  Acute metabolic encephalopathy Improved per chart review and appears patient is back to baseline. Possibly secondary to infection.  Scabies Patient treated with permethrin cream on 6/11. Resolved.  Pruritis Likely secondary to recent scabies infection. Improved with Atarax -Continue Atarax 25 mg q6 hours prn  Normocytic anemia Stable. Fecal occult blood test negative.  Superficial thrombophlebitis Left forearm. Cephalic vein. asymptomatic  Left olecranon bursitis No evidence of cellulitis or septic joint -Supportive care  PPD placed Placed on 6/28   DVT prophylaxis: Lovenox Code Status:   Code Status: Full Code Family Communication: None at bedside Disposition Plan: Discharge to ALF when bed is available and POA issues resolved. PPD ordered.   Consultants:   None  Procedures:   None  Antimicrobials:  Doxycycline    Subjective: Afebrile. Itching improved.  Objective: Vitals:   03/03/18 0611 03/03/18 1353 03/03/18 2130 03/04/18 0611  BP: 125/78 114/69 118/62 119/77  Pulse:  (!) 43 63 68 (!) 54  Resp: 18 20 18 16   Temp: 98 F (36.7 C) 97.8 F (36.6 C) 98 F (36.7 C) (!) 97.5 F (36.4 C)  TempSrc: Oral Oral Oral Oral  SpO2: 96% 96% 98% 100%  Weight:      Height:        Intake/Output Summary (Last 24 hours) at 03/04/2018 0913 Last data filed at 03/04/2018 0200 Gross per 24 hour  Intake 720 ml  Output 200 ml  Net 520 ml   Filed Weights   02/13/18 2030 02/14/18 0009  Weight: 82.1 kg (181 lb) 75.8 kg (167 lb 1.7 oz)    Examination:  General: Well appearing, no distress   Data Reviewed: I have personally reviewed following labs and imaging studies  CBC: No results for input(s): WBC, NEUTROABS, HGB, HCT, MCV, PLT in the last 168 hours. Basic Metabolic Panel: Recent Labs  Lab 02/27/18 0427  CREATININE 0.84   GFR: Estimated Creatinine Clearance: 70.2 mL/min (by C-G formula based on SCr of 0.84 mg/dL). Liver Function Tests: No results for input(s): AST, ALT, ALKPHOS, BILITOT, PROT, ALBUMIN in the last 168 hours. No results for input(s): LIPASE, AMYLASE in the last 168 hours. No results for input(s): AMMONIA in the last 168 hours. Coagulation Profile: No results for input(s): INR, PROTIME in the last 168 hours. Cardiac Enzymes: No results for input(s): CKTOTAL, CKMB, CKMBINDEX, TROPONINI in the last 168 hours. BNP (last 3 results) No results for input(s): PROBNP in the last 8760 hours. HbA1C: No results for input(s): HGBA1C in the last 72 hours. CBG: No results for input(s): GLUCAP in the last 168 hours. Lipid Profile: No  results for input(s): CHOL, HDL, LDLCALC, TRIG, CHOLHDL, LDLDIRECT in the last 72 hours. Thyroid Function Tests: No results for input(s): TSH, T4TOTAL, FREET4, T3FREE, THYROIDAB in the last 72 hours. Anemia Panel: No results for input(s): VITAMINB12, FOLATE, FERRITIN, TIBC, IRON, RETICCTPCT in the last 72 hours. Sepsis Labs: No results for input(s): PROCALCITON, LATICACIDVEN in the last 168 hours.  No results found  for this or any previous visit (from the past 240 hour(s)).   Current medications:  Current Facility-Administered Medications:  .  acetaminophen (TYLENOL) tablet 650 mg, 650 mg, Oral, Q6H PRN, Bodenheimer, Charles A, NP, 650 mg at 02/24/18 1849 .  enoxaparin (LOVENOX) injection 40 mg, 40 mg, Subcutaneous, QHS, Garba, Mohammad L, MD, 40 mg at 03/03/18 2200 .  hydrocerin (EUCERIN) cream, , Topical, BID, Bertha Earwood A, MD .  hydrocortisone cream 1 % 1 application, 1 application, Topical, TID PRN, Georgette Shell, MD .  hydrOXYzine (ATARAX/VISTARIL) tablet 25 mg, 25 mg, Oral, TID PRN, Mariel Aloe, MD, 25 mg at 03/04/18 1047 .  pravastatin (PRAVACHOL) tablet 10 mg, 10 mg, Oral, Daily, Jonelle Sidle, Mohammad L, MD, 10 mg at 03/04/18 1047 .  tuberculin injection 5 Units, 5 Units, Intradermal, Once, Mariel Aloe, MD, 5 Units at 03/03/18 1018    Radiology Studies: [place] Soft Tissue Non Vascular  Result Date: 03/02/2018 CLINICAL DATA:  Palpable nodule in the left forearm. EXAM: ULTRASOUND LEFT UPPER EXTREMITY LIMITED TECHNIQUE: Ultrasound examination of the upper extremity soft tissues was performed in the area of clinical concern. COMPARISON:  None. FINDINGS: There is occlusive thrombus within the cephalic vein in the proximal forearm with surrounding soft tissue edema. No fluid collection or soft tissue mass. IMPRESSION: Palpable area of concern in the left forearm corresponds to superficial thrombophlebitis of the cephalic vein. Electronically Signed   By: Titus Dubin M.D.   On: 03/02/2018 19:58        Scheduled Meds: . enoxaparin (LOVENOX) injection  40 mg Subcutaneous QHS  . hydrocerin   Topical BID  . pravastatin  10 mg Oral Daily  . tuberculin  5 Units Intradermal Once   Continuous Infusions:    LOS: 19 days     Cordelia Poche, MD [facility] 03/04/2018, 9:13 AM Pager: [phone]  If 7PM-7AM, please contact  night-coverage [email or web address] 03/04/2018, 9:13 AM

## 2018-03-04 NOTE — Progress Notes (Signed)
Spoke with pt's POA [name] [phone]- [facility] working with POA to arrange pt to move in prior to [facility] having full access to pt's financial accounts. Meeting with [employer] today and will follow up with CSW.  Sharren Bridge, MSW, LCSW Clinical Social Work 03/04/2018 [phone]

## 2018-03-05 MED ORDER — ALUM & MAG HYDROXIDE-SIMETH 200-200-20 MG/5ML PO SUSP: 30.0000 mL | ORAL | Status: DC | PRN

## 2018-03-05 NOTE — Progress Notes (Signed)
PROGRESS NOTE    [name] [name]  WUJ:[id number] DOB: October 03, 1930 DOA: 02/13/2018 PCP: Leanna Battles, MD   Brief Narrative: [name] [name] is a 84 y.o. male with a history of dementia, hyperlipidemia.  Patient presented with dyspnea and confusion.  He was treated with oxygen and was weaned to room air.  Unknown etiology for respiratory failure.  He was found to have bilateral lower extremity cellulitis in addition to possible scabies, for which he was treated on 6/11.   Assessment & Plan:   Principal Problem:   Evaluation by psychiatric service required Active Problems:   Anxiety state   OBSTRUCTIVE SLEEP APNEA   GERD   Hypoxia   Edema   Hypoxemia   Dementia   Altered mental status   Acute respiratory failure with hypoxia Unknown etiology. Resolved   Dehydration Given IV fluids. Resolved.  Bilateral LE cellulitis Treated with doxycycline. Resolved.  Acute metabolic encephalopathy Improved per chart review and appears patient is back to baseline. Possibly secondary to infection.  Scabies Patient treated with permethrin cream on 6/11. Resolved.  Pruritis Likely secondary to recent scabies infection. Improved with Atarax -Continue Atarax 25 mg q6 hours prn  Normocytic anemia Stable. Fecal occult blood test negative.  Superficial thrombophlebitis Left forearm. Cephalic vein. asymptomatic  Left olecranon bursitis No evidence of cellulitis or septic joint. Stable -Supportive care  PPD placed Placed on 6/28. Right forearm   DVT prophylaxis: Lovenox Code Status:   Code Status: Full Code Family Communication: None at bedside Disposition Plan: Discharge to ALF when bed is available. PPD placed.   Consultants:   None  Procedures:   None  Antimicrobials:  Doxycycline    Subjective: No concerns today.  Objective: Vitals:   03/03/18 2130 03/04/18 0611 03/04/18 1412 03/05/18 0646  BP: 118/62 119/77 100/66 120/81  Pulse: 68 (!) 54 67  62  Resp: 18 16 16 18   Temp: 98 F (36.7 C) (!) 97.5 F (36.4 C) (!) 97.4 F (36.3 C) 98 F (36.7 C)  TempSrc: Oral Oral Oral Oral  SpO2: 98% 100% 100% 98%  Weight:      Height:        Intake/Output Summary (Last 24 hours) at 03/05/2018 0835 Last data filed at 03/04/2018 1700 Gross per 24 hour  Intake 840 ml  Output -  Net 840 ml   Filed Weights   02/13/18 2030 02/14/18 0009  Weight: 82.1 kg (181 lb) 75.8 kg (167 lb 1.7 oz)    Examination:  General: Well appearing, no distress Musculoskeletal: Effusion over left olecranon without overlying redness or associated tenderness   Data Reviewed: I have personally reviewed following labs and imaging studies  CBC: No results for input(s): WBC, NEUTROABS, HGB, HCT, MCV, PLT in the last 168 hours. Basic Metabolic Panel: Recent Labs  Lab 02/27/18 0427  CREATININE 0.84   GFR: Estimated Creatinine Clearance: 70.2 mL/min (by C-G formula based on SCr of 0.84 mg/dL). Liver Function Tests: No results for input(s): AST, ALT, ALKPHOS, BILITOT, PROT, ALBUMIN in the last 168 hours. No results for input(s): LIPASE, AMYLASE in the last 168 hours. No results for input(s): AMMONIA in the last 168 hours. Coagulation Profile: No results for input(s): INR, PROTIME in the last 168 hours. Cardiac Enzymes: No results for input(s): CKTOTAL, CKMB, CKMBINDEX, TROPONINI in the last 168 hours. BNP (last 3 results) No results for input(s): PROBNP in the last 8760 hours. HbA1C: No results for input(s): HGBA1C in the last 72 hours. CBG: No results for input(s):  GLUCAP in the last 168 hours. Lipid Profile: No results for input(s): CHOL, HDL, LDLCALC, TRIG, CHOLHDL, LDLDIRECT in the last 72 hours. Thyroid Function Tests: No results for input(s): TSH, T4TOTAL, FREET4, T3FREE, THYROIDAB in the last 72 hours. Anemia Panel: No results for input(s): VITAMINB12, FOLATE, FERRITIN, TIBC, IRON, RETICCTPCT in the last 72 hours. Sepsis Labs: No results for  input(s): PROCALCITON, LATICACIDVEN in the last 168 hours.  No results found for this or any previous visit (from the past 240 hour(s)).   Current medications:  Current Facility-Administered Medications:  .  acetaminophen (TYLENOL) tablet 650 mg, 650 mg, Oral, Q6H PRN, Bodenheimer, Charles A, NP, 650 mg at 02/24/18 1849 .  enoxaparin (LOVENOX) injection 40 mg, 40 mg, Subcutaneous, QHS, Garba, Mohammad L, MD, 40 mg at 03/04/18 2156 .  hydrocerin (EUCERIN) cream, , Topical, BID, Mariel Aloe, MD, 1 application at 97/74/14 2157 .  hydrocortisone cream 1 % 1 application, 1 application, Topical, TID PRN, Georgette Shell, MD .  hydrOXYzine (ATARAX/VISTARIL) tablet 25 mg, 25 mg, Oral, TID PRN, Mariel Aloe, MD, 25 mg at 03/04/18 2156 .  pravastatin (PRAVACHOL) tablet 10 mg, 10 mg, Oral, Daily, Jonelle Sidle, Mohammad L, MD, 10 mg at 03/04/18 1047 .  tuberculin injection 5 Units, 5 Units, Intradermal, Once, Mariel Aloe, MD, 5 Units at 03/03/18 1018    Radiology Studies: No results found.      Scheduled Meds: . enoxaparin (LOVENOX) injection  40 mg Subcutaneous QHS  . hydrocerin   Topical BID  . pravastatin  10 mg Oral Daily  . tuberculin  5 Units Intradermal Once   Continuous Infusions:    LOS: 20 days     Cordelia Poche, MD [facility] 03/05/2018, 8:35 AM Pager: [phone]  If 7PM-7AM, please contact night-coverage [email or web address] 03/05/2018, 8:35 AM

## 2018-03-06 LAB — CREATININE, SERUM
Creatinine, Ser: 0.77 mg/dL (ref 0.61–1.24)
GFR calc Af Amer: >60 mL/min (ref >60)
GFR calc non Af Amer: >60 mL/min (ref >60)

## 2018-03-06 NOTE — Progress Notes (Signed)
PROGRESS NOTE    [name] [name]  YJE:[id number] DOB: 11/20/1930 DOA: 02/13/2018 PCP: Leanna Battles, MD   Brief Narrative: [name] [name] is a 84 y.o. male with a history of dementia, hyperlipidemia.  Patient presented with dyspnea and confusion.  He was treated with oxygen and was weaned to room air.  Unknown etiology for respiratory failure.  He was found to have bilateral lower extremity cellulitis in addition to possible scabies, for which he was treated on 6/11.   Assessment & Plan:   Principal Problem:   Evaluation by psychiatric service required Active Problems:   Anxiety state   OBSTRUCTIVE SLEEP APNEA   GERD   Hypoxia   Edema   Hypoxemia   Dementia   Altered mental status   Acute respiratory failure with hypoxia Unknown etiology. Resolved   Dehydration Given IV fluids. Resolved.  Bilateral LE cellulitis Treated with doxycycline. Resolved.  Acute metabolic encephalopathy Improved per chart review and appears patient is back to baseline. Possibly secondary to infection.  Scabies Patient treated with permethrin cream on 6/11. Resolved.  Pruritis Likely secondary to recent scabies infection. Improved with Atarax -Continue Atarax 25 mg q6 hours prn  Normocytic anemia Stable. Fecal occult blood test negative.  Superficial thrombophlebitis Left forearm. Cephalic vein. asymptomatic  Left olecranon bursitis No evidence of cellulitis or septic joint. Stable -Supportive care  PPD placed Placed on 6/28. Right forearm   DVT prophylaxis: Lovenox Code Status:   Code Status: Full Code Family Communication: None at bedside Disposition Plan: Discharge to ALF when bed is available. PPD placed.   Consultants:   None  Procedures:   None  Antimicrobials:  Doxycycline    Subjective: Shower. No issues overnight  Objective: Vitals:   03/04/18 1412 03/05/18 0646 03/05/18 1345 03/06/18 0645  BP: 100/66 120/81 105/68 120/71  Pulse: 67  62 (!) 58 64  Resp: 16 18 16 16   Temp: (!) 97.4 F (36.3 C) 98 F (36.7 C) 97.6 F (36.4 C) 98.1 F (36.7 C)  TempSrc: Oral Oral Oral Oral  SpO2: 100% 98% 100% 99%  Weight:      Height:        Intake/Output Summary (Last 24 hours) at 03/06/2018 1042 Last data filed at 03/06/2018 0932 Gross per 24 hour  Intake 720 ml  Output -  Net 720 ml   Filed Weights   02/13/18 2030 02/14/18 0009  Weight: 82.1 kg (181 lb) 75.8 kg (167 lb 1.7 oz)    Examination:  General: Well appearing, no distress   Data Reviewed: I have personally reviewed following labs and imaging studies  CBC: No results for input(s): WBC, NEUTROABS, HGB, HCT, MCV, PLT in the last 168 hours. Basic Metabolic Panel: Recent Labs  Lab 03/06/18 0500  CREATININE 0.77   GFR: Estimated Creatinine Clearance: 73.7 mL/min (by C-G formula based on SCr of 0.77 mg/dL). Liver Function Tests: No results for input(s): AST, ALT, ALKPHOS, BILITOT, PROT, ALBUMIN in the last 168 hours. No results for input(s): LIPASE, AMYLASE in the last 168 hours. No results for input(s): AMMONIA in the last 168 hours. Coagulation Profile: No results for input(s): INR, PROTIME in the last 168 hours. Cardiac Enzymes: No results for input(s): CKTOTAL, CKMB, CKMBINDEX, TROPONINI in the last 168 hours. BNP (last 3 results) No results for input(s): PROBNP in the last 8760 hours. HbA1C: No results for input(s): HGBA1C in the last 72 hours. CBG: No results for input(s): GLUCAP in the last 168 hours. Lipid Profile: No results for  input(s): CHOL, HDL, LDLCALC, TRIG, CHOLHDL, LDLDIRECT in the last 72 hours. Thyroid Function Tests: No results for input(s): TSH, T4TOTAL, FREET4, T3FREE, THYROIDAB in the last 72 hours. Anemia Panel: No results for input(s): VITAMINB12, FOLATE, FERRITIN, TIBC, IRON, RETICCTPCT in the last 72 hours. Sepsis Labs: No results for input(s): PROCALCITON, LATICACIDVEN in the last 168 hours.  No results found for this or  any previous visit (from the past 240 hour(s)).   Current medications:  Current Facility-Administered Medications:  .  acetaminophen (TYLENOL) tablet 650 mg, 650 mg, Oral, Q6H PRN, Bodenheimer, Charles A, NP, 650 mg at 02/24/18 1849 .  alum & mag hydroxide-simeth (MAALOX/MYLANTA) 200-200-20 MG/5ML suspension 30 mL, 30 mL, Oral, Q4H PRN, Mariel Aloe, MD .  enoxaparin (LOVENOX) injection 40 mg, 40 mg, Subcutaneous, QHS, Garba, Mohammad L, MD, 40 mg at 03/05/18 2220 .  hydrocerin (EUCERIN) cream, , Topical, BID, Domenica Weightman A, MD .  hydrocortisone cream 1 % 1 application, 1 application, Topical, TID PRN, Georgette Shell, MD .  hydrOXYzine (ATARAX/VISTARIL) tablet 25 mg, 25 mg, Oral, TID PRN, Mariel Aloe, MD, 25 mg at 03/06/18 0104 .  pravastatin (PRAVACHOL) tablet 10 mg, 10 mg, Oral, Daily, Gala Romney L, MD, 10 mg at 03/06/18 8309    Radiology Studies: No results found.      Scheduled Meds: . enoxaparin (LOVENOX) injection  40 mg Subcutaneous QHS  . hydrocerin   Topical BID  . pravastatin  10 mg Oral Daily   Continuous Infusions:    LOS: 21 days     Cordelia Poche, MD [facility] 03/06/2018, 10:42 AM Pager: [phone]  If 7PM-7AM, please contact night-coverage [email or web address] 03/06/2018, 10:42 AM

## 2018-03-07 NOTE — Progress Notes (Signed)
PROGRESS NOTE    [name] [name]  NWG:[id number] DOB: [birth year] DOA: 02/13/2018 PCP: Leanna Battles, MD   Brief Narrative: [name] [name] is a 84 y.o. male with a history of dementia, hyperlipidemia.  Patient presented with dyspnea and confusion.  He was treated with oxygen and was weaned to room air.  Unknown etiology for respiratory failure.  He was found to have bilateral lower extremity cellulitis in addition to possible scabies, for which he was treated on 6/11.   Assessment & Plan:   Principal Problem:   Evaluation by psychiatric service required Active Problems:   Anxiety state   OBSTRUCTIVE SLEEP APNEA   GERD   Hypoxia   Edema   Hypoxemia   Dementia   Altered mental status   Acute respiratory failure with hypoxia Unknown etiology. Resolved   Dehydration Given IV fluids. Resolved.  Bilateral LE cellulitis Treated with doxycycline. Resolved.  Acute metabolic encephalopathy Improved per chart review and appears patient is back to baseline. Possibly secondary to infection.  Scabies Patient treated with permethrin cream on 6/11. Resolved.  Pruritis Likely secondary to recent scabies infection. Improved with Atarax -Continue Atarax 25 mg q6 hours prn  Normocytic anemia Stable. Fecal occult blood test negative.  Superficial thrombophlebitis Left forearm. Cephalic vein. asymptomatic  Left olecranon bursitis No evidence of cellulitis or septic joint. Stable -Supportive care  PPD placed Placed on 6/28. Right forearm. Reading pending.   DVT prophylaxis: Lovenox Code Status:   Code Status: Full Code Family Communication: None at bedside Disposition Plan: Discharge to ALF when bed is available. PPD placed.   Consultants:   None  Procedures:   None  Antimicrobials:  Doxycycline    Subjective: No issues.  Objective: Vitals:   03/06/18 0645 03/06/18 1353 03/06/18 2220 03/07/18 0416  BP: 120/71 118/75 119/67 104/65  Pulse: 64  (!) 59 97 (!) 57  Resp: 16 18 18 16   Temp: 98.1 F (36.7 C) 97.7 F (36.5 C) 98.4 F (36.9 C) 97.6 F (36.4 C)  TempSrc: Oral Oral Oral Oral  SpO2: 99% 99%  97%  Weight:      Height:        Intake/Output Summary (Last 24 hours) at 03/07/2018 1420 Last data filed at 03/07/2018 0900 Gross per 24 hour  Intake 600 ml  Output -  Net 600 ml   Filed Weights   02/13/18 2030 02/14/18 0009  Weight: 82.1 kg (181 lb) 75.8 kg (167 lb 1.7 oz)    Examination:  General: Well appearing, no distress Musculoskeletal: left olecranon bursitis without erythema or tenderness   Data Reviewed: I have personally reviewed following labs and imaging studies  CBC: No results for input(s): WBC, NEUTROABS, HGB, HCT, MCV, PLT in the last 168 hours. Basic Metabolic Panel: Recent Labs  Lab 03/06/18 0500  CREATININE 0.77   GFR: Estimated Creatinine Clearance: 73.7 mL/min (by C-G formula based on SCr of 0.77 mg/dL). Liver Function Tests: No results for input(s): AST, ALT, ALKPHOS, BILITOT, PROT, ALBUMIN in the last 168 hours. No results for input(s): LIPASE, AMYLASE in the last 168 hours. No results for input(s): AMMONIA in the last 168 hours. Coagulation Profile: No results for input(s): INR, PROTIME in the last 168 hours. Cardiac Enzymes: No results for input(s): CKTOTAL, CKMB, CKMBINDEX, TROPONINI in the last 168 hours. BNP (last 3 results) No results for input(s): PROBNP in the last 8760 hours. HbA1C: No results for input(s): HGBA1C in the last 72 hours. CBG: No results for input(s): GLUCAP in the  last 168 hours. Lipid Profile: No results for input(s): CHOL, HDL, LDLCALC, TRIG, CHOLHDL, LDLDIRECT in the last 72 hours. Thyroid Function Tests: No results for input(s): TSH, T4TOTAL, FREET4, T3FREE, THYROIDAB in the last 72 hours. Anemia Panel: No results for input(s): VITAMINB12, FOLATE, FERRITIN, TIBC, IRON, RETICCTPCT in the last 72 hours. Sepsis Labs: No results for input(s): PROCALCITON,  LATICACIDVEN in the last 168 hours.  No results found for this or any previous visit (from the past 240 hour(s)).   Current medications:  Current Facility-Administered Medications:  .  acetaminophen (TYLENOL) tablet 650 mg, 650 mg, Oral, Q6H PRN, Bodenheimer, Charles A, NP, 650 mg at 02/24/18 1849 .  alum & mag hydroxide-simeth (MAALOX/MYLANTA) 200-200-20 MG/5ML suspension 30 mL, 30 mL, Oral, Q4H PRN, Mariel Aloe, MD .  enoxaparin (LOVENOX) injection 40 mg, 40 mg, Subcutaneous, QHS, Garba, Mohammad L, MD, 40 mg at 03/06/18 2125 .  hydrocerin (EUCERIN) cream, , Topical, BID, Bianco Cange A, MD .  hydrocortisone cream 1 % 1 application, 1 application, Topical, TID PRN, Georgette Shell, MD .  hydrOXYzine (ATARAX/VISTARIL) tablet 25 mg, 25 mg, Oral, TID PRN, Mariel Aloe, MD, 25 mg at 03/07/18 0408 .  pravastatin (PRAVACHOL) tablet 10 mg, 10 mg, Oral, Daily, Jonelle Sidle, Mohammad L, MD, 10 mg at 03/07/18 1001    Radiology Studies: No results found.      Scheduled Meds: . enoxaparin (LOVENOX) injection  40 mg Subcutaneous QHS  . hydrocerin   Topical BID  . pravastatin  10 mg Oral Daily   Continuous Infusions:    LOS: 22 days     Cordelia Poche, MD [facility] 03/07/2018, 2:20 PM Pager: [phone]  If 7PM-7AM, please contact night-coverage [email or web address] 03/07/2018, 2:20 PM

## 2018-03-07 NOTE — Progress Notes (Signed)
CSW provided complete supplement form and provided FL2.  CSW updated patient POA [name] [phone].  Kathrin Greathouse, Marlinda Mike, MSW Clinical Social Worker  [phone] 03/07/2018  4:42 PM

## 2018-03-08 MED ORDER — HYDROXYZINE HCL 25 MG PO TABS: 25.0000 mg | ORAL_TABLET | Freq: Three times a day (TID) | ORAL | 0 refills | Status: DC | PRN

## 2018-03-08 MED ORDER — ACETAMINOPHEN 325 MG PO TABS: 650.0000 mg | ORAL_TABLET | Freq: Four times a day (QID) | ORAL | 0 refills | Status: DC | PRN

## 2018-03-08 MED ORDER — PRAVASTATIN SODIUM 10 MG PO TABS: 10.0000 mg | ORAL_TABLET | Freq: Every day | ORAL | 0 refills | Status: DC

## 2018-03-08 MED ORDER — VITAMIN D 50 MCG (2000 UT) PO TABS: 2000.0000 [IU] | ORAL_TABLET | Freq: Every day | ORAL | 0 refills | Status: DC

## 2018-03-08 NOTE — Discharge Summary (Signed)
Physician Discharge Summary  Loc Feinstein HAL:[id number] DOB: 1930-09-08 DOA: 02/13/2018  PCP: Leanna Battles, MD  Admit date: 02/13/2018 Discharge date: 03/08/2018  Admitted From: Home Disposition: ALF  Recommendations for Outpatient Follow-up:  1. Follow-up of olecranon bursitis  Home Health: None Equipment/Devices: None  Discharge Condition: Stable CODE STATUS: Full code Diet recommendation: Heart healthy   Brief/Interim Summary:  Admission HPI written by Elwyn Reach, MD   Chief Complaint: Altered mental status  HPI: [name] [name] is a 84 y.o. male with medical history significant of dementia, hypertension, obstructive sleep apnea, anxiety disorder who was in the hospital accompanying his wife that was admitted to the intensive care unit today with GI bleed.  She was intubated and currently on the vent.  While patient was in the hospital he was noted to be confused wandering around and not himself.  Staff brought patient to the emergency room where he was evaluated.  He was found to have oxygen saturation of 88% on room air.  Also evidence of possible cellulitis of the lower extremity but bilateral edema.  No prior cardiac history.  Patient was visibly confused and disoriented.  He is therefore being admitted to the hospital for work-up of acute delirium.  Also work-up for his hypoxemia.  Chest x-ray showed no evidence of pneumonia.  ED Course: Temperature is 9078.  Blood pressure 147/98 with a pulse of 83 and respiratory rate of 25 oxygen sat 88% on room air.  His ABG showed pH of 7.387 PCO2 50 and PO2 87.9 on 2 L.  His hemoglobin is 11.9 with normal white count BUN 21.  Chest x-ray showed no acute findings.  Head CT is without contrast is negative   Hospital course:  Acute respiratory failure with hypoxia Unknown etiology. Resolved   Dehydration Given IV fluids. Resolved.  Bilateral LE cellulitis Treated with doxycycline. Resolved.  Acute  metabolic encephalopathy Improved per chart review and appears patient is back to baseline. Possibly secondary to infection.  Scabies Patient treated with permethrin cream on 6/11. Resolved.  Pruritis Likely secondary to recent scabies infection. Improved with Atarax. Continue Atarax  Normocytic anemia Stable. Fecal occult blood test negative.  Superficial thrombophlebitis Left forearm. Cephalic vein. Asymptomatic.  Left olecranon bursitis No evidence of cellulitis or septic joint. Slightly increased effusion. Non-tender. Recommend close follow-up for possible need for outpatient drainage.  PPD placed Placed on 6/28. Right forearm. Reading negative.   Discharge Diagnoses:  Principal Problem:   Evaluation by psychiatric service required Active Problems:   Anxiety state   OBSTRUCTIVE SLEEP APNEA   GERD   Hypoxia   Edema   Hypoxemia   Dementia   Altered mental status    Discharge Instructions  Discharge Instructions    Increase activity slowly   Complete by:  As directed      Allergies as of 03/08/2018      Reactions   Codeine    Zantac [ranitidine]    Amoxicillin Rash   Penicillins Rash      Medication List    STOP taking these medications   clidinium-chlordiazePOXIDE 5-2.5 MG capsule Commonly known as:  LIBRAX   divalproex 125 MG capsule Commonly known as:  DEPAKOTE SPRINKLE   donepezil 5 MG tablet Commonly known as:  ARICEPT   flunisolide 29 MCG/ACT nasal spray Commonly known as:  NASAREL   ketorolac 0.5 % ophthalmic solution Commonly known as:  ACULAR   mometasone 0.1 % ointment Commonly known as:  ELOCON  omeprazole 20 MG capsule Commonly known as:  PRILOSEC   triamcinolone cream 0.1 % Commonly known as:  KENALOG     TAKE these medications   acetaminophen 325 MG tablet Commonly known as:  TYLENOL Take 2 tablets (650 mg total) by mouth every 6 (six) hours as needed for mild pain or headache.   hydrOXYzine 25 MG  tablet Commonly known as:  ATARAX/VISTARIL Take 1 tablet (25 mg total) by mouth 3 (three) times daily as needed for itching. What changed:    how much to take  how to take this  when to take this  reasons to take this  additional instructions   pravastatin 10 MG tablet Commonly known as:  PRAVACHOL Take 1 tablet (10 mg total) by mouth daily. Start taking on:  03/09/2018   Vitamin D 2000 units tablet Take 1 tablet (2,000 Units total) by mouth daily.       Allergies  Allergen Reactions  . Codeine   . Zantac [Ranitidine]   . Amoxicillin Rash  . Penicillins Rash    Consultations:  Psychiatry   Procedures/Studies: Dg Chest 2 View  Result Date: 02/13/2018 CLINICAL DATA:  Confusion, lower extremity edema. EXAM: CHEST - 2 VIEW COMPARISON:  None. FINDINGS: The heart size and mediastinal contours are within normal limits. The aorta is tortuous. Both lungs are clear. The visualized skeletal structures are unremarkable. IMPRESSION: No active cardiopulmonary disease. Electronically Signed   By: Abelardo Diesel M.D.   On: 02/13/2018 21:23   Ct Head Wo Contrast  Result Date: 02/14/2018 CLINICAL DATA:  Altered level of consciousness EXAM: CT HEAD WITHOUT CONTRAST TECHNIQUE: Contiguous axial images were obtained from the base of the skull through the vertex without intravenous contrast. COMPARISON:  None. FINDINGS: Brain: No evidence of acute infarction, hemorrhage, extra-axial collection, ventriculomegaly, or mass effect. Generalized cerebral atrophy more focally severe in bilateral parietal lobes. Periventricular white matter low attenuation likely secondary to microangiopathy. Vascular: Cerebrovascular atherosclerotic calcifications are noted. Skull: Negative for fracture or focal lesion. Sinuses/Orbits: Visualized portions of the orbits are unremarkable. Visualized portions of the paranasal sinuses and mastoid air cells are unremarkable. Other: None. IMPRESSION: No acute intracranial  pathology. Electronically Signed   By: Kathreen Devoid   On: 02/14/2018 11:42   [place] Lt Upper Extrem Ltd Soft Tissue Non Vascular  Result Date: 03/02/2018 CLINICAL DATA:  Palpable nodule in the left forearm. EXAM: ULTRASOUND LEFT UPPER EXTREMITY LIMITED TECHNIQUE: Ultrasound examination of the upper extremity soft tissues was performed in the area of clinical concern. COMPARISON:  None. FINDINGS: There is occlusive thrombus within the cephalic vein in the proximal forearm with surrounding soft tissue edema. No fluid collection or soft tissue mass. IMPRESSION: Palpable area of concern in the left forearm corresponds to superficial thrombophlebitis of the cephalic vein. Electronically Signed   By: Titus Dubin M.D.   On: 03/02/2018 19:58     Subjective: No concerns today  Discharge Exam: Vitals:   03/07/18 2156 03/08/18 0530  BP: 115/66 122/72  Pulse: 65 (!) 55  Resp: 20 20  Temp: 98.2 F (36.8 C) 98.1 F (36.7 C)  SpO2: 98% 100%   Vitals:   03/07/18 0416 03/07/18 1457 03/07/18 2156 03/08/18 0530  BP: 104/65 111/66 115/66 122/72  Pulse: (!) 57 69 65 (!) 55  Resp: 16 20 20 20   Temp: 97.6 F (36.4 C) 97.7 F (36.5 C) 98.2 F (36.8 C) 98.1 F (36.7 C)  TempSrc: Oral Oral Oral Oral  SpO2: 97% 99% 98% 100%  Weight:      Height:        General: Pt is alert, awake, not in acute distress Extremities: no edema, no cyanosis. Effusion over left olecranon bursa that is non-tender    The results of significant diagnostics from this hospitalization (including imaging, microbiology, ancillary and laboratory) are listed below for reference.     Microbiology: No results found for this or any previous visit (from the past 240 hour(s)).   Labs: BNP (last 3 results) Recent Labs    02/13/18 2041  BNP 38.4   Basic Metabolic Panel: Recent Labs  Lab 03/06/18 0500  CREATININE 0.77   Urinalysis    Component Value Date/Time   COLORURINE YELLOW 02/14/2018 1021   APPEARANCEUR CLEAR  02/14/2018 1021   LABSPEC 1.010 02/14/2018 1021   PHURINE 7.0 02/14/2018 1021   GLUCOSEU NEGATIVE 02/14/2018 1021   HGBUR NEGATIVE 02/14/2018 1021   BILIRUBINUR NEGATIVE 02/14/2018 1021   KETONESUR NEGATIVE 02/14/2018 1021   PROTEINUR NEGATIVE 02/14/2018 1021   NITRITE NEGATIVE 02/14/2018 1021   LEUKOCYTESUR NEGATIVE 02/14/2018 1021    SIGNED:   Cordelia Poche, MD [facility] 03/08/2018, 11:06 AM

## 2018-03-08 NOTE — Clinical Social Work Placement (Addendum)
D/C Summary sent. Nurse given number to call report.  All information  received by facility liaison Domin[place]. Patient family will transport.    CLINICAL SOCIAL WORK PLACEMENT  NOTE  Date:  03/08/2018  Patient Details  Name: [name] [name] MRN: [id number] Date of Birth: 1931/02/11  Clinical Social Work is seeking post-discharge placement for this patient at the [place] level of care (*CSW will initial, date and re-position this form in  chart as items are completed):  No   Patient/family provided with [facility] Work Department's list of facilities offering this level of care within the geographic area requested by the patient (or if unable, by the patient's family).  No   Patient/family informed of their freedom to choose among providers that offer the needed level of care, that participate in Medicare, Medicaid or managed care program needed by the patient, have an available bed and are willing to accept the patient.  No   Patient/family informed of [facility]'s ownership interest in [facility] and [facility], as well as of the fact that they are under no obligation to receive care at these facilities.  PASRR submitted to EDS on       PASRR number received on       Existing PASRR number confirmed on       FL2 transmitted to all facilities in geographic area requested by pt/family on       FL2 transmitted to all facilities within larger geographic area on       Patient informed that his/her managed care company has contracts with or will negotiate with certain facilities, including the following:        Yes   Patient/family informed of bed offers received.  Patient chooses bed at   [facility]   Physician recommends and patient chooses bed at    [facility]  Patient to be transferred to   on 03/08/18.  Patient to be transferred to facility by Personal Vehicle.      Patient family notified on  03/08/18 of transfer.  Name of family member notified:  POA-[name]      PHYSICIAN Please prepare priority discharge summary, including medications     Additional Comment:    _______________________________________________ Lia Hopping, LCSW 03/08/2018, 10:16 AM

## 2018-03-17 DIAGNOSIS — M79676 Pain in unspecified toe(s): Secondary | ICD-10-CM | POA: Diagnosis not present

## 2018-03-17 DIAGNOSIS — M79675 Pain in left toe(s): Secondary | ICD-10-CM | POA: Diagnosis not present

## 2018-03-17 DIAGNOSIS — M7022 Olecranon bursitis, left elbow: Secondary | ICD-10-CM | POA: Diagnosis not present

## 2018-03-17 DIAGNOSIS — I10 Essential (primary) hypertension: Secondary | ICD-10-CM | POA: Diagnosis not present

## 2018-03-17 DIAGNOSIS — Z6820 Body mass index (BMI) 20.0-20.9, adult: Secondary | ICD-10-CM | POA: Diagnosis not present

## 2018-03-17 DIAGNOSIS — I8310 Varicose veins of unspecified lower extremity with inflammation: Secondary | ICD-10-CM | POA: Diagnosis not present

## 2018-03-17 DIAGNOSIS — R600 Localized edema: Secondary | ICD-10-CM | POA: Diagnosis not present

## 2018-03-30 DIAGNOSIS — M25522 Pain in left elbow: Secondary | ICD-10-CM | POA: Diagnosis not present

## 2018-03-30 DIAGNOSIS — M7022 Olecranon bursitis, left elbow: Secondary | ICD-10-CM | POA: Diagnosis not present

## 2018-03-31 DIAGNOSIS — L97529 Non-pressure chronic ulcer of other part of left foot with unspecified severity: Secondary | ICD-10-CM | POA: Diagnosis not present

## 2018-03-31 DIAGNOSIS — M79675 Pain in left toe(s): Secondary | ICD-10-CM | POA: Diagnosis not present

## 2018-03-31 DIAGNOSIS — Z6820 Body mass index (BMI) 20.0-20.9, adult: Secondary | ICD-10-CM | POA: Diagnosis not present

## 2018-04-06 ENCOUNTER — Ambulatory Visit (INDEPENDENT_AMBULATORY_CARE_PROVIDER_SITE_OTHER): Payer: Self-pay

## 2018-04-06 ENCOUNTER — Encounter (INDEPENDENT_AMBULATORY_CARE_PROVIDER_SITE_OTHER): Payer: Self-pay | Admitting: Family

## 2018-04-06 ENCOUNTER — Ambulatory Visit (INDEPENDENT_AMBULATORY_CARE_PROVIDER_SITE_OTHER): Payer: Medicare Other | Admitting: Family

## 2018-04-06 DIAGNOSIS — M79675 Pain in left toe(s): Secondary | ICD-10-CM

## 2018-04-06 MED ORDER — DOXYCYCLINE HYCLATE 100 MG PO TABS: 100.0000 mg | ORAL_TABLET | Freq: Two times a day (BID) | ORAL | 0 refills | Status: DC

## 2018-04-07 ENCOUNTER — Telehealth (INDEPENDENT_AMBULATORY_CARE_PROVIDER_SITE_OTHER): Payer: Self-pay | Admitting: Orthopedic Surgery

## 2018-04-07 DIAGNOSIS — R41841 Cognitive communication deficit: Secondary | ICD-10-CM | POA: Diagnosis not present

## 2018-04-07 NOTE — Telephone Encounter (Signed)
Devonya from [facility] [place] called stating that the patient was seen in the office yesterday and was prescribed Doxycycline, but the patient has been taking it since July 26, do you want them to continue with this medication?  CB#[phone].  Thank you.

## 2018-04-07 NOTE — Telephone Encounter (Signed)
Called and sw nurse she asked if the pt was to continue with the ABX that he was on that would last anohter 4 days or to take that and the continue with the order written yesterday  For 14 days. Advised to fax this to the facility [phone]

## 2018-04-08 NOTE — Telephone Encounter (Signed)
This was done.

## 2018-04-12 DIAGNOSIS — R41841 Cognitive communication deficit: Secondary | ICD-10-CM | POA: Diagnosis not present

## 2018-04-13 ENCOUNTER — Telehealth (INDEPENDENT_AMBULATORY_CARE_PROVIDER_SITE_OTHER): Payer: Self-pay | Admitting: Orthopedic Surgery

## 2018-04-13 NOTE — Telephone Encounter (Signed)
Please sign

## 2018-04-13 NOTE — Telephone Encounter (Signed)
[name] [name] from [facility] [place] called stating that she needs [name] to sign the fax that was sent over yesterday with the dates 04/13/18-04/26/18.  CB#[phone].  Thank you

## 2018-04-14 DIAGNOSIS — R41841 Cognitive communication deficit: Secondary | ICD-10-CM | POA: Diagnosis not present

## 2018-04-19 ENCOUNTER — Ambulatory Visit (INDEPENDENT_AMBULATORY_CARE_PROVIDER_SITE_OTHER): Payer: Self-pay

## 2018-04-19 ENCOUNTER — Ambulatory Visit (INDEPENDENT_AMBULATORY_CARE_PROVIDER_SITE_OTHER): Payer: Medicare Other | Admitting: Orthopedic Surgery

## 2018-04-19 DIAGNOSIS — M7022 Olecranon bursitis, left elbow: Secondary | ICD-10-CM | POA: Diagnosis not present

## 2018-04-19 DIAGNOSIS — M869 Osteomyelitis, unspecified: Secondary | ICD-10-CM

## 2018-04-19 DIAGNOSIS — M79675 Pain in left toe(s): Secondary | ICD-10-CM

## 2018-04-19 DIAGNOSIS — R41841 Cognitive communication deficit: Secondary | ICD-10-CM | POA: Diagnosis not present

## 2018-04-20 DIAGNOSIS — R41841 Cognitive communication deficit: Secondary | ICD-10-CM | POA: Diagnosis not present

## 2018-04-22 DIAGNOSIS — I10 Essential (primary) hypertension: Secondary | ICD-10-CM | POA: Diagnosis not present

## 2018-04-22 DIAGNOSIS — L89890 Pressure ulcer of other site, unstageable: Secondary | ICD-10-CM | POA: Diagnosis not present

## 2018-04-22 DIAGNOSIS — F419 Anxiety disorder, unspecified: Secondary | ICD-10-CM | POA: Diagnosis not present

## 2018-04-22 DIAGNOSIS — Z86010 Personal history of colonic polyps: Secondary | ICD-10-CM | POA: Diagnosis not present

## 2018-04-22 DIAGNOSIS — F0390 Unspecified dementia without behavioral disturbance: Secondary | ICD-10-CM | POA: Diagnosis not present

## 2018-04-22 DIAGNOSIS — F411 Generalized anxiety disorder: Secondary | ICD-10-CM | POA: Diagnosis not present

## 2018-04-22 DIAGNOSIS — G4733 Obstructive sleep apnea (adult) (pediatric): Secondary | ICD-10-CM | POA: Diagnosis not present

## 2018-04-22 DIAGNOSIS — M1991 Primary osteoarthritis, unspecified site: Secondary | ICD-10-CM | POA: Diagnosis not present

## 2018-04-22 DIAGNOSIS — F329 Major depressive disorder, single episode, unspecified: Secondary | ICD-10-CM | POA: Diagnosis not present

## 2018-04-22 DIAGNOSIS — R600 Localized edema: Secondary | ICD-10-CM | POA: Diagnosis not present

## 2018-04-25 ENCOUNTER — Encounter (INDEPENDENT_AMBULATORY_CARE_PROVIDER_SITE_OTHER): Payer: Self-pay | Admitting: Orthopedic Surgery

## 2018-04-25 DIAGNOSIS — M7022 Olecranon bursitis, left elbow: Secondary | ICD-10-CM | POA: Diagnosis not present

## 2018-04-25 DIAGNOSIS — M79675 Pain in left toe(s): Secondary | ICD-10-CM | POA: Diagnosis not present

## 2018-04-25 DIAGNOSIS — M869 Osteomyelitis, unspecified: Secondary | ICD-10-CM | POA: Diagnosis not present

## 2018-04-25 DIAGNOSIS — F0390 Unspecified dementia without behavioral disturbance: Secondary | ICD-10-CM | POA: Diagnosis not present

## 2018-04-25 DIAGNOSIS — R600 Localized edema: Secondary | ICD-10-CM | POA: Diagnosis not present

## 2018-04-25 DIAGNOSIS — M8610 Other acute osteomyelitis, unspecified site: Secondary | ICD-10-CM | POA: Diagnosis not present

## 2018-04-25 DIAGNOSIS — E7849 Other hyperlipidemia: Secondary | ICD-10-CM | POA: Diagnosis not present

## 2018-04-25 MED ORDER — METHYLPREDNISOLONE ACETATE 40 MG/ML IJ SUSP
40.0000 mg | INTRAMUSCULAR | Status: AC | PRN
  Administered 2018-04-25: 40 mg via INTRA_ARTICULAR

## 2018-04-25 MED ORDER — LIDOCAINE HCL 1 % IJ SOLN
2.0000 mL | INTRAMUSCULAR | Status: AC | PRN
  Administered 2018-04-25: 2 mL

## 2018-04-25 NOTE — Progress Notes (Signed)
Office Visit Note   Patient: [name] [name]           Date of Birth: Jul 17, 1931           MRN: [id number] Visit Date: 04/19/2018              Requested by: Leanna Battles, [address], [place] [address] PCP: Leanna Battles, MD  Chief Complaint  Patient presents with  . Left Great Toe - Follow-up      HPI: Patient is a 84 year old gentleman who presents with 2 separate issues #1 large olecranon bursitis left elbow without cellulitis.  Patient states he has had recurrent episodes of the bursitis and the fluid has returned.  Patient denies any pain.  Patient also has sausage digit swelling of the left great toe with a open ulcer.  Assessment & Plan: Visit Diagnoses:  1. Great toe pain, left   2. Osteomyelitis of great toe of left foot ([place])   3. Olecranon bursitis of left elbow     Plan: The elbow was aspirated without complications no signs of infection.  Patient does have osteomyelitis of the left great toe discussed that ideal treatment would be an amputation of the great toe through the MTP joint.  Patient would like to follow-up as needed if he wishes to proceed with surgery or if the condition worsens to the great toe.  Follow-Up Instructions: Return in about 2 weeks (around 05/03/2018).   Ortho Exam  Patient is alert, oriented, no adenopathy, well-dressed, normal affect, normal respiratory effort. Examination patient has a good dorsalis pedis pulse he has sausage digit swelling with cellulitis of the left great toe.  The ulcer was probed with a silver nitrate stick and this probes down to bone with destructive bony changes.  Patient also has a large swelling of the left elbow.  After informed consent this was aspirated of clear serosanguineous fluid no purulence no signs of infection.  A compression wrap was applied.  Imaging: No results found. No images are attached to the encounter.  Labs: Lab Results  Component Value Date   REPTSTATUS 02/15/2018  FINAL 02/14/2018   CULT  02/14/2018    NO GROWTH Performed at [facility] [address], [place], [place] [address]      Lab Results  Component Value Date   ALBUMIN 2.7 (L) 02/14/2018   ALBUMIN 3.5 02/13/2018    There is no height or weight on file to calculate BMI.  Orders:  Orders Placed This Encounter  Procedures  . XR Toe Great Left   No orders of the defined types were placed in this encounter.    Procedures: Medium Joint Inj: L olecranon bursa on 04/25/2018 2:03 PM Indications: pain and diagnostic evaluation Details: 22 G 1.5 in needle, posterior approach Medications: 2 mL lidocaine 1 %; 40 mg methylPREDNISolone acetate 40 MG/ML Aspirate: 20 mL bloody Outcome: tolerated well, no immediate complications Procedure, treatment alternatives, risks and benefits explained, specific risks discussed. Consent was given by the patient. Immediately prior to procedure a time out was called to verify the correct patient, procedure, equipment, support staff and site/side marked as required. Patient was prepped and draped in the usual sterile fashion.      Clinical Data: No additional findings.  ROS:  All other systems negative, except as noted in the HPI. Review of Systems  Objective: Vital Signs: There were no vitals taken for this visit.  Specialty Comments:  No specialty comments available.  [place]  History: Patient Active Problem List   Diagnosis Date Noted  . Evaluation by psychiatric service required   . Hypoxia 02/13/2018  . Edema 02/13/2018  . Hypoxemia 02/13/2018  . Dementia 02/13/2018  . Altered mental status 02/13/2018  . Dermatitis 10/08/2015  . CHEST PAIN, ATYPICAL 04/11/2010  . Personal history of colon polyps and family history of colon cancer 12/19/2007  . Anxiety state 12/19/2007  . DEPRESSION 12/19/2007  . OBSTRUCTIVE SLEEP APNEA 12/19/2007  . ALLERGIC CONJUNCTIVITIS 12/19/2007  . Seasonal allergic rhinitis 12/19/2007  . GERD 12/19/2007   . IBS 12/19/2007   Past Medical History:  Diagnosis Date  . Allergic rhinitis, cause unspecified   . Anxiety state, unspecified   . Arthritis   . Benign neoplasm of colon   . Depressive disorder, not elsewhere classified   . Esophageal reflux   . Hyperlipidemia   . Irritable bowel syndrome   . Obstructive sleep apnea (adult) (pediatric)   . Other chronic allergic conjunctivitis   . Ulcer 1951   stomach    Family History  Problem Relation Age of Onset  . Colon cancer Father   . Stroke Mother   . Heart attack Mother   . Colon cancer Mother   . Rectal cancer Neg Hx   . Stomach cancer Neg Hx     Past Surgical History:  Procedure Laterality Date  . APPENDECTOMY    . CATARACT EXTRACTION W/ INTRAOCULAR LENS  IMPLANT, BILATERAL  2003  . COLONOSCOPY    . ESOPHAGOGASTRODUODENOSCOPY    . TONSILLECTOMY     Social History   Occupational History  . Not on file  Tobacco Use  . Smoking status: Never Smoker  . Smokeless tobacco: Never Used  Substance and Sexual Activity  . Alcohol use: No  . Drug use: No  . Sexual activity: Not on file

## 2018-04-26 ENCOUNTER — Ambulatory Visit (INDEPENDENT_AMBULATORY_CARE_PROVIDER_SITE_OTHER): Payer: Medicare Other | Admitting: Orthopedic Surgery

## 2018-04-26 DIAGNOSIS — L89890 Pressure ulcer of other site, unstageable: Secondary | ICD-10-CM | POA: Diagnosis not present

## 2018-04-26 DIAGNOSIS — R600 Localized edema: Secondary | ICD-10-CM | POA: Diagnosis not present

## 2018-04-26 DIAGNOSIS — F411 Generalized anxiety disorder: Secondary | ICD-10-CM | POA: Diagnosis not present

## 2018-04-26 DIAGNOSIS — F0390 Unspecified dementia without behavioral disturbance: Secondary | ICD-10-CM | POA: Diagnosis not present

## 2018-04-26 DIAGNOSIS — F329 Major depressive disorder, single episode, unspecified: Secondary | ICD-10-CM | POA: Diagnosis not present

## 2018-04-26 DIAGNOSIS — G4733 Obstructive sleep apnea (adult) (pediatric): Secondary | ICD-10-CM | POA: Diagnosis not present

## 2018-04-28 ENCOUNTER — Telehealth (INDEPENDENT_AMBULATORY_CARE_PROVIDER_SITE_OTHER): Payer: Self-pay | Admitting: Orthopedic Surgery

## 2018-04-28 ENCOUNTER — Encounter (INDEPENDENT_AMBULATORY_CARE_PROVIDER_SITE_OTHER): Payer: Self-pay | Admitting: Orthopedic Surgery

## 2018-04-28 ENCOUNTER — Ambulatory Visit (INDEPENDENT_AMBULATORY_CARE_PROVIDER_SITE_OTHER): Payer: Medicare Other | Admitting: Orthopedic Surgery

## 2018-04-28 DIAGNOSIS — M869 Osteomyelitis, unspecified: Secondary | ICD-10-CM | POA: Diagnosis not present

## 2018-04-28 DIAGNOSIS — M7022 Olecranon bursitis, left elbow: Secondary | ICD-10-CM

## 2018-04-28 NOTE — Telephone Encounter (Signed)
Pt has osteo of the left great toe. He was seen in the office recently and declined surgery. What dressing would you like HHN to apply for the pt?

## 2018-04-28 NOTE — Telephone Encounter (Signed)
Have the foot washed with soap and water daily and apply a dry dressing to the toe ulcer daily.

## 2018-04-28 NOTE — Telephone Encounter (Signed)
Zelphia Cairo -nurse from [facility] called advised patient is in a assistant living facility and do not have anyone to bring him to his appointment today. She said patient's POA would need to bring him. Genevieve asked if Dr Sharol Given will give verbal order for [facility] to take care of the wound on patient's left toe. The number to contact [facility] is [phone]

## 2018-04-28 NOTE — Progress Notes (Signed)
Office Visit Note   Patient: [name] [name]           Date of Birth: 25-Oct-1930           MRN: [id number] Visit Date: 04/28/2018              Requested by: Leanna Battles, MD [place], [place] [address] PCP: Leanna Battles, MD  No chief complaint on file.     HPI: Patient is an 84 year old gentleman who is a nursing home resident.  Patient had sausage digit swelling cellulitis and a Wagener grade 3 ulcer of the left great toe at the nursing home today they state the toe has gotten worse they are concerned the patient may need surgery.  Patient is also status post aspiration of the olecranon bursitis.  Assessment & Plan: Visit Diagnoses:  1. Osteomyelitis of great toe of left foot ([place])   2. Olecranon bursitis of left elbow     Plan: Recommend continued conservative therapy patient may be weightbearing as tolerated on the left foot no intervention for the left elbow at this time.  Discussed with the son if the redness or cellulitis extends past the ball of the great toe of the left foot that we should consider surgical intervention.  Follow-Up Instructions: Return if symptoms worsen or fail to improve.   Ortho Exam  Patient is alert, not oriented, no adenopathy, well-dressed, normal affect, normal respiratory effort. Patient does have dementia.  Examination of the left elbow of the bursitis has significantly resolved there is very minimal swelling at this time there is no redness no tenderness to palpation no signs of infection.  Examination of the left foot patient has a palpable pulse he has swelling there is sausage digit swelling of the left great toe however the cellulitis has resolved the ulcer is 3 mm in diameter and is superficial there is no pain to palpation.  Radiographs shows chronic osteomyelitis but clinically patient is stable.  Imaging: No results found. No images are attached to the encounter.  Labs: Lab Results  Component Value Date   REPTSTATUS 02/15/2018 FINAL 02/14/2018   CULT  02/14/2018    NO GROWTH Performed at [facility] [address] [place], [place] [address]      Lab Results  Component Value Date   ALBUMIN 2.7 (L) 02/14/2018   ALBUMIN 3.5 02/13/2018    There is no height or weight on file to calculate BMI.  Orders:  No orders of the defined types were placed in this encounter.  No orders of the defined types were placed in this encounter.    Procedures: No procedures performed  Clinical Data: No additional findings.  ROS:  All other systems negative, except as noted in the HPI. Review of Systems  Objective: Vital Signs: There were no vitals taken for this visit.  Specialty Comments:  No specialty comments available.  PMFS History: Patient Active Problem List   Diagnosis Date Noted  . Evaluation by psychiatric service required   . Hypoxia 02/13/2018  . Edema 02/13/2018  . Hypoxemia 02/13/2018  . Dementia 02/13/2018  . Altered mental status 02/13/2018  . Dermatitis 10/08/2015  . CHEST PAIN, ATYPICAL 04/11/2010  . Personal history of colon polyps and family history of colon cancer 12/19/2007  . Anxiety state 12/19/2007  . DEPRESSION 12/19/2007  . OBSTRUCTIVE SLEEP APNEA 12/19/2007  . ALLERGIC CONJUNCTIVITIS 12/19/2007  . Seasonal allergic rhinitis 12/19/2007  . GERD 12/19/2007  . IBS 12/19/2007  Past Medical History:  Diagnosis Date  . Allergic rhinitis, cause unspecified   . Anxiety state, unspecified   . Arthritis   . Benign neoplasm of colon   . Depressive disorder, not elsewhere classified   . Esophageal reflux   . Hyperlipidemia   . Irritable bowel syndrome   . Obstructive sleep apnea (adult) (pediatric)   . Other chronic allergic conjunctivitis   . Ulcer 1951   stomach    Family History  Problem Relation Age of Onset  . Colon cancer Father   . Stroke Mother   . Heart attack Mother   . Colon cancer Mother   . Rectal cancer Neg Hx   . Stomach  cancer Neg Hx     Past Surgical History:  Procedure Laterality Date  . APPENDECTOMY    . CATARACT EXTRACTION W/ INTRAOCULAR LENS  IMPLANT, BILATERAL  2003  . COLONOSCOPY    . ESOPHAGOGASTRODUODENOSCOPY    . TONSILLECTOMY     Social History   Occupational History  . Not on file  Tobacco Use  . Smoking status: Never Smoker  . Smokeless tobacco: Never Used  Substance and Sexual Activity  . Alcohol use: No  . Drug use: No  . Sexual activity: Not on file

## 2018-04-28 NOTE — Telephone Encounter (Signed)
This pt is in the office. Order will be written on orders form from [facility] and sent back with the pt.

## 2018-05-02 ENCOUNTER — Ambulatory Visit (INDEPENDENT_AMBULATORY_CARE_PROVIDER_SITE_OTHER): Payer: Medicare Other | Admitting: Orthopedic Surgery

## 2018-05-02 DIAGNOSIS — L03818 Cellulitis of other sites: Secondary | ICD-10-CM | POA: Diagnosis not present

## 2018-05-02 DIAGNOSIS — G4733 Obstructive sleep apnea (adult) (pediatric): Secondary | ICD-10-CM | POA: Diagnosis not present

## 2018-05-02 DIAGNOSIS — F0390 Unspecified dementia without behavioral disturbance: Secondary | ICD-10-CM | POA: Diagnosis not present

## 2018-05-02 DIAGNOSIS — R600 Localized edema: Secondary | ICD-10-CM | POA: Diagnosis not present

## 2018-05-02 DIAGNOSIS — L89890 Pressure ulcer of other site, unstageable: Secondary | ICD-10-CM | POA: Diagnosis not present

## 2018-05-02 DIAGNOSIS — S91109A Unspecified open wound of unspecified toe(s) without damage to nail, initial encounter: Secondary | ICD-10-CM | POA: Diagnosis not present

## 2018-05-02 DIAGNOSIS — F411 Generalized anxiety disorder: Secondary | ICD-10-CM | POA: Diagnosis not present

## 2018-05-02 DIAGNOSIS — F0281 Dementia in other diseases classified elsewhere with behavioral disturbance: Secondary | ICD-10-CM | POA: Diagnosis not present

## 2018-05-02 DIAGNOSIS — F329 Major depressive disorder, single episode, unspecified: Secondary | ICD-10-CM | POA: Diagnosis not present

## 2018-05-06 DIAGNOSIS — F411 Generalized anxiety disorder: Secondary | ICD-10-CM | POA: Diagnosis not present

## 2018-05-06 DIAGNOSIS — R600 Localized edema: Secondary | ICD-10-CM | POA: Diagnosis not present

## 2018-05-06 DIAGNOSIS — G4733 Obstructive sleep apnea (adult) (pediatric): Secondary | ICD-10-CM | POA: Diagnosis not present

## 2018-05-06 DIAGNOSIS — F0390 Unspecified dementia without behavioral disturbance: Secondary | ICD-10-CM | POA: Diagnosis not present

## 2018-05-06 DIAGNOSIS — F329 Major depressive disorder, single episode, unspecified: Secondary | ICD-10-CM | POA: Diagnosis not present

## 2018-05-06 DIAGNOSIS — L89890 Pressure ulcer of other site, unstageable: Secondary | ICD-10-CM | POA: Diagnosis not present

## 2018-05-10 DIAGNOSIS — F329 Major depressive disorder, single episode, unspecified: Secondary | ICD-10-CM | POA: Diagnosis not present

## 2018-05-10 DIAGNOSIS — R600 Localized edema: Secondary | ICD-10-CM | POA: Diagnosis not present

## 2018-05-10 DIAGNOSIS — L89890 Pressure ulcer of other site, unstageable: Secondary | ICD-10-CM | POA: Diagnosis not present

## 2018-05-10 DIAGNOSIS — G4733 Obstructive sleep apnea (adult) (pediatric): Secondary | ICD-10-CM | POA: Diagnosis not present

## 2018-05-10 DIAGNOSIS — F0390 Unspecified dementia without behavioral disturbance: Secondary | ICD-10-CM | POA: Diagnosis not present

## 2018-05-10 DIAGNOSIS — F411 Generalized anxiety disorder: Secondary | ICD-10-CM | POA: Diagnosis not present

## 2018-05-13 DIAGNOSIS — R600 Localized edema: Secondary | ICD-10-CM | POA: Diagnosis not present

## 2018-05-13 DIAGNOSIS — F0390 Unspecified dementia without behavioral disturbance: Secondary | ICD-10-CM | POA: Diagnosis not present

## 2018-05-13 DIAGNOSIS — F411 Generalized anxiety disorder: Secondary | ICD-10-CM | POA: Diagnosis not present

## 2018-05-13 DIAGNOSIS — G4733 Obstructive sleep apnea (adult) (pediatric): Secondary | ICD-10-CM | POA: Diagnosis not present

## 2018-05-13 DIAGNOSIS — F329 Major depressive disorder, single episode, unspecified: Secondary | ICD-10-CM | POA: Diagnosis not present

## 2018-05-13 DIAGNOSIS — L89890 Pressure ulcer of other site, unstageable: Secondary | ICD-10-CM | POA: Diagnosis not present

## 2018-05-16 DIAGNOSIS — S81802S Unspecified open wound, left lower leg, sequela: Secondary | ICD-10-CM | POA: Diagnosis not present

## 2018-05-16 DIAGNOSIS — F0281 Dementia in other diseases classified elsewhere with behavioral disturbance: Secondary | ICD-10-CM | POA: Diagnosis not present

## 2018-05-16 DIAGNOSIS — L309 Dermatitis, unspecified: Secondary | ICD-10-CM | POA: Diagnosis not present

## 2018-05-17 DIAGNOSIS — G4733 Obstructive sleep apnea (adult) (pediatric): Secondary | ICD-10-CM | POA: Diagnosis not present

## 2018-05-17 DIAGNOSIS — L89890 Pressure ulcer of other site, unstageable: Secondary | ICD-10-CM | POA: Diagnosis not present

## 2018-05-17 DIAGNOSIS — F0390 Unspecified dementia without behavioral disturbance: Secondary | ICD-10-CM | POA: Diagnosis not present

## 2018-05-17 DIAGNOSIS — F411 Generalized anxiety disorder: Secondary | ICD-10-CM | POA: Diagnosis not present

## 2018-05-17 DIAGNOSIS — F329 Major depressive disorder, single episode, unspecified: Secondary | ICD-10-CM | POA: Diagnosis not present

## 2018-05-17 DIAGNOSIS — R600 Localized edema: Secondary | ICD-10-CM | POA: Diagnosis not present

## 2018-05-20 DIAGNOSIS — F411 Generalized anxiety disorder: Secondary | ICD-10-CM | POA: Diagnosis not present

## 2018-05-20 DIAGNOSIS — G4733 Obstructive sleep apnea (adult) (pediatric): Secondary | ICD-10-CM | POA: Diagnosis not present

## 2018-05-20 DIAGNOSIS — R600 Localized edema: Secondary | ICD-10-CM | POA: Diagnosis not present

## 2018-05-20 DIAGNOSIS — F0390 Unspecified dementia without behavioral disturbance: Secondary | ICD-10-CM | POA: Diagnosis not present

## 2018-05-20 DIAGNOSIS — L89890 Pressure ulcer of other site, unstageable: Secondary | ICD-10-CM | POA: Diagnosis not present

## 2018-05-20 DIAGNOSIS — F329 Major depressive disorder, single episode, unspecified: Secondary | ICD-10-CM | POA: Diagnosis not present

## 2018-05-23 DIAGNOSIS — L309 Dermatitis, unspecified: Secondary | ICD-10-CM | POA: Diagnosis not present

## 2018-05-23 DIAGNOSIS — F0280 Dementia in other diseases classified elsewhere without behavioral disturbance: Secondary | ICD-10-CM | POA: Diagnosis not present

## 2018-05-24 DIAGNOSIS — F329 Major depressive disorder, single episode, unspecified: Secondary | ICD-10-CM | POA: Diagnosis not present

## 2018-05-24 DIAGNOSIS — R600 Localized edema: Secondary | ICD-10-CM | POA: Diagnosis not present

## 2018-05-24 DIAGNOSIS — F0390 Unspecified dementia without behavioral disturbance: Secondary | ICD-10-CM | POA: Diagnosis not present

## 2018-05-24 DIAGNOSIS — L89890 Pressure ulcer of other site, unstageable: Secondary | ICD-10-CM | POA: Diagnosis not present

## 2018-05-24 DIAGNOSIS — F411 Generalized anxiety disorder: Secondary | ICD-10-CM | POA: Diagnosis not present

## 2018-05-24 DIAGNOSIS — G4733 Obstructive sleep apnea (adult) (pediatric): Secondary | ICD-10-CM | POA: Diagnosis not present

## 2018-05-27 DIAGNOSIS — F411 Generalized anxiety disorder: Secondary | ICD-10-CM | POA: Diagnosis not present

## 2018-05-27 DIAGNOSIS — L89890 Pressure ulcer of other site, unstageable: Secondary | ICD-10-CM | POA: Diagnosis not present

## 2018-05-27 DIAGNOSIS — F0390 Unspecified dementia without behavioral disturbance: Secondary | ICD-10-CM | POA: Diagnosis not present

## 2018-05-27 DIAGNOSIS — G4733 Obstructive sleep apnea (adult) (pediatric): Secondary | ICD-10-CM | POA: Diagnosis not present

## 2018-05-27 DIAGNOSIS — R600 Localized edema: Secondary | ICD-10-CM | POA: Diagnosis not present

## 2018-05-27 DIAGNOSIS — F329 Major depressive disorder, single episode, unspecified: Secondary | ICD-10-CM | POA: Diagnosis not present

## 2018-06-01 DIAGNOSIS — F0390 Unspecified dementia without behavioral disturbance: Secondary | ICD-10-CM | POA: Diagnosis not present

## 2018-06-01 DIAGNOSIS — F329 Major depressive disorder, single episode, unspecified: Secondary | ICD-10-CM | POA: Diagnosis not present

## 2018-06-01 DIAGNOSIS — R600 Localized edema: Secondary | ICD-10-CM | POA: Diagnosis not present

## 2018-06-01 DIAGNOSIS — L89890 Pressure ulcer of other site, unstageable: Secondary | ICD-10-CM | POA: Diagnosis not present

## 2018-06-01 DIAGNOSIS — G4733 Obstructive sleep apnea (adult) (pediatric): Secondary | ICD-10-CM | POA: Diagnosis not present

## 2018-06-01 DIAGNOSIS — F411 Generalized anxiety disorder: Secondary | ICD-10-CM | POA: Diagnosis not present

## 2018-06-03 ENCOUNTER — Encounter (INDEPENDENT_AMBULATORY_CARE_PROVIDER_SITE_OTHER): Payer: Self-pay | Admitting: Family

## 2018-06-03 DIAGNOSIS — L89890 Pressure ulcer of other site, unstageable: Secondary | ICD-10-CM | POA: Diagnosis not present

## 2018-06-03 DIAGNOSIS — R600 Localized edema: Secondary | ICD-10-CM | POA: Diagnosis not present

## 2018-06-03 DIAGNOSIS — G4733 Obstructive sleep apnea (adult) (pediatric): Secondary | ICD-10-CM | POA: Diagnosis not present

## 2018-06-03 DIAGNOSIS — F0390 Unspecified dementia without behavioral disturbance: Secondary | ICD-10-CM | POA: Diagnosis not present

## 2018-06-03 DIAGNOSIS — F411 Generalized anxiety disorder: Secondary | ICD-10-CM | POA: Diagnosis not present

## 2018-06-03 DIAGNOSIS — F329 Major depressive disorder, single episode, unspecified: Secondary | ICD-10-CM | POA: Diagnosis not present

## 2018-06-06 DIAGNOSIS — L309 Dermatitis, unspecified: Secondary | ICD-10-CM | POA: Diagnosis not present

## 2018-06-06 DIAGNOSIS — R609 Edema, unspecified: Secondary | ICD-10-CM | POA: Diagnosis not present

## 2018-06-06 DIAGNOSIS — S91109A Unspecified open wound of unspecified toe(s) without damage to nail, initial encounter: Secondary | ICD-10-CM | POA: Diagnosis not present

## 2018-06-06 DIAGNOSIS — F0280 Dementia in other diseases classified elsewhere without behavioral disturbance: Secondary | ICD-10-CM | POA: Diagnosis not present

## 2018-06-09 DIAGNOSIS — G4733 Obstructive sleep apnea (adult) (pediatric): Secondary | ICD-10-CM | POA: Diagnosis not present

## 2018-06-09 DIAGNOSIS — L89890 Pressure ulcer of other site, unstageable: Secondary | ICD-10-CM | POA: Diagnosis not present

## 2018-06-09 DIAGNOSIS — F0390 Unspecified dementia without behavioral disturbance: Secondary | ICD-10-CM | POA: Diagnosis not present

## 2018-06-09 DIAGNOSIS — R600 Localized edema: Secondary | ICD-10-CM | POA: Diagnosis not present

## 2018-06-09 DIAGNOSIS — F411 Generalized anxiety disorder: Secondary | ICD-10-CM | POA: Diagnosis not present

## 2018-06-09 DIAGNOSIS — F329 Major depressive disorder, single episode, unspecified: Secondary | ICD-10-CM | POA: Diagnosis not present

## 2018-06-20 DIAGNOSIS — K219 Gastro-esophageal reflux disease without esophagitis: Secondary | ICD-10-CM | POA: Diagnosis not present

## 2018-06-20 DIAGNOSIS — L309 Dermatitis, unspecified: Secondary | ICD-10-CM | POA: Diagnosis not present

## 2018-06-20 DIAGNOSIS — J309 Allergic rhinitis, unspecified: Secondary | ICD-10-CM | POA: Diagnosis not present

## 2018-06-30 DIAGNOSIS — E7849 Other hyperlipidemia: Secondary | ICD-10-CM | POA: Diagnosis not present

## 2018-06-30 DIAGNOSIS — K0501 Acute gingivitis, non-plaque induced: Secondary | ICD-10-CM | POA: Diagnosis not present

## 2018-06-30 DIAGNOSIS — G308 Other Alzheimer's disease: Secondary | ICD-10-CM | POA: Diagnosis not present

## 2018-06-30 DIAGNOSIS — J309 Allergic rhinitis, unspecified: Secondary | ICD-10-CM | POA: Diagnosis not present

## 2018-07-04 DIAGNOSIS — J309 Allergic rhinitis, unspecified: Secondary | ICD-10-CM | POA: Diagnosis not present

## 2018-07-04 DIAGNOSIS — K047 Periapical abscess without sinus: Secondary | ICD-10-CM | POA: Diagnosis not present

## 2018-07-04 DIAGNOSIS — E7849 Other hyperlipidemia: Secondary | ICD-10-CM | POA: Diagnosis not present

## 2018-07-04 DIAGNOSIS — F0280 Dementia in other diseases classified elsewhere without behavioral disturbance: Secondary | ICD-10-CM | POA: Diagnosis not present

## 2018-07-07 DIAGNOSIS — Z79899 Other long term (current) drug therapy: Secondary | ICD-10-CM | POA: Diagnosis not present

## 2018-07-18 DIAGNOSIS — F0280 Dementia in other diseases classified elsewhere without behavioral disturbance: Secondary | ICD-10-CM | POA: Diagnosis not present

## 2018-07-18 DIAGNOSIS — R591 Generalized enlarged lymph nodes: Secondary | ICD-10-CM | POA: Diagnosis not present

## 2018-07-18 DIAGNOSIS — K047 Periapical abscess without sinus: Secondary | ICD-10-CM | POA: Diagnosis not present

## 2018-09-19 DIAGNOSIS — L309 Dermatitis, unspecified: Secondary | ICD-10-CM | POA: Diagnosis not present

## 2018-09-19 DIAGNOSIS — R600 Localized edema: Secondary | ICD-10-CM | POA: Diagnosis not present

## 2018-09-19 DIAGNOSIS — J309 Allergic rhinitis, unspecified: Secondary | ICD-10-CM | POA: Diagnosis not present

## 2018-09-19 DIAGNOSIS — E7849 Other hyperlipidemia: Secondary | ICD-10-CM | POA: Diagnosis not present

## 2019-01-11 DIAGNOSIS — E785 Hyperlipidemia, unspecified: Secondary | ICD-10-CM | POA: Diagnosis not present

## 2019-01-11 DIAGNOSIS — Z79899 Other long term (current) drug therapy: Secondary | ICD-10-CM | POA: Diagnosis not present

## 2019-01-12 DIAGNOSIS — Z79899 Other long term (current) drug therapy: Secondary | ICD-10-CM | POA: Diagnosis not present

## 2019-01-12 DIAGNOSIS — E785 Hyperlipidemia, unspecified: Secondary | ICD-10-CM | POA: Diagnosis not present

## 2019-01-16 DIAGNOSIS — L309 Dermatitis, unspecified: Secondary | ICD-10-CM | POA: Diagnosis not present

## 2019-01-16 DIAGNOSIS — R21 Rash and other nonspecific skin eruption: Secondary | ICD-10-CM | POA: Diagnosis not present

## 2019-01-16 DIAGNOSIS — J309 Allergic rhinitis, unspecified: Secondary | ICD-10-CM | POA: Diagnosis not present

## 2019-02-19 ENCOUNTER — Emergency Department (HOSPITAL_COMMUNITY): Payer: Medicare Other

## 2019-02-19 ENCOUNTER — Encounter (HOSPITAL_COMMUNITY): Payer: Self-pay

## 2019-02-19 ENCOUNTER — Other Ambulatory Visit: Payer: Self-pay

## 2019-02-19 ENCOUNTER — Inpatient Hospital Stay (HOSPITAL_COMMUNITY)
Admission: EM | Admit: 2019-02-19 | Discharge: 2019-02-24 | DRG: 481 | Disposition: A | Discharge status: Skilled Nursing Facility | Payer: Medicare Other | Source: Skilled Nursing Facility | Attending: Internal Medicine | Admitting: Internal Medicine

## 2019-02-19 DIAGNOSIS — M1990 Unspecified osteoarthritis, unspecified site: Secondary | ICD-10-CM | POA: Diagnosis not present

## 2019-02-19 DIAGNOSIS — D62 Acute posthemorrhagic anemia: Secondary | ICD-10-CM | POA: Diagnosis not present

## 2019-02-19 DIAGNOSIS — R600 Localized edema: Secondary | ICD-10-CM | POA: Diagnosis not present

## 2019-02-19 DIAGNOSIS — Y92128 Other place in nursing home as the place of occurrence of the external cause: Secondary | ICD-10-CM | POA: Diagnosis not present

## 2019-02-19 DIAGNOSIS — S7291XA Unspecified fracture of right femur, initial encounter for closed fracture: Secondary | ICD-10-CM | POA: Diagnosis not present

## 2019-02-19 DIAGNOSIS — M7522 Bicipital tendinitis, left shoulder: Secondary | ICD-10-CM | POA: Diagnosis present

## 2019-02-19 DIAGNOSIS — K219 Gastro-esophageal reflux disease without esophagitis: Secondary | ICD-10-CM | POA: Diagnosis present

## 2019-02-19 DIAGNOSIS — S72001A Fracture of unspecified part of neck of right femur, initial encounter for closed fracture: Secondary | ICD-10-CM | POA: Diagnosis present

## 2019-02-19 DIAGNOSIS — R079 Chest pain, unspecified: Secondary | ICD-10-CM | POA: Diagnosis not present

## 2019-02-19 DIAGNOSIS — R5381 Other malaise: Secondary | ICD-10-CM | POA: Diagnosis not present

## 2019-02-19 DIAGNOSIS — W19XXXA Unspecified fall, initial encounter: Secondary | ICD-10-CM | POA: Diagnosis not present

## 2019-02-19 DIAGNOSIS — S72011A Unspecified intracapsular fracture of right femur, initial encounter for closed fracture: Principal | ICD-10-CM | POA: Diagnosis present

## 2019-02-19 DIAGNOSIS — M4802 Spinal stenosis, cervical region: Secondary | ICD-10-CM | POA: Diagnosis not present

## 2019-02-19 DIAGNOSIS — W1830XA Fall on same level, unspecified, initial encounter: Secondary | ICD-10-CM | POA: Diagnosis present

## 2019-02-19 DIAGNOSIS — Z880 Allergy status to penicillin: Secondary | ICD-10-CM

## 2019-02-19 DIAGNOSIS — Z8711 Personal history of peptic ulcer disease: Secondary | ICD-10-CM

## 2019-02-19 DIAGNOSIS — F329 Major depressive disorder, single episode, unspecified: Secondary | ICD-10-CM | POA: Diagnosis present

## 2019-02-19 DIAGNOSIS — Z8249 Family history of ischemic heart disease and other diseases of the circulatory system: Secondary | ICD-10-CM | POA: Diagnosis not present

## 2019-02-19 DIAGNOSIS — F0390 Unspecified dementia without behavioral disturbance: Secondary | ICD-10-CM | POA: Diagnosis present

## 2019-02-19 DIAGNOSIS — S299XXA Unspecified injury of thorax, initial encounter: Secondary | ICD-10-CM | POA: Diagnosis not present

## 2019-02-19 DIAGNOSIS — Z7401 Bed confinement status: Secondary | ICD-10-CM | POA: Diagnosis not present

## 2019-02-19 DIAGNOSIS — R404 Transient alteration of awareness: Secondary | ICD-10-CM | POA: Diagnosis not present

## 2019-02-19 DIAGNOSIS — Z885 Allergy status to narcotic agent status: Secondary | ICD-10-CM

## 2019-02-19 DIAGNOSIS — S8991XA Unspecified injury of right lower leg, initial encounter: Secondary | ICD-10-CM | POA: Diagnosis not present

## 2019-02-19 DIAGNOSIS — Z419 Encounter for procedure for purposes other than remedying health state, unspecified: Secondary | ICD-10-CM

## 2019-02-19 DIAGNOSIS — M25561 Pain in right knee: Secondary | ICD-10-CM | POA: Diagnosis not present

## 2019-02-19 DIAGNOSIS — W19XXXD Unspecified fall, subsequent encounter: Secondary | ICD-10-CM | POA: Diagnosis not present

## 2019-02-19 DIAGNOSIS — F411 Generalized anxiety disorder: Secondary | ICD-10-CM | POA: Diagnosis present

## 2019-02-19 DIAGNOSIS — F0391 Unspecified dementia with behavioral disturbance: Secondary | ICD-10-CM | POA: Diagnosis present

## 2019-02-19 DIAGNOSIS — Z1159 Encounter for screening for other viral diseases: Secondary | ICD-10-CM | POA: Diagnosis not present

## 2019-02-19 DIAGNOSIS — G4733 Obstructive sleep apnea (adult) (pediatric): Secondary | ICD-10-CM | POA: Diagnosis present

## 2019-02-19 DIAGNOSIS — M25551 Pain in right hip: Secondary | ICD-10-CM | POA: Diagnosis not present

## 2019-02-19 DIAGNOSIS — Z800 Family history of malignant neoplasm of digestive organs: Secondary | ICD-10-CM | POA: Diagnosis not present

## 2019-02-19 DIAGNOSIS — E785 Hyperlipidemia, unspecified: Secondary | ICD-10-CM | POA: Diagnosis not present

## 2019-02-19 DIAGNOSIS — G4730 Sleep apnea, unspecified: Secondary | ICD-10-CM | POA: Diagnosis not present

## 2019-02-19 DIAGNOSIS — Z79899 Other long term (current) drug therapy: Secondary | ICD-10-CM

## 2019-02-19 DIAGNOSIS — Z4789 Encounter for other orthopedic aftercare: Secondary | ICD-10-CM | POA: Diagnosis not present

## 2019-02-19 DIAGNOSIS — Y92129 Unspecified place in nursing home as the place of occurrence of the external cause: Secondary | ICD-10-CM

## 2019-02-19 DIAGNOSIS — I10 Essential (primary) hypertension: Secondary | ICD-10-CM | POA: Diagnosis present

## 2019-02-19 DIAGNOSIS — S72009A Fracture of unspecified part of neck of unspecified femur, initial encounter for closed fracture: Secondary | ICD-10-CM | POA: Insufficient documentation

## 2019-02-19 DIAGNOSIS — Z823 Family history of stroke: Secondary | ICD-10-CM

## 2019-02-19 DIAGNOSIS — M6281 Muscle weakness (generalized): Secondary | ICD-10-CM | POA: Diagnosis not present

## 2019-02-19 DIAGNOSIS — M25572 Pain in left ankle and joints of left foot: Secondary | ICD-10-CM | POA: Diagnosis not present

## 2019-02-19 DIAGNOSIS — S0990XA Unspecified injury of head, initial encounter: Secondary | ICD-10-CM | POA: Diagnosis not present

## 2019-02-19 DIAGNOSIS — M2550 Pain in unspecified joint: Secondary | ICD-10-CM | POA: Diagnosis not present

## 2019-02-19 DIAGNOSIS — S72001D Fracture of unspecified part of neck of right femur, subsequent encounter for closed fracture with routine healing: Secondary | ICD-10-CM | POA: Diagnosis not present

## 2019-02-19 DIAGNOSIS — Z03818 Encounter for observation for suspected exposure to other biological agents ruled out: Secondary | ICD-10-CM | POA: Diagnosis not present

## 2019-02-19 LAB — CBC WITH DIFFERENTIAL/PLATELET
Abs Immature Granulocytes: 0.01 10*3/uL (ref 0.00–0.07)
Basophils Absolute: 0 10*3/uL (ref 0.0–0.1)
Basophils Relative: 0 %
Eosinophils Absolute: 0.2 10*3/uL (ref 0.0–0.5)
Eosinophils Relative: 4 %
HCT: 39.7 % (ref 39.0–52.0)
Hemoglobin: 12.6 g/dL — ABNORMAL LOW (ref 13.0–17.0)
Immature Granulocytes: 0 %
Lymphocytes Relative: 15 %
Lymphs Abs: 0.8 10*3/uL (ref 0.7–4.0)
MCH: 29.9 pg (ref 26.0–34.0)
MCHC: 31.7 g/dL (ref 30.0–36.0)
MCV: 94.1 fL (ref 80.0–100.0)
Monocytes Absolute: 0.6 10*3/uL (ref 0.1–1.0)
Monocytes Relative: 12 %
Neutro Abs: 3.4 10*3/uL (ref 1.7–7.7)
Neutrophils Relative %: 69 %
Platelets: 159 10*3/uL (ref 150–400)
RBC: 4.22 MIL/uL (ref 4.22–5.81)
RDW: 13.5 % (ref 11.5–15.5)
WBC: 4.9 10*3/uL (ref 4.0–10.5)
nRBC: 0 % (ref 0.0–0.2)

## 2019-02-19 LAB — URINALYSIS, ROUTINE W REFLEX MICROSCOPIC
Bilirubin Urine: NEGATIVE
Glucose, UA: NEGATIVE mg/dL
Hgb urine dipstick: NEGATIVE
Ketones, ur: NEGATIVE mg/dL
Leukocytes,Ua: NEGATIVE
Nitrite: NEGATIVE
Protein, ur: NEGATIVE mg/dL
Specific Gravity, Urine: 1.008 (ref 1.005–1.030)
pH: 7 (ref 5.0–8.0)

## 2019-02-19 LAB — BASIC METABOLIC PANEL
Anion gap: 7 (ref 5–15)
BUN: 22 mg/dL (ref 8–23)
CO2: 30 mmol/L (ref 22–32)
Calcium: 8.6 mg/dL — ABNORMAL LOW (ref 8.9–10.3)
Chloride: 103 mmol/L (ref 98–111)
Creatinine, Ser: 0.78 mg/dL (ref 0.61–1.24)
GFR calc Af Amer: >60 mL/min (ref >60)
GFR calc non Af Amer: >60 mL/min (ref >60)
Glucose, Bld: 98 mg/dL (ref 70–99)
Potassium: 3.8 mmol/L (ref 3.5–5.1)
Sodium: 140 mmol/L (ref 135–145)

## 2019-02-19 LAB — TROPONIN I: Troponin I: <0.03 ng/mL (ref <0.03)

## 2019-02-19 LAB — PROTIME-INR
INR: 0.9 (ref 0.8–1.2)
Prothrombin Time: 12.2 seconds (ref 11.4–15.2)

## 2019-02-19 LAB — SARS CORONAVIRUS 2 BY RT PCR (HOSPITAL ORDER, PERFORMED IN ~~LOC~~ HOSPITAL LAB): SARS Coronavirus 2: NEGATIVE

## 2019-02-19 MED ORDER — ENSURE PRE-SURGERY PO LIQD
296.0000 mL | Freq: Once | ORAL | Status: DC
  Filled 2019-02-19: qty 296

## 2019-02-19 MED ORDER — TRANEXAMIC ACID 1000 MG/10ML IV SOLN
2000.0000 mg | INTRAVENOUS | Status: DC
  Filled 2019-02-19: qty 20

## 2019-02-19 MED ORDER — MORPHINE SULFATE (PF) 2 MG/ML IV SOLN
2.0000 mg | INTRAVENOUS | Status: AC | PRN
  Administered 2019-02-19 (×2): 2 mg via INTRAVENOUS
  Filled 2019-02-19 (×2): qty 1

## 2019-02-19 MED ORDER — POVIDONE-IODINE 10 % EX SWAB: 2.0000 "application " | Freq: Once | CUTANEOUS | Status: DC

## 2019-02-19 MED ORDER — MORPHINE SULFATE (PF) 2 MG/ML IV SOLN
2.0000 mg | INTRAVENOUS | Status: AC | PRN
  Administered 2019-02-19 – 2019-02-21 (×2): 2 mg via INTRAVENOUS
  Filled 2019-02-19 (×2): qty 1

## 2019-02-19 MED ORDER — TRANEXAMIC ACID-NACL 1000-0.7 MG/100ML-% IV SOLN
1000.0000 mg | INTRAVENOUS | Status: AC
  Administered 2019-02-20: 1000 mg via INTRAVENOUS
  Filled 2019-02-19: qty 100

## 2019-02-19 MED ORDER — CEFAZOLIN SODIUM-DEXTROSE 2-4 GM/100ML-% IV SOLN
2.0000 g | INTRAVENOUS | Status: AC
  Administered 2019-02-20: 2 g via INTRAVENOUS
  Filled 2019-02-19: qty 100

## 2019-02-19 MED ORDER — MORPHINE SULFATE (PF) 2 MG/ML IV SOLN
0.5000 mg | INTRAVENOUS | Status: DC | PRN
  Administered 2019-02-20: 0.5 mg via INTRAVENOUS
  Filled 2019-02-19: qty 1

## 2019-02-19 NOTE — ED Notes (Signed)
[facility] at bedside to transport patient to [facility]. Patient alert and stable at discharge. Unable to sign for transfer due to dementia.

## 2019-02-19 NOTE — ED Notes (Signed)
[facility]) contacted and provided update on patient status. Telephone number is [phone].

## 2019-02-19 NOTE — H&P (Signed)
History and Physical    Matty Rhydian Baldi WNI:[id number] DOB: Jul 12, 1931 DOA: 02/19/2019  PCP: Leanna Battles, MD  Patient coming from: [facility].  Chief Complaint: Fall.  HPI: [name] [name] is a 85 y.o. male with dementia, hypertension, hyperlipidemia, sleep apnea was brought to the ER the patient was found to have weakness fall at the nursing station at the living facility.  Patient fell towards his right side and started having pain in the right hip and was brought to the ER.  ED Course: In the ER EKG shows sinus bradycardia heart rate around 51 bpm.  X-rays revealed right hip fracture.  CT head C-spine was unremarkable.  Lab work show hemoglobin to be around 12.6 and patient has chronic anemia.  Orthopedic on-call was consulted.  And after discussing with patient's healthcare power of attorney Mr. Noah Delaine at [phone] by the orthopedic surgeon patient has been transferred to [facility] for further surgical management.  Review of Systems: As per HPI, rest all negative.   Past Medical History:  Diagnosis Date   Allergic rhinitis, cause unspecified    Anxiety state, unspecified    Arthritis    Benign neoplasm of colon    Depressive disorder, not elsewhere classified    Esophageal reflux    Hyperlipidemia    Irritable bowel syndrome    Obstructive sleep apnea (adult) (pediatric)    Other chronic allergic conjunctivitis    Ulcer 1951   stomach    Past Surgical History:  Procedure Laterality Date   APPENDECTOMY     CATARACT EXTRACTION W/ INTRAOCULAR LENS  IMPLANT, BILATERAL  2003   COLONOSCOPY     ESOPHAGOGASTRODUODENOSCOPY     TONSILLECTOMY       reports that he has never smoked. He has never used smokeless tobacco. He reports that he does not drink alcohol or use drugs.  Allergies  Allergen Reactions   Codeine     Other reaction(s): Unknown   Penicillins Rash   Ranitidine     Other reaction(s): Unknown    Amoxicillin Rash    Family History  Problem Relation Age of Onset   Colon cancer Father    Stroke Mother    Heart attack Mother    Colon cancer Mother    Rectal cancer Neg Hx    Stomach cancer Neg Hx     Prior to Admission medications   Medication Sig Start Date End Date Taking? Authorizing Provider  cetirizine (ZYRTEC) 10 MG tablet Take 10 mg by mouth daily. 09/20/18  Yes [provider]  Cholecalciferol (VITAMIN D) 2000 units tablet Take 1 tablet (2,000 Units total) by mouth daily. 03/08/18  Yes Mariel Aloe, MD  hydrOXYzine (ATARAX/VISTARIL) 25 MG tablet Take 1 tablet (25 mg total) by mouth 3 (three) times daily as needed for itching. 03/08/18  Yes Mariel Aloe, MD  pravastatin (PRAVACHOL) 10 MG tablet Take 1 tablet (10 mg total) by mouth daily. 03/09/18  Yes Mariel Aloe, MD  acetaminophen (TYLENOL) 325 MG tablet Take 2 tablets (650 mg total) by mouth every 6 (six) hours as needed for mild pain or headache. 03/08/18   Mariel Aloe, MD  doxycycline (VIBRA-TABS) 100 MG tablet Take 1 tablet (100 mg total) by mouth 2 (two) times daily. Patient not taking: Reported on 02/19/2019 04/06/18   Suzan Slick, NP    Physical Exam: Vitals:   02/19/19 1754 02/19/19 1854 02/19/19 1900 02/19/19 1930  BP: (!) 143/100 (!) 161/82 Marland Kitchen)  155/96 (!) 155/80  Pulse: (!) 50 72 67 60  Resp: 18 18 18 18   Temp:      TempSrc:      SpO2: 100% 98% 95% 95%  Weight:      Height:          Constitutional: Moderately built and nourished. Vitals:   02/19/19 1754 02/19/19 1854 02/19/19 1900 02/19/19 1930  BP: (!) 143/100 (!) 161/82 (!) 155/96 (!) 155/80  Pulse: (!) 50 72 67 60  Resp: 18 18 18 18   Temp:      TempSrc:      SpO2: 100% 98% 95% 95%  Weight:      Height:       Eyes: Anicteric no pallor. ENMT: No discharge from the ears eyes nose and mouth. Neck: No mass or.  No neck rigidity. Respiratory: No rhonchi or crepitations. Cardiovascular: S1-S2 heard. Abdomen: Soft nontender  bowel sounds present. Musculoskeletal: Pain on moving right hip. Skin: No rash. Neurologic: Alert awake oriented to his name.  Follows commands moves all extremities. Psychiatric: Has dementia.   Labs on Admission: I have personally reviewed following labs and imaging studies  CBC: Recent Labs  Lab 02/19/19 1752  WBC 4.9  NEUTROABS 3.4  HGB 12.6*  HCT 39.7  MCV 94.1  PLT 110   Basic Metabolic Panel: Recent Labs  Lab 02/19/19 1752  NA 140  K 3.8  CL 103  CO2 30  GLUCOSE 98  BUN 22  CREATININE 0.78  CALCIUM 8.6*   GFR: Estimated Creatinine Clearance: 77.9 mL/min (by C-G formula based on SCr of 0.78 mg/dL). Liver Function Tests: No results for input(s): AST, ALT, ALKPHOS, BILITOT, PROT, ALBUMIN in the last 168 hours. No results for input(s): LIPASE, AMYLASE in the last 168 hours. No results for input(s): AMMONIA in the last 168 hours. Coagulation Profile: Recent Labs  Lab 02/19/19 1752  INR 0.9   Cardiac Enzymes: Recent Labs  Lab 02/19/19 1752  TROPONINI <0.03   BNP (last 3 results) No results for input(s): PROBNP in the last 8760 hours. HbA1C: No results for input(s): HGBA1C in the last 72 hours. CBG: No results for input(s): GLUCAP in the last 168 hours. Lipid Profile: No results for input(s): CHOL, HDL, LDLCALC, TRIG, CHOLHDL, LDLDIRECT in the last 72 hours. Thyroid Function Tests: No results for input(s): TSH, T4TOTAL, FREET4, T3FREE, THYROIDAB in the last 72 hours. Anemia Panel: No results for input(s): VITAMINB12, FOLATE, FERRITIN, TIBC, IRON, RETICCTPCT in the last 72 hours. Urine analysis:    Component Value Date/Time   COLORURINE STRAW (A) 02/19/2019 1708   APPEARANCEUR CLEAR 02/19/2019 1708   LABSPEC 1.008 02/19/2019 1708   PHURINE 7.0 02/19/2019 1708   GLUCOSEU NEGATIVE 02/19/2019 1708   HGBUR NEGATIVE 02/19/2019 1708   BILIRUBINUR NEGATIVE 02/19/2019 1708   KETONESUR NEGATIVE 02/19/2019 1708   PROTEINUR NEGATIVE 02/19/2019 1708    NITRITE NEGATIVE 02/19/2019 1708   LEUKOCYTESUR NEGATIVE 02/19/2019 1708   Sepsis Labs: @LABRCNTIP (procalcitonin:4,lacticidven:4) ) Recent Results (from the past 240 hour(s))  SARS Coronavirus 2 (CEPHEID - Performed in [facility] hospital lab), Hosp Order     Status: None   Collection Time: 02/19/19  6:44 PM   Specimen: Nasopharyngeal Swab  Result Value Ref Range Status   SARS Coronavirus 2 NEGATIVE NEGATIVE Final    Comment: (NOTE) If result is NEGATIVE SARS-CoV-2 target nucleic acids are NOT DETECTED. The SARS-CoV-2 RNA is generally detectable in upper and lower  respiratory specimens during the acute phase of infection. The  lowest  concentration of SARS-CoV-2 viral copies this assay can detect is 250  copies / mL. A negative result does not preclude SARS-CoV-2 infection  and should not be used as the sole basis for treatment or other  patient management decisions.  A negative result may occur with  improper specimen collection / handling, submission of specimen other  than nasopharyngeal swab, presence of viral mutation(s) within the  areas targeted by this assay, and inadequate number of viral copies  (<250 copies / mL). A negative result must be combined with clinical  observations, patient history, and epidemiological information. If result is POSITIVE SARS-CoV-2 target nucleic acids are DETECTED. The SARS-CoV-2 RNA is generally detectable in upper and lower  respiratory specimens dur ing the acute phase of infection.  Positive  results are indicative of active infection with SARS-CoV-2.  Clinical  correlation with patient history and other diagnostic information is  necessary to determine patient infection status.  Positive results do  not rule out bacterial infection or co-infection with other viruses. If result is PRESUMPTIVE POSTIVE SARS-CoV-2 nucleic acids MAY BE PRESENT.   A presumptive positive result was obtained on the submitted specimen  and confirmed on repeat  testing.  While 2019 novel coronavirus  (SARS-CoV-2) nucleic acids may be present in the submitted sample  additional confirmatory testing may be necessary for epidemiological  and / or clinical management purposes  to differentiate between  SARS-CoV-2 and other Sarbecovirus currently known to infect humans.  If clinically indicated additional testing with an alternate test  methodology [phone]) is advised. The SARS-CoV-2 RNA is generally  detectable in upper and lower respiratory sp ecimens during the acute  phase of infection. The expected result is Negative. Fact Sheet for Patients:  StrictlyIdeas.no Fact Sheet for Healthcare Providers: BankingDealers.co.za This test is not yet approved or cleared by the [place] FDA and has been authorized for detection and/or diagnosis of SARS-CoV-2 by FDA under an Emergency Use Authorization (EUA).  This EUA will remain in effect (meaning this test can be used) for the duration of the COVID-19 declaration under Section 564(b)(1) of the Act, 21 U.S.C. section 360bbb-3(b)(1), unless the authorization is terminated or revoked sooner. Performed at [facility], [place] [address] [place], [place] [address]      Radiological Exams on Admission: Dg Chest 1 View  Result Date: 02/19/2019 CLINICAL DATA:  Pain after fall EXAM: CHEST  1 VIEW COMPARISON:  February 13, 2018 FINDINGS: The heart size and mediastinal contours are within normal limits. Both lungs are clear. The visualized skeletal structures are unremarkable. IMPRESSION: No active disease. Electronically Signed   By: Dorise Bullion III M.D   On: 02/19/2019 16:58   Ct Head Wo Contrast  Result Date: 02/19/2019 CLINICAL DATA:  Head trauma, no neurological decline, follow-up EXAM: CT HEAD WITHOUT CONTRAST CT CERVICAL SPINE WITHOUT CONTRAST TECHNIQUE: Multidetector CT imaging of the head and cervical spine was performed following the  standard protocol without intravenous contrast. Multiplanar CT image reconstructions of the cervical spine were also generated. COMPARISON:  02/14/2018 CT head, cervical spine radiographs 06/09/2016 FINDINGS: CT HEAD FINDINGS Brain: Generalized atrophy. Normal ventricular morphology. No midline shift or mass effect. Small vessel chronic ischemic changes of deep cerebral white matter. Benign-appearing basal ganglia calcifications bilaterally. No intracranial hemorrhage, mass lesion, or evidence of acute infarction. Vascular: No hyperdense vessels. Minimal atherosclerotic calcification of internal carotid and vertebral arteries at skull base Skull: Demineralized but intact Sinuses/Orbits: Clear Other: N/A CT CERVICAL SPINE FINDINGS Alignment:  2 mm anterolisthesis C6-C7 unchanged. Otherwise normal alignment. Thoracic kyphosis. Skull base and vertebrae: Osseous demineralization. Multilevel facet degenerative changes. Scattered mild disc space narrowing. Vertebral body heights maintained. No fracture or bone destruction. Soft tissues and spinal canal: Prevertebral soft tissues normal thickness. Atherosclerotic calcifications of the carotid bifurcations. Remaining cervical soft tissues unremarkable. Disc levels:  Unremarkable Upper chest: Minimal biapical scarring. Other: N/A IMPRESSION: Atrophy with small vessel chronic ischemic changes of deep cerebral white matter. No acute intracranial abnormalities. Osseous demineralization with degenerative facet disease changes of the cervical spine. No acute cervical spine abnormalities. Electronically Signed   By: Lavonia Dana M.D.   On: 02/19/2019 17:18   Ct Cervical Spine Wo Contrast  Result Date: 02/19/2019 CLINICAL DATA:  Head trauma, no neurological decline, follow-up EXAM: CT HEAD WITHOUT CONTRAST CT CERVICAL SPINE WITHOUT CONTRAST TECHNIQUE: Multidetector CT imaging of the head and cervical spine was performed following the standard protocol without intravenous  contrast. Multiplanar CT image reconstructions of the cervical spine were also generated. COMPARISON:  02/14/2018 CT head, cervical spine radiographs 06/09/2016 FINDINGS: CT HEAD FINDINGS Brain: Generalized atrophy. Normal ventricular morphology. No midline shift or mass effect. Small vessel chronic ischemic changes of deep cerebral white matter. Benign-appearing basal ganglia calcifications bilaterally. No intracranial hemorrhage, mass lesion, or evidence of acute infarction. Vascular: No hyperdense vessels. Minimal atherosclerotic calcification of internal carotid and vertebral arteries at skull base Skull: Demineralized but intact Sinuses/Orbits: Clear Other: N/A CT CERVICAL SPINE FINDINGS Alignment: 2 mm anterolisthesis C6-C7 unchanged. Otherwise normal alignment. Thoracic kyphosis. Skull base and vertebrae: Osseous demineralization. Multilevel facet degenerative changes. Scattered mild disc space narrowing. Vertebral body heights maintained. No fracture or bone destruction. Soft tissues and spinal canal: Prevertebral soft tissues normal thickness. Atherosclerotic calcifications of the carotid bifurcations. Remaining cervical soft tissues unremarkable. Disc levels:  Unremarkable Upper chest: Minimal biapical scarring. Other: N/A IMPRESSION: Atrophy with small vessel chronic ischemic changes of deep cerebral white matter. No acute intracranial abnormalities. Osseous demineralization with degenerative facet disease changes of the cervical spine. No acute cervical spine abnormalities. Electronically Signed   By: Lavonia Dana M.D.   On: 02/19/2019 17:18   Dg Knee Complete 4 Views Right  Result Date: 02/19/2019 CLINICAL DATA:  Weakness fall.  Pain. EXAM: RIGHT KNEE - COMPLETE 4+ VIEW COMPARISON:  None. FINDINGS: No evidence of fracture, dislocation, or joint effusion. No evidence of arthropathy or other focal bone abnormality. Soft tissues are unremarkable. IMPRESSION: Negative. Electronically Signed   By: Dorise Bullion III M.D   On: 02/19/2019 16:57   Dg Hip Unilat With Pelvis 2-3 Views Right  Result Date: 02/19/2019 CLINICAL DATA:  Pain after fall. EXAM: DG HIP (WITH OR WITHOUT PELVIS) 2-3V RIGHT COMPARISON:  None. FINDINGS: There is a subcapital fracture so she aided with proximal right femur. The left hip is intact on the single frontal view. Pelvic bones otherwise normal. No dislocation. IMPRESSION: Impacted subcapital fracture in the proximal right femur. Electronically Signed   By: Dorise Bullion III M.D   On: 02/19/2019 16:56    EKG: Independently reviewed.  Sinus bradycardia heart rate around 51 bpm with nonspecific ST changes.  Assessment/Plan Principal Problem:   Closed displaced fracture of right femoral neck (HCC) Active Problems:   OBSTRUCTIVE SLEEP APNEA   Dementia (HCC)   Closed right hip fracture (HCC)    1. Right hip fracture status post mechanical fall -patient is at moderate risk for intermediate risk procedure.  We will keep patient n.p.o. past midnight in anticipation  of surgery pain relief medication. 2. History of dementia with no acute changes at this time.  CT head and C-spine were unremarkable. 3. Hyperlipidemia on statins. 4. Anemia normocytic normochromic appears to be chronic follow CBC. 5. History of hypertension and sleep apnea mentioned in the chart presently not on any medications.   DVT prophylaxis: SCDs. Code Status: Full code. Family Communication: I try to reach patient's healthcare power of attorney [facility] at [phone] but unable to reach.  Please contact in the morning. Disposition Plan: Probably will need rehab. Consults called: Orthopedics. Admission status: Inpatient.   Rise Patience MD [facility] Pager [phone].  If 7PM-7AM, please contact night-coverage [email or web address] Password [facility]  02/19/2019, 9:03 PM

## 2019-02-19 NOTE — ED Notes (Signed)
Bed: WA20 Expected date:  Expected time:  Means of arrival:  Comments: EMS/fall/hip pain

## 2019-02-19 NOTE — ED Notes (Signed)
[facility] dispatch notified for need of transport.  

## 2019-02-19 NOTE — ED Provider Notes (Signed)
[facility] DEPT Provider Note   CSN: [id number] Arrival date & time: 02/19/19  1505     History   Chief Complaint Chief Complaint  Patient presents with   Fall    witnessed   Hip Pain    right    HPI [name] [name] is a 85 y.o. male.     The history is provided by the EMS personnel, the nursing home and the patient. The history is limited by the condition of the patient (Hx dementia).  Fall  Hip Pain  Pt was seen at 1530.  Per EMS, NH report: Pt s/p fall at [facility]. NH states pt fell in front of the nurses' station and landed on his right side. Pt c/o right hip pain. No reported LOC, no AMS from baseline. Pt himself has hx of dementia. Pt denies CP/SOB, no abd pain, no vomiting/diarrhea.    Ortho Dr. Sharol Given Past Medical History:  Diagnosis Date   Allergic rhinitis, cause unspecified    Anxiety state, unspecified    Arthritis    Benign neoplasm of colon    Depressive disorder, not elsewhere classified    Esophageal reflux    Hyperlipidemia    Irritable bowel syndrome    Obstructive sleep apnea (adult) (pediatric)    Other chronic allergic conjunctivitis    Ulcer 1951   stomach    Patient Active Problem List   Diagnosis Date Noted   Evaluation by psychiatric service required    Hypoxia 02/13/2018   Edema 02/13/2018   Hypoxemia 02/13/2018   Dementia ([place]) 02/13/2018   Altered mental status 02/13/2018   Dermatitis 10/08/2015   CHEST PAIN, ATYPICAL 04/11/2010   Personal history of colon polyps and family history of colon cancer 12/19/2007   Anxiety state 12/19/2007   DEPRESSION 12/19/2007   OBSTRUCTIVE SLEEP APNEA 12/19/2007   ALLERGIC CONJUNCTIVITIS 12/19/2007   Seasonal allergic rhinitis 12/19/2007   GERD 12/19/2007   IBS 12/19/2007    Past Surgical History:  Procedure Laterality Date   APPENDECTOMY     CATARACT EXTRACTION W/ INTRAOCULAR LENS  IMPLANT, BILATERAL  2003   COLONOSCOPY       ESOPHAGOGASTRODUODENOSCOPY     TONSILLECTOMY          Home Medications    Prior to Admission medications   Medication Sig Start Date End Date Taking? Authorizing Provider  cetirizine (ZYRTEC) 10 MG tablet Take 10 mg by mouth daily. 09/20/18  Yes [provider]  Cholecalciferol (VITAMIN D) 2000 units tablet Take 1 tablet (2,000 Units total) by mouth daily. 03/08/18  Yes Mariel Aloe, MD  hydrOXYzine (ATARAX/VISTARIL) 25 MG tablet Take 1 tablet (25 mg total) by mouth 3 (three) times daily as needed for itching. 03/08/18  Yes Mariel Aloe, MD  pravastatin (PRAVACHOL) 10 MG tablet Take 1 tablet (10 mg total) by mouth daily. 03/09/18  Yes Mariel Aloe, MD  acetaminophen (TYLENOL) 325 MG tablet Take 2 tablets (650 mg total) by mouth every 6 (six) hours as needed for mild pain or headache. 03/08/18   Mariel Aloe, MD  doxycycline (VIBRA-TABS) 100 MG tablet Take 1 tablet (100 mg total) by mouth 2 (two) times daily. Patient not taking: Reported on 02/19/2019 04/06/18   Suzan Slick, NP    Family History Family History  Problem Relation Age of Onset   Colon cancer Father    Stroke Mother    Heart attack Mother    Colon cancer Mother  Rectal cancer Neg Hx    Stomach cancer Neg Hx     Social History Social History   Tobacco Use   Smoking status: Never Smoker   Smokeless tobacco: Never Used  Substance Use Topics   Alcohol use: No   Drug use: No     Allergies   Codeine, Penicillins, Ranitidine, and Amoxicillin   Review of Systems Review of Systems  Unable to perform ROS: Dementia     Physical Exam Updated Vital Signs BP 138/79 (BP Location: Left Arm)    Pulse (!) 55    Temp 97.8 F (36.6 C) (Oral)    Resp 18    Ht 6\' 2"  (1.88 m)    Wt 81.6 kg    SpO2 98%    BMI 23.11 kg/m   Physical Exam 1535: Physical examination:  Nursing notes reviewed; Vital signs and O2 SAT reviewed;  Constitutional: Well developed, Well nourished, Well hydrated, In no  acute distress; Head:  Normocephalic, atraumatic; Eyes: EOMI, PERRL, No scleral icterus; ENMT: Mouth and pharynx normal, Mucous membranes moist; Neck: Supple, Full range of motion, No lymphadenopathy; Cardiovascular: Regular rate and rhythm, No gallop; Respiratory: Breath sounds clear & equal bilaterally, No wheezes.  Speaking full sentences with ease, Normal respiratory effort/excursion; Chest: Nontender, Movement normal; Abdomen: Soft, Nontender, Nondistended, Normal bowel sounds; Genitourinary: No CVA tenderness; Spine:  No midline CS, TS, LS tenderness.;; Extremities: Peripheral pulses normal, Pelvis stable. +right hip and knee tender to palp with decreased ROM d/t pain. NT right ankle/foot. NMS intact distal right LE. +2 pedal edema bilat. No calf tenderness.; Neuro: Awake, alert, confused per hx dementia. No facial droop. Speech clear. +decreased ROM right hip and knee d/t pain, otherwise pt moves all extremities on stretcher spontaneously and to command without apparent gross focal motor deficits in extremities.; Skin: Color normal, Warm, Dry.    ED Treatments / Results  Labs (all labs ordered are listed, but only abnormal results are displayed)   EKG EKG Interpretation  Date/Time:  Sunday February 19 2019 17:48:03 EDT Ventricular Rate:  51 PR Interval:    QRS Duration: 108 QT Interval:  497 QTC Calculation: 458 R Axis:   42 Text Interpretation:  Sinus rhythm Prolonged PR interval Anteroseptal infarct, age indeterminate bse No old tracing to compare Confirmed by Francine Graven [phone]) on 02/19/2019 5:56:57 PM   Radiology   Procedures Procedures (including critical care time)  Medications Ordered in ED Medications - No data to display   Initial Impression / Assessment and Plan / ED Course  I have reviewed the triage vital signs and the nursing notes.  Pertinent labs & imaging results that were available during my care of the patient were reviewed by me and considered in my  medical decision making (see chart for details).     MDM Reviewed: previous chart, nursing note and vitals Reviewed previous: labs and ECG Interpretation: CT scan, x-ray, labs and ECG   Results for orders placed or performed during the hospital encounter of 02/19/19  SARS Coronavirus 2 (CEPHEID - Performed in [facility] hospital lab), [facility] Order   Specimen: Nasopharyngeal Swab  Result Value Ref Range   SARS Coronavirus 2 NEGATIVE NEGATIVE  Basic metabolic panel  Result Value Ref Range   Sodium 140 135 - 145 mmol/L   Potassium 3.8 3.5 - 5.1 mmol/L   Chloride 103 98 - 111 mmol/L   CO2 30 22 - 32 mmol/L   Glucose, Bld 98 70 - 99 mg/dL   BUN 22  8 - 23 mg/dL   Creatinine, Ser 0.78 0.61 - 1.24 mg/dL   Calcium 8.6 (L) 8.9 - 10.3 mg/dL   GFR calc non Af Amer >60 >60 mL/min   GFR calc Af Amer >60 >60 mL/min   Anion gap 7 5 - 15  Troponin I - Once  Result Value Ref Range   Troponin I <0.03 <0.03 ng/mL  CBC with Differential  Result Value Ref Range   WBC 4.9 4.0 - 10.5 K/uL   RBC 4.22 4.22 - 5.81 MIL/uL   Hemoglobin 12.6 (L) 13.0 - 17.0 g/dL   HCT 39.7 39.0 - 52.0 %   MCV 94.1 80.0 - 100.0 fL   MCH 29.9 26.0 - 34.0 pg   MCHC 31.7 30.0 - 36.0 g/dL   RDW 13.5 11.5 - 15.5 %   Platelets 159 150 - 400 K/uL   nRBC 0.0 0.0 - 0.2 %   Neutrophils Relative % 69 %   Neutro Abs 3.4 1.7 - 7.7 K/uL   Lymphocytes Relative 15 %   Lymphs Abs 0.8 0.7 - 4.0 K/uL   Monocytes Relative 12 %   Monocytes Absolute 0.6 0.1 - 1.0 K/uL   Eosinophils Relative 4 %   Eosinophils Absolute 0.2 0.0 - 0.5 K/uL   Basophils Relative 0 %   Basophils Absolute 0.0 0.0 - 0.1 K/uL   Immature Granulocytes 0 %   Abs Immature Granulocytes 0.01 0.00 - 0.07 K/uL  Protime-INR  Result Value Ref Range   Prothrombin Time 12.2 11.4 - 15.2 seconds   INR 0.9 0.8 - 1.2  Urinalysis, Routine w reflex microscopic  Result Value Ref Range   Color, Urine STRAW (A) YELLOW   APPearance CLEAR CLEAR   Specific Gravity, Urine  1.008 1.005 - 1.030   pH 7.0 5.0 - 8.0   Glucose, UA NEGATIVE NEGATIVE mg/dL   Hgb urine dipstick NEGATIVE NEGATIVE   Bilirubin Urine NEGATIVE NEGATIVE   Ketones, ur NEGATIVE NEGATIVE mg/dL   Protein, ur NEGATIVE NEGATIVE mg/dL   Nitrite NEGATIVE NEGATIVE   Leukocytes,Ua NEGATIVE NEGATIVE      Dg Chest 1 View Result Date: 02/19/2019 CLINICAL DATA:  Pain after fall EXAM: CHEST  1 VIEW COMPARISON:  February 13, 2018 FINDINGS: The heart size and mediastinal contours are within normal limits. Both lungs are clear. The visualized skeletal structures are unremarkable. IMPRESSION: No active disease. Electronically Signed   By: Dorise Bullion III M.D   On: 02/19/2019 16:58   Ct Head Wo Contrast Result Date: 02/19/2019 CLINICAL DATA:  Head trauma, no neurological decline, follow-up EXAM: CT HEAD WITHOUT CONTRAST CT CERVICAL SPINE WITHOUT CONTRAST TECHNIQUE: Multidetector CT imaging of the head and cervical spine was performed following the standard protocol without intravenous contrast. Multiplanar CT image reconstructions of the cervical spine were also generated. COMPARISON:  02/14/2018 CT head, cervical spine radiographs 06/09/2016 FINDINGS: CT HEAD FINDINGS Brain: Generalized atrophy. Normal ventricular morphology. No midline shift or mass effect. Small vessel chronic ischemic changes of deep cerebral white matter. Benign-appearing basal ganglia calcifications bilaterally. No intracranial hemorrhage, mass lesion, or evidence of acute infarction. Vascular: No hyperdense vessels. Minimal atherosclerotic calcification of internal carotid and vertebral arteries at skull base Skull: Demineralized but intact Sinuses/Orbits: Clear Other: N/A CT CERVICAL SPINE FINDINGS Alignment: 2 mm anterolisthesis C6-C7 unchanged. Otherwise normal alignment. Thoracic kyphosis. Skull base and vertebrae: Osseous demineralization. Multilevel facet degenerative changes. Scattered mild disc space narrowing. Vertebral body heights  maintained. No fracture or bone destruction. Soft tissues and spinal canal:  Prevertebral soft tissues normal thickness. Atherosclerotic calcifications of the carotid bifurcations. Remaining cervical soft tissues unremarkable. Disc levels:  Unremarkable Upper chest: Minimal biapical scarring. Other: N/A IMPRESSION: Atrophy with small vessel chronic ischemic changes of deep cerebral white matter. No acute intracranial abnormalities. Osseous demineralization with degenerative facet disease changes of the cervical spine. No acute cervical spine abnormalities. Electronically Signed   By: Lavonia Dana M.D.   On: 02/19/2019 17:18   Ct Cervical Spine Wo Contrast Result Date: 02/19/2019 CLINICAL DATA:  Head trauma, no neurological decline, follow-up EXAM: CT HEAD WITHOUT CONTRAST CT CERVICAL SPINE WITHOUT CONTRAST TECHNIQUE: Multidetector CT imaging of the head and cervical spine was performed following the standard protocol without intravenous contrast. Multiplanar CT image reconstructions of the cervical spine were also generated. COMPARISON:  02/14/2018 CT head, cervical spine radiographs 06/09/2016 FINDINGS: CT HEAD FINDINGS Brain: Generalized atrophy. Normal ventricular morphology. No midline shift or mass effect. Small vessel chronic ischemic changes of deep cerebral white matter. Benign-appearing basal ganglia calcifications bilaterally. No intracranial hemorrhage, mass lesion, or evidence of acute infarction. Vascular: No hyperdense vessels. Minimal atherosclerotic calcification of internal carotid and vertebral arteries at skull base Skull: Demineralized but intact Sinuses/Orbits: Clear Other: N/A CT CERVICAL SPINE FINDINGS Alignment: 2 mm anterolisthesis C6-C7 unchanged. Otherwise normal alignment. Thoracic kyphosis. Skull base and vertebrae: Osseous demineralization. Multilevel facet degenerative changes. Scattered mild disc space narrowing. Vertebral body heights maintained. No fracture or bone destruction.  Soft tissues and spinal canal: Prevertebral soft tissues normal thickness. Atherosclerotic calcifications of the carotid bifurcations. Remaining cervical soft tissues unremarkable. Disc levels:  Unremarkable Upper chest: Minimal biapical scarring. Other: N/A IMPRESSION: Atrophy with small vessel chronic ischemic changes of deep cerebral white matter. No acute intracranial abnormalities. Osseous demineralization with degenerative facet disease changes of the cervical spine. No acute cervical spine abnormalities. Electronically Signed   By: Lavonia Dana M.D.   On: 02/19/2019 17:18   Dg Knee Complete 4 Views Right Result Date: 02/19/2019 CLINICAL DATA:  Weakness fall.  Pain. EXAM: RIGHT KNEE - COMPLETE 4+ VIEW COMPARISON:  None. FINDINGS: No evidence of fracture, dislocation, or joint effusion. No evidence of arthropathy or other focal bone abnormality. Soft tissues are unremarkable. IMPRESSION: Negative. Electronically Signed   By: Dorise Bullion III M.D   On: 02/19/2019 16:57   Dg Hip Unilat With Pelvis 2-3 Views Right Result Date: 02/19/2019 CLINICAL DATA:  Pain after fall. EXAM: DG HIP (WITH OR WITHOUT PELVIS) 2-3V RIGHT COMPARISON:  None. FINDINGS: There is a subcapital fracture so she aided with proximal right femur. The left hip is intact on the single frontal view. Pelvic bones otherwise normal. No dislocation. IMPRESSION: Impacted subcapital fracture in the proximal right femur. Electronically Signed   By: Dorise Bullion III M.D   On: 02/19/2019 16:56    [name] [name] was evaluated in Emergency Department on 02/19/2019 for the symptoms described in the history of present illness. He was evaluated in the context of the global COVID-19 pandemic, which necessitated consideration that the patient might be at risk for infection with the SARS-CoV-2 virus that causes COVID-19. Institutional protocols and algorithms that pertain to the evaluation of patients at risk for COVID-19 are in a state of rapid  change based on information released by regulatory bodies including the CDC and federal and state organizations. These policies and algorithms were followed during the patient's care in the ED.    1735:  XR hip as above.  T/C returned from Ortho Dr. Marlou Sa, case discussed, including:  HPI, pertinent PM/SHx, VS/PE, dx testing, ED course and treatment:  Agreeable to consult, requests to admit to [facility] and transfer to [facility], NPO after midnight for likely OR tomorrow.  1925:  T/C returned from [facility] Dr. Hal Hope, case discussed, including:  HPI, pertinent PM/SHx, VS/PE, dx testing, ED course and treatment, as well as d/w Ortho MD:  Agreeable to admit.     Final Clinical Impressions(s) / ED Diagnoses   Final diagnoses:  None    ED Discharge Orders    None       Francine Graven, DO 02/23/19 1859

## 2019-02-19 NOTE — Progress Notes (Addendum)
I am aware of the patient and I will assume orthopedic care for my partner Dr. Marlou Sa.  I have spoken to the patient's healthcare power of attorney [name] [name] whose contact number is [phone].  I have discussed orthopedic findings and treatment plan for surgical fixation of right hip fracture Monday.  Risks and benefits were reviewed an he has provided consent for me to proceed with surgery tomorrow.  N.p.o. after midnight.

## 2019-02-19 NOTE — ED Triage Notes (Signed)
Pt here via EMS, Pt from [place] care unit, hx dementia. Pt at baseline orientation per facility. Witnessed fall at nurses station, pt fell on right side, now C/O right hip pain. Pt did not hit head,  No LOC. Right hip tender to touch.  Not on blood thinners.

## 2019-02-19 NOTE — ED Notes (Signed)
ED TO INPATIENT HANDOFF REPORT  Name/Age/Gender [name] [name] 85 y.o. male  Code Status Code Status History    Date Active Date Inactive Code Status Order ID Comments User Context   02/14/2018 0005 03/08/2018 1557 Full Code [id number]  Elwyn Reach, MD Inpatient   Advance Care Planning Activity      Home/SNF/Other Nursing Home  Chief Complaint fall; hip injury  Level of Care/Admitting Diagnosis ED Disposition    ED Disposition Condition [facility] Area: [place] [100100]  Level of Care: Telemetry Medical [104]  Covid Evaluation: Screening Protocol (No Symptoms)  Diagnosis: Hip fracture [facility]) [[id number]]  Admitting Physician: Rise Patience [phone]  Attending Physician: Rise Patience [phone]  Estimated length of stay: past midnight tomorrow  Certification:: I certify this patient will need inpatient services for at least 2 midnights  PT Class (Do Not Modify): Inpatient [101]  PT Acc Code (Do Not Modify): Private [1]       Medical History Past Medical History:  Diagnosis Date  . Allergic rhinitis, cause unspecified   . Anxiety state, unspecified   . Arthritis   . Benign neoplasm of colon   . Depressive disorder, not elsewhere classified   . Esophageal reflux   . Hyperlipidemia   . Irritable bowel syndrome   . Obstructive sleep apnea (adult) (pediatric)   . Other chronic allergic conjunctivitis   . Ulcer 1951   stomach    Allergies Allergies  Allergen Reactions  . Codeine     Other reaction(s): Unknown  . Penicillins Rash  . Ranitidine     Other reaction(s): Unknown  . Amoxicillin Rash    IV Location/Drains/Wounds Patient Lines/Drains/Airways Status   Active Line/Drains/Airways    None          Labs/Imaging Results for orders placed or performed during the hospital encounter of 02/19/19 (from the past 48 hour(s))  Urinalysis, Routine w reflex microscopic     Status: Abnormal   Collection Time:  02/19/19  5:08 PM  Result Value Ref Range   Color, Urine STRAW (A) YELLOW   APPearance CLEAR CLEAR   Specific Gravity, Urine 1.008 1.005 - 1.030   pH 7.0 5.0 - 8.0   Glucose, UA NEGATIVE NEGATIVE mg/dL   Hgb urine dipstick NEGATIVE NEGATIVE   Bilirubin Urine NEGATIVE NEGATIVE   Ketones, ur NEGATIVE NEGATIVE mg/dL   Protein, ur NEGATIVE NEGATIVE mg/dL   Nitrite NEGATIVE NEGATIVE   Leukocytes,Ua NEGATIVE NEGATIVE    Comment: Performed at [facility] [address] [place], [place] [address]  Basic metabolic panel     Status: Abnormal   Collection Time: 02/19/19  5:52 PM  Result Value Ref Range   Sodium 140 135 - 145 mmol/L   Potassium 3.8 3.5 - 5.1 mmol/L   Chloride 103 98 - 111 mmol/L   CO2 30 22 - 32 mmol/L   Glucose, Bld 98 70 - 99 mg/dL   BUN 22 8 - 23 mg/dL   Creatinine, Ser 0.78 0.61 - 1.24 mg/dL   Calcium 8.6 (L) 8.9 - 10.3 mg/dL   GFR calc non Af Amer >60 >60 mL/min   GFR calc Af Amer >60 >60 mL/min   Anion gap 7 5 - 15    Comment: Performed at [facility], [place] [address], [place], [place] [address]  Troponin I - Once     Status: None   Collection Time: 02/19/19  5:52 PM  Result Value  Ref Range   Troponin I <0.03 <0.03 ng/mL    Comment: Performed at [facility], [place] [address], [place], [place] [address]  CBC with Differential     Status: Abnormal   Collection Time: 02/19/19  5:52 PM  Result Value Ref Range   WBC 4.9 4.0 - 10.5 K/uL   RBC 4.22 4.22 - 5.81 MIL/uL   Hemoglobin 12.6 (L) 13.0 - 17.0 g/dL   HCT 39.7 39.0 - 52.0 %   MCV 94.1 80.0 - 100.0 fL   MCH 29.9 26.0 - 34.0 pg   MCHC 31.7 30.0 - 36.0 g/dL   RDW 13.5 11.5 - 15.5 %   Platelets 159 150 - 400 K/uL   nRBC 0.0 0.0 - 0.2 %   Neutrophils Relative % 69 %   Neutro Abs 3.4 1.7 - 7.7 K/uL   Lymphocytes Relative 15 %   Lymphs Abs 0.8 0.7 - 4.0 K/uL   Monocytes Relative 12 %   Monocytes Absolute 0.6 0.1 - 1.0 K/uL   Eosinophils Relative 4 %    Eosinophils Absolute 0.2 0.0 - 0.5 K/uL   Basophils Relative 0 %   Basophils Absolute 0.0 0.0 - 0.1 K/uL   Immature Granulocytes 0 %   Abs Immature Granulocytes 0.01 0.00 - 0.07 K/uL    Comment: Performed at [facility], [place] [address], [place], [place] [address]  Protime-INR     Status: None   Collection Time: 02/19/19  5:52 PM  Result Value Ref Range   Prothrombin Time 12.2 11.4 - 15.2 seconds   INR 0.9 0.8 - 1.2    Comment: (NOTE) INR goal varies based on device and disease states. Performed at [facility], [place] [address] [place], [place] [address]   SARS Coronavirus 2 (CEPHEID - Performed in [facility] hospital lab), Hosp Order     Status: None   Collection Time: 02/19/19  6:44 PM   Specimen: Nasopharyngeal Swab  Result Value Ref Range   SARS Coronavirus 2 NEGATIVE NEGATIVE    Comment: (NOTE) If result is NEGATIVE SARS-CoV-2 target nucleic acids are NOT DETECTED. The SARS-CoV-2 RNA is generally detectable in upper and lower  respiratory specimens during the acute phase of infection. The lowest  concentration of SARS-CoV-2 viral copies this assay can detect is 250  copies / mL. A negative result does not preclude SARS-CoV-2 infection  and should not be used as the sole basis for treatment or other  patient management decisions.  A negative result may occur with  improper specimen collection / handling, submission of specimen other  than nasopharyngeal swab, presence of viral mutation(s) within the  areas targeted by this assay, and inadequate number of viral copies  (<250 copies / mL). A negative result must be combined with clinical  observations, patient history, and epidemiological information. If result is POSITIVE SARS-CoV-2 target nucleic acids are DETECTED. The SARS-CoV-2 RNA is generally detectable in upper and lower  respiratory specimens dur ing the acute phase of infection.  Positive  results are indicative of active  infection with SARS-CoV-2.  Clinical  correlation with patient history and other diagnostic information is  necessary to determine patient infection status.  Positive results do  not rule out bacterial infection or co-infection with other viruses. If result is PRESUMPTIVE POSTIVE SARS-CoV-2 nucleic acids MAY BE PRESENT.   A presumptive positive result was obtained on the submitted specimen  and confirmed on repeat testing.  While 2019 novel coronavirus  (SARS-CoV-2)  nucleic acids may be present in the submitted sample  additional confirmatory testing may be necessary for epidemiological  and / or clinical management purposes  to differentiate between  SARS-CoV-2 and other Sarbecovirus currently known to infect humans.  If clinically indicated additional testing with an alternate test  methodology [phone]) is advised. The SARS-CoV-2 RNA is generally  detectable in upper and lower respiratory sp ecimens during the acute  phase of infection. The expected result is Negative. Fact Sheet for Patients:  StrictlyIdeas.no Fact Sheet for Healthcare Providers: BankingDealers.co.za This test is not yet approved or cleared by the [place] FDA and has been authorized for detection and/or diagnosis of SARS-CoV-2 by FDA under an Emergency Use Authorization (EUA).  This EUA will remain in effect (meaning this test can be used) for the duration of the COVID-19 declaration under Section 564(b)(1) of the Act, 21 U.S.C. section 360bbb-3(b)(1), unless the authorization is terminated or revoked sooner. Performed at [facility], [place] [address], [place], [place] [address]    Dg Chest 1 View  Result Date: 02/19/2019 CLINICAL DATA:  Pain after fall EXAM: CHEST  1 VIEW COMPARISON:  February 13, 2018 FINDINGS: The heart size and mediastinal contours are within normal limits. Both lungs are clear. The visualized skeletal structures are  unremarkable. IMPRESSION: No active disease. Electronically Signed   By: Dorise Bullion III M.D   On: 02/19/2019 16:58   Ct Head Wo Contrast  Result Date: 02/19/2019 CLINICAL DATA:  Head trauma, no neurological decline, follow-up EXAM: CT HEAD WITHOUT CONTRAST CT CERVICAL SPINE WITHOUT CONTRAST TECHNIQUE: Multidetector CT imaging of the head and cervical spine was performed following the standard protocol without intravenous contrast. Multiplanar CT image reconstructions of the cervical spine were also generated. COMPARISON:  02/14/2018 CT head, cervical spine radiographs 06/09/2016 FINDINGS: CT HEAD FINDINGS Brain: Generalized atrophy. Normal ventricular morphology. No midline shift or mass effect. Small vessel chronic ischemic changes of deep cerebral white matter. Benign-appearing basal ganglia calcifications bilaterally. No intracranial hemorrhage, mass lesion, or evidence of acute infarction. Vascular: No hyperdense vessels. Minimal atherosclerotic calcification of internal carotid and vertebral arteries at skull base Skull: Demineralized but intact Sinuses/Orbits: Clear Other: N/A CT CERVICAL SPINE FINDINGS Alignment: 2 mm anterolisthesis C6-C7 unchanged. Otherwise normal alignment. Thoracic kyphosis. Skull base and vertebrae: Osseous demineralization. Multilevel facet degenerative changes. Scattered mild disc space narrowing. Vertebral body heights maintained. No fracture or bone destruction. Soft tissues and spinal canal: Prevertebral soft tissues normal thickness. Atherosclerotic calcifications of the carotid bifurcations. Remaining cervical soft tissues unremarkable. Disc levels:  Unremarkable Upper chest: Minimal biapical scarring. Other: N/A IMPRESSION: Atrophy with small vessel chronic ischemic changes of deep cerebral white matter. No acute intracranial abnormalities. Osseous demineralization with degenerative facet disease changes of the cervical spine. No acute cervical spine abnormalities.  Electronically Signed   By: Lavonia Dana M.D.   On: 02/19/2019 17:18   Ct Cervical Spine Wo Contrast  Result Date: 02/19/2019 CLINICAL DATA:  Head trauma, no neurological decline, follow-up EXAM: CT HEAD WITHOUT CONTRAST CT CERVICAL SPINE WITHOUT CONTRAST TECHNIQUE: Multidetector CT imaging of the head and cervical spine was performed following the standard protocol without intravenous contrast. Multiplanar CT image reconstructions of the cervical spine were also generated. COMPARISON:  02/14/2018 CT head, cervical spine radiographs 06/09/2016 FINDINGS: CT HEAD FINDINGS Brain: Generalized atrophy. Normal ventricular morphology. No midline shift or mass effect. Small vessel chronic ischemic changes of deep cerebral white matter. Benign-appearing basal ganglia calcifications bilaterally. No intracranial hemorrhage, mass lesion, or  evidence of acute infarction. Vascular: No hyperdense vessels. Minimal atherosclerotic calcification of internal carotid and vertebral arteries at skull base Skull: Demineralized but intact Sinuses/Orbits: Clear Other: N/A CT CERVICAL SPINE FINDINGS Alignment: 2 mm anterolisthesis C6-C7 unchanged. Otherwise normal alignment. Thoracic kyphosis. Skull base and vertebrae: Osseous demineralization. Multilevel facet degenerative changes. Scattered mild disc space narrowing. Vertebral body heights maintained. No fracture or bone destruction. Soft tissues and spinal canal: Prevertebral soft tissues normal thickness. Atherosclerotic calcifications of the carotid bifurcations. Remaining cervical soft tissues unremarkable. Disc levels:  Unremarkable Upper chest: Minimal biapical scarring. Other: N/A IMPRESSION: Atrophy with small vessel chronic ischemic changes of deep cerebral white matter. No acute intracranial abnormalities. Osseous demineralization with degenerative facet disease changes of the cervical spine. No acute cervical spine abnormalities. Electronically Signed   By: Lavonia Dana M.D.    On: 02/19/2019 17:18   Dg Knee Complete 4 Views Right  Result Date: 02/19/2019 CLINICAL DATA:  Weakness fall.  Pain. EXAM: RIGHT KNEE - COMPLETE 4+ VIEW COMPARISON:  None. FINDINGS: No evidence of fracture, dislocation, or joint effusion. No evidence of arthropathy or other focal bone abnormality. Soft tissues are unremarkable. IMPRESSION: Negative. Electronically Signed   By: Dorise Bullion III M.D   On: 02/19/2019 16:57   Dg Hip Unilat With Pelvis 2-3 Views Right  Result Date: 02/19/2019 CLINICAL DATA:  Pain after fall. EXAM: DG HIP (WITH OR WITHOUT PELVIS) 2-3V RIGHT COMPARISON:  None. FINDINGS: There is a subcapital fracture so she aided with proximal right femur. The left hip is intact on the single frontal view. Pelvic bones otherwise normal. No dislocation. IMPRESSION: Impacted subcapital fracture in the proximal right femur. Electronically Signed   By: Dorise Bullion III M.D   On: 02/19/2019 16:56    Pending Labs Unresulted Labs (From admission, onward)   None      Vitals/Pain Today's Vitals   02/19/19 1754 02/19/19 1854 02/19/19 1900 02/19/19 1930  BP: (!) 143/100 (!) 161/82 (!) 155/96 (!) 155/80  Pulse: (!) 50 72 67 60  Resp: 18 18 18 18   Temp:      TempSrc:      SpO2: 100% 98% 95% 95%  Weight:      Height:      PainSc:        Isolation Precautions No active isolations  Medications Medications  morphine 2 MG/ML injection 2 mg (2 mg Intravenous Given 02/19/19 1839)    Mobility walks with device

## 2019-02-20 ENCOUNTER — Encounter (HOSPITAL_COMMUNITY)
Admission: EM | Disposition: A | Discharge status: Skilled Nursing Facility | Payer: Self-pay | Source: Skilled Nursing Facility | Attending: Internal Medicine

## 2019-02-20 ENCOUNTER — Inpatient Hospital Stay (HOSPITAL_COMMUNITY): Payer: Medicare Other | Admitting: Certified Registered Nurse Anesthetist

## 2019-02-20 ENCOUNTER — Encounter (HOSPITAL_COMMUNITY): Payer: Self-pay | Admitting: Certified Registered Nurse Anesthetist

## 2019-02-20 ENCOUNTER — Inpatient Hospital Stay (HOSPITAL_COMMUNITY): Payer: Medicare Other

## 2019-02-20 DIAGNOSIS — S72001A Fracture of unspecified part of neck of right femur, initial encounter for closed fracture: Secondary | ICD-10-CM

## 2019-02-20 HISTORY — PX: HIP PINNING,CANNULATED: SHX1758

## 2019-02-20 LAB — BASIC METABOLIC PANEL
Anion gap: 7 (ref 5–15)
BUN: 15 mg/dL (ref 8–23)
CO2: 29 mmol/L (ref 22–32)
Calcium: 8.5 mg/dL — ABNORMAL LOW (ref 8.9–10.3)
Chloride: 101 mmol/L (ref 98–111)
Creatinine, Ser: 0.7 mg/dL (ref 0.61–1.24)
GFR calc Af Amer: >60 mL/min (ref >60)
GFR calc non Af Amer: >60 mL/min (ref >60)
Glucose, Bld: 122 mg/dL — ABNORMAL HIGH (ref 70–99)
Potassium: 3.6 mmol/L (ref 3.5–5.1)
Sodium: 137 mmol/L (ref 135–145)

## 2019-02-20 LAB — SURGICAL PCR SCREEN
MRSA, PCR: NEGATIVE
Staphylococcus aureus: POSITIVE — AB

## 2019-02-20 LAB — CBC
HCT: 35.5 % — ABNORMAL LOW (ref 39.0–52.0)
Hemoglobin: 11.7 g/dL — ABNORMAL LOW (ref 13.0–17.0)
MCH: 29.8 pg (ref 26.0–34.0)
MCHC: 33 g/dL (ref 30.0–36.0)
MCV: 90.3 fL (ref 80.0–100.0)
Platelets: 153 10*3/uL (ref 150–400)
RBC: 3.93 MIL/uL — ABNORMAL LOW (ref 4.22–5.81)
RDW: 13.1 % (ref 11.5–15.5)
WBC: 6.9 10*3/uL (ref 4.0–10.5)
nRBC: 0 % (ref 0.0–0.2)

## 2019-02-20 LAB — GLUCOSE, CAPILLARY: Glucose-Capillary: 92 mg/dL (ref 70–99)

## 2019-02-20 SURGERY — FIXATION, FEMUR, NECK, PERCUTANEOUS, USING SCREW
Anesthesia: General | Site: Hip | Laterality: Right

## 2019-02-20 SURGERY — PINNING, EXTREMITY, PERCUTANEOUS
Anesthesia: General | Site: Hip | Laterality: Right

## 2019-02-20 MED ORDER — ENSURE ENLIVE PO LIQD
237.0000 mL | Freq: Two times a day (BID) | ORAL | Status: DC
  Administered 2019-02-21 – 2019-02-24 (×8): 237 mL via ORAL

## 2019-02-20 MED ORDER — LIDOCAINE 2% (20 MG/ML) 5 ML SYRINGE
INTRAMUSCULAR | Status: DC | PRN
  Administered 2019-02-20: 40 mg via INTRAVENOUS

## 2019-02-20 MED ORDER — ACETAMINOPHEN 500 MG PO TABS
500.0000 mg | ORAL_TABLET | Freq: Four times a day (QID) | ORAL | Status: AC
  Administered 2019-02-20 – 2019-02-21 (×3): 500 mg via ORAL
  Filled 2019-02-20 (×3): qty 1

## 2019-02-20 MED ORDER — FENTANYL CITRATE (PF) 100 MCG/2ML IJ SOLN
INTRAMUSCULAR | Status: DC | PRN
  Administered 2019-02-20 (×2): 50 ug via INTRAVENOUS

## 2019-02-20 MED ORDER — PHENYLEPHRINE 40 MCG/ML (10ML) SYRINGE FOR IV PUSH (FOR BLOOD PRESSURE SUPPORT)
PREFILLED_SYRINGE | INTRAVENOUS | Status: AC
  Filled 2019-02-20: qty 10

## 2019-02-20 MED ORDER — PROPOFOL 10 MG/ML IV BOLUS
INTRAVENOUS | Status: AC
  Filled 2019-02-20: qty 20

## 2019-02-20 MED ORDER — ALUM & MAG HYDROXIDE-SIMETH 200-200-20 MG/5ML PO SUSP: 30.0000 mL | ORAL | Status: DC | PRN

## 2019-02-20 MED ORDER — GLYCOPYRROLATE PF 0.2 MG/ML IJ SOSY
PREFILLED_SYRINGE | INTRAMUSCULAR | Status: DC | PRN
  Administered 2019-02-20: .2 mg via INTRAVENOUS

## 2019-02-20 MED ORDER — CEFAZOLIN SODIUM-DEXTROSE 2-4 GM/100ML-% IV SOLN
2.0000 g | Freq: Four times a day (QID) | INTRAVENOUS | Status: AC
  Administered 2019-02-20 – 2019-02-21 (×2): 2 g via INTRAVENOUS
  Filled 2019-02-20 (×3): qty 100

## 2019-02-20 MED ORDER — DEXAMETHASONE SODIUM PHOSPHATE 10 MG/ML IJ SOLN
INTRAMUSCULAR | Status: DC | PRN
  Administered 2019-02-20: 5 mg via INTRAVENOUS

## 2019-02-20 MED ORDER — 0.9 % SODIUM CHLORIDE (POUR BTL) OPTIME
TOPICAL | Status: DC | PRN
  Administered 2019-02-20: 14:00:00 1000 mL

## 2019-02-20 MED ORDER — FENTANYL CITRATE (PF) 250 MCG/5ML IJ SOLN
INTRAMUSCULAR | Status: AC
  Filled 2019-02-20: qty 5

## 2019-02-20 MED ORDER — ACETAMINOPHEN 325 MG PO TABS: 325.0000 mg | ORAL_TABLET | Freq: Four times a day (QID) | ORAL | Status: DC | PRN

## 2019-02-20 MED ORDER — ENOXAPARIN SODIUM 40 MG/0.4ML ~~LOC~~ SOLN
40.0000 mg | SUBCUTANEOUS | Status: DC
  Administered 2019-02-21 – 2019-02-24 (×4): 40 mg via SUBCUTANEOUS
  Filled 2019-02-20 (×4): qty 0.4

## 2019-02-20 MED ORDER — METHOCARBAMOL 500 MG PO TABS
500.0000 mg | ORAL_TABLET | Freq: Four times a day (QID) | ORAL | Status: DC | PRN
  Administered 2019-02-21 – 2019-02-22 (×2): 500 mg via ORAL
  Filled 2019-02-20 (×2): qty 1

## 2019-02-20 MED ORDER — ONDANSETRON HCL 4 MG/2ML IJ SOLN: 4.0000 mg | Freq: Four times a day (QID) | INTRAMUSCULAR | Status: DC | PRN

## 2019-02-20 MED ORDER — ONDANSETRON HCL 4 MG/2ML IJ SOLN
INTRAMUSCULAR | Status: AC
  Filled 2019-02-20: qty 2

## 2019-02-20 MED ORDER — EPHEDRINE 5 MG/ML INJ
INTRAVENOUS | Status: AC
  Filled 2019-02-20: qty 10

## 2019-02-20 MED ORDER — PROPOFOL 10 MG/ML IV BOLUS
INTRAVENOUS | Status: DC | PRN
  Administered 2019-02-20: 100 mg via INTRAVENOUS

## 2019-02-20 MED ORDER — LACTATED RINGERS IV SOLN
INTRAVENOUS | Status: DC | PRN
  Administered 2019-02-20: 14:00:00 via INTRAVENOUS

## 2019-02-20 MED ORDER — MENTHOL 3 MG MT LOZG: 1.0000 | LOZENGE | OROMUCOSAL | Status: DC | PRN

## 2019-02-20 MED ORDER — HYDROCODONE-ACETAMINOPHEN 5-325 MG PO TABS
1.0000 | ORAL_TABLET | ORAL | Status: DC | PRN
  Administered 2019-02-20 – 2019-02-23 (×6): 2 via ORAL
  Filled 2019-02-20 (×6): qty 2

## 2019-02-20 MED ORDER — PHENYLEPHRINE 40 MCG/ML (10ML) SYRINGE FOR IV PUSH (FOR BLOOD PRESSURE SUPPORT)
PREFILLED_SYRINGE | INTRAVENOUS | Status: DC | PRN
  Administered 2019-02-20 (×2): 80 ug via INTRAVENOUS

## 2019-02-20 MED ORDER — HYDROCODONE-ACETAMINOPHEN 7.5-325 MG PO TABS
1.0000 | ORAL_TABLET | ORAL | Status: DC | PRN
  Administered 2019-02-21 – 2019-02-24 (×3): 2 via ORAL
  Filled 2019-02-20 (×3): qty 2

## 2019-02-20 MED ORDER — ADULT MULTIVITAMIN W/MINERALS CH
1.0000 | ORAL_TABLET | Freq: Every day | ORAL | Status: DC
  Administered 2019-02-21 – 2019-02-24 (×4): 1 via ORAL
  Filled 2019-02-20 (×4): qty 1

## 2019-02-20 MED ORDER — ONDANSETRON HCL 4 MG/2ML IJ SOLN
INTRAMUSCULAR | Status: DC | PRN
  Administered 2019-02-20: 4 mg via INTRAVENOUS

## 2019-02-20 MED ORDER — DEXAMETHASONE SODIUM PHOSPHATE 10 MG/ML IJ SOLN
INTRAMUSCULAR | Status: AC
  Filled 2019-02-20: qty 1

## 2019-02-20 MED ORDER — LIDOCAINE 2% (20 MG/ML) 5 ML SYRINGE
INTRAMUSCULAR | Status: AC
  Filled 2019-02-20: qty 5

## 2019-02-20 MED ORDER — EPHEDRINE SULFATE-NACL 50-0.9 MG/10ML-% IV SOSY
PREFILLED_SYRINGE | INTRAVENOUS | Status: DC | PRN
  Administered 2019-02-20 (×3): 10 mg via INTRAVENOUS

## 2019-02-20 MED ORDER — PHENOL 1.4 % MT LIQD: 1.0000 | OROMUCOSAL | Status: DC | PRN

## 2019-02-20 MED ORDER — MAGNESIUM CITRATE PO SOLN
1.0000 | Freq: Once | ORAL | Status: AC | PRN
  Administered 2019-02-22: 1 via ORAL
  Filled 2019-02-20: qty 296

## 2019-02-20 MED ORDER — SUCCINYLCHOLINE CHLORIDE 200 MG/10ML IV SOSY
PREFILLED_SYRINGE | INTRAVENOUS | Status: AC
  Filled 2019-02-20: qty 10

## 2019-02-20 MED ORDER — SORBITOL 70 % SOLN: 30.0000 mL | Freq: Every day | Status: DC | PRN

## 2019-02-20 MED ORDER — SODIUM CHLORIDE 0.9 % IV SOLN
INTRAVENOUS | Status: DC
  Administered 2019-02-20 – 2019-02-21 (×2): via INTRAVENOUS

## 2019-02-20 MED ORDER — MORPHINE SULFATE (PF) 2 MG/ML IV SOLN
1.0000 mg | INTRAVENOUS | Status: DC | PRN
  Administered 2019-02-21 – 2019-02-22 (×2): 1 mg via INTRAVENOUS
  Filled 2019-02-20 (×2): qty 1

## 2019-02-20 MED ORDER — METHOCARBAMOL 1000 MG/10ML IJ SOLN
500.0000 mg | Freq: Four times a day (QID) | INTRAVENOUS | Status: DC | PRN
  Filled 2019-02-20: qty 5

## 2019-02-20 MED ORDER — GLYCOPYRROLATE PF 0.2 MG/ML IJ SOSY
PREFILLED_SYRINGE | INTRAMUSCULAR | Status: AC
  Filled 2019-02-20: qty 1

## 2019-02-20 MED ORDER — ONDANSETRON HCL 4 MG PO TABS: 4.0000 mg | ORAL_TABLET | Freq: Four times a day (QID) | ORAL | Status: DC | PRN

## 2019-02-20 MED ORDER — MUPIROCIN 2 % EX OINT
1.0000 "application " | TOPICAL_OINTMENT | Freq: Two times a day (BID) | CUTANEOUS | Status: DC
  Administered 2019-02-20: 1 via TOPICAL
  Filled 2019-02-20: qty 22

## 2019-02-20 MED ORDER — DOCUSATE SODIUM 100 MG PO CAPS
100.0000 mg | ORAL_CAPSULE | Freq: Two times a day (BID) | ORAL | Status: DC
  Administered 2019-02-20 – 2019-02-24 (×8): 100 mg via ORAL
  Filled 2019-02-20 (×8): qty 1

## 2019-02-20 MED ORDER — POLYETHYLENE GLYCOL 3350 17 G PO PACK
17.0000 g | PACK | Freq: Every day | ORAL | Status: DC | PRN
  Administered 2019-02-22 – 2019-02-24 (×2): 17 g via ORAL
  Filled 2019-02-20 (×2): qty 1

## 2019-02-20 SURGICAL SUPPLY — 38 items
BIT DRILL 5.0 QC 6.5 (BIT) ×1 IMPLANT
BIT DRILL 5.0 QC 6.5MM (BIT) ×1
BNDG COHESIVE 4X5 TAN STRL (GAUZE/BANDAGES/DRESSINGS) ×7 IMPLANT
BNDG GAUZE ELAST 4 BULKY (GAUZE/BANDAGES/DRESSINGS) ×3 IMPLANT
COVER PERINEAL POST (MISCELLANEOUS) ×3 IMPLANT
COVER WAND RF STERILE (DRAPES) ×3 IMPLANT
DRAPE STERI IOBAN 125X83 (DRAPES) ×3 IMPLANT
DRSG ADAPTIC 3X8 NADH LF (GAUZE/BANDAGES/DRESSINGS) ×3 IMPLANT
DRSG MEPILEX BORDER 4X4 (GAUZE/BANDAGES/DRESSINGS) ×3 IMPLANT
DURAPREP 26ML APPLICATOR (WOUND CARE) ×3 IMPLANT
ELECT CAUTERY BLADE 6.4 (BLADE) ×1 IMPLANT
ELECT REM PT RETURN 9FT ADLT (ELECTROSURGICAL)
ELECTRODE REM PT RTRN 9FT ADLT (ELECTROSURGICAL) ×1 IMPLANT
GAUZE XEROFORM 1X8 LF (GAUZE/BANDAGES/DRESSINGS) ×2 IMPLANT
GLOVE BIOGEL PI IND STRL 7.0 (GLOVE) ×1 IMPLANT
GLOVE BIOGEL PI INDICATOR 7.0 (GLOVE) ×2
GLOVE ECLIPSE 7.0 STRL STRAW (GLOVE) ×3 IMPLANT
GLOVE SKINSENSE NS SZ7.5 (GLOVE) ×4
GLOVE SKINSENSE STRL SZ7.5 (GLOVE) ×2 IMPLANT
GOWN STRL REIN XL XLG (GOWN DISPOSABLE) ×9 IMPLANT
KIT BASIN OR (CUSTOM PROCEDURE TRAY) ×3 IMPLANT
KIT TURNOVER KIT B (KITS) ×3 IMPLANT
LINER BOOT UNIVERSAL DISP (MISCELLANEOUS) ×3 IMPLANT
MANIFOLD NEPTUNE II (INSTRUMENTS) ×1 IMPLANT
NS IRRIG 1000ML POUR BTL (IV SOLUTION) ×3 IMPLANT
PACK GENERAL/GYN (CUSTOM PROCEDURE TRAY) ×3 IMPLANT
PAD ARMBOARD 7.5X6 YLW CONV (MISCELLANEOUS) ×10 IMPLANT
PIN GUIDE 3.2X300MM (PIN) ×6 IMPLANT
SCREW CANN 7.0X105 16TD (Screw) ×4 IMPLANT
SCREW CANN SBF FT SD 7X110 (Screw) ×2 IMPLANT
STAPLER VISISTAT 35W (STAPLE) ×2 IMPLANT
SUT VIC AB 0 CT1 27 (SUTURE) ×3
SUT VIC AB 0 CT1 27XBRD ANBCTR (SUTURE) ×1 IMPLANT
SUT VIC AB 2-0 CT1 27 (SUTURE) ×3
SUT VIC AB 2-0 CT1 TAPERPNT 27 (SUTURE) ×1 IMPLANT
TOWEL OR 17X24 6PK STRL BLUE (TOWEL DISPOSABLE) ×3 IMPLANT
TOWEL OR 17X26 10 PK STRL BLUE (TOWEL DISPOSABLE) ×3 IMPLANT
WATER STERILE IRR 1000ML POUR (IV SOLUTION) ×1 IMPLANT

## 2019-02-20 NOTE — Transfer of Care (Signed)
Immediate Anesthesia Transfer of Care Note  Patient: [name] [name]  Procedure(s) Performed: CANNULATED HIP PINNING (Right Hip)  Patient Location: PACU  Anesthesia Type:General  Level of Consciousness: awake, alert  and oriented  Airway & Oxygen Therapy: Patient Spontanous Breathing and Patient connected to face mask oxygen  Post-op Assessment: Report given to RN and Post -op Vital signs reviewed and stable  Post vital signs: Reviewed and stable  Last Vitals:  Vitals Value Taken Time  BP 134/76   Temp    Pulse 68   Resp 10   SpO2 100     Last Pain:  Vitals:   02/20/19 0633  TempSrc: Oral  PainSc:          Complications: No apparent anesthesia complications

## 2019-02-20 NOTE — Anesthesia Procedure Notes (Signed)
Procedure Name: Intubation Date/Time: 02/20/2019 2:00 PM Performed by: Alain Marion, CRNA Pre-anesthesia Checklist: Patient identified, Emergency Drugs available, Suction available and Patient being monitored Patient Re-evaluated:Patient Re-evaluated prior to induction Oxygen Delivery Method: Circle System Utilized Preoxygenation: Pre-oxygenation with 100% oxygen Induction Type: IV induction and Rapid sequence Laryngoscope Size: 2 and Miller Grade View: Grade I Tube type: Oral Tube size: 7.5 mm Number of attempts: 1 Airway Equipment and Method: Stylet and Oral airway Placement Confirmation: ETT inserted through vocal cords under direct vision,  positive ETCO2 and breath sounds checked- equal and bilateral Secured at: 22 cm Tube secured with: Tape Dental Injury: Teeth and Oropharynx as per pre-operative assessment

## 2019-02-20 NOTE — Op Note (Signed)
   Date of Surgery: 02/20/2019  INDICATIONS: Mr. Encina is a 85 y.o.-year-old male who sustained a hip fracture; he was indicated for open reduction and internal fixation due to the displaced nature of the fracture and came to the operating room today for this procedure. The patient's healthcare POA did consent to the procedure after discussion of the risks and benefits.   PREOPERATIVE DIAGNOSIS: right valgus impacted subcapital femoral neck fracture  POSTOPERATIVE DIAGNOSIS: Same.  PROCEDURE: Open treatment of proximal end of femur, neck with internal fixation. CPT [phone].  SURGEON: N. Eduard Roux, M.D.  ASSIST: Ciro Backer [place], [place]; necessary for the timely completion of procedure and due to complexity of procedure.  ANESTHESIA: general  IV FLUIDS AND URINE: See anesthesia.  ESTIMATED BLOOD LOSS: minimal mL.  IMPLANTS: Smith [employer] Nephew 7.0 cannulated screws x 3  DRAINS: None.  COMPLICATIONS: see description of procedure.  DESCRIPTION OF PROCEDURE: The patient was brought to the operating room and placed supine on the operating table.  The patient had been signed prior to the procedure and this was documented. The patient had the anesthesia placed by the anesthesiologist.  The prep verification and incision time-outs were performed to confirm that this was the correct patient, site, side and location. The patient had SCDs in place. The patient did receive antibiotics prior to the incision and was redosed during the procedure as needed at indicated intervals.  The patient's well leg was placed in a flexed lithotomy position and carefully padded.  The operative leg was placed in traction and also well-padded.  Care was taken to confirm radiographs before prepping and draping. The hip was prepped and draped in the standard fashion.  Screws were placed using the following technique.  The 0.062" Kirschner wire was first placed and confirmed to be in the proper location on both AP and  lateral views. After the guidewire was placed, the skin incision was made over the guidewire, then the 4.5 mm cannulated drill was started over the wire.  The drill was used to drill the bony corridor, again confirming during drilling on both views. The final cannulated screw guidewire was then placed and again confirmed in position on both views.  The measuring stick was used to measure the length of screws.  This was repeated for all screws.  The approach-withdraw phenomenon was visualized under live fluoroscopy to confirm that all screws were of the appropriate length and in the head.   Of note, during the orthopedic procedure, the bone quality was found to be poor. All wounds were copiously irrigated with saline and then the skin was reapproximated with staples. The wounds were cleaned and dried a final time and a sterile dressing. The patient was then transferred back to the bed and left the operating room in stable condition.  All sponge and instrument counts were correct.  POSTOPERATIVE PLAN: Mr. Rodino will be weight bearing as tolerated; he will return for staple removal in 2 weeks.

## 2019-02-20 NOTE — Discharge Instructions (Signed)
° ° °  1. Change dressings as needed °2. May shower but keep incisions covered and dry °3. Take lovenox to prevent blood clots °4. Take stool softeners as needed °5. Take pain meds as needed ° °

## 2019-02-20 NOTE — Progress Notes (Signed)
Initial Nutrition Assessment  RD working remotely.  DOCUMENTATION CODES:   Not applicable  INTERVENTION:   -Once diet is advanced, add:   -Ensure Enlive po BID, each supplement provides 350 kcal and 20 grams of protein -MVI with minerals daily  NUTRITION DIAGNOSIS:   Increased nutrient needs related to post-op healing as evidenced by estimated needs.  GOAL:   Patient will meet greater than or equal to 90% of their needs  MONITOR:   Diet advancement, PO intake, Supplement acceptance, Labs, Weight trends, Skin, I & O's  REASON FOR ASSESSMENT:   Consult Hip fracture protocol  ASSESSMENT:   [name] [name] is a 85 y.o. male with dementia, hypertension, hyperlipidemia, sleep apnea was brought to the ER the patient was found to have weakness fall at the nursing station at the living facility.  Patient fell towards his right side and started having pain in the right hip and was brought to the ER.  Pt admitted with rt hip fracture s/p mechanical fall.   Reviewed I/O's:-550 ml x 24 hours  UOP: 550 ml x 24 hours  Pt unable to provide further nutrition-related history secondary to dementia. Unable to obtain further nutrition-related history at this time.   Per chart review, pt resided at [facility]. Wt has been stable over the past year (per RD notes from hospitalization last year, pt was on a a heart healthy diet).   Per MD notes, plan for surgical fixation of rt hip later today. Pt currently NPO for procedure. Pt would benefit from addition of nutritional supplements post-operatively to help promote adequate oral intake.   Labs reviewed.   NUTRITION - FOCUSED PHYSICAL EXAM:    Most Recent Value  Orbital Region  Unable to assess  Upper Arm Region  Unable to assess  Thoracic and Lumbar Region  Unable to assess  Buccal Region  Unable to assess  [place] Region  Unable to assess  Clavicle Bone Region  Unable to assess  Clavicle and Acromion Bone Region  Unable to assess   Scapular Bone Region  Unable to assess  Dorsal Hand  Unable to assess  Patellar Region  Unable to assess  Anterior Thigh Region  Unable to assess  Posterior Calf Region  Unable to assess  Edema (RD Assessment)  Unable to assess  Hair  Unable to assess  Eyes  Unable to assess  Mouth  Unable to assess  Skin  Unable to assess  Nails  Unable to assess       Diet Order:   Diet Order            Diet NPO time specified  Diet effective midnight              EDUCATION NEEDS:   Not appropriate for education at this time  Skin:  Skin Assessment: Reviewed RN Assessment  Last BM:  Unknown  Height:   Ht Readings from Last 1 Encounters:  02/19/19 6\' 2"  (1.88 m)    Weight:   Wt Readings from Last 1 Encounters:  02/19/19 81.6 kg    Ideal Body Weight:  86.4 kg  BMI:  Body mass index is 23.11 kg/m.  Estimated Nutritional Needs:   Kcal:  1800-2000  Protein:  90-105 grams  Fluid:  > 1.8 L    Usbaldo Pannone A. [name], RD, LDN, [place] Registered Dietitian II Certified Diabetes Care and Education Specialist Pager: [phone] After hours Pager: [phone]

## 2019-02-20 NOTE — Anesthesia Preprocedure Evaluation (Signed)
Anesthesia Evaluation  Patient identified by MRN, date of birth, ID band Patient awake    Reviewed: Allergy & Precautions, NPO status , Patient's Chart, lab work & pertinent test results  Airway Mallampati: II  TM Distance: >3 FB Neck ROM: Full    Dental no notable dental hx.    Pulmonary sleep apnea ,    Pulmonary exam normal breath sounds clear to auscultation       Cardiovascular negative cardio ROS Normal cardiovascular exam Rhythm:Regular Rate:Normal     Neuro/Psych Anxiety Depression Dementia negative neurological ROS  negative psych ROS   GI/Hepatic Neg liver ROS, GERD  ,  Endo/Other  negative endocrine ROS  Renal/GU negative Renal ROS  negative genitourinary   Musculoskeletal  (+) Arthritis ,   Abdominal   Peds negative pediatric ROS (+)  Hematology negative hematology ROS (+)   Anesthesia Other Findings   Reproductive/Obstetrics negative OB ROS                             Anesthesia Physical Anesthesia Plan  ASA: III  Anesthesia Plan: General   Post-op Pain Management:    Induction: Intravenous  PONV Risk Score and Plan: 2 and Ondansetron, Midazolam and Treatment may vary due to age or medical condition  Airway Management Planned: Oral ETT  Additional Equipment:   Intra-op Plan:   Post-operative Plan: Extubation in OR  Informed Consent: I have reviewed the patients History and Physical, chart, labs and discussed the procedure including the risks, benefits and alternatives for the proposed anesthesia with the patient or authorized representative who has indicated his/her understanding and acceptance.     Dental advisory given  Plan Discussed with: CRNA  Anesthesia Plan Comments:         Anesthesia Quick Evaluation

## 2019-02-20 NOTE — Progress Notes (Signed)
Attempted to obtain consent from Mathis Bud. Left VM will try again shortly.

## 2019-02-20 NOTE — Progress Notes (Signed)
Patient returned from OR from right hip pinning.

## 2019-02-20 NOTE — Social Work (Signed)
CSW acknowledging consult for SNF placement. Will follow for therapy recommendations.   Noretta Frier, MSW, LCSWA [place] Clinical Social Work [phone]   

## 2019-02-20 NOTE — Social Work (Signed)
CSW acknowledging consult for SNF placement. Will follow for therapy recommendations. Pt is not from a SNF but rather ALF, [facility].   Westley Hummer, MSW, [place] Work [phone]

## 2019-02-20 NOTE — Progress Notes (Signed)
PROGRESS NOTE    [name] [name]   VEH:[id number]  DOB: 1931-06-22  DOA: 02/19/2019 PCP: Leanna Battles, MD   Brief Narrative:  [name] [name] is an 85 y/o male from [facility] memory care with h/o dementia, OSA, HLD who presents after a fall with right sided hip pain. He is found to have a right hip fracture.   Subjective: He is confused but has no complaints.     Assessment & Plan:   Principal Problem:   Closed displaced fracture of right femoral neck  - management per ortho team Active Problems:     Dementia  -cont to follow for behavioral disturbances  OSA (per chart) - will order CPAP for bedtime     Time spent in minutes: 35 DVT prophylaxis: per orhto after surgery Code Status: Full code Family Communication:  Disposition Plan: f/u on PT eval after surgery Consultants:   ortho Procedures:   none Antimicrobials:  Anti-infectives (From admission, onward)   Start     Dose/Rate Route Frequency Ordered Stop   02/20/19 0600  ceFAZolin (ANCEF) IVPB 2g/100 mL premix     2 g 200 mL/hr over 30 Minutes Intravenous On call to O.R. 02/19/19 2340 02/21/19 0559       Objective: Vitals:   02/19/19 1930 02/19/19 2130 02/19/19 2210 02/20/19 0633  BP: (!) 155/80 140/66 139/83 122/69  Pulse: 60 60 65 (!) 52  Resp: 18 20 19 16   Temp:   98.2 F (36.8 C) (!) 97.5 F (36.4 C)  TempSrc:   Axillary Oral  SpO2: 95% 95% 97% 99%  Weight:      Height:        Intake/Output Summary (Last 24 hours) at 02/20/2019 0812 Last data filed at 02/20/2019 0351 Gross per 24 hour  Intake --  Output 550 ml  Net -550 ml   Filed Weights   02/19/19 1521  Weight: 81.6 kg    Examination: General exam: Appears comfortable  HEENT: PERRLA, oral mucosa moist, no sclera icterus or thrush Respiratory system: Clear to auscultation. Respiratory effort normal. Cardiovascular system: S1 & S2 heard, RRR.   Gastrointestinal system: Abdomen soft, non-tender, nondistended. Normal  bowel sounds. Central nervous system: Alert and oriented only to person. No focal neurological deficits. Extremities: No cyanosis, clubbing or edema Skin: No rashes or ulcers Psychiatry:  Mood & affect appropriate.     Data Reviewed: I have personally reviewed following labs and imaging studies  CBC: Recent Labs  Lab 02/19/19 1752 02/20/19 0323  WBC 4.9 6.9  NEUTROABS 3.4  --   HGB 12.6* 11.7*  HCT 39.7 35.5*  MCV 94.1 90.3  PLT 159 836   Basic Metabolic Panel: Recent Labs  Lab 02/19/19 1752 02/20/19 0323  NA 140 137  K 3.8 3.6  CL 103 101  CO2 30 29  GLUCOSE 98 122*  BUN 22 15  CREATININE 0.78 0.70  CALCIUM 8.6* 8.5*   GFR: Estimated Creatinine Clearance: 77.9 mL/min (by C-G formula based on SCr of 0.7 mg/dL). Liver Function Tests: No results for input(s): AST, ALT, ALKPHOS, BILITOT, PROT, ALBUMIN in the last 168 hours. No results for input(s): LIPASE, AMYLASE in the last 168 hours. No results for input(s): AMMONIA in the last 168 hours. Coagulation Profile: Recent Labs  Lab 02/19/19 1752  INR 0.9   Cardiac Enzymes: Recent Labs  Lab 02/19/19 1752  TROPONINI <0.03   BNP (last 3 results) No results for input(s): PROBNP in the last 8760 hours. HbA1C: No results  for input(s): HGBA1C in the last 72 hours. CBG: No results for input(s): GLUCAP in the last 168 hours. Lipid Profile: No results for input(s): CHOL, HDL, LDLCALC, TRIG, CHOLHDL, LDLDIRECT in the last 72 hours. Thyroid Function Tests: No results for input(s): TSH, T4TOTAL, FREET4, T3FREE, THYROIDAB in the last 72 hours. Anemia Panel: No results for input(s): VITAMINB12, FOLATE, FERRITIN, TIBC, IRON, RETICCTPCT in the last 72 hours. Urine analysis:    Component Value Date/Time   COLORURINE STRAW (A) 02/19/2019 1708   APPEARANCEUR CLEAR 02/19/2019 1708   LABSPEC 1.008 02/19/2019 1708   PHURINE 7.0 02/19/2019 1708   GLUCOSEU NEGATIVE 02/19/2019 1708   HGBUR NEGATIVE 02/19/2019 1708    BILIRUBINUR NEGATIVE 02/19/2019 1708   KETONESUR NEGATIVE 02/19/2019 1708   PROTEINUR NEGATIVE 02/19/2019 1708   NITRITE NEGATIVE 02/19/2019 1708   LEUKOCYTESUR NEGATIVE 02/19/2019 1708   Sepsis Labs: @LABRCNTIP (procalcitonin:4,lacticidven:4) ) Recent Results (from the past 240 hour(s))  SARS Coronavirus 2 (CEPHEID - Performed in [place] hospital lab), Hosp Order     Status: None   Collection Time: 02/19/19  6:44 PM   Specimen: Nasopharyngeal Swab  Result Value Ref Range Status   SARS Coronavirus 2 NEGATIVE NEGATIVE Final    Comment: (NOTE) If result is NEGATIVE SARS-CoV-2 target nucleic acids are NOT DETECTED. The SARS-CoV-2 RNA is generally detectable in upper and lower  respiratory specimens during the acute phase of infection. The lowest  concentration of SARS-CoV-2 viral copies this assay can detect is 250  copies / mL. A negative result does not preclude SARS-CoV-2 infection  and should not be used as the sole basis for treatment or other  patient management decisions.  A negative result may occur with  improper specimen collection / handling, submission of specimen other  than nasopharyngeal swab, presence of viral mutation(s) within the  areas targeted by this assay, and inadequate number of viral copies  (<250 copies / mL). A negative result must be combined with clinical  observations, patient history, and epidemiological information. If result is POSITIVE SARS-CoV-2 target nucleic acids are DETECTED. The SARS-CoV-2 RNA is generally detectable in upper and lower  respiratory specimens dur ing the acute phase of infection.  Positive  results are indicative of active infection with SARS-CoV-2.  Clinical  correlation with patient history and other diagnostic information is  necessary to determine patient infection status.  Positive results do  not rule out bacterial infection or co-infection with other viruses. If result is PRESUMPTIVE POSTIVE SARS-CoV-2 nucleic  acids MAY BE PRESENT.   A presumptive positive result was obtained on the submitted specimen  and confirmed on repeat testing.  While 2019 novel coronavirus  (SARS-CoV-2) nucleic acids may be present in the submitted sample  additional confirmatory testing may be necessary for epidemiological  and / or clinical management purposes  to differentiate between  SARS-CoV-2 and other Sarbecovirus currently known to infect humans.  If clinically indicated additional testing with an alternate test  methodology [phone]) is advised. The SARS-CoV-2 RNA is generally  detectable in upper and lower respiratory sp ecimens during the acute  phase of infection. The expected result is Negative. Fact Sheet for Patients:  StrictlyIdeas.no Fact Sheet for Healthcare Providers: BankingDealers.co.za This test is not yet approved or cleared by the [place] FDA and has been authorized for detection and/or diagnosis of SARS-CoV-2 by FDA under an Emergency Use Authorization (EUA).  This EUA will remain in effect (meaning this test can be used) for the duration of the COVID-19 declaration under  Section 564(b)(1) of the Act, 21 U.S.C. section 360bbb-3(b)(1), unless the authorization is terminated or revoked sooner. Performed at [facility], [place] [address], [place], [place] [address]   Surgical pcr screen     Status: Abnormal   Collection Time: 02/20/19  3:39 AM   Specimen: Nasal Mucosa; Nasal Swab  Result Value Ref Range Status   MRSA, PCR NEGATIVE NEGATIVE Final   Staphylococcus aureus POSITIVE (A) NEGATIVE Final    Comment: (NOTE) The Xpert SA Assay (FDA approved for NASAL specimens in patients 21 years of age and older), is one component of a comprehensive surveillance program. It is not intended to diagnose infection nor to guide or monitor treatment. Performed at [facility], [place] [address] [place], [place] [address]           Radiology Studies: Dg Chest 1 View  Result Date: 02/19/2019 CLINICAL DATA:  Pain after fall EXAM: CHEST  1 VIEW COMPARISON:  February 13, 2018 FINDINGS: The heart size and mediastinal contours are within normal limits. Both lungs are clear. The visualized skeletal structures are unremarkable. IMPRESSION: No active disease. Electronically Signed   By: Dorise Bullion III M.D   On: 02/19/2019 16:58   Ct Head Wo Contrast  Result Date: 02/19/2019 CLINICAL DATA:  Head trauma, no neurological decline, follow-up EXAM: CT HEAD WITHOUT CONTRAST CT CERVICAL SPINE WITHOUT CONTRAST TECHNIQUE: Multidetector CT imaging of the head and cervical spine was performed following the standard protocol without intravenous contrast. Multiplanar CT image reconstructions of the cervical spine were also generated. COMPARISON:  02/14/2018 CT head, cervical spine radiographs 06/09/2016 FINDINGS: CT HEAD FINDINGS Brain: Generalized atrophy. Normal ventricular morphology. No midline shift or mass effect. Small vessel chronic ischemic changes of deep cerebral white matter. Benign-appearing basal ganglia calcifications bilaterally. No intracranial hemorrhage, mass lesion, or evidence of acute infarction. Vascular: No hyperdense vessels. Minimal atherosclerotic calcification of internal carotid and vertebral arteries at skull base Skull: Demineralized but intact Sinuses/Orbits: Clear Other: N/A CT CERVICAL SPINE FINDINGS Alignment: 2 mm anterolisthesis C6-C7 unchanged. Otherwise normal alignment. Thoracic kyphosis. Skull base and vertebrae: Osseous demineralization. Multilevel facet degenerative changes. Scattered mild disc space narrowing. Vertebral body heights maintained. No fracture or bone destruction. Soft tissues and spinal canal: Prevertebral soft tissues normal thickness. Atherosclerotic calcifications of the carotid bifurcations. Remaining cervical soft tissues unremarkable. Disc levels:  Unremarkable Upper chest: Minimal  biapical scarring. Other: N/A IMPRESSION: Atrophy with small vessel chronic ischemic changes of deep cerebral white matter. No acute intracranial abnormalities. Osseous demineralization with degenerative facet disease changes of the cervical spine. No acute cervical spine abnormalities. Electronically Signed   By: Lavonia Dana M.D.   On: 02/19/2019 17:18   Ct Cervical Spine Wo Contrast  Result Date: 02/19/2019 CLINICAL DATA:  Head trauma, no neurological decline, follow-up EXAM: CT HEAD WITHOUT CONTRAST CT CERVICAL SPINE WITHOUT CONTRAST TECHNIQUE: Multidetector CT imaging of the head and cervical spine was performed following the standard protocol without intravenous contrast. Multiplanar CT image reconstructions of the cervical spine were also generated. COMPARISON:  02/14/2018 CT head, cervical spine radiographs 06/09/2016 FINDINGS: CT HEAD FINDINGS Brain: Generalized atrophy. Normal ventricular morphology. No midline shift or mass effect. Small vessel chronic ischemic changes of deep cerebral white matter. Benign-appearing basal ganglia calcifications bilaterally. No intracranial hemorrhage, mass lesion, or evidence of acute infarction. Vascular: No hyperdense vessels. Minimal atherosclerotic calcification of internal carotid and vertebral arteries at skull base Skull: Demineralized but intact Sinuses/Orbits: Clear Other: N/A CT CERVICAL SPINE FINDINGS Alignment:  2 mm anterolisthesis C6-C7 unchanged. Otherwise normal alignment. Thoracic kyphosis. Skull base and vertebrae: Osseous demineralization. Multilevel facet degenerative changes. Scattered mild disc space narrowing. Vertebral body heights maintained. No fracture or bone destruction. Soft tissues and spinal canal: Prevertebral soft tissues normal thickness. Atherosclerotic calcifications of the carotid bifurcations. Remaining cervical soft tissues unremarkable. Disc levels:  Unremarkable Upper chest: Minimal biapical scarring. Other: N/A IMPRESSION:  Atrophy with small vessel chronic ischemic changes of deep cerebral white matter. No acute intracranial abnormalities. Osseous demineralization with degenerative facet disease changes of the cervical spine. No acute cervical spine abnormalities. Electronically Signed   By: Lavonia Dana M.D.   On: 02/19/2019 17:18   Dg Knee Complete 4 Views Right  Result Date: 02/19/2019 CLINICAL DATA:  Weakness fall.  Pain. EXAM: RIGHT KNEE - COMPLETE 4+ VIEW COMPARISON:  None. FINDINGS: No evidence of fracture, dislocation, or joint effusion. No evidence of arthropathy or other focal bone abnormality. Soft tissues are unremarkable. IMPRESSION: Negative. Electronically Signed   By: Dorise Bullion III M.D   On: 02/19/2019 16:57   Dg Hip Unilat With Pelvis 2-3 Views Right  Result Date: 02/19/2019 CLINICAL DATA:  Pain after fall. EXAM: DG HIP (WITH OR WITHOUT PELVIS) 2-3V RIGHT COMPARISON:  None. FINDINGS: There is a subcapital fracture so she aided with proximal right femur. The left hip is intact on the single frontal view. Pelvic bones otherwise normal. No dislocation. IMPRESSION: Impacted subcapital fracture in the proximal right femur. Electronically Signed   By: Dorise Bullion III M.D   On: 02/19/2019 16:56      Scheduled Meds:  feeding supplement  296 mL Oral Once   povidone-iodine  2 application Topical Once   tranexamic acid (CYKLOKAPRON) topical -INTRAOP  2,000 mg Topical To OR   Continuous Infusions:   ceFAZolin (ANCEF) IV     tranexamic acid       LOS: 1 day      Debbe Odea, MD [facility] Pager: [email or web address] Password [facility] 02/20/2019, 8:12 AM

## 2019-02-20 NOTE — Consult Note (Signed)
ORTHOPAEDIC CONSULTATION  REQUESTING PHYSICIAN: Debbe Odea, MD  Chief Complaint: right femoral neck fracture  HPI: [name] [name] is a 85 y.o. male who presents with right femoral neck fracture s/p mechanical fall at facility.  He has advanced dementia.  HPI details are limited.  Normally ambulates without any assistance.  Past Medical History:  Diagnosis Date   Allergic rhinitis, cause unspecified    Anxiety state, unspecified    Arthritis    Benign neoplasm of colon    Depressive disorder, not elsewhere classified    Esophageal reflux    Hyperlipidemia    Irritable bowel syndrome    Obstructive sleep apnea (adult) (pediatric)    Other chronic allergic conjunctivitis    Ulcer 1951   stomach   Past Surgical History:  Procedure Laterality Date   APPENDECTOMY     CATARACT EXTRACTION W/ INTRAOCULAR LENS  IMPLANT, BILATERAL  2003   COLONOSCOPY     ESOPHAGOGASTRODUODENOSCOPY     TONSILLECTOMY     Social History   Socioeconomic History   Marital status: Married    Spouse name: Not on file   Number of children: Not on file   Years of education: Not on file   Highest education level: Not on file  Occupational History   Not on file  Social Needs   Financial resource strain: Not on file   Food insecurity    Worry: Not on file    Inability: Not on file   Transportation needs    Medical: Not on file    Non-medical: Not on file  Tobacco Use   Smoking status: Never Smoker   Smokeless tobacco: Never Used  Substance and Sexual Activity   Alcohol use: No   Drug use: No   Sexual activity: Not on file  Lifestyle   Physical activity    Days per week: Not on file    Minutes per session: Not on file   Stress: Not on file  Relationships   Social connections    Talks on phone: Not on file    Gets together: Not on file    Attends religious service: Not on file    Active member of club or organization: Not on file    Attends  meetings of clubs or organizations: Not on file    Relationship status: Not on file  Other Topics Concern   Not on file  Social History Narrative   Not on file   Family History  Problem Relation Age of Onset   Colon cancer Father    Stroke Mother    Heart attack Mother    Colon cancer Mother    Rectal cancer Neg Hx    Stomach cancer Neg Hx    Allergies  Allergen Reactions   Codeine     Other reaction(s): Unknown   Penicillins Rash   Ranitidine     Other reaction(s): Unknown   Amoxicillin Rash   Prior to Admission medications   Medication Sig Start Date End Date Taking? Authorizing Provider  cetirizine (ZYRTEC) 10 MG tablet Take 10 mg by mouth daily. 09/20/18  Yes [provider]  Cholecalciferol (VITAMIN D) 2000 units tablet Take 1 tablet (2,000 Units total) by mouth daily. 03/08/18  Yes Mariel Aloe, MD  hydrOXYzine (ATARAX/VISTARIL) 25 MG tablet Take 1 tablet (25 mg total) by mouth 3 (three) times daily as needed for itching. 03/08/18  Yes Mariel Aloe, MD  pravastatin (PRAVACHOL) 10 MG tablet Take 1 tablet (10 mg  total) by mouth daily. 03/09/18  Yes Mariel Aloe, MD  acetaminophen (TYLENOL) 325 MG tablet Take 2 tablets (650 mg total) by mouth every 6 (six) hours as needed for mild pain or headache. 03/08/18   Mariel Aloe, MD  doxycycline (VIBRA-TABS) 100 MG tablet Take 1 tablet (100 mg total) by mouth 2 (two) times daily. Patient not taking: Reported on 02/19/2019 04/06/18   Suzan Slick, NP   Dg Chest 1 View  Result Date: 02/19/2019 CLINICAL DATA:  Pain after fall EXAM: CHEST  1 VIEW COMPARISON:  February 13, 2018 FINDINGS: The heart size and mediastinal contours are within normal limits. Both lungs are clear. The visualized skeletal structures are unremarkable. IMPRESSION: No active disease. Electronically Signed   By: Dorise Bullion III M.D   On: 02/19/2019 16:58   Ct Head Wo Contrast  Result Date: 02/19/2019 CLINICAL DATA:  Head trauma, no  neurological decline, follow-up EXAM: CT HEAD WITHOUT CONTRAST CT CERVICAL SPINE WITHOUT CONTRAST TECHNIQUE: Multidetector CT imaging of the head and cervical spine was performed following the standard protocol without intravenous contrast. Multiplanar CT image reconstructions of the cervical spine were also generated. COMPARISON:  02/14/2018 CT head, cervical spine radiographs 06/09/2016 FINDINGS: CT HEAD FINDINGS Brain: Generalized atrophy. Normal ventricular morphology. No midline shift or mass effect. Small vessel chronic ischemic changes of deep cerebral white matter. Benign-appearing basal ganglia calcifications bilaterally. No intracranial hemorrhage, mass lesion, or evidence of acute infarction. Vascular: No hyperdense vessels. Minimal atherosclerotic calcification of internal carotid and vertebral arteries at skull base Skull: Demineralized but intact Sinuses/Orbits: Clear Other: N/A CT CERVICAL SPINE FINDINGS Alignment: 2 mm anterolisthesis C6-C7 unchanged. Otherwise normal alignment. Thoracic kyphosis. Skull base and vertebrae: Osseous demineralization. Multilevel facet degenerative changes. Scattered mild disc space narrowing. Vertebral body heights maintained. No fracture or bone destruction. Soft tissues and spinal canal: Prevertebral soft tissues normal thickness. Atherosclerotic calcifications of the carotid bifurcations. Remaining cervical soft tissues unremarkable. Disc levels:  Unremarkable Upper chest: Minimal biapical scarring. Other: N/A IMPRESSION: Atrophy with small vessel chronic ischemic changes of deep cerebral white matter. No acute intracranial abnormalities. Osseous demineralization with degenerative facet disease changes of the cervical spine. No acute cervical spine abnormalities. Electronically Signed   By: Lavonia Dana M.D.   On: 02/19/2019 17:18   Ct Cervical Spine Wo Contrast  Result Date: 02/19/2019 CLINICAL DATA:  Head trauma, no neurological decline, follow-up EXAM: CT HEAD  WITHOUT CONTRAST CT CERVICAL SPINE WITHOUT CONTRAST TECHNIQUE: Multidetector CT imaging of the head and cervical spine was performed following the standard protocol without intravenous contrast. Multiplanar CT image reconstructions of the cervical spine were also generated. COMPARISON:  02/14/2018 CT head, cervical spine radiographs 06/09/2016 FINDINGS: CT HEAD FINDINGS Brain: Generalized atrophy. Normal ventricular morphology. No midline shift or mass effect. Small vessel chronic ischemic changes of deep cerebral white matter. Benign-appearing basal ganglia calcifications bilaterally. No intracranial hemorrhage, mass lesion, or evidence of acute infarction. Vascular: No hyperdense vessels. Minimal atherosclerotic calcification of internal carotid and vertebral arteries at skull base Skull: Demineralized but intact Sinuses/Orbits: Clear Other: N/A CT CERVICAL SPINE FINDINGS Alignment: 2 mm anterolisthesis C6-C7 unchanged. Otherwise normal alignment. Thoracic kyphosis. Skull base and vertebrae: Osseous demineralization. Multilevel facet degenerative changes. Scattered mild disc space narrowing. Vertebral body heights maintained. No fracture or bone destruction. Soft tissues and spinal canal: Prevertebral soft tissues normal thickness. Atherosclerotic calcifications of the carotid bifurcations. Remaining cervical soft tissues unremarkable. Disc levels:  Unremarkable Upper chest: Minimal biapical scarring. Other: N/A IMPRESSION: Atrophy  with small vessel chronic ischemic changes of deep cerebral white matter. No acute intracranial abnormalities. Osseous demineralization with degenerative facet disease changes of the cervical spine. No acute cervical spine abnormalities. Electronically Signed   By: Lavonia Dana M.D.   On: 02/19/2019 17:18   Dg Knee Complete 4 Views Right  Result Date: 02/19/2019 CLINICAL DATA:  Weakness fall.  Pain. EXAM: RIGHT KNEE - COMPLETE 4+ VIEW COMPARISON:  None. FINDINGS: No evidence of  fracture, dislocation, or joint effusion. No evidence of arthropathy or other focal bone abnormality. Soft tissues are unremarkable. IMPRESSION: Negative. Electronically Signed   By: Dorise Bullion III M.D   On: 02/19/2019 16:57   Dg Hip Unilat With Pelvis 2-3 Views Right  Result Date: 02/19/2019 CLINICAL DATA:  Pain after fall. EXAM: DG HIP (WITH OR WITHOUT PELVIS) 2-3V RIGHT COMPARISON:  None. FINDINGS: There is a subcapital fracture so she aided with proximal right femur. The left hip is intact on the single frontal view. Pelvic bones otherwise normal. No dislocation. IMPRESSION: Impacted subcapital fracture in the proximal right femur. Electronically Signed   By: Dorise Bullion III M.D   On: 02/19/2019 16:56    All pertinent xrays, MRI, CT independently reviewed and interpreted  Positive ROS: All other systems have been reviewed and were otherwise negative with the exception of those mentioned in the HPI and as above.  Physical Exam: General: Alert, no acute distress Cardiovascular: No pedal edema Respiratory: No cyanosis, no use of accessory musculature GI: No organomegaly, abdomen is soft and non-tender Skin: No lesions in the area of chief complaint Neurologic: Sensation intact distally Psychiatric: Patient has advanced dementia Lymphatic: No axillary or cervical lymphadenopathy  MUSCULOSKELETAL:  - severe pain with movement of the hip and extremity - skin intact - NVI distally - compartments soft  Assessment: Right femoral neck fracture  Plan: - surgical stabilization is recommended, HCPOA is aware of r/b/a and wish to proceed, informed consent obtained - medical optimization per primary team - surgery is planned for this afternoon  Thank you for the consult and the opportunity to see [name] [name]. Eduard Roux, MD [place] 11:19 AM

## 2019-02-21 DIAGNOSIS — G4733 Obstructive sleep apnea (adult) (pediatric): Secondary | ICD-10-CM

## 2019-02-21 DIAGNOSIS — D62 Acute posthemorrhagic anemia: Secondary | ICD-10-CM

## 2019-02-21 LAB — BASIC METABOLIC PANEL
Anion gap: 9 (ref 5–15)
BUN: 13 mg/dL (ref 8–23)
CO2: 28 mmol/L (ref 22–32)
Calcium: 8.5 mg/dL — ABNORMAL LOW (ref 8.9–10.3)
Chloride: 99 mmol/L (ref 98–111)
Creatinine, Ser: 0.76 mg/dL (ref 0.61–1.24)
GFR calc Af Amer: >60 mL/min (ref >60)
GFR calc non Af Amer: >60 mL/min (ref >60)
Glucose, Bld: 111 mg/dL — ABNORMAL HIGH (ref 70–99)
Potassium: 3.9 mmol/L (ref 3.5–5.1)
Sodium: 136 mmol/L (ref 135–145)

## 2019-02-21 LAB — CBC
HCT: 36 % — ABNORMAL LOW (ref 39.0–52.0)
Hemoglobin: 11.8 g/dL — ABNORMAL LOW (ref 13.0–17.0)
MCH: 29.4 pg (ref 26.0–34.0)
MCHC: 32.8 g/dL (ref 30.0–36.0)
MCV: 89.8 fL (ref 80.0–100.0)
Platelets: 151 10*3/uL (ref 150–400)
RBC: 4.01 MIL/uL — ABNORMAL LOW (ref 4.22–5.81)
RDW: 13.2 % (ref 11.5–15.5)
WBC: 7.8 10*3/uL (ref 4.0–10.5)
nRBC: 0 % (ref 0.0–0.2)

## 2019-02-21 NOTE — NC FL2 (Signed)
[place] LEVEL OF CARE SCREENING TOOL     IDENTIFICATION  Patient Name: [name] [name] Birthdate: 1930-12-29 Sex: male Admission Date (Current Location): 02/19/2019  [facility] and [place] Number:  Herbalist and Address:  [facility] [place], [place] [address], [place], [place] [address]      Provider Number: [id number]  Attending Physician Name and Address:  Debbe Odea, MD  Relative Name and Phone Number:  Mathis Bud; friend; [phone]    Current Level of Care: Hospital Recommended Level of Care: [place] Prior Approval Number:    Date Approved/Denied:   PASRR Number: pending  Discharge Plan: SNF    Current Diagnoses: Patient Active Problem List   Diagnosis Date Noted  . Closed displaced fracture of right femoral neck ([place]) 02/19/2019  . Hip fracture ([place]) 02/19/2019  . Closed right hip fracture ([place]) 02/19/2019  . Evaluation by psychiatric service required   . Hypoxia 02/13/2018  . Edema 02/13/2018  . Hypoxemia 02/13/2018  . Dementia ([place]) 02/13/2018  . Altered mental status 02/13/2018  . Dermatitis 10/08/2015  . CHEST PAIN, ATYPICAL 04/11/2010  . Personal history of colon polyps and family history of colon cancer 12/19/2007  . Anxiety state 12/19/2007  . DEPRESSION 12/19/2007  . OBSTRUCTIVE SLEEP APNEA 12/19/2007  . ALLERGIC CONJUNCTIVITIS 12/19/2007  . Seasonal allergic rhinitis 12/19/2007  . GERD 12/19/2007  . IBS 12/19/2007    Orientation RESPIRATION BLADDER Height & Weight     Self  Normal Incontinent, External catheter Weight: 180 lb (81.6 kg) Height:  6\' 2"  (188 cm)  BEHAVIORAL SYMPTOMS/MOOD NEUROLOGICAL BOWEL NUTRITION STATUS      Continent Diet(see discharge summary)  AMBULATORY STATUS COMMUNICATION OF NEEDS Skin   Extensive Assist Verbally Surgical wounds(hydrocolloid dressing on right hip)                       Personal Care Assistance Level of Assistance  Feeding,  Dressing, Bathing Bathing Assistance: Maximum assistance Feeding assistance: Limited assistance Dressing Assistance: Maximum assistance     Functional Limitations Info  Sight, Speech, Hearing Sight Info: Adequate Hearing Info: Adequate Speech Info: Adequate    SPECIAL CARE FACTORS FREQUENCY  OT (By licensed OT), PT (By licensed PT)     PT Frequency: 5x week OT Frequency: 5x week            Contractures Contractures Info: Not present    Additional Factors Info  Code Status, Allergies Code Status Info: Full Code Allergies Info: Codeine, Penicillins, Ranitidine, Amoxicillin           Current Medications (02/21/2019):  This is the current hospital active medication list Current Facility-Administered Medications  Medication Dose Route Frequency Provider Last Rate Last Dose  . 0.9 %  sodium chloride infusion   Intravenous Continuous Leandrew Koyanagi, MD 75 mL/hr at 02/20/19 1635    . acetaminophen (TYLENOL) tablet 325-650 mg  325-650 mg Oral Q6H PRN Leandrew Koyanagi, MD      . acetaminophen (TYLENOL) tablet 500 mg  500 mg Oral Q6H Leandrew Koyanagi, MD   500 mg at 02/21/19 1321  . alum & mag hydroxide-simeth (MAALOX/MYLANTA) 200-200-20 MG/5ML suspension 30 mL  30 mL Oral Q4H PRN Leandrew Koyanagi, MD      . ceFAZolin (ANCEF) IVPB 2g/100 mL premix  2 g Intravenous Q6H Leandrew Koyanagi, MD 200 mL/hr at 02/21/19 0356 2 g at 02/21/19 0356  . docusate sodium (COLACE) capsule 100  mg  100 mg Oral BID Leandrew Koyanagi, MD   100 mg at 02/21/19 1026  . enoxaparin (LOVENOX) injection 40 mg  40 mg Subcutaneous Q24H Leandrew Koyanagi, MD   40 mg at 02/21/19 0807  . feeding supplement (ENSURE ENLIVE) (ENSURE ENLIVE) liquid 237 mL  237 mL Oral BID BM Leandrew Koyanagi, MD   237 mL at 02/21/19 1322  . HYDROcodone-acetaminophen (NORCO) 7.5-325 MG per tablet 1-2 tablet  1-2 tablet Oral Q4H PRN Leandrew Koyanagi, MD      . HYDROcodone-acetaminophen (NORCO/VICODIN) 5-325 MG per tablet 1-2 tablet  1-2 tablet Oral Q4H PRN Leandrew Koyanagi, MD   2 tablet at 02/20/19 2018  . magnesium citrate solution 1 Bottle  1 Bottle Oral Once PRN Leandrew Koyanagi, MD      . menthol-cetylpyridinium (CEPACOL) lozenge 3 mg  1 lozenge Oral PRN Leandrew Koyanagi, MD       Or  . phenol (CHLORASEPTIC) mouth spray 1 spray  1 spray Mouth/Throat PRN Leandrew Koyanagi, MD      . methocarbamol (ROBAXIN) tablet 500 mg  500 mg Oral Q6H PRN Leandrew Koyanagi, MD   500 mg at 02/21/19 0531   Or  . methocarbamol (ROBAXIN) 500 mg in dextrose 5 % 50 mL IVPB  500 mg Intravenous Q6H PRN Leandrew Koyanagi, MD      . morphine 2 MG/ML injection 1 mg  1 mg Intravenous Q2H PRN Leandrew Koyanagi, MD      . multivitamin with minerals tablet 1 tablet  1 tablet Oral Daily Leandrew Koyanagi, MD   1 tablet at 02/21/19 1026  . ondansetron (ZOFRAN) tablet 4 mg  4 mg Oral Q6H PRN Leandrew Koyanagi, MD       Or  . ondansetron [facility]) injection 4 mg  4 mg Intravenous Q6H PRN Leandrew Koyanagi, MD      . polyethylene glycol (MIRALAX / GLYCOLAX) packet 17 g  17 g Oral Daily PRN Leandrew Koyanagi, MD      . sorbitol 70 % solution 30 mL  30 mL Oral Daily PRN Leandrew Koyanagi, MD         Discharge Medications: Please see discharge summary for a list of discharge medications.  Relevant Imaging Results:  Relevant Lab Results:   Additional Information [id number] [place], [place]

## 2019-02-21 NOTE — Anesthesia Postprocedure Evaluation (Signed)
Anesthesia Post Note  Patient: [name] [name]  Procedure(s) Performed: CANNULATED HIP PINNING (Right Hip)     Patient location during evaluation: PACU Anesthesia Type: General Level of consciousness: awake and alert Pain management: pain level controlled Vital Signs Assessment: post-procedure vital signs reviewed and stable Respiratory status: spontaneous breathing, nonlabored ventilation and respiratory function stable Cardiovascular status: blood pressure returned to baseline and stable Postop Assessment: no apparent nausea or vomiting Anesthetic complications: no    Last Vitals:  Vitals:   02/20/19 2030 02/21/19 0559  BP: (!) 144/91 (!) 127/91  Pulse: 66 64  Resp: 20 18  Temp: (!) 36.4 C 36.5 C  SpO2: 98% 99%    Last Pain:  Vitals:   02/21/19 0559  TempSrc: Oral  PainSc:    Pain Goal:                   Lynda Rainwater

## 2019-02-21 NOTE — Progress Notes (Signed)
PROGRESS NOTE    [name] [name]   ENM:[id number]  DOB: 06-09-1931  DOA: 02/19/2019 PCP: Leanna Battles, MD   Brief Narrative:  [name] [name] is an 85 y/o male from [facility] memory care with h/o dementia, OSA, HLD who presents after a fall with right sided hip pain. He is found to have a right hip fracture.   Subjective: Remains confused- no complaints.     Assessment & Plan:   Principal Problem:   Closed displaced fracture of right femoral neck  - ORIF on 6/15 - OT recommends SNF-  social work aware- no note from OT yet - management per ortho team- WBAT, Lovenox,  f/u in 2 wks with Dr Erlinda Hong  Active Problems:     Dementia  -cont to follow for behavioral disturbances  Mild ABLA - follow  OSA (per chart) -  ordered CPAP for bedtime     Time spent in minutes: 35 DVT prophylaxis: per ortho after surgery Code Status: Full code Family Communication:  Disposition Plan: f/u on PT eval   Consultants:   ortho Procedures:   ORIF Antimicrobials:  Anti-infectives (From admission, onward)   Start     Dose/Rate Route Frequency Ordered Stop   02/20/19 2200  ceFAZolin (ANCEF) IVPB 2g/100 mL premix     2 g 200 mL/hr over 30 Minutes Intravenous Every 6 hours 02/20/19 1609 02/21/19 1559   02/20/19 0600  ceFAZolin (ANCEF) IVPB 2g/100 mL premix     2 g 200 mL/hr over 30 Minutes Intravenous On call to O.R. 02/19/19 2340 02/20/19 1404       Objective: Vitals:   02/20/19 1620 02/20/19 2030 02/21/19 0559 02/21/19 1357  BP: (!) 142/87 (!) 144/91 (!) 127/91 (!) 146/81  Pulse: 74 66 64 60  Resp: 19 20 18 14   Temp: (!) 97.5 F (36.4 C) (!) 97.5 F (36.4 C) 97.7 F (36.5 C) 97.8 F (36.6 C)  TempSrc: Oral Oral Oral Oral  SpO2: 100% 98% 99% (!) 80%  Weight:      Height:        Intake/Output Summary (Last 24 hours) at 02/21/2019 1456 Last data filed at 02/21/2019 0930 Gross per 24 hour  Intake 638.75 ml  Output 750 ml  Net -111.25 ml   Filed Weights   02/19/19 1521  Weight: 81.6 kg    Examination: General exam: Appears comfortable  HEENT: PERRLA, oral mucosa moist, no sclera icterus or thrush Respiratory system: Clear to auscultation. Respiratory effort normal. Cardiovascular system: S1 & S2 heard,  No murmurs  Gastrointestinal system: Abdomen soft, non-tender, nondistended. Normal bowel sounds   Central nervous system: Alert and oriented only to person. No focal neurological deficits. Extremities: No cyanosis, clubbing or edema Skin: No rashes or ulcers Psychiatry:  Mood & affect appropriate.     Data Reviewed: I have personally reviewed following labs and imaging studies  CBC: Recent Labs  Lab 02/19/19 1752 02/20/19 0323 02/21/19 0329  WBC 4.9 6.9 7.8  NEUTROABS 3.4  --   --   HGB 12.6* 11.7* 11.8*  HCT 39.7 35.5* 36.0*  MCV 94.1 90.3 89.8  PLT 159 153 031   Basic Metabolic Panel: Recent Labs  Lab 02/19/19 1752 02/20/19 0323 02/21/19 0329  NA 140 137 136  K 3.8 3.6 3.9  CL 103 101 99  CO2 30 29 28   GLUCOSE 98 122* 111*  BUN 22 15 13   CREATININE 0.78 0.70 0.76  CALCIUM 8.6* 8.5* 8.5*   GFR: Estimated Creatinine Clearance:  77.9 mL/min (by C-G formula based on SCr of 0.76 mg/dL). Liver Function Tests: No results for input(s): AST, ALT, ALKPHOS, BILITOT, PROT, ALBUMIN in the last 168 hours. No results for input(s): LIPASE, AMYLASE in the last 168 hours. No results for input(s): AMMONIA in the last 168 hours. Coagulation Profile: Recent Labs  Lab 02/19/19 1752  INR 0.9   Cardiac Enzymes: Recent Labs  Lab 02/19/19 1752  TROPONINI <0.03   BNP (last 3 results) No results for input(s): PROBNP in the last 8760 hours. HbA1C: No results for input(s): HGBA1C in the last 72 hours. CBG: Recent Labs  Lab 02/20/19 1232  GLUCAP 92   Lipid Profile: No results for input(s): CHOL, HDL, LDLCALC, TRIG, CHOLHDL, LDLDIRECT in the last 72 hours. Thyroid Function Tests: No results for input(s): TSH, T4TOTAL,  FREET4, T3FREE, THYROIDAB in the last 72 hours. Anemia Panel: No results for input(s): VITAMINB12, FOLATE, FERRITIN, TIBC, IRON, RETICCTPCT in the last 72 hours. Urine analysis:    Component Value Date/Time   COLORURINE STRAW (A) 02/19/2019 1708   APPEARANCEUR CLEAR 02/19/2019 1708   LABSPEC 1.008 02/19/2019 1708   PHURINE 7.0 02/19/2019 1708   GLUCOSEU NEGATIVE 02/19/2019 1708   HGBUR NEGATIVE 02/19/2019 1708   BILIRUBINUR NEGATIVE 02/19/2019 1708   KETONESUR NEGATIVE 02/19/2019 1708   PROTEINUR NEGATIVE 02/19/2019 1708   NITRITE NEGATIVE 02/19/2019 1708   LEUKOCYTESUR NEGATIVE 02/19/2019 1708   Sepsis Labs: @LABRCNTIP (procalcitonin:4,lacticidven:4) ) Recent Results (from the past 240 hour(s))  SARS Coronavirus 2 (CEPHEID - Performed in [place] hospital lab), Hosp Order     Status: None   Collection Time: 02/19/19  6:44 PM   Specimen: Nasopharyngeal Swab  Result Value Ref Range Status   SARS Coronavirus 2 NEGATIVE NEGATIVE Final    Comment: (NOTE) If result is NEGATIVE SARS-CoV-2 target nucleic acids are NOT DETECTED. The SARS-CoV-2 RNA is generally detectable in upper and lower  respiratory specimens during the acute phase of infection. The lowest  concentration of SARS-CoV-2 viral copies this assay can detect is 250  copies / mL. A negative result does not preclude SARS-CoV-2 infection  and should not be used as the sole basis for treatment or other  patient management decisions.  A negative result may occur with  improper specimen collection / handling, submission of specimen other  than nasopharyngeal swab, presence of viral mutation(s) within the  areas targeted by this assay, and inadequate number of viral copies  (<250 copies / mL). A negative result must be combined with clinical  observations, patient history, and epidemiological information. If result is POSITIVE SARS-CoV-2 target nucleic acids are DETECTED. The SARS-CoV-2 RNA is generally detectable in upper  and lower  respiratory specimens dur ing the acute phase of infection.  Positive  results are indicative of active infection with SARS-CoV-2.  Clinical  correlation with patient history and other diagnostic information is  necessary to determine patient infection status.  Positive results do  not rule out bacterial infection or co-infection with other viruses. If result is PRESUMPTIVE POSTIVE SARS-CoV-2 nucleic acids MAY BE PRESENT.   A presumptive positive result was obtained on the submitted specimen  and confirmed on repeat testing.  While 2019 novel coronavirus  (SARS-CoV-2) nucleic acids may be present in the submitted sample  additional confirmatory testing may be necessary for epidemiological  and / or clinical management purposes  to differentiate between  SARS-CoV-2 and other Sarbecovirus currently known to infect humans.  If clinically indicated additional testing with an alternate test  methodology [phone]) is advised. The SARS-CoV-2 RNA is generally  detectable in upper and lower respiratory sp ecimens during the acute  phase of infection. The expected result is Negative. Fact Sheet for Patients:  StrictlyIdeas.no Fact Sheet for Healthcare Providers: BankingDealers.co.za This test is not yet approved or cleared by the [place] FDA and has been authorized for detection and/or diagnosis of SARS-CoV-2 by FDA under an Emergency Use Authorization (EUA).  This EUA will remain in effect (meaning this test can be used) for the duration of the COVID-19 declaration under Section 564(b)(1) of the Act, 21 U.S.C. section 360bbb-3(b)(1), unless the authorization is terminated or revoked sooner. Performed at [facility], [place] [address] [place], [place] [address]   Surgical pcr screen     Status: Abnormal   Collection Time: 02/20/19  3:39 AM   Specimen: Nasal Mucosa; Nasal Swab  Result Value Ref Range Status    MRSA, PCR NEGATIVE NEGATIVE Final   Staphylococcus aureus POSITIVE (A) NEGATIVE Final    Comment: (NOTE) The Xpert SA Assay (FDA approved for NASAL specimens in patients 70 years of age and older), is one component of a comprehensive surveillance program. It is not intended to diagnose infection nor to guide or monitor treatment. Performed at [facility], [place] [address] [place], [place] [address]          Radiology Studies: Dg Chest 1 View  Result Date: 02/19/2019 CLINICAL DATA:  Pain after fall EXAM: CHEST  1 VIEW COMPARISON:  February 13, 2018 FINDINGS: The heart size and mediastinal contours are within normal limits. Both lungs are clear. The visualized skeletal structures are unremarkable. IMPRESSION: No active disease. Electronically Signed   By: Dorise Bullion III M.D   On: 02/19/2019 16:58   Ct Head Wo Contrast  Result Date: 02/19/2019 CLINICAL DATA:  Head trauma, no neurological decline, follow-up EXAM: CT HEAD WITHOUT CONTRAST CT CERVICAL SPINE WITHOUT CONTRAST TECHNIQUE: Multidetector CT imaging of the head and cervical spine was performed following the standard protocol without intravenous contrast. Multiplanar CT image reconstructions of the cervical spine were also generated. COMPARISON:  02/14/2018 CT head, cervical spine radiographs 06/09/2016 FINDINGS: CT HEAD FINDINGS Brain: Generalized atrophy. Normal ventricular morphology. No midline shift or mass effect. Small vessel chronic ischemic changes of deep cerebral white matter. Benign-appearing basal ganglia calcifications bilaterally. No intracranial hemorrhage, mass lesion, or evidence of acute infarction. Vascular: No hyperdense vessels. Minimal atherosclerotic calcification of internal carotid and vertebral arteries at skull base Skull: Demineralized but intact Sinuses/Orbits: Clear Other: N/A CT CERVICAL SPINE FINDINGS Alignment: 2 mm anterolisthesis C6-C7 unchanged. Otherwise normal alignment. Thoracic kyphosis. Skull  base and vertebrae: Osseous demineralization. Multilevel facet degenerative changes. Scattered mild disc space narrowing. Vertebral body heights maintained. No fracture or bone destruction. Soft tissues and spinal canal: Prevertebral soft tissues normal thickness. Atherosclerotic calcifications of the carotid bifurcations. Remaining cervical soft tissues unremarkable. Disc levels:  Unremarkable Upper chest: Minimal biapical scarring. Other: N/A IMPRESSION: Atrophy with small vessel chronic ischemic changes of deep cerebral white matter. No acute intracranial abnormalities. Osseous demineralization with degenerative facet disease changes of the cervical spine. No acute cervical spine abnormalities. Electronically Signed   By: Lavonia Dana M.D.   On: 02/19/2019 17:18   Ct Cervical Spine Wo Contrast  Result Date: 02/19/2019 CLINICAL DATA:  Head trauma, no neurological decline, follow-up EXAM: CT HEAD WITHOUT CONTRAST CT CERVICAL SPINE WITHOUT CONTRAST TECHNIQUE: Multidetector CT imaging of the head and cervical spine was performed following the standard  protocol without intravenous contrast. Multiplanar CT image reconstructions of the cervical spine were also generated. COMPARISON:  02/14/2018 CT head, cervical spine radiographs 06/09/2016 FINDINGS: CT HEAD FINDINGS Brain: Generalized atrophy. Normal ventricular morphology. No midline shift or mass effect. Small vessel chronic ischemic changes of deep cerebral white matter. Benign-appearing basal ganglia calcifications bilaterally. No intracranial hemorrhage, mass lesion, or evidence of acute infarction. Vascular: No hyperdense vessels. Minimal atherosclerotic calcification of internal carotid and vertebral arteries at skull base Skull: Demineralized but intact Sinuses/Orbits: Clear Other: N/A CT CERVICAL SPINE FINDINGS Alignment: 2 mm anterolisthesis C6-C7 unchanged. Otherwise normal alignment. Thoracic kyphosis. Skull base and vertebrae: Osseous demineralization.  Multilevel facet degenerative changes. Scattered mild disc space narrowing. Vertebral body heights maintained. No fracture or bone destruction. Soft tissues and spinal canal: Prevertebral soft tissues normal thickness. Atherosclerotic calcifications of the carotid bifurcations. Remaining cervical soft tissues unremarkable. Disc levels:  Unremarkable Upper chest: Minimal biapical scarring. Other: N/A IMPRESSION: Atrophy with small vessel chronic ischemic changes of deep cerebral white matter. No acute intracranial abnormalities. Osseous demineralization with degenerative facet disease changes of the cervical spine. No acute cervical spine abnormalities. Electronically Signed   By: Lavonia Dana M.D.   On: 02/19/2019 17:18   Dg Knee Complete 4 Views Right  Result Date: 02/19/2019 CLINICAL DATA:  Weakness fall.  Pain. EXAM: RIGHT KNEE - COMPLETE 4+ VIEW COMPARISON:  None. FINDINGS: No evidence of fracture, dislocation, or joint effusion. No evidence of arthropathy or other focal bone abnormality. Soft tissues are unremarkable. IMPRESSION: Negative. Electronically Signed   By: Dorise Bullion III M.D   On: 02/19/2019 16:57   Dg C-arm 1-60 Min  Result Date: 02/20/2019 CLINICAL DATA:  Fracture EXAM: DG C-ARM 61-120 MIN COMPARISON:  02/19/2019 FINDINGS: Two images were provided for a total of 1 minutes and 8 seconds of fluoroscopy time during percutaneous pinning of the right hip. The alignment is stable. The hardware is intact. IMPRESSION: Status post percutaneous pinning of the right hip. Electronically Signed   By: Constance Holster M.D.   On: 02/20/2019 14:53   Dg Hip Operative Unilat W Or W/o Pelvis Right  Result Date: 02/20/2019 CLINICAL DATA:  Fracture EXAM: OPERATIVE right HIP (WITH PELVIS IF PERFORMED) to VIEWS TECHNIQUE: Fluoroscopic spot image(s) were submitted for interpretation post-operatively. COMPARISON:  02/19/2019 FINDINGS: The patient has undergone percutaneous pinning of the right hip. The  alignment appears stable. Hardware is intact. Two images were obtained. The total fluoroscopy time was 1 minutes and 8 seconds. IMPRESSION: Fluoroscopy for right-sided percutaneous pinning of the femur. Electronically Signed   By: Constance Holster M.D.   On: 02/20/2019 14:53   Dg Hip Unilat With Pelvis 2-3 Views Right  Result Date: 02/19/2019 CLINICAL DATA:  Pain after fall. EXAM: DG HIP (WITH OR WITHOUT PELVIS) 2-3V RIGHT COMPARISON:  None. FINDINGS: There is a subcapital fracture so she aided with proximal right femur. The left hip is intact on the single frontal view. Pelvic bones otherwise normal. No dislocation. IMPRESSION: Impacted subcapital fracture in the proximal right femur. Electronically Signed   By: Dorise Bullion III M.D   On: 02/19/2019 16:56      Scheduled Meds:  acetaminophen  500 mg Oral Q6H   docusate sodium  100 mg Oral BID   enoxaparin (LOVENOX) injection  40 mg Subcutaneous Q24H   feeding supplement (ENSURE ENLIVE)  237 mL Oral BID BM   multivitamin with minerals  1 tablet Oral Daily   Continuous Infusions:  sodium chloride 75 mL/hr at  02/20/19 1635    ceFAZolin (ANCEF) IV 2 g (02/21/19 0356)   methocarbamol (ROBAXIN) IV       LOS: 2 days      Debbe Odea, MD [facility] Pager: [email or web address] Password [facility] 02/21/2019, 2:56 PM

## 2019-02-21 NOTE — Progress Notes (Signed)
Subjective: 1 Day Post-Op Procedure(s) (LRB): CANNULATED HIP PINNING (Right) Patient reports pain as mild.  Pain with movement of the right hip.  Objective: Vital signs in last 24 hours: Temp:  [97.2 F (36.2 C)-97.7 F (36.5 C)] 97.7 F (36.5 C) (06/16 0559) Pulse Rate:  [64-79] 64 (06/16 0559) Resp:  [13-20] 18 (06/16 0559) BP: (124-144)/(67-91) 127/91 (06/16 0559) SpO2:  [95 %-100 %] 99 % (06/16 0559)  Intake/Output from previous day: 06/15 0701 - 06/16 0700 In: 898.8 [I.V.:598.8; IV Piggyback:300] Out: 800 [Urine:750; Blood:50] Intake/Output this shift: No intake/output data recorded.  Recent Labs    02/19/19 1752 02/20/19 0323 02/21/19 0329  HGB 12.6* 11.7* 11.8*   Recent Labs    02/20/19 0323 02/21/19 0329  WBC 6.9 7.8  RBC 3.93* 4.01*  HCT 35.5* 36.0*  PLT 153 151   Recent Labs    02/20/19 0323 02/21/19 0329  NA 137 136  K 3.6 3.9  CL 101 99  CO2 29 28  BUN 15 13  CREATININE 0.70 0.76  GLUCOSE 122* 111*  CALCIUM 8.5* 8.5*   Recent Labs    02/19/19 1752  INR 0.9    Neurovascular intact Sensation intact distally Intact pulses distally Dorsiflexion/Plantar flexion intact Incision: dressing C/D/I No cellulitis present Compartment soft   Assessment/Plan: 1 Day Post-Op Procedure(s) (LRB): CANNULATED HIP PINNING (Right) Up with therapy  WBAT RLE ABLA-mild and stable F/u with Dr. Erlinda Hong in two weeks for staple removal      Aundra Dubin 02/21/2019, 7:24 AM

## 2019-02-21 NOTE — TOC Initial Note (Signed)
Transition of Care [facility]) - Initial/Assessment Note    Patient Details  Name: [name] [name] MRN: [id number] Date of Birth: 15-Nov-1930  Transition of Care [facility]) CM/SW Contact:    Alexander Mt, [place] Phone Number: 02/21/2019, 2:25 PM  Clinical Narrative:                 CSW received return call from Mathis Bud; clarified that he is emegency contact with [facility]. Aaron Edelman states he is HCPOA and CSW requested that he bring in updated documentation of this as last documents do not have him on there. Aaron Edelman confirmed pt has been resident at [facility] since July of 2019. Pt wife passed and pt has no children. Aaron Edelman understands recommendations, if [facility] cannot take pt back with PT then Aaron Edelman gives permission for referrals to be sent out to local SNFs.   We discussed visitation guidelines and should a facility require private sitter services Aaron Edelman said the pt should be able to afford those. Await PT recommendations to send referral.   Expected Discharge Plan: Skilled Nursing Facility Barriers to Discharge: Continued Medical Work up, [place] (PASRR)   Patient Goals and CMS Choice   CMS Medicare.gov Compare Post Acute Care list provided to:: Patient Represenative (must comment)([name] Mamie Levers) Choice offered to / list presented to : [place] / Guardian  Expected Discharge Plan and Services Expected Discharge Plan: [place] In-house Referral: Clinical Social Work   Post Acute Care Choice: [place] Living arrangements for the past 2 months: [place]                  Prior Living Arrangements/Services Living arrangements for the past 2 months: [place] Lives with:: Facility Resident Patient language and need for interpreter reviewed:: Yes(no needs) Do you feel safe going back to the place where you live?: Yes      Need for Family Participation in Patient Care: Yes (Comment)(decision  making) Care giver support system in place?: Yes (comment)(facility resident; [place]) Current home services: DME Criminal Activity/Legal Involvement Pertinent to Current Situation/Hospitalization: No - Comment as needed  Activities of Daily Living      Permission Sought/Granted Permission sought to share information with : Family Supports, Chartered certified accountant granted to share information with : No(pt with memory loss)  Share Information with NAME: Mathis Bud  Permission granted to share info w AGENCY: [place]; SNFs  Permission granted to share info w Relationship: pt HCPOA  Permission granted to share info w Contact Information: [phone]  Emotional Assessment Appearance:: Other (Comment Required(spoke with HCPOA) Attitude/Demeanor/Rapport: (spoke with HCPOA) Affect (typically observed): (spoke with HCPOA) Orientation: : Oriented to Self Alcohol / Substance Use: Not Applicable Psych Involvement: No (comment)  Admission diagnosis:  Closed right hip fracture, initial encounter ([place]) [S72.001A] Fall at nursing home, initial encounter [W19.XXXA, Y92.129] Closed right hip fracture ([place]) [S72.001A] Patient Active Problem List   Diagnosis Date Noted  . Closed displaced fracture of right femoral neck ([place]) 02/19/2019  . Hip fracture ([place]) 02/19/2019  . Closed right hip fracture ([place]) 02/19/2019  . Evaluation by psychiatric service required   . Hypoxia 02/13/2018  . Edema 02/13/2018  . Hypoxemia 02/13/2018  . Dementia ([place]) 02/13/2018  . Altered mental status 02/13/2018  . Dermatitis 10/08/2015  . CHEST PAIN, ATYPICAL 04/11/2010  . Personal history of colon polyps and family history of colon cancer 12/19/2007  . Anxiety state 12/19/2007  . DEPRESSION 12/19/2007  . OBSTRUCTIVE SLEEP  APNEA 12/19/2007  . ALLERGIC CONJUNCTIVITIS 12/19/2007  . Seasonal allergic rhinitis 12/19/2007  . GERD 12/19/2007  . IBS 12/19/2007   PCP:  Leanna Battles, MD Pharmacy:   [facility] [phone] - [place] - [name] AT [place] [name] [place] [address] Phone: [phone] Fax: [phone]  [id number] - [address] [place] - [place] [address] Phone: [phone] Fax: [phone]     Social Determinants of Health (SDOH) Interventions    Readmission Risk Interventions No flowsheet data found.

## 2019-02-21 NOTE — Social Work (Addendum)
HIPAA compliant message left for "pt HCPOA" Mathis Bud at [phone]. CSW does not have any documentation with Mr. Howell as HCPOA, however, and have also followed up with [place] in order to clarify pt supports. CSW continuing to follow for support with disposition when medically appropriate. Continue to await PT/OT evaluations.   Westley Hummer, MSW, [place] Work [phone]

## 2019-02-21 NOTE — Evaluation (Addendum)
Physical Therapy Evaluation Patient Details Name: [name] [name] MRN: [id number] DOB: 12-04-1930 Today's Date: 02/21/2019   History of Present Illness  Pt is an 85 y.o. male admitted from [facility] memory care unit on 02/19/19 after fall sustaining R femoral neck fx. S/p R hip pinning 6/15. PMH includes dementia, OSA, arthritis, anxiety.    Clinical Impression  Pt presents with an overall decrease in functional mobility secondary to above. Pt disoriented and poor historian; per chart, from memory care unit. Today, pt able to ambulate with RW and min guard; moving well despite RLE pain. Pt would benefit from continued acute PT services to maximize functional mobility and independence prior to return to SNF.     Follow Up Recommendations SNF;Supervision/Assistance - 24 hour(return to memory care unit)    Equipment Recommendations  None recommended by PT    Recommendations for Other Services       Precautions / Restrictions Precautions Precautions: Fall Restrictions Weight Bearing Restrictions: Yes RLE Weight Bearing: Weight bearing as tolerated      Mobility  Bed Mobility Overal bed mobility: Needs Assistance Bed Mobility: Sit to Supine       Sit to supine: Modified independent (Device/Increase time)   General bed mobility comments: Pt received standing with mobility tech. Mod indep return to supine  Transfers Overall transfer level: Needs assistance Equipment used: Rolling walker (2 wheeled) Transfers: Sit to/from Stand Sit to Stand: Min guard            Ambulation/Gait Ambulation/Gait assistance: Min guard Gait Distance (Feet): 150 Feet Assistive device: Rolling walker (2 wheeled) Gait Pattern/deviations: Step-through pattern;Decreased stride length;Decreased weight shift to right;Antalgic Gait velocity: Decreased Gait velocity interpretation: 1.31 - 2.62 ft/sec, indicative of limited community ambulator General Gait Details: Antalgic gait with RW and  close min guard for balance; pt with strong BUEs attempting to hop on LLE to avoid RLE WB, frequent cues to increase RLE WBAT. Frequent redirection to task  Stairs            Wheelchair Mobility    Modified Rankin (Stroke Patients Only)       Balance Overall balance assessment: Needs assistance   Sitting balance-Leahy Scale: Fair       Standing balance-Leahy Scale: Poor                               Pertinent Vitals/Pain Pain Assessment: Faces Faces Pain Scale: Hurts little more Pain Location: R hip Pain Descriptors / Indicators: Grimacing;Guarding;Sharp Pain Intervention(s): Monitored during session    Home Living Family/patient expects to be discharged to:: Skilled nursing facility                 Additional Comments: Per chart, from [facility]    Prior Function Level of Independence: Needs assistance         Comments: Pt poor historian with h/o dementia. Per chart, from Memory Care unit at [facility]     Hand Dominance        Extremity/Trunk Assessment   Upper Extremity Assessment Upper Extremity Assessment: Generalized weakness    Lower Extremity Assessment Lower Extremity Assessment: Generalized weakness;RLE deficits/detail RLE Deficits / Details: Functionally at least 3/5 throughout    Cervical / Trunk Assessment Cervical / Trunk Assessment: Kyphotic  Communication   Communication: HOH  Cognition Arousal/Alertness: Awake/alert Behavior During Therapy: Impulsive;Restless Overall Cognitive Status: History of cognitive impairments - at baseline  General Comments: H/o dementia; tangential, nonsensical speech at times, not following conversation or answering questions appropriately. Pleasant and agreeable to mobilize      General Comments      Exercises     Assessment/Plan    PT Assessment Patient needs continued PT services  PT Problem List Decreased  strength;Decreased range of motion;Decreased activity tolerance;Decreased balance;Decreased mobility;Decreased cognition;Decreased knowledge of use of DME;Decreased safety awareness;Decreased knowledge of precautions;Pain       PT Treatment Interventions DME instruction;Gait training;Functional mobility training;Therapeutic activities;Therapeutic exercise;Balance training    PT Goals (Current goals can be found in the Care Plan section)  Acute Rehab PT Goals Patient Stated Goal: None stated PT Goal Formulation: With patient Time For Goal Achievement: 03/07/19    Frequency Min 3X/week   Barriers to discharge        Co-evaluation               AM-PAC PT "6 Clicks" Mobility  Outcome Measure Help needed turning from your back to your side while in a flat bed without using bedrails?: A Little Help needed moving from lying on your back to sitting on the side of a flat bed without using bedrails?: A Little Help needed moving to and from a bed to a chair (including a wheelchair)?: A Little Help needed standing up from a chair using your arms (e.g., wheelchair or bedside chair)?: A Little Help needed to walk in hospital room?: A Little Help needed climbing 3-5 steps with a railing? : A Little 6 Click Score: 18    End of Session Equipment Utilized During Treatment: Gait belt Activity Tolerance: Patient tolerated treatment well Patient left: in bed;with call bell/phone within reach;with bed alarm set Nurse Communication: Mobility status PT Visit Diagnosis: Other abnormalities of gait and mobility (R26.89)    Time: 9147-8295 PT Time Calculation (min) (ACUTE ONLY): 13 min   Charges:   PT Evaluation $PT Eval Moderate Complexity: [place], PT, DPT Acute Rehabilitation Services  Pager [phone] Office [place] 02/21/2019, 5:49 PM

## 2019-02-21 NOTE — Progress Notes (Signed)
Occupational Therapy Evaluation Patient Details Name: [name] [name] MRN: [id number] DOB: [birth year] Today's Date: 02/21/2019    History of Present Illness Zeien is a 85 y.o. male with dementia, hypertension, hyperlipidemia, sleep apnea.Presented to [facility] due to weakness fall at the nursing station at the living facility resulting in a closed displaced fx of R femoral neck.    Clinical Impression   Pt presented with above description. PTA pt PLOF unknown due to pt's inability to accurately provided information due to level of cognition. Per chart, pt was a resident at a memory care center. Pt Ox1 able to state initials of name. Pt currently required several cues for safety and sequencing. Mod A in sit to stand transition from chair with VCs for hand placement. Pt tolerates simple commands well to engage in functional tasks. DC to preferred location by physician with 24 support. OT will continue to follow acutely.    Follow Up Recommendations  SNF;Supervision/Assistance - 24 hour    Equipment Recommendations       Recommendations for Other Services       Precautions / Restrictions Precautions Precautions: None Restrictions Weight Bearing Restrictions: Yes RLE Weight Bearing: Weight bearing as tolerated      Mobility Bed Mobility               General bed mobility comments: Pt received in recliner upon arrival  Transfers Overall transfer level: Needs assistance Equipment used: Rolling walker (2 wheeled) Transfers: Sit to/from Stand Sit to Stand: Mod assist         General transfer comment: Mod A to power up to stand with RW.     Balance Overall balance assessment: Needs assistance         Standing balance support: Bilateral upper extremity supported Standing balance-Leahy Scale: Poor                             ADL either performed or assessed with clinical judgement   ADL Overall ADL's : Needs assistance/impaired Eating/Feeding:  Sitting;Set up                   Lower Body Dressing: Minimal assistance;Sitting/lateral leans Lower Body Dressing Details (indicate cue type and reason): requires VCs to stay at task. Initiate don and doff of socks required assist to complete.                General ADL Comments: Pt very talkative, requires several cues for redirection to stay task during session. Unable to retrieve accurate information required home set up and PLOF due to dementia. Unable to fully assess areas of ADLs deferred required further assessment.      Vision         Perception     Praxis      Pertinent Vitals/Pain Pain Assessment: Faces Faces Pain Scale: Hurts little more Pain Location: R hip Pain Descriptors / Indicators: Grimacing;Guarding;Sharp Pain Intervention(s): Monitored during session;Repositioned     Hand Dominance     Extremity/Trunk Assessment Upper Extremity Assessment Upper Extremity Assessment: Generalized weakness   Lower Extremity Assessment Lower Extremity Assessment: Defer to PT evaluation   Cervical / Trunk Assessment Cervical / Trunk Assessment: Kyphotic   Communication Communication Communication: HOH   Cognition Arousal/Alertness: Awake/alert Behavior During Therapy: Impulsive Overall Cognitive Status: History of cognitive impairments - at baseline  General Comments: PMH of dementia   General Comments       Exercises     Shoulder Instructions      Home Living Family/patient expects to be discharged to:: Unsure                                 Additional Comments: Unable to retrieve information due to level of cognition. No number in file to discuss PLOF.      Prior Functioning/Environment Level of Independence: Needs assistance        Comments: Patient unable to accurately give information due to dementia. Chart reports living in SNF        OT Problem List: Decreased activity  tolerance;Impaired balance (sitting and/or standing);Decreased cognition;Decreased safety awareness;Decreased knowledge of use of DME or AE;Pain      OT Treatment/Interventions: Self-care/ADL training;Therapeutic exercise;DME and/or AE instruction;Therapeutic activities;Patient/family education;Balance training    OT Goals(Current goals can be found in the care plan section) Acute Rehab OT Goals Patient Stated Goal: goal not stated OT Goal Formulation: Patient unable to participate in goal setting Time For Goal Achievement: 03/07/19 Potential to Achieve Goals: Fair  OT Frequency: Min 2X/week   Barriers to D/C:            Co-evaluation              AM-PAC OT "6 Clicks" Daily Activity     Outcome Measure Help from another person eating meals?: A Lot Help from another person taking care of personal grooming?: A Little Help from another person toileting, which includes using toliet, bedpan, or urinal?: A Little Help from another person bathing (including washing, rinsing, drying)?: A Little Help from another person to put on and taking off regular upper body clothing?: A Little Help from another person to put on and taking off regular lower body clothing?: A Lot(further assessment required in all areas) 6 Click Score: 16   End of Session Equipment Utilized During Treatment: Gait belt;Rolling walker Nurse Communication: Mobility status;Weight bearing status  Activity Tolerance: Other (comment);Patient tolerated treatment well Patient left: in chair;with call bell/phone within reach;with chair alarm set  OT Visit Diagnosis: Unsteadiness on feet (R26.81);Repeated falls (R29.6);Muscle weakness (generalized) (M62.81);Pain Pain - Right/Left: Right Pain - part of body: Hip                Time: 1210-1228 OT Time Calculation (min): 18 min Charges:  OT General Charges $OT Visit: 1 Visit OT Evaluation $OT Eval Low Complexity: [place], MSOT, OTR/L  Supplemental  Rehabilitation Services  [phone]   Marius Ditch 02/21/2019, 12:46 PM

## 2019-02-21 NOTE — Progress Notes (Signed)
Spoke with patients caregiver Aaron Edelman to give an update on patient.

## 2019-02-22 ENCOUNTER — Encounter (HOSPITAL_COMMUNITY): Payer: Self-pay | Admitting: Orthopaedic Surgery

## 2019-02-22 LAB — CBC
HCT: 32.2 % — ABNORMAL LOW (ref 39.0–52.0)
Hemoglobin: 10.6 g/dL — ABNORMAL LOW (ref 13.0–17.0)
MCH: 29.6 pg (ref 26.0–34.0)
MCHC: 32.9 g/dL (ref 30.0–36.0)
MCV: 89.9 fL (ref 80.0–100.0)
Platelets: 140 10*3/uL — ABNORMAL LOW (ref 150–400)
RBC: 3.58 MIL/uL — ABNORMAL LOW (ref 4.22–5.81)
RDW: 13.2 % (ref 11.5–15.5)
WBC: 4.8 10*3/uL (ref 4.0–10.5)
nRBC: 0 % (ref 0.0–0.2)

## 2019-02-22 LAB — BASIC METABOLIC PANEL
Anion gap: 7 (ref 5–15)
BUN: 15 mg/dL (ref 8–23)
CO2: 28 mmol/L (ref 22–32)
Calcium: 8.3 mg/dL — ABNORMAL LOW (ref 8.9–10.3)
Chloride: 101 mmol/L (ref 98–111)
Creatinine, Ser: 0.71 mg/dL (ref 0.61–1.24)
GFR calc Af Amer: >60 mL/min (ref >60)
GFR calc non Af Amer: >60 mL/min (ref >60)
Glucose, Bld: 101 mg/dL — ABNORMAL HIGH (ref 70–99)
Potassium: 3.9 mmol/L (ref 3.5–5.1)
Sodium: 136 mmol/L (ref 135–145)

## 2019-02-22 NOTE — TOC Progression Note (Addendum)
Transition of Care [facility]) - Progression Note    Patient Details  Name: [name] [name] MRN: [id number] Date of Birth: 04/23/1931  [facility] CM/SW [place], [place] Phone Number: 02/22/2019, 10:21 AM  Clinical Narrative:  1:25pm- Received call back after calling x2 from [facility], discussed pt needs and current ambulation status. They will discuss with ALF leadership before calling CSW back. Pt HCPOA [name] [name] is aware and will bring HCPOA paperwork to hospital.     10:21am- Pt is from ALF, Memory Care at [facility]. Ambulated around 150 ft min guard. Message left at [facility] to see if pt can return to a familiar environment with assistance.    Expected Discharge Plan: [place] Barriers to Discharge: Continued Medical Work up, [place] Forensic scientist)  Expected Discharge Plan and Services Expected Discharge Plan: [place] In-house Referral: Clinical Social Work   Post Acute Care Choice: [place] Living arrangements for the past 2 months: [place]                                       Social Determinants of Health (SDOH) Interventions    Readmission Risk Interventions No flowsheet data found.

## 2019-02-22 NOTE — Progress Notes (Signed)
Called patients caregiver Aaron Edelman to give an update on patient. Did not get an answer but left a voicemail on answering machine.

## 2019-02-22 NOTE — Progress Notes (Signed)
PROGRESS NOTE    [facility] [name] [name]   TZG:[id number]  DOB: [birth year]  DOA: 02/19/2019 PCP: Leanna Battles, MD   Brief Narrative:  [name] [name] is an 85 y/o male from [facility] memory care with h/o dementia, OSA, HLD who presents after a fall with right sided hip pain. He is found to have a right hip fracture.   Subjective: Remains confused No significant history from patient   Assessment & Plan:   Principal Problem:   Closed displaced fracture of right femoral neck  - ORIF on 6/15 - OT recommends SNF-  social work aware- no note from OT yet - management per ortho team- WBAT, Lovenox,  f/u in 2 wks with Dr Erlinda Hong  Active Problems:     Dementia  -cont to follow for behavioral disturbances -Remains significantly confused.     Mild ABLA - follow  OSA (per chart) -  ordered CPAP for bedtime     Time spent in minutes: 25 DVT prophylaxis: per ortho after surgery Code Status: Full code Family Communication:  Disposition Plan: f/u on PT eval   Consultants:   ortho Procedures:   ORIF Antimicrobials:  Anti-infectives (From admission, onward)   Start     Dose/Rate Route Frequency Ordered Stop   02/20/19 2200  ceFAZolin (ANCEF) IVPB 2g/100 mL premix     2 g 200 mL/hr over 30 Minutes Intravenous Every 6 hours 02/20/19 1609 02/21/19 1559   02/20/19 0600  ceFAZolin (ANCEF) IVPB 2g/100 mL premix     2 g 200 mL/hr over 30 Minutes Intravenous On call to O.R. 02/19/19 2340 02/20/19 1404       Objective: Vitals:   02/21/19 0559 02/21/19 1357 02/21/19 2129 02/22/19 0531  BP: (!) 127/91 (!) 146/81 131/76 121/74  Pulse: 64 60 64 (!) 57  Resp: 18 14 18 18   Temp: 97.7 F (36.5 C) 97.8 F (36.6 C) 98.1 F (36.7 C) 97.7 F (36.5 C)  TempSrc: Oral Oral Oral Oral  SpO2: 99% (!) 80% 100% 98%  Weight:      Height:        Intake/Output Summary (Last 24 hours) at 02/22/2019 1120 Last data filed at 02/22/2019 7591 Gross per 24 hour  Intake 840 ml  Output 725 ml   Net 115 ml   Filed Weights   02/19/19 1521  Weight: 81.6 kg    Examination: General exam: Appears confused  HEENT: PERRLA, oral mucosa moist, no sclera icterus or thrush Respiratory system: Clear to auscultation. Respiratory effort normal. Cardiovascular system: S1 & S2 heard,  No murmurs  Gastrointestinal system: Abdomen soft, non-tender, nondistended. Normal bowel sounds   Central nervous system: Alert and oriented only to person. No focal neurological deficits. Extremities: No cyanosis, clubbing or edema    Data Reviewed: I have personally reviewed following labs and imaging studies  CBC: Recent Labs  Lab 02/19/19 1752 02/20/19 0323 02/21/19 0329 02/22/19 0305  WBC 4.9 6.9 7.8 4.8  NEUTROABS 3.4  --   --   --   HGB 12.6* 11.7* 11.8* 10.6*  HCT 39.7 35.5* 36.0* 32.2*  MCV 94.1 90.3 89.8 89.9  PLT 159 153 151 638*   Basic Metabolic Panel: Recent Labs  Lab 02/19/19 1752 02/20/19 0323 02/21/19 0329 02/22/19 0305  NA 140 137 136 136  K 3.8 3.6 3.9 3.9  CL 103 101 99 101  CO2 30 29 28 28   GLUCOSE 98 122* 111* 101*  BUN 22 15 13 15   CREATININE 0.78 0.70 0.76  0.71  CALCIUM 8.6* 8.5* 8.5* 8.3*   GFR: Estimated Creatinine Clearance: 77.9 mL/min (by C-G formula based on SCr of 0.71 mg/dL). Liver Function Tests: No results for input(s): AST, ALT, ALKPHOS, BILITOT, PROT, ALBUMIN in the last 168 hours. No results for input(s): LIPASE, AMYLASE in the last 168 hours. No results for input(s): AMMONIA in the last 168 hours. Coagulation Profile: Recent Labs  Lab 02/19/19 1752  INR 0.9   Cardiac Enzymes: Recent Labs  Lab 02/19/19 1752  TROPONINI <0.03   BNP (last 3 results) No results for input(s): PROBNP in the last 8760 hours. HbA1C: No results for input(s): HGBA1C in the last 72 hours. CBG: Recent Labs  Lab 02/20/19 1232  GLUCAP 92   Lipid Profile: No results for input(s): CHOL, HDL, LDLCALC, TRIG, CHOLHDL, LDLDIRECT in the last 72 hours. Thyroid  Function Tests: No results for input(s): TSH, T4TOTAL, FREET4, T3FREE, THYROIDAB in the last 72 hours. Anemia Panel: No results for input(s): VITAMINB12, FOLATE, FERRITIN, TIBC, IRON, RETICCTPCT in the last 72 hours. Urine analysis:    Component Value Date/Time   COLORURINE STRAW (A) 02/19/2019 1708   APPEARANCEUR CLEAR 02/19/2019 1708   LABSPEC 1.008 02/19/2019 1708   PHURINE 7.0 02/19/2019 1708   GLUCOSEU NEGATIVE 02/19/2019 1708   HGBUR NEGATIVE 02/19/2019 1708   BILIRUBINUR NEGATIVE 02/19/2019 1708   KETONESUR NEGATIVE 02/19/2019 1708   PROTEINUR NEGATIVE 02/19/2019 1708   NITRITE NEGATIVE 02/19/2019 1708   LEUKOCYTESUR NEGATIVE 02/19/2019 1708   Sepsis Labs: @LABRCNTIP (procalcitonin:4,lacticidven:4) ) Recent Results (from the past 240 hour(s))  SARS Coronavirus 2 (CEPHEID - Performed in [place] hospital lab), Hosp Order     Status: None   Collection Time: 02/19/19  6:44 PM   Specimen: Nasopharyngeal Swab  Result Value Ref Range Status   SARS Coronavirus 2 NEGATIVE NEGATIVE Final    Comment: (NOTE) If result is NEGATIVE SARS-CoV-2 target nucleic acids are NOT DETECTED. The SARS-CoV-2 RNA is generally detectable in upper and lower  respiratory specimens during the acute phase of infection. The lowest  concentration of SARS-CoV-2 viral copies this assay can detect is 250  copies / mL. A negative result does not preclude SARS-CoV-2 infection  and should not be used as the sole basis for treatment or other  patient management decisions.  A negative result may occur with  improper specimen collection / handling, submission of specimen other  than nasopharyngeal swab, presence of viral mutation(s) within the  areas targeted by this assay, and inadequate number of viral copies  (<250 copies / mL). A negative result must be combined with clinical  observations, patient history, and epidemiological information. If result is POSITIVE SARS-CoV-2 target nucleic acids are  DETECTED. The SARS-CoV-2 RNA is generally detectable in upper and lower  respiratory specimens dur ing the acute phase of infection.  Positive  results are indicative of active infection with SARS-CoV-2.  Clinical  correlation with patient history and other diagnostic information is  necessary to determine patient infection status.  Positive results do  not rule out bacterial infection or co-infection with other viruses. If result is PRESUMPTIVE POSTIVE SARS-CoV-2 nucleic acids MAY BE PRESENT.   A presumptive positive result was obtained on the submitted specimen  and confirmed on repeat testing.  While 2019 novel coronavirus  (SARS-CoV-2) nucleic acids may be present in the submitted sample  additional confirmatory testing may be necessary for epidemiological  and / or clinical management purposes  to differentiate between  SARS-CoV-2 and other Sarbecovirus currently known to  infect humans.  If clinically indicated additional testing with an alternate test  methodology [phone]) is advised. The SARS-CoV-2 RNA is generally  detectable in upper and lower respiratory sp ecimens during the acute  phase of infection. The expected result is Negative. Fact Sheet for Patients:  StrictlyIdeas.no Fact Sheet for Healthcare Providers: BankingDealers.co.za This test is not yet approved or cleared by the [place] FDA and has been authorized for detection and/or diagnosis of SARS-CoV-2 by FDA under an Emergency Use Authorization (EUA).  This EUA will remain in effect (meaning this test can be used) for the duration of the COVID-19 declaration under Section 564(b)(1) of the Act, 21 U.S.C. section 360bbb-3(b)(1), unless the authorization is terminated or revoked sooner. Performed at [facility], [place] [address], [place], [place] [address]   Surgical pcr screen     Status: Abnormal   Collection Time: 02/20/19  3:39 AM    Specimen: Nasal Mucosa; Nasal Swab  Result Value Ref Range Status   MRSA, PCR NEGATIVE NEGATIVE Final   Staphylococcus aureus POSITIVE (A) NEGATIVE Final    Comment: (NOTE) The Xpert SA Assay (FDA approved for NASAL specimens in patients 71 years of age and older), is one component of a comprehensive surveillance program. It is not intended to diagnose infection nor to guide or monitor treatment. Performed at [facility], [place] [address], [place], [place] [address]          Radiology Studies: Dg C-arm 1-60 Min  Result Date: 02/20/2019 CLINICAL DATA:  Fracture EXAM: DG C-ARM 61-120 MIN COMPARISON:  02/19/2019 FINDINGS: Two images were provided for a total of 1 minutes and 8 seconds of fluoroscopy time during percutaneous pinning of the right hip. The alignment is stable. The hardware is intact. IMPRESSION: Status post percutaneous pinning of the right hip. Electronically Signed   By: Constance Holster M.D.   On: 02/20/2019 14:53   Dg Hip Operative Unilat W Or W/o Pelvis Right  Result Date: 02/20/2019 CLINICAL DATA:  Fracture EXAM: OPERATIVE right HIP (WITH PELVIS IF PERFORMED) to VIEWS TECHNIQUE: Fluoroscopic spot image(s) were submitted for interpretation post-operatively. COMPARISON:  02/19/2019 FINDINGS: The patient has undergone percutaneous pinning of the right hip. The alignment appears stable. Hardware is intact. Two images were obtained. The total fluoroscopy time was 1 minutes and 8 seconds. IMPRESSION: Fluoroscopy for right-sided percutaneous pinning of the femur. Electronically Signed   By: Constance Holster M.D.   On: 02/20/2019 14:53      Scheduled Meds: . docusate sodium  100 mg Oral BID  . enoxaparin (LOVENOX) injection  40 mg Subcutaneous Q24H  . feeding supplement (ENSURE ENLIVE)  237 mL Oral BID BM  . multivitamin with minerals  1 tablet Oral Daily   Continuous Infusions: . sodium chloride 75 mL/hr at 02/21/19 2037  . methocarbamol (ROBAXIN) IV        LOS: 3 days      Bonnell Public, MD [facility] Pager: [email or web address] Password [facility] 02/22/2019, 11:20 AM

## 2019-02-22 NOTE — Care Management Important Message (Signed)
Important Message  Patient Details  Name: [name] [name] MRN: [id number] Date of Birth: 1931/06/14   Medicare Important Message Given:  Yes    Memory Argue 02/22/2019, 1:47 PM

## 2019-02-22 NOTE — Progress Notes (Signed)
Nutrition Follow-up  RD working remotely.  DOCUMENTATION CODES:   Not applicable  INTERVENTION:   -Continue Ensure Enlive po BID, each supplement provides 350 kcal and 20 grams of protein -Continue MVI with minerals daily  NUTRITION DIAGNOSIS:   Increased nutrient needs related to post-op healing as evidenced by estimated needs.  Ongoing  GOAL:   Patient will meet greater than or equal to 90% of their needs  Progressing  MONITOR:   Diet advancement, PO intake, Supplement acceptance, Labs, Weight trends, Skin, I & O's  REASON FOR ASSESSMENT:   Consult Hip fracture protocol  ASSESSMENT:   [name] [name] is a 85 y.o. male with dementia, hypertension, hyperlipidemia, sleep apnea was brought to the ER the patient was found to have weakness fall at the nursing station at the living facility.  Patient fell towards his right side and started having pain in the right hip and was brought to the ER.  6/15- s/p PROCEDURE: Open treatment of proximal end of femur, neck with internal fixation. CPT [phone].  Reviewed I/O's: -5 ml x 24 hours and -456 ml since admission  UOP: 725 ml x 24 hours  Pt with good appetite; noted meal completion 60-100%. Pt is accepting both Ensure and MVI supplements.   Pt with no documented BM since admission. Colace ordered 02/20/19 and pt has PRN mag citrate ordered.   Plan for SNF at discharge.  Labs reviewed: CBGS: 92  Diet Order:   Diet Order            Diet regular Room service appropriate? Yes; Fluid consistency: Thin  Diet effective now              EDUCATION NEEDS:   Not appropriate for education at this time  Skin:  Skin Assessment: Skin Integrity Issues: Skin Integrity Issues:: Incisions Incisions: closed rt hip incision  Last BM:  Unknown  Height:   Ht Readings from Last 1 Encounters:  02/19/19 6\' 2"  (1.88 m)    Weight:   Wt Readings from Last 1 Encounters:  02/19/19 81.6 kg    Ideal Body Weight:  86.4  kg  BMI:  Body mass index is 23.11 kg/m.  Estimated Nutritional Needs:   Kcal:  1800-2000  Protein:  90-105 grams  Fluid:  > 1.8 L    [name], RD, LDN, [place] Registered Dietitian II Certified Diabetes Care and Education Specialist Pager: [phone] After hours Pager: [phone]

## 2019-02-22 NOTE — Progress Notes (Signed)
Attempted to call patient's significant other Mathis Bud with no answer. Will try again later to give him some updates.

## 2019-02-23 LAB — CBC
HCT: 33.2 % — ABNORMAL LOW (ref 39.0–52.0)
Hemoglobin: 10.7 g/dL — ABNORMAL LOW (ref 13.0–17.0)
MCH: 29 pg (ref 26.0–34.0)
MCHC: 32.2 g/dL (ref 30.0–36.0)
MCV: 90 fL (ref 80.0–100.0)
Platelets: 155 10*3/uL (ref 150–400)
RBC: 3.69 MIL/uL — ABNORMAL LOW (ref 4.22–5.81)
RDW: 13.2 % (ref 11.5–15.5)
WBC: 5 10*3/uL (ref 4.0–10.5)
nRBC: 0 % (ref 0.0–0.2)

## 2019-02-23 LAB — SARS CORONAVIRUS 2: SARS Coronavirus 2: NOT DETECTED

## 2019-02-23 NOTE — TOC Progression Note (Signed)
Transition of Care [facility]) - Progression Note    Patient Details  Name: [name] [name] MRN: [id number] Date of Birth: 03/07/1931  Transition of Care [facility]) CM/SW [place], [place] Phone Number: 02/23/2019, 1:57 PM  Clinical Narrative:    Pt HCPOA has selected [facility].  Liaison with Signe Colt is aware.  COVID screen pending for admission. PASRR website currently down.    Expected Discharge Plan: [place] Barriers to Discharge: Continued Medical Work up, [place] Forensic scientist)  Expected Discharge Plan and Services Expected Discharge Plan: [place] In-house Referral: Clinical Social Work   Post Acute Care Choice: [place] Living arrangements for the past 2 months: [place]                     Social Determinants of Health (SDOH) Interventions    Readmission Risk Interventions No flowsheet data found.

## 2019-02-23 NOTE — Progress Notes (Signed)
Physical Therapy Treatment Patient Details Name: [name] [name] MRN: [id number] DOB: 20-Dec-1930 Today's Date: 02/23/2019    History of Present Illness Pt is an 85 y.o. male admitted from [facility] memory care unit on 02/19/19 after fall sustaining R femoral neck fx. S/p R hip pinning 6/15. PMH includes dementia, OSA, arthritis, anxiety.    PT Comments    Pt seen for progression of gait.  Pt required min A to min guard for transfers, depending on height of seat; min guard for gait, Min A for negotiating RW around obstacles and for turns. Pt requires frequent cues to redirect to task throughout session.  PT will continue to follow acutely.    Follow Up Recommendations  SNF;Supervision/Assistance - 24 hour(return to memory care unit)     Equipment Recommendations  None recommended by PT    Recommendations for Other Services       Precautions / Restrictions Precautions Precautions: Fall Restrictions Weight Bearing Restrictions: Yes RLE Weight Bearing: Weight bearing as tolerated    Mobility  Bed Mobility Overal bed mobility: Needs Assistance Bed Mobility: Sit to Supine       Sit to supine: Min assist;HOB elevated   General bed mobility comments: Pt required assist to elevate torso to sit at EOB.   Transfers Overall transfer level: Needs assistance Equipment used: Rolling walker (2 wheeled) Transfers: Sit to/from Stand(3 trials) Sit to Stand: Min assist;Min guard         General transfer comment: Min A to power up to stand with RW. tactile cues for safe hand placement. , varied assistance min guard to min depending on seat height.   Ambulation/Gait Ambulation/Gait assistance: Min assist;Min guard(min A to navigate RW around obstacles.) Gait Distance (Feet): 84 Feet Assistive device: Rolling walker (2 wheeled) Gait Pattern/deviations: Step-through pattern;Decreased stride length;Decreased weight shift to right;Antalgic;Trunk flexed Gait velocity: Decreased    General Gait Details: frequent redirection to task; cues for upright posture; cues to not leave RW to begin furniture walking   Stairs             Wheelchair Mobility    Modified Rankin (Stroke Patients Only)       Balance Overall balance assessment: Needs assistance   Sitting balance-Leahy Scale: Fair     Standing balance support: Single extremity supported Standing balance-Leahy Scale: Poor Standing balance comment: pt able to wash hands at sink, utilized hip leaning into counter for static balance                            Cognition Arousal/Alertness: Awake/alert Behavior During Therapy: Impulsive;Restless Overall Cognitive Status: History of cognitive impairments - at baseline                                 General Comments: H/o dementia; tangential, nonsensical speech at times, not following conversation or answering questions appropriately. Pleasant and agreeable to mobilize. Pt required several cues for redirection due to lack of focus in ADL. Pt instructed to brush teeth at sink, then began to wash UE.       Exercises      General Comments General comments (skin integrity, edema, etc.): pt needs frequent cues to redirect to functional task or for safety maneuvering RW around obstacles.       Pertinent Vitals/Pain Pain Assessment: No/denies pain Pain Location: R hip Pain Descriptors / Indicators: Grimacing;Guarding Pain Intervention(s): Limited activity within  patient's tolerance;Monitored during session    Home Living                      Prior Function            PT Goals (current goals can now be found in the care plan section) Acute Rehab PT Goals Patient Stated Goal: None stated PT Goal Formulation: With patient Time For Goal Achievement: 03/07/19    Frequency    Min 3X/week      PT Plan      Co-evaluation              AM-PAC PT "6 Clicks" Mobility   Outcome Measure  Help needed  turning from your back to your side while in a flat bed without using bedrails?: A Little Help needed moving from lying on your back to sitting on the side of a flat bed without using bedrails?: A Little Help needed moving to and from a bed to a chair (including a wheelchair)?: A Little Help needed standing up from a chair using your arms (e.g., wheelchair or bedside chair)?: A Little Help needed to walk in hospital room?: A Little Help needed climbing 3-5 steps with a railing? : A Little 6 Click Score: 18    End of Session Equipment Utilized During Treatment: Gait belt Activity Tolerance: Patient tolerated treatment well Patient left: with call bell/phone within reach;in chair;with chair alarm set Nurse Communication: Mobility status PT Visit Diagnosis: Other abnormalities of gait and mobility (R26.89)     Time: 1344-1410 PT Time Calculation (min) (ACUTE ONLY): 26 min  Charges:  $Gait Training: 8-22 mins $Therapeutic Exercise: 8-22 mins                    Kerin Perna, PTA Acute Rehab [phone]  Shelbie Hutching 02/23/2019, 2:27 PM

## 2019-02-23 NOTE — Progress Notes (Signed)
PROGRESS NOTE    [name] [name]   TMH:[id number]  DOB: Sep 07, 1931  DOA: 02/19/2019 PCP: Leanna Battles, MD   Brief Narrative:  [name] [name] is an 85 y/o male from [facility] memory care with h/o dementia, OSA, HLD who presents after a fall with right sided hip pain. He is found to have a right hip fracture.   Subjective: No significant changes. Remains confused No significant history from patient   Assessment & Plan:   Principal Problem:   Closed displaced fracture of right femoral neck  - ORIF on 6/15 - OT recommends SNF-  social work aware- no note from OT yet - management per ortho team- WBAT, Lovenox,  f/u in 2 wks with Dr Erlinda Hong  Active Problems: Dementia: -Remains significantly confused.     Mild ABLA - follow  OSA (per chart) -  ordered CPAP for bedtime     Time spent in minutes: 25 DVT prophylaxis: per ortho after surgery Code Status: Full code Family Communication:  Disposition Plan: f/u on PT eval   Consultants:   ortho Procedures:   ORIF Antimicrobials:  Anti-infectives (From admission, onward)   Start     Dose/Rate Route Frequency Ordered Stop   02/20/19 2200  ceFAZolin (ANCEF) IVPB 2g/100 mL premix     2 g 200 mL/hr over 30 Minutes Intravenous Every 6 hours 02/20/19 1609 02/21/19 1559   02/20/19 0600  ceFAZolin (ANCEF) IVPB 2g/100 mL premix     2 g 200 mL/hr over 30 Minutes Intravenous On call to O.R. 02/19/19 2340 02/20/19 1404       Objective: Vitals:   02/22/19 1356 02/22/19 2039 02/23/19 0554 02/23/19 1438  BP: 112/74 (!) 156/73 (!) 153/77 113/74  Pulse: 62 84 62 86  Resp: 14 17 17 17   Temp: (!) 97.5 F (36.4 C) 98 F (36.7 C) 98.9 F (37.2 C) 98.2 F (36.8 C)  TempSrc: Oral   Oral  SpO2: 100% 95% 97% 93%  Weight:      Height:        Intake/Output Summary (Last 24 hours) at 02/23/2019 1920 Last data filed at 02/23/2019 1439 Gross per 24 hour  Intake 337 ml  Output 1450 ml  Net -1113 ml   Filed Weights   02/19/19 1521  Weight: 81.6 kg    Examination: General exam: Confused. Playing with the condom catheter HEENT: PERRLA, oral mucosa moist, no sclera icterus or thrush Respiratory system: Clear to auscultation. Respiratory effort normal. Cardiovascular system: S1 & S2 heard,  No murmurs  Gastrointestinal system: Abdomen soft, non-tender, nondistended. Normal bowel sounds   Central nervous system: Alert and oriented only to person. No focal neurological deficits. Extremities: No cyanosis, clubbing or edema    Data Reviewed: I have personally reviewed following labs and imaging studies  CBC: Recent Labs  Lab 02/19/19 1752 02/20/19 0323 02/21/19 0329 02/22/19 0305 02/23/19 0232  WBC 4.9 6.9 7.8 4.8 5.0  NEUTROABS 3.4  --   --   --   --   HGB 12.6* 11.7* 11.8* 10.6* 10.7*  HCT 39.7 35.5* 36.0* 32.2* 33.2*  MCV 94.1 90.3 89.8 89.9 90.0  PLT 159 153 151 140* 921   Basic Metabolic Panel: Recent Labs  Lab 02/19/19 1752 02/20/19 0323 02/21/19 0329 02/22/19 0305  NA 140 137 136 136  K 3.8 3.6 3.9 3.9  CL 103 101 99 101  CO2 30 29 28 28   GLUCOSE 98 122* 111* 101*  BUN 22 15 13 15   CREATININE  0.78 0.70 0.76 0.71  CALCIUM 8.6* 8.5* 8.5* 8.3*   GFR: Estimated Creatinine Clearance: 77.9 mL/min (by C-G formula based on SCr of 0.71 mg/dL). Liver Function Tests: No results for input(s): AST, ALT, ALKPHOS, BILITOT, PROT, ALBUMIN in the last 168 hours. No results for input(s): LIPASE, AMYLASE in the last 168 hours. No results for input(s): AMMONIA in the last 168 hours. Coagulation Profile: Recent Labs  Lab 02/19/19 1752  INR 0.9   Cardiac Enzymes: Recent Labs  Lab 02/19/19 1752  TROPONINI <0.03   BNP (last 3 results) No results for input(s): PROBNP in the last 8760 hours. HbA1C: No results for input(s): HGBA1C in the last 72 hours. CBG: Recent Labs  Lab 02/20/19 1232  GLUCAP 92   Lipid Profile: No results for input(s): CHOL, HDL, LDLCALC, TRIG, CHOLHDL,  LDLDIRECT in the last 72 hours. Thyroid Function Tests: No results for input(s): TSH, T4TOTAL, FREET4, T3FREE, THYROIDAB in the last 72 hours. Anemia Panel: No results for input(s): VITAMINB12, FOLATE, FERRITIN, TIBC, IRON, RETICCTPCT in the last 72 hours. Urine analysis:    Component Value Date/Time   COLORURINE STRAW (A) 02/19/2019 1708   APPEARANCEUR CLEAR 02/19/2019 1708   LABSPEC 1.008 02/19/2019 1708   PHURINE 7.0 02/19/2019 1708   GLUCOSEU NEGATIVE 02/19/2019 1708   HGBUR NEGATIVE 02/19/2019 1708   BILIRUBINUR NEGATIVE 02/19/2019 1708   KETONESUR NEGATIVE 02/19/2019 1708   PROTEINUR NEGATIVE 02/19/2019 1708   NITRITE NEGATIVE 02/19/2019 1708   LEUKOCYTESUR NEGATIVE 02/19/2019 1708   Sepsis Labs: @LABRCNTIP (procalcitonin:4,lacticidven:4) ) Recent Results (from the past 240 hour(s))  SARS Coronavirus 2 (CEPHEID - Performed in [place] hospital lab), Hosp Order     Status: None   Collection Time: 02/19/19  6:44 PM   Specimen: Nasopharyngeal Swab  Result Value Ref Range Status   SARS Coronavirus 2 NEGATIVE NEGATIVE Final    Comment: (NOTE) If result is NEGATIVE SARS-CoV-2 target nucleic acids are NOT DETECTED. The SARS-CoV-2 RNA is generally detectable in upper and lower  respiratory specimens during the acute phase of infection. The lowest  concentration of SARS-CoV-2 viral copies this assay can detect is 250  copies / mL. A negative result does not preclude SARS-CoV-2 infection  and should not be used as the sole basis for treatment or other  patient management decisions.  A negative result may occur with  improper specimen collection / handling, submission of specimen other  than nasopharyngeal swab, presence of viral mutation(s) within the  areas targeted by this assay, and inadequate number of viral copies  (<250 copies / mL). A negative result must be combined with clinical  observations, patient history, and epidemiological information. If result is POSITIVE  SARS-CoV-2 target nucleic acids are DETECTED. The SARS-CoV-2 RNA is generally detectable in upper and lower  respiratory specimens dur ing the acute phase of infection.  Positive  results are indicative of active infection with SARS-CoV-2.  Clinical  correlation with patient history and other diagnostic information is  necessary to determine patient infection status.  Positive results do  not rule out bacterial infection or co-infection with other viruses. If result is PRESUMPTIVE POSTIVE SARS-CoV-2 nucleic acids MAY BE PRESENT.   A presumptive positive result was obtained on the submitted specimen  and confirmed on repeat testing.  While 2019 novel coronavirus  (SARS-CoV-2) nucleic acids may be present in the submitted sample  additional confirmatory testing may be necessary for epidemiological  and / or clinical management purposes  to differentiate between  SARS-CoV-2 and other Sarbecovirus  currently known to infect humans.  If clinically indicated additional testing with an alternate test  methodology [phone]) is advised. The SARS-CoV-2 RNA is generally  detectable in upper and lower respiratory sp ecimens during the acute  phase of infection. The expected result is Negative. Fact Sheet for Patients:  StrictlyIdeas.no Fact Sheet for Healthcare Providers: BankingDealers.co.za This test is not yet approved or cleared by the [place] FDA and has been authorized for detection and/or diagnosis of SARS-CoV-2 by FDA under an Emergency Use Authorization (EUA).  This EUA will remain in effect (meaning this test can be used) for the duration of the COVID-19 declaration under Section 564(b)(1) of the Act, 21 U.S.C. section 360bbb-3(b)(1), unless the authorization is terminated or revoked sooner. Performed at [facility], [place] [address] [place], [place] [address]   Surgical pcr screen     Status: Abnormal    Collection Time: 02/20/19  3:39 AM   Specimen: Nasal Mucosa; Nasal Swab  Result Value Ref Range Status   MRSA, PCR NEGATIVE NEGATIVE Final   Staphylococcus aureus POSITIVE (A) NEGATIVE Final    Comment: (NOTE) The Xpert SA Assay (FDA approved for NASAL specimens in patients 68 years of age and older), is one component of a comprehensive surveillance program. It is not intended to diagnose infection nor to guide or monitor treatment. Performed at [facility], [place] [address] [place], [place] [address]   SARS Coronavirus 2     Status: None   Collection Time: 02/23/19  1:56 PM  Result Value Ref Range Status   SARS Coronavirus 2 NOT DETECTED NOT DETECTED Final    Comment: (NOTE) SARS-CoV-2 target nucleic acids are NOT DETECTED. The SARS-CoV-2 RNA is generally detectable in upper and lower respiratory specimens during the acute phase of infection.  Negative  results do not preclude SARS-CoV-2 infection, do not rule out co-infections with other pathogens, and should not be used as the sole basis for treatment or other patient management decisions.  Negative results must be combined with clinical observations, patient history, and epidemiological information. The expected result is Not Detected. Fact Sheet for Patients: http://www.biofiredefense.com/wp-content/uploads/2020/03/BIOFIRE-COVID -19-patients.pdf Fact Sheet for Healthcare Providers: http://www.biofiredefense.com/wp-content/uploads/2020/03/BIOFIRE-COVID -19-hcp.pdf This test is not yet approved or cleared by the [place] and  has been authorized for detection and/or diagnosis of SARS-CoV-2 by FDA under an Emergency Use Authorization (EUA).  This EUA will remain in effec t (meaning this test can be used) for the duration of  the COVID-19 declaration under Section 564(b)(1) of the Act, 21 U.S.C. section 360bbb-3(b)(1), unless the authorization is terminated or revoked sooner. Performed at [facility], [place] [address], [place], [place] [address]          Radiology Studies: No results found.    Scheduled Meds: . docusate sodium  100 mg Oral BID  . enoxaparin (LOVENOX) injection  40 mg Subcutaneous Q24H  . feeding supplement (ENSURE ENLIVE)  237 mL Oral BID BM  . multivitamin with minerals  1 tablet Oral Daily   Continuous Infusions: . sodium chloride 75 mL/hr at 02/21/19 2037  . methocarbamol (ROBAXIN) IV       LOS: 4 days      Bonnell Public, MD [facility] Pager: [email or web address] Password [facility] 02/23/2019, 7:20 PM

## 2019-02-23 NOTE — TOC Progression Note (Signed)
Transition of Care [facility]) - Progression Note    Patient Details  Name: Keath Ausar Georgiou MRN: [id number] Date of Birth: 09-17-1930  Transition of Care [facility]) CM/SW [place], [place] Phone Number: 02/23/2019, 8:27 AM  Clinical Narrative:    [facility] cannot take pt back until he has first gone to a SNF, CSW has reached out to [place], pt HCPOA to arrange SNF placement and requested a new COVID screen from MD.    Expected Discharge Plan: [place] Barriers to Discharge: Continued Medical Work up, [place] Forensic scientist)  Expected Discharge Plan and Services Expected Discharge Plan: [place] In-house Referral: Clinical Social Work   Post Acute Care Choice: [place] Living arrangements for the past 2 months: [place]   Social Determinants of Health (SDOH) Interventions    Readmission Risk Interventions No flowsheet data found.

## 2019-02-23 NOTE — Progress Notes (Signed)
Occupational Therapy Treatment Patient Details Name: [name] [name] MRN: [id number] DOB: 1931/07/02 Today's Date: 02/23/2019    History of present illness Pt is an 85 y.o. male admitted from [facility] memory care unit on 02/19/19 after fall sustaining R femoral neck fx. S/p R hip pinning 6/15. PMH includes dementia, OSA, arthritis, anxiety.   OT comments  Pt received in bed upon arrival agreeable to engage in sink level ADL. Pt required Min A bed mobilty to sit up right at EOB. Min guard sit to stand transfer, Min guard functional mobility, and min guard for dynamic standing balance for oral care with RW. Pt required several verbal and visual cues for redirections for oral due to lack of focus on instructed task. DC and Freq remains the same. OT will continue to follow acutely.    Follow Up Recommendations  SNF;Supervision/Assistance - 24 hour    Equipment Recommendations       Recommendations for Other Services      Precautions / Restrictions Precautions Precautions: Fall Restrictions Weight Bearing Restrictions: Yes RLE Weight Bearing: Weight bearing as tolerated       Mobility Bed Mobility Overal bed mobility: Needs Assistance Bed Mobility: Sit to Supine       Sit to supine: Min assist;HOB elevated   General bed mobility comments: Pt required assist to elevate torso to sit at EOB.   Transfers Overall transfer level: Needs assistance Equipment used: Rolling walker (2 wheeled) Transfers: Sit to/from Stand Sit to Stand: Min assist         General transfer comment: Min A to power up to stand with RW.     Balance Overall balance assessment: Needs assistance   Sitting balance-Leahy Scale: Fair     Standing balance support: Bilateral upper extremity supported Standing balance-Leahy Scale: Poor                             ADL either performed or assessed with clinical judgement   ADL Overall ADL's : Needs assistance/impaired     Grooming:  Min guard;Wash/dry hands;Wash/dry face;Oral care;Standing Grooming Details (indicate cue type and reason): several cues for redirection                             Functional mobility during ADLs: Min guard       Vision       Perception     Praxis      Cognition Arousal/Alertness: Awake/alert Behavior During Therapy: Impulsive;Restless Overall Cognitive Status: History of cognitive impairments - at baseline                                 General Comments: H/o dementia; tangential, nonsensical speech at times, not following conversation or answering questions appropriately. Pleasant and agreeable to mobilize. Pt required several cues for redirection due to lack of focus in ADL. Pt instructed to brush teeth at sink, then began to wash UE.         Exercises     Shoulder Instructions       General Comments      Pertinent Vitals/ Pain       Pain Assessment: No/denies pain Pain Location: R hip Pain Descriptors / Indicators: Grimacing;Guarding;Sharp Pain Intervention(s): Monitored during session  Home Living  Prior Functioning/Environment              Frequency  Min 2X/week        Progress Toward Goals  OT Goals(current goals can now be found in the care plan section)  Progress towards OT goals: Progressing toward goals  Acute Rehab OT Goals Patient Stated Goal: None stated OT Goal Formulation: Patient unable to participate in goal setting Time For Goal Achievement: 03/07/19 Potential to Achieve Goals: Fair ADL Goals Pt Will Perform Lower Body Dressing: with min assist;with adaptive equipment;sitting/lateral leans Pt Will Transfer to Toilet: with min assist;ambulating;bedside commode Pt Will Perform Tub/Shower Transfer: with min assist;ambulating;shower seat;rolling walker  Plan Discharge plan remains appropriate;Frequency remains appropriate    Co-evaluation                  AM-PAC OT "6 Clicks" Daily Activity     Outcome Measure   Help from another person eating meals?: A Little Help from another person taking care of personal grooming?: None Help from another person toileting, which includes using toliet, bedpan, or urinal?: A Little Help from another person bathing (including washing, rinsing, drying)?: A Little Help from another person to put on and taking off regular upper body clothing?: A Little Help from another person to put on and taking off regular lower body clothing?: A Lot(further assessment required in all areas) 6 Click Score: 18    End of Session Equipment Utilized During Treatment: Gait belt;Rolling walker  OT Visit Diagnosis: Unsteadiness on feet (R26.81);Repeated falls (R29.6);Muscle weakness (generalized) (M62.81);Pain Pain - Right/Left: Right Pain - part of body: Hip   Activity Tolerance Other (comment);Patient tolerated treatment well   Patient Left in chair;with call bell/phone within reach;with chair alarm set   Nurse Communication Mobility status;Weight bearing status        Time: 6378-5885 OT Time Calculation (min): 24 min  Charges: OT General Charges $OT Visit: 1 Visit OT Treatments $Self Care/Home Management : 23-37 mins  Minus Breeding, MSOT, OTR/L  Supplemental Rehabilitation Services  [phone]   Marius Ditch 02/23/2019, 1:38 PM

## 2019-02-24 DIAGNOSIS — R52 Pain, unspecified: Secondary | ICD-10-CM | POA: Diagnosis not present

## 2019-02-24 DIAGNOSIS — Z79891 Long term (current) use of opiate analgesic: Secondary | ICD-10-CM | POA: Diagnosis not present

## 2019-02-24 DIAGNOSIS — M6281 Muscle weakness (generalized): Secondary | ICD-10-CM | POA: Diagnosis not present

## 2019-02-24 DIAGNOSIS — G20 Parkinson's disease: Secondary | ICD-10-CM | POA: Diagnosis not present

## 2019-02-24 DIAGNOSIS — Z9842 Cataract extraction status, left eye: Secondary | ICD-10-CM | POA: Diagnosis not present

## 2019-02-24 DIAGNOSIS — E860 Dehydration: Secondary | ICD-10-CM | POA: Diagnosis present

## 2019-02-24 DIAGNOSIS — E861 Hypovolemia: Secondary | ICD-10-CM | POA: Diagnosis present

## 2019-02-24 DIAGNOSIS — S51802A Unspecified open wound of left forearm, initial encounter: Secondary | ICD-10-CM | POA: Diagnosis not present

## 2019-02-24 DIAGNOSIS — N390 Urinary tract infection, site not specified: Secondary | ICD-10-CM | POA: Diagnosis not present

## 2019-02-24 DIAGNOSIS — F411 Generalized anxiety disorder: Secondary | ICD-10-CM | POA: Diagnosis present

## 2019-02-24 DIAGNOSIS — Z885 Allergy status to narcotic agent status: Secondary | ICD-10-CM | POA: Diagnosis not present

## 2019-02-24 DIAGNOSIS — M1990 Unspecified osteoarthritis, unspecified site: Secondary | ICD-10-CM | POA: Diagnosis present

## 2019-02-24 DIAGNOSIS — M25562 Pain in left knee: Secondary | ICD-10-CM | POA: Diagnosis not present

## 2019-02-24 DIAGNOSIS — G4733 Obstructive sleep apnea (adult) (pediatric): Secondary | ICD-10-CM | POA: Diagnosis present

## 2019-02-24 DIAGNOSIS — G4730 Sleep apnea, unspecified: Secondary | ICD-10-CM | POA: Diagnosis not present

## 2019-02-24 DIAGNOSIS — Z7401 Bed confinement status: Secondary | ICD-10-CM | POA: Diagnosis not present

## 2019-02-24 DIAGNOSIS — R5381 Other malaise: Secondary | ICD-10-CM | POA: Diagnosis not present

## 2019-02-24 DIAGNOSIS — Z800 Family history of malignant neoplasm of digestive organs: Secondary | ICD-10-CM | POA: Diagnosis not present

## 2019-02-24 DIAGNOSIS — Z8249 Family history of ischemic heart disease and other diseases of the circulatory system: Secondary | ICD-10-CM | POA: Diagnosis not present

## 2019-02-24 DIAGNOSIS — G8918 Other acute postprocedural pain: Secondary | ICD-10-CM | POA: Diagnosis not present

## 2019-02-24 DIAGNOSIS — B372 Candidiasis of skin and nail: Secondary | ICD-10-CM | POA: Diagnosis not present

## 2019-02-24 DIAGNOSIS — Z888 Allergy status to other drugs, medicaments and biological substances status: Secondary | ICD-10-CM | POA: Diagnosis not present

## 2019-02-24 DIAGNOSIS — Y92129 Unspecified place in nursing home as the place of occurrence of the external cause: Secondary | ICD-10-CM | POA: Diagnosis not present

## 2019-02-24 DIAGNOSIS — S72002A Fracture of unspecified part of neck of left femur, initial encounter for closed fracture: Secondary | ICD-10-CM | POA: Diagnosis not present

## 2019-02-24 DIAGNOSIS — W19XXXA Unspecified fall, initial encounter: Secondary | ICD-10-CM | POA: Diagnosis not present

## 2019-02-24 DIAGNOSIS — S72032A Displaced midcervical fracture of left femur, initial encounter for closed fracture: Secondary | ICD-10-CM | POA: Diagnosis not present

## 2019-02-24 DIAGNOSIS — Z1159 Encounter for screening for other viral diseases: Secondary | ICD-10-CM | POA: Diagnosis not present

## 2019-02-24 DIAGNOSIS — Z03818 Encounter for observation for suspected exposure to other biological agents ruled out: Secondary | ICD-10-CM | POA: Diagnosis not present

## 2019-02-24 DIAGNOSIS — R296 Repeated falls: Secondary | ICD-10-CM | POA: Diagnosis not present

## 2019-02-24 DIAGNOSIS — S7292XA Unspecified fracture of left femur, initial encounter for closed fracture: Secondary | ICD-10-CM | POA: Diagnosis not present

## 2019-02-24 DIAGNOSIS — F419 Anxiety disorder, unspecified: Secondary | ICD-10-CM | POA: Diagnosis not present

## 2019-02-24 DIAGNOSIS — R8289 Other abnormal findings on cytological and histological examination of urine: Secondary | ICD-10-CM | POA: Diagnosis not present

## 2019-02-24 DIAGNOSIS — W19XXXD Unspecified fall, subsequent encounter: Secondary | ICD-10-CM | POA: Diagnosis not present

## 2019-02-24 DIAGNOSIS — F05 Delirium due to known physiological condition: Secondary | ICD-10-CM | POA: Diagnosis not present

## 2019-02-24 DIAGNOSIS — F0391 Unspecified dementia with behavioral disturbance: Secondary | ICD-10-CM | POA: Diagnosis not present

## 2019-02-24 DIAGNOSIS — R451 Restlessness and agitation: Secondary | ICD-10-CM | POA: Diagnosis not present

## 2019-02-24 DIAGNOSIS — Z823 Family history of stroke: Secondary | ICD-10-CM | POA: Diagnosis not present

## 2019-02-24 DIAGNOSIS — S72001A Fracture of unspecified part of neck of right femur, initial encounter for closed fracture: Secondary | ICD-10-CM | POA: Diagnosis not present

## 2019-02-24 DIAGNOSIS — Z880 Allergy status to penicillin: Secondary | ICD-10-CM | POA: Diagnosis not present

## 2019-02-24 DIAGNOSIS — I700 Atherosclerosis of aorta: Secondary | ICD-10-CM | POA: Diagnosis not present

## 2019-02-24 DIAGNOSIS — Z1612 Extended spectrum beta lactamase (ESBL) resistance: Secondary | ICD-10-CM | POA: Diagnosis not present

## 2019-02-24 DIAGNOSIS — F0280 Dementia in other diseases classified elsewhere without behavioral disturbance: Secondary | ICD-10-CM | POA: Diagnosis not present

## 2019-02-24 DIAGNOSIS — W07XXXA Fall from chair, initial encounter: Secondary | ICD-10-CM | POA: Diagnosis present

## 2019-02-24 DIAGNOSIS — M25552 Pain in left hip: Secondary | ICD-10-CM | POA: Diagnosis not present

## 2019-02-24 DIAGNOSIS — E871 Hypo-osmolality and hyponatremia: Secondary | ICD-10-CM | POA: Diagnosis not present

## 2019-02-24 DIAGNOSIS — S72001D Fracture of unspecified part of neck of right femur, subsequent encounter for closed fracture with routine healing: Secondary | ICD-10-CM | POA: Diagnosis not present

## 2019-02-24 DIAGNOSIS — K219 Gastro-esophageal reflux disease without esophagitis: Secondary | ICD-10-CM | POA: Diagnosis present

## 2019-02-24 DIAGNOSIS — F329 Major depressive disorder, single episode, unspecified: Secondary | ICD-10-CM | POA: Diagnosis present

## 2019-02-24 DIAGNOSIS — E785 Hyperlipidemia, unspecified: Secondary | ICD-10-CM | POA: Diagnosis present

## 2019-02-24 DIAGNOSIS — R627 Adult failure to thrive: Secondary | ICD-10-CM | POA: Diagnosis not present

## 2019-02-24 DIAGNOSIS — R21 Rash and other nonspecific skin eruption: Secondary | ICD-10-CM | POA: Diagnosis not present

## 2019-02-24 DIAGNOSIS — Z961 Presence of intraocular lens: Secondary | ICD-10-CM | POA: Diagnosis present

## 2019-02-24 DIAGNOSIS — F0390 Unspecified dementia without behavioral disturbance: Secondary | ICD-10-CM | POA: Diagnosis present

## 2019-02-24 DIAGNOSIS — G4700 Insomnia, unspecified: Secondary | ICD-10-CM | POA: Diagnosis not present

## 2019-02-24 DIAGNOSIS — L299 Pruritus, unspecified: Secondary | ICD-10-CM | POA: Diagnosis not present

## 2019-02-24 DIAGNOSIS — Z79899 Other long term (current) drug therapy: Secondary | ICD-10-CM | POA: Diagnosis not present

## 2019-02-24 DIAGNOSIS — R4189 Other symptoms and signs involving cognitive functions and awareness: Secondary | ICD-10-CM | POA: Diagnosis not present

## 2019-02-24 DIAGNOSIS — Z9841 Cataract extraction status, right eye: Secondary | ICD-10-CM | POA: Diagnosis not present

## 2019-02-24 DIAGNOSIS — R410 Disorientation, unspecified: Secondary | ICD-10-CM | POA: Diagnosis not present

## 2019-02-24 DIAGNOSIS — Z4789 Encounter for other orthopedic aftercare: Secondary | ICD-10-CM | POA: Diagnosis not present

## 2019-02-24 DIAGNOSIS — R262 Difficulty in walking, not elsewhere classified: Secondary | ICD-10-CM | POA: Diagnosis not present

## 2019-02-24 DIAGNOSIS — D649 Anemia, unspecified: Secondary | ICD-10-CM | POA: Diagnosis present

## 2019-02-24 DIAGNOSIS — M2550 Pain in unspecified joint: Secondary | ICD-10-CM | POA: Diagnosis not present

## 2019-02-24 MED ORDER — HYDROCODONE-ACETAMINOPHEN 5-325 MG PO TABS: 1.0000 | ORAL_TABLET | ORAL | 0 refills | Status: DC | PRN

## 2019-02-24 MED ORDER — ADULT MULTIVITAMIN W/MINERALS CH: 1.0000 | ORAL_TABLET | Freq: Every day | ORAL | 0 refills | Status: AC

## 2019-02-24 MED ORDER — METHOCARBAMOL 500 MG PO TABS: 500.0000 mg | ORAL_TABLET | Freq: Four times a day (QID) | ORAL | 0 refills | Status: DC | PRN

## 2019-02-24 MED ORDER — ACETAMINOPHEN 325 MG PO TABS: 325.0000 mg | ORAL_TABLET | Freq: Four times a day (QID) | ORAL | 0 refills | Status: DC | PRN

## 2019-02-24 MED ORDER — POLYETHYLENE GLYCOL 3350 17 G PO PACK: 17.0000 g | PACK | Freq: Every day | ORAL | 0 refills | Status: DC | PRN

## 2019-02-24 MED ORDER — ENSURE ENLIVE PO LIQD: 237.0000 mL | Freq: Two times a day (BID) | ORAL | 12 refills | Status: AC

## 2019-02-24 MED ORDER — DOCUSATE SODIUM 100 MG PO CAPS: 100.0000 mg | ORAL_CAPSULE | Freq: Two times a day (BID) | ORAL | 0 refills | Status: DC

## 2019-02-24 NOTE — Care Management Important Message (Signed)
Important Message  Patient Details  Name: [name] [name] MRN: [id number] Date of Birth: 1931-02-05   Medicare Important Message Given:  Yes     Memory Argue 02/24/2019, 2:57 PM

## 2019-02-24 NOTE — Progress Notes (Signed)
Pt discharged to [facility] in stable condition. Transported by ptar

## 2019-02-24 NOTE — TOC Progression Note (Signed)
Transition of Care [facility]) - Progression Note    Patient Details  Name: [name] [name] MRN: [id number] Date of Birth: Aug 15, 1931  Transition of Care [facility]) CM/SW [place], [place] Phone Number: 02/24/2019, 10:07 AM  Clinical Narrative:    COVID negative result received, if pt stable for discharge will dc to [facility].   Expected Discharge Plan: [place] Barriers to Discharge: Continued Medical Work up, [place] Forensic scientist)  Expected Discharge Plan and Services Expected Discharge Plan: [place] In-house Referral: Clinical Social Work   Post Acute Care Choice: [place] Living arrangements for the past 2 months: [place]                   Social Determinants of Health (SDOH) Interventions    Readmission Risk Interventions No flowsheet data found.

## 2019-02-24 NOTE — Plan of Care (Signed)

## 2019-02-24 NOTE — TOC Transition Note (Signed)
Transition of Care [facility]) - CM/SW Discharge Note   Patient Details  Name: [name] [name] MRN: [id number] Date of Birth: 07-24-1931  Transition of Care [facility]) CM/SW Contact:  [place], [employer] Phone Number: 02/24/2019, 1:36 PM   Clinical Narrative:    Dc summary complete, prescription printed and signed, RN aware. [name] [name] called and aware of discharge plans as well.  PTAR called for 3pm.   Final next level of care: Skilled Nursing Facility Barriers to Discharge: Barriers Resolved   Patient Goals and CMS Choice   CMS Medicare.gov Compare Post Acute Care list provided to:: Patient Represenative (must comment)([name] [name]) Choice offered to / list presented to : [place] / Guardian  Discharge Placement PASRR number recieved: 02/24/19            Patient chooses bed at: [facility] Patient to be transferred to facility by: [place] Name of family member notified: pt [name] [name] Patient and family notified of of transfer: 02/24/19  Discharge Plan and Services In-house Referral: Clinical Social Work   Post Acute Care Choice: [place]                               Social Determinants of Health (SDOH) Interventions     Readmission Risk Interventions No flowsheet data found.

## 2019-02-24 NOTE — Social Work (Signed)
Clinical Social Worker facilitated patient discharge including contacting patient family and facility to confirm patient discharge plans.  Clinical information faxed to facility and family agreeable with plan.  CSW arranged ambulance transport via [place] to [place] RN to call [phone]  with report prior to discharge.  Clinical Social Worker will sign off for now as social work intervention is no longer needed. Please consult [place] again if new need arises.  Westley Hummer, MSW, [place] Social Worker [phone]

## 2019-02-24 NOTE — Discharge Summary (Signed)
Physician Discharge Summary  Patient ID: [name] [name] MRN: [id number] DOB/AGE: 83-Jun-1932 83 y.o.  Admit date: 02/19/2019 Discharge date: 02/24/2019  Admission Diagnoses:  Discharge Diagnoses:  Principal Problem:   Closed displaced fracture of right femoral neck ([place]) Active Problems:   OBSTRUCTIVE SLEEP APNEA   Dementia ([place])   Closed right hip fracture [facility])   Discharged Condition: fair  Hospital Course: [name] [name] is an 85 year old male from [facility] memory care with history of dementia, OSA, HLD who presents after a fall with right sided hip pain. Patient was found to have a right hip fracture.  Patient was admitted for further assessment and management.  Today team directed patient's care.  Patient underwent open treatment of the proximal and of femur, neck with internal fixation on 02/20/2019.  Patient will be discharged to skilled nursing facility today.  Patient will follow with her primary care provider and orthopedic surgery team within 1 week of discharge.    Closed displaced fracture of right femoral neck: -Patient underwent open reduction and internal fixation on February 20, 2019.   -Patient be discharged to skilled nursing facility today.  Dementia: Continue to manage expectantly.       OSA (per chart) - CPAP for bedtime  Consults: orthopedic surgery  Treatments: On 02/20/2019, patient underwent open treatment of proximal end of femur, neck with internal fixation.  Discharge Exam: Blood pressure (!) 160/91, pulse 60, temperature 97.7 F (36.5 C), temperature source Oral, resp. rate 18, height 6\' 2"  (1.88 m), weight 81.6 kg, SpO2 99 %.  Disposition: Discharge disposition: 03-Skilled Nursing Facility   Discharge Instructions    Diet - low sodium heart healthy   Complete by: As directed    Increase activity slowly   Complete by: As directed      Allergies as of 02/24/2019      Reactions   Codeine    Other reaction(s): Unknown   Penicillins  Rash   Ranitidine    Other reaction(s): Unknown   Amoxicillin Rash      Medication List    STOP taking these medications   cetirizine 10 MG tablet Commonly known as: ZYRTEC   doxycycline 100 MG tablet Commonly known as: VIBRA-TABS   hydrOXYzine 25 MG tablet Commonly known as: ATARAX/VISTARIL   pravastatin 10 MG tablet Commonly known as: PRAVACHOL   Vitamin D 50 MCG (2000 UT) tablet     TAKE these medications   acetaminophen 325 MG tablet Commonly known as: TYLENOL Take 1-2 tablets (325-650 mg total) by mouth every 6 (six) hours as needed for mild pain (pain score 1-3 or temp > 100.5). What changed:   how much to take  reasons to take this   docusate sodium 100 MG capsule Commonly known as: COLACE Take 1 capsule (100 mg total) by mouth 2 (two) times daily.   feeding supplement (ENSURE ENLIVE) Liqd Take 237 mLs by mouth 2 (two) times daily between meals.   HYDROcodone-acetaminophen 5-325 MG tablet Commonly known as: NORCO/VICODIN Take 1-2 tablets by mouth every 4 (four) hours as needed for moderate pain (pain score 4-6).   methocarbamol 500 MG tablet Commonly known as: ROBAXIN Take 1 tablet (500 mg total) by mouth every 6 (six) hours as needed for muscle spasms.   multivitamin with minerals Tabs tablet Take 1 tablet by mouth daily. Start taking on: February 25, 2019   polyethylene glycol 17 g packet Commonly known as: MIRALAX / GLYCOLAX Take 17 g by mouth daily as needed for  mild constipation.       Contact information for follow-up providers    Leandrew Koyanagi, MD In 2 weeks.   Specialty: Orthopedic Surgery Why: For suture removal, For wound re-check Contact information: [place] [address] [phone]            Contact information for after-discharge care    Destination    [facility] .   Service: Skilled Nursing Contact information: [address] [place]  [place] [phone]                  Signed: Bonnell Public 02/24/2019, 12:20 PM

## 2019-02-28 DIAGNOSIS — R262 Difficulty in walking, not elsewhere classified: Secondary | ICD-10-CM | POA: Diagnosis not present

## 2019-02-28 DIAGNOSIS — S72001D Fracture of unspecified part of neck of right femur, subsequent encounter for closed fracture with routine healing: Secondary | ICD-10-CM | POA: Diagnosis not present

## 2019-02-28 DIAGNOSIS — M6281 Muscle weakness (generalized): Secondary | ICD-10-CM | POA: Diagnosis not present

## 2019-02-28 DIAGNOSIS — F0390 Unspecified dementia without behavioral disturbance: Secondary | ICD-10-CM | POA: Diagnosis not present

## 2019-02-28 DIAGNOSIS — G4733 Obstructive sleep apnea (adult) (pediatric): Secondary | ICD-10-CM | POA: Diagnosis not present

## 2019-02-28 DIAGNOSIS — R4189 Other symptoms and signs involving cognitive functions and awareness: Secondary | ICD-10-CM | POA: Diagnosis not present

## 2019-02-28 DIAGNOSIS — M25562 Pain in left knee: Secondary | ICD-10-CM | POA: Diagnosis not present

## 2019-03-01 DIAGNOSIS — F0391 Unspecified dementia with behavioral disturbance: Secondary | ICD-10-CM | POA: Diagnosis not present

## 2019-03-01 DIAGNOSIS — M6281 Muscle weakness (generalized): Secondary | ICD-10-CM | POA: Diagnosis not present

## 2019-03-01 DIAGNOSIS — F05 Delirium due to known physiological condition: Secondary | ICD-10-CM | POA: Diagnosis not present

## 2019-03-01 DIAGNOSIS — R410 Disorientation, unspecified: Secondary | ICD-10-CM | POA: Diagnosis not present

## 2019-03-01 DIAGNOSIS — R8289 Other abnormal findings on cytological and histological examination of urine: Secondary | ICD-10-CM | POA: Diagnosis not present

## 2019-03-02 ENCOUNTER — Other Ambulatory Visit: Payer: Self-pay | Admitting: *Deleted

## 2019-03-02 DIAGNOSIS — F0391 Unspecified dementia with behavioral disturbance: Secondary | ICD-10-CM | POA: Diagnosis not present

## 2019-03-02 DIAGNOSIS — S51802A Unspecified open wound of left forearm, initial encounter: Secondary | ICD-10-CM | POA: Diagnosis not present

## 2019-03-02 NOTE — Patient Outreach (Signed)
Member assessed for potential [facility] Care Management needs as a benefit of  [place] [employer].  Member is currently at [facility] receiving rehab therapy.   Member discussed in weekly telephonic IDT meeting with [facility]  facility staff, [facility] UM team, and writer.  [facility] discharge planner reports disposition plan will be to return to [facility] memory care at [facility] completion.   Writer to sign off for now. No identifiable [facility] Care Management needs at this time.   Marthenia Rolling, MSN-Ed, RN,BSN [place] Acute Care Coordinator [phone] [facility]) [phone]  (Toll free office)

## 2019-03-03 ENCOUNTER — Ambulatory Visit (INDEPENDENT_AMBULATORY_CARE_PROVIDER_SITE_OTHER): Payer: Medicare Other | Admitting: Orthopaedic Surgery

## 2019-03-03 ENCOUNTER — Other Ambulatory Visit: Payer: Self-pay

## 2019-03-03 ENCOUNTER — Ambulatory Visit (INDEPENDENT_AMBULATORY_CARE_PROVIDER_SITE_OTHER): Payer: Medicare Other

## 2019-03-03 DIAGNOSIS — M6281 Muscle weakness (generalized): Secondary | ICD-10-CM | POA: Diagnosis not present

## 2019-03-03 DIAGNOSIS — R262 Difficulty in walking, not elsewhere classified: Secondary | ICD-10-CM | POA: Diagnosis not present

## 2019-03-03 DIAGNOSIS — G8918 Other acute postprocedural pain: Secondary | ICD-10-CM

## 2019-03-03 DIAGNOSIS — F0391 Unspecified dementia with behavioral disturbance: Secondary | ICD-10-CM | POA: Diagnosis not present

## 2019-03-03 DIAGNOSIS — M25562 Pain in left knee: Secondary | ICD-10-CM | POA: Diagnosis not present

## 2019-03-03 DIAGNOSIS — N390 Urinary tract infection, site not specified: Secondary | ICD-10-CM | POA: Diagnosis not present

## 2019-03-03 DIAGNOSIS — R4189 Other symptoms and signs involving cognitive functions and awareness: Secondary | ICD-10-CM | POA: Diagnosis not present

## 2019-03-03 DIAGNOSIS — F05 Delirium due to known physiological condition: Secondary | ICD-10-CM | POA: Diagnosis not present

## 2019-03-03 DIAGNOSIS — Z1612 Extended spectrum beta lactamase (ESBL) resistance: Secondary | ICD-10-CM | POA: Diagnosis not present

## 2019-03-03 NOTE — Progress Notes (Signed)
Post-Op Visit Note   Patient: [name] [name]           Date of Birth: Nov 09, 1930           MRN: [id number] Visit Date: 03/03/2019 PCP: Leanna Battles, MD   Assessment & Plan:  Chief Complaint:  Chief Complaint  Patient presents with  . Follow-up   Visit Diagnoses:  1. Post-op pain     Plan: [name] is 85 years old and status post cannulated hip pinning of the right side.  He returns today for his first postoperative visit.  His incisions are healed with some mild swelling.  His staples were removed and Steri-Strips were placed.  X-rays demonstrate stable fixation alignment of fracture without any collapse.  He looks good from our standpoint.  We will have him continue with physical therapy for strengthening and rehab.  We will see him back in 5 weeks with two-view x-rays of the right hip.  Follow-Up Instructions: Return in about 5 weeks (around 04/07/2019).   Orders:  Orders Placed This Encounter  Procedures  . XR HIP UNILAT W OR W/O PELVIS 2-3 VIEWS RIGHT   No orders of the defined types were placed in this encounter.   Imaging: Xr Hip Unilat W Or W/o Pelvis 2-3 Views Right  Result Date: 03/03/2019 Stable screw fixation of femoral neck fracture   PMFS History: Patient Active Problem List   Diagnosis Date Noted  . Closed displaced fracture of right femoral neck ([place]) 02/19/2019  . Hip fracture ([place]) 02/19/2019  . Closed right hip fracture ([place]) 02/19/2019  . Evaluation by psychiatric service required   . Hypoxia 02/13/2018  . Edema 02/13/2018  . Hypoxemia 02/13/2018  . Dementia ([place]) 02/13/2018  . Altered mental status 02/13/2018  . Dermatitis 10/08/2015  . CHEST PAIN, ATYPICAL 04/11/2010  . Personal history of colon polyps and family history of colon cancer 12/19/2007  . Anxiety state 12/19/2007  . DEPRESSION 12/19/2007  . Bicipital tendinitis, left shoulder 12/19/2007  . ALLERGIC CONJUNCTIVITIS 12/19/2007  . Seasonal allergic rhinitis 12/19/2007   . GERD 12/19/2007  . IBS 12/19/2007   Past Medical History:  Diagnosis Date  . Allergic rhinitis, cause unspecified   . Anxiety state, unspecified   . Arthritis   . Benign neoplasm of colon   . Depressive disorder, not elsewhere classified   . Esophageal reflux   . Hyperlipidemia   . Irritable bowel syndrome   . Obstructive sleep apnea (adult) (pediatric)   . Other chronic allergic conjunctivitis   . Ulcer 1951   stomach    Family History  Problem Relation Age of Onset  . Colon cancer Father   . Stroke Mother   . Heart attack Mother   . Colon cancer Mother   . Rectal cancer Neg Hx   . Stomach cancer Neg Hx     Past Surgical History:  Procedure Laterality Date  . APPENDECTOMY    . CATARACT EXTRACTION W/ INTRAOCULAR LENS  IMPLANT, BILATERAL  2003  . COLONOSCOPY    . ESOPHAGOGASTRODUODENOSCOPY    . HIP PINNING,CANNULATED Right 02/20/2019   Procedure: CANNULATED HIP PINNING;  Surgeon: Leandrew Koyanagi, MD;  Location: [place];  Service: Orthopedics;  Laterality: Right;  . TONSILLECTOMY     Social History   Occupational History  . Not on file  Tobacco Use  . Smoking status: Never Smoker  . Smokeless tobacco: Never Used  Substance and Sexual Activity  . Alcohol use: No  . Drug use: No  .  Sexual activity: Not on file

## 2019-03-06 DIAGNOSIS — N390 Urinary tract infection, site not specified: Secondary | ICD-10-CM | POA: Diagnosis not present

## 2019-03-06 DIAGNOSIS — Z1612 Extended spectrum beta lactamase (ESBL) resistance: Secondary | ICD-10-CM | POA: Diagnosis not present

## 2019-03-06 DIAGNOSIS — F0391 Unspecified dementia with behavioral disturbance: Secondary | ICD-10-CM | POA: Diagnosis not present

## 2019-03-06 DIAGNOSIS — F05 Delirium due to known physiological condition: Secondary | ICD-10-CM | POA: Diagnosis not present

## 2019-03-06 DIAGNOSIS — B372 Candidiasis of skin and nail: Secondary | ICD-10-CM | POA: Diagnosis not present

## 2019-03-07 DIAGNOSIS — M6281 Muscle weakness (generalized): Secondary | ICD-10-CM | POA: Diagnosis not present

## 2019-03-07 DIAGNOSIS — R262 Difficulty in walking, not elsewhere classified: Secondary | ICD-10-CM | POA: Diagnosis not present

## 2019-03-07 DIAGNOSIS — R21 Rash and other nonspecific skin eruption: Secondary | ICD-10-CM | POA: Diagnosis not present

## 2019-03-07 DIAGNOSIS — F0391 Unspecified dementia with behavioral disturbance: Secondary | ICD-10-CM | POA: Diagnosis not present

## 2019-03-07 DIAGNOSIS — L299 Pruritus, unspecified: Secondary | ICD-10-CM | POA: Diagnosis not present

## 2019-03-07 DIAGNOSIS — M25562 Pain in left knee: Secondary | ICD-10-CM | POA: Diagnosis not present

## 2019-03-07 DIAGNOSIS — R4189 Other symptoms and signs involving cognitive functions and awareness: Secondary | ICD-10-CM | POA: Diagnosis not present

## 2019-03-08 DIAGNOSIS — F419 Anxiety disorder, unspecified: Secondary | ICD-10-CM | POA: Diagnosis not present

## 2019-03-08 DIAGNOSIS — G4700 Insomnia, unspecified: Secondary | ICD-10-CM | POA: Diagnosis not present

## 2019-03-08 DIAGNOSIS — F0391 Unspecified dementia with behavioral disturbance: Secondary | ICD-10-CM | POA: Diagnosis not present

## 2019-03-08 DIAGNOSIS — R451 Restlessness and agitation: Secondary | ICD-10-CM | POA: Diagnosis not present

## 2019-03-08 DIAGNOSIS — W19XXXA Unspecified fall, initial encounter: Secondary | ICD-10-CM | POA: Diagnosis not present

## 2019-03-08 DIAGNOSIS — M6281 Muscle weakness (generalized): Secondary | ICD-10-CM | POA: Diagnosis not present

## 2019-03-09 DIAGNOSIS — R4189 Other symptoms and signs involving cognitive functions and awareness: Secondary | ICD-10-CM | POA: Diagnosis not present

## 2019-03-09 DIAGNOSIS — R296 Repeated falls: Secondary | ICD-10-CM | POA: Diagnosis not present

## 2019-03-09 DIAGNOSIS — M6281 Muscle weakness (generalized): Secondary | ICD-10-CM | POA: Diagnosis not present

## 2019-03-09 DIAGNOSIS — N390 Urinary tract infection, site not specified: Secondary | ICD-10-CM | POA: Diagnosis not present

## 2019-03-09 DIAGNOSIS — F0391 Unspecified dementia with behavioral disturbance: Secondary | ICD-10-CM | POA: Diagnosis not present

## 2019-03-09 DIAGNOSIS — R627 Adult failure to thrive: Secondary | ICD-10-CM | POA: Diagnosis not present

## 2019-03-09 DIAGNOSIS — R262 Difficulty in walking, not elsewhere classified: Secondary | ICD-10-CM | POA: Diagnosis not present

## 2019-03-09 DIAGNOSIS — M25562 Pain in left knee: Secondary | ICD-10-CM | POA: Diagnosis not present

## 2019-03-14 DIAGNOSIS — R262 Difficulty in walking, not elsewhere classified: Secondary | ICD-10-CM | POA: Diagnosis not present

## 2019-03-14 DIAGNOSIS — R4189 Other symptoms and signs involving cognitive functions and awareness: Secondary | ICD-10-CM | POA: Diagnosis not present

## 2019-03-14 DIAGNOSIS — M6281 Muscle weakness (generalized): Secondary | ICD-10-CM | POA: Diagnosis not present

## 2019-03-14 DIAGNOSIS — M25562 Pain in left knee: Secondary | ICD-10-CM | POA: Diagnosis not present

## 2019-03-16 ENCOUNTER — Encounter (HOSPITAL_COMMUNITY): Payer: Self-pay

## 2019-03-16 ENCOUNTER — Other Ambulatory Visit: Payer: Self-pay

## 2019-03-16 ENCOUNTER — Emergency Department (HOSPITAL_COMMUNITY): Payer: Medicare Other

## 2019-03-16 ENCOUNTER — Inpatient Hospital Stay (HOSPITAL_COMMUNITY)
Admission: EM | Admit: 2019-03-16 | Discharge: 2019-03-22 | DRG: 470 | Disposition: A | Discharge status: Skilled Nursing Facility | Payer: Medicare Other | Source: Skilled Nursing Facility | Attending: Internal Medicine | Admitting: Internal Medicine

## 2019-03-16 DIAGNOSIS — M25552 Pain in left hip: Secondary | ICD-10-CM | POA: Diagnosis not present

## 2019-03-16 DIAGNOSIS — S72001D Fracture of unspecified part of neck of right femur, subsequent encounter for closed fracture with routine healing: Secondary | ICD-10-CM | POA: Diagnosis not present

## 2019-03-16 DIAGNOSIS — Z419 Encounter for procedure for purposes other than remedying health state, unspecified: Secondary | ICD-10-CM

## 2019-03-16 DIAGNOSIS — Z9842 Cataract extraction status, left eye: Secondary | ICD-10-CM | POA: Diagnosis not present

## 2019-03-16 DIAGNOSIS — G20 Parkinson's disease: Secondary | ICD-10-CM | POA: Diagnosis not present

## 2019-03-16 DIAGNOSIS — F0390 Unspecified dementia without behavioral disturbance: Secondary | ICD-10-CM | POA: Diagnosis present

## 2019-03-16 DIAGNOSIS — E861 Hypovolemia: Secondary | ICD-10-CM | POA: Diagnosis not present

## 2019-03-16 DIAGNOSIS — Z961 Presence of intraocular lens: Secondary | ICD-10-CM | POA: Diagnosis present

## 2019-03-16 DIAGNOSIS — Z4789 Encounter for other orthopedic aftercare: Secondary | ICD-10-CM | POA: Diagnosis not present

## 2019-03-16 DIAGNOSIS — Z885 Allergy status to narcotic agent status: Secondary | ICD-10-CM

## 2019-03-16 DIAGNOSIS — F329 Major depressive disorder, single episode, unspecified: Secondary | ICD-10-CM | POA: Diagnosis present

## 2019-03-16 DIAGNOSIS — E785 Hyperlipidemia, unspecified: Secondary | ICD-10-CM | POA: Diagnosis present

## 2019-03-16 DIAGNOSIS — S72002A Fracture of unspecified part of neck of left femur, initial encounter for closed fracture: Secondary | ICD-10-CM

## 2019-03-16 DIAGNOSIS — G4733 Obstructive sleep apnea (adult) (pediatric): Secondary | ICD-10-CM | POA: Diagnosis present

## 2019-03-16 DIAGNOSIS — M1990 Unspecified osteoarthritis, unspecified site: Secondary | ICD-10-CM | POA: Diagnosis present

## 2019-03-16 DIAGNOSIS — Y92129 Unspecified place in nursing home as the place of occurrence of the external cause: Secondary | ICD-10-CM

## 2019-03-16 DIAGNOSIS — Z471 Aftercare following joint replacement surgery: Secondary | ICD-10-CM | POA: Diagnosis not present

## 2019-03-16 DIAGNOSIS — W19XXXA Unspecified fall, initial encounter: Secondary | ICD-10-CM | POA: Diagnosis not present

## 2019-03-16 DIAGNOSIS — Z9841 Cataract extraction status, right eye: Secondary | ICD-10-CM | POA: Diagnosis not present

## 2019-03-16 DIAGNOSIS — E860 Dehydration: Secondary | ICD-10-CM | POA: Diagnosis not present

## 2019-03-16 DIAGNOSIS — R451 Restlessness and agitation: Secondary | ICD-10-CM | POA: Diagnosis not present

## 2019-03-16 DIAGNOSIS — Z880 Allergy status to penicillin: Secondary | ICD-10-CM

## 2019-03-16 DIAGNOSIS — M62838 Other muscle spasm: Secondary | ICD-10-CM | POA: Diagnosis not present

## 2019-03-16 DIAGNOSIS — E871 Hypo-osmolality and hyponatremia: Secondary | ICD-10-CM | POA: Diagnosis present

## 2019-03-16 DIAGNOSIS — Z823 Family history of stroke: Secondary | ICD-10-CM | POA: Diagnosis not present

## 2019-03-16 DIAGNOSIS — S72002D Fracture of unspecified part of neck of left femur, subsequent encounter for closed fracture with routine healing: Secondary | ICD-10-CM | POA: Diagnosis not present

## 2019-03-16 DIAGNOSIS — S72032A Displaced midcervical fracture of left femur, initial encounter for closed fracture: Secondary | ICD-10-CM | POA: Diagnosis not present

## 2019-03-16 DIAGNOSIS — Z888 Allergy status to other drugs, medicaments and biological substances status: Secondary | ICD-10-CM

## 2019-03-16 DIAGNOSIS — Z03818 Encounter for observation for suspected exposure to other biological agents ruled out: Secondary | ICD-10-CM | POA: Diagnosis not present

## 2019-03-16 DIAGNOSIS — F411 Generalized anxiety disorder: Secondary | ICD-10-CM | POA: Diagnosis present

## 2019-03-16 DIAGNOSIS — R21 Rash and other nonspecific skin eruption: Secondary | ICD-10-CM | POA: Diagnosis not present

## 2019-03-16 DIAGNOSIS — G4730 Sleep apnea, unspecified: Secondary | ICD-10-CM | POA: Diagnosis not present

## 2019-03-16 DIAGNOSIS — M2550 Pain in unspecified joint: Secondary | ICD-10-CM | POA: Diagnosis not present

## 2019-03-16 DIAGNOSIS — K219 Gastro-esophageal reflux disease without esophagitis: Secondary | ICD-10-CM | POA: Diagnosis present

## 2019-03-16 DIAGNOSIS — M7522 Bicipital tendinitis, left shoulder: Secondary | ICD-10-CM | POA: Diagnosis not present

## 2019-03-16 DIAGNOSIS — W19XXXD Unspecified fall, subsequent encounter: Secondary | ICD-10-CM | POA: Diagnosis not present

## 2019-03-16 DIAGNOSIS — Z79891 Long term (current) use of opiate analgesic: Secondary | ICD-10-CM

## 2019-03-16 DIAGNOSIS — Z800 Family history of malignant neoplasm of digestive organs: Secondary | ICD-10-CM | POA: Diagnosis not present

## 2019-03-16 DIAGNOSIS — R52 Pain, unspecified: Secondary | ICD-10-CM | POA: Diagnosis not present

## 2019-03-16 DIAGNOSIS — F0280 Dementia in other diseases classified elsewhere without behavioral disturbance: Secondary | ICD-10-CM | POA: Diagnosis not present

## 2019-03-16 DIAGNOSIS — D649 Anemia, unspecified: Secondary | ICD-10-CM | POA: Diagnosis present

## 2019-03-16 DIAGNOSIS — F339 Major depressive disorder, recurrent, unspecified: Secondary | ICD-10-CM | POA: Diagnosis not present

## 2019-03-16 DIAGNOSIS — I700 Atherosclerosis of aorta: Secondary | ICD-10-CM | POA: Diagnosis not present

## 2019-03-16 DIAGNOSIS — Z8249 Family history of ischemic heart disease and other diseases of the circulatory system: Secondary | ICD-10-CM | POA: Diagnosis not present

## 2019-03-16 DIAGNOSIS — R2689 Other abnormalities of gait and mobility: Secondary | ICD-10-CM | POA: Diagnosis not present

## 2019-03-16 DIAGNOSIS — W07XXXA Fall from chair, initial encounter: Secondary | ICD-10-CM | POA: Diagnosis present

## 2019-03-16 DIAGNOSIS — Z79899 Other long term (current) drug therapy: Secondary | ICD-10-CM | POA: Diagnosis not present

## 2019-03-16 DIAGNOSIS — M6281 Muscle weakness (generalized): Secondary | ICD-10-CM | POA: Diagnosis not present

## 2019-03-16 DIAGNOSIS — Z7401 Bed confinement status: Secondary | ICD-10-CM | POA: Diagnosis not present

## 2019-03-16 DIAGNOSIS — Z96649 Presence of unspecified artificial hip joint: Secondary | ICD-10-CM

## 2019-03-16 DIAGNOSIS — K589 Irritable bowel syndrome without diarrhea: Secondary | ICD-10-CM | POA: Diagnosis not present

## 2019-03-16 DIAGNOSIS — J302 Other seasonal allergic rhinitis: Secondary | ICD-10-CM | POA: Diagnosis not present

## 2019-03-16 DIAGNOSIS — Z1159 Encounter for screening for other viral diseases: Secondary | ICD-10-CM | POA: Diagnosis not present

## 2019-03-16 DIAGNOSIS — R102 Pelvic and perineal pain: Secondary | ICD-10-CM | POA: Diagnosis not present

## 2019-03-16 DIAGNOSIS — Z96642 Presence of left artificial hip joint: Secondary | ICD-10-CM | POA: Diagnosis not present

## 2019-03-16 DIAGNOSIS — S61502D Unspecified open wound of left wrist, subsequent encounter: Secondary | ICD-10-CM | POA: Diagnosis not present

## 2019-03-16 DIAGNOSIS — R4182 Altered mental status, unspecified: Secondary | ICD-10-CM | POA: Diagnosis not present

## 2019-03-16 DIAGNOSIS — S7292XA Unspecified fracture of left femur, initial encounter for closed fracture: Secondary | ICD-10-CM | POA: Diagnosis not present

## 2019-03-16 LAB — CBC WITH DIFFERENTIAL/PLATELET
Abs Immature Granulocytes: 0.01 10*3/uL (ref 0.00–0.07)
Basophils Absolute: 0 10*3/uL (ref 0.0–0.1)
Basophils Relative: 1 %
Eosinophils Absolute: 0.3 10*3/uL (ref 0.0–0.5)
Eosinophils Relative: 6 %
HCT: 33.4 % — ABNORMAL LOW (ref 39.0–52.0)
Hemoglobin: 10.8 g/dL — ABNORMAL LOW (ref 13.0–17.0)
Immature Granulocytes: 0 %
Lymphocytes Relative: 13 %
Lymphs Abs: 0.7 10*3/uL (ref 0.7–4.0)
MCH: 29.5 pg (ref 26.0–34.0)
MCHC: 32.3 g/dL (ref 30.0–36.0)
MCV: 91.3 fL (ref 80.0–100.0)
Monocytes Absolute: 0.9 10*3/uL (ref 0.1–1.0)
Monocytes Relative: 16 %
Neutro Abs: 3.5 10*3/uL (ref 1.7–7.7)
Neutrophils Relative %: 64 %
Platelets: 428 10*3/uL — ABNORMAL HIGH (ref 150–400)
RBC: 3.66 MIL/uL — ABNORMAL LOW (ref 4.22–5.81)
RDW: 12.9 % (ref 11.5–15.5)
WBC: 5.5 10*3/uL (ref 4.0–10.5)
nRBC: 0 % (ref 0.0–0.2)

## 2019-03-16 LAB — BASIC METABOLIC PANEL
Anion gap: 7 (ref 5–15)
BUN: 28 mg/dL — ABNORMAL HIGH (ref 8–23)
CO2: 28 mmol/L (ref 22–32)
Calcium: 8.5 mg/dL — ABNORMAL LOW (ref 8.9–10.3)
Chloride: 96 mmol/L — ABNORMAL LOW (ref 98–111)
Creatinine, Ser: 0.91 mg/dL (ref 0.61–1.24)
GFR calc Af Amer: >60 mL/min (ref >60)
GFR calc non Af Amer: >60 mL/min (ref >60)
Glucose, Bld: 112 mg/dL — ABNORMAL HIGH (ref 70–99)
Potassium: 4.5 mmol/L (ref 3.5–5.1)
Sodium: 131 mmol/L — ABNORMAL LOW (ref 135–145)

## 2019-03-16 LAB — MRSA PCR SCREENING: MRSA by PCR: POSITIVE — AB

## 2019-03-16 LAB — PROTIME-INR
INR: 1 (ref 0.8–1.2)
Prothrombin Time: 13.2 seconds (ref 11.4–15.2)

## 2019-03-16 LAB — APTT: aPTT: 34 seconds (ref 24–36)

## 2019-03-16 LAB — SARS CORONAVIRUS 2 BY RT PCR (HOSPITAL ORDER, PERFORMED IN ~~LOC~~ HOSPITAL LAB): SARS Coronavirus 2: NEGATIVE

## 2019-03-16 MED ORDER — OXYCODONE-ACETAMINOPHEN 5-325 MG PO TABS
1.0000 | ORAL_TABLET | ORAL | Status: DC | PRN
  Administered 2019-03-16 – 2019-03-20 (×2): 1 via ORAL
  Filled 2019-03-16 (×2): qty 1

## 2019-03-16 MED ORDER — NYSTATIN 100000 UNIT/GM EX CREA
1.0000 "application " | TOPICAL_CREAM | Freq: Two times a day (BID) | CUTANEOUS | Status: DC
  Administered 2019-03-16 – 2019-03-22 (×11): 1 via TOPICAL
  Filled 2019-03-16: qty 15

## 2019-03-16 MED ORDER — SULFAMETHOXAZOLE-TRIMETHOPRIM 800-160 MG PO TABS
1.0000 | ORAL_TABLET | Freq: Two times a day (BID) | ORAL | Status: AC
  Administered 2019-03-16 (×2): 1 via ORAL
  Filled 2019-03-16 (×2): qty 1

## 2019-03-16 MED ORDER — TRANEXAMIC ACID 1000 MG/10ML IV SOLN: 2000.0000 mg | INTRAVENOUS | Status: DC

## 2019-03-16 MED ORDER — ACETAMINOPHEN 650 MG RE SUPP: 650.0000 mg | Freq: Four times a day (QID) | RECTAL | Status: DC | PRN

## 2019-03-16 MED ORDER — TRANEXAMIC ACID 1000 MG/10ML IV SOLN
2000.0000 mg | INTRAVENOUS | Status: DC
  Filled 2019-03-16 (×2): qty 20

## 2019-03-16 MED ORDER — TRANEXAMIC ACID-NACL 1000-0.7 MG/100ML-% IV SOLN
1000.0000 mg | INTRAVENOUS | Status: AC
  Administered 2019-03-17: 1000 mg via INTRAVENOUS
  Filled 2019-03-16: qty 100

## 2019-03-16 MED ORDER — SULFAMETHOXAZOLE-TRIMETHOPRIM 800-160 MG PO TABS: 1.0000 | ORAL_TABLET | Freq: Two times a day (BID) | ORAL | Status: DC

## 2019-03-16 MED ORDER — POLYETHYLENE GLYCOL 3350 17 G PO PACK
17.0000 g | PACK | Freq: Every day | ORAL | Status: DC | PRN
  Administered 2019-03-19: 17 g via ORAL
  Filled 2019-03-16: qty 1

## 2019-03-16 MED ORDER — MORPHINE SULFATE (PF) 2 MG/ML IV SOLN: 0.5000 mg | INTRAVENOUS | Status: DC | PRN

## 2019-03-16 MED ORDER — ENOXAPARIN SODIUM 40 MG/0.4ML ~~LOC~~ SOLN
40.0000 mg | SUBCUTANEOUS | Status: DC
  Administered 2019-03-16: 40 mg via SUBCUTANEOUS
  Filled 2019-03-16: qty 0.4

## 2019-03-16 MED ORDER — POVIDONE-IODINE 10 % EX SWAB: 2.0000 "application " | Freq: Once | CUTANEOUS | Status: DC

## 2019-03-16 MED ORDER — SODIUM CHLORIDE 0.9 % IV SOLN
INTRAVENOUS | Status: DC
  Administered 2019-03-16 – 2019-03-17 (×3): via INTRAVENOUS

## 2019-03-16 MED ORDER — ONDANSETRON HCL 4 MG/2ML IJ SOLN: 4.0000 mg | Freq: Four times a day (QID) | INTRAMUSCULAR | Status: DC | PRN

## 2019-03-16 MED ORDER — ENSURE PRE-SURGERY PO LIQD
296.0000 mL | Freq: Once | ORAL | Status: AC
  Administered 2019-03-16: 296 mL via ORAL
  Filled 2019-03-16: qty 296

## 2019-03-16 MED ORDER — ONDANSETRON HCL 4 MG PO TABS: 4.0000 mg | ORAL_TABLET | Freq: Four times a day (QID) | ORAL | Status: DC | PRN

## 2019-03-16 MED ORDER — ACETAMINOPHEN 325 MG PO TABS
650.0000 mg | ORAL_TABLET | Freq: Four times a day (QID) | ORAL | Status: DC | PRN
  Administered 2019-03-17 – 2019-03-22 (×4): 650 mg via ORAL
  Filled 2019-03-16 (×4): qty 2

## 2019-03-16 MED ORDER — ALBUTEROL SULFATE (2.5 MG/3ML) 0.083% IN NEBU: 2.5000 mg | INHALATION_SOLUTION | RESPIRATORY_TRACT | Status: DC | PRN

## 2019-03-16 MED ORDER — TRANEXAMIC ACID-NACL 1000-0.7 MG/100ML-% IV SOLN: 1000.0000 mg | INTRAVENOUS | Status: DC

## 2019-03-16 MED ORDER — DOCUSATE SODIUM 100 MG PO CAPS
100.0000 mg | ORAL_CAPSULE | Freq: Two times a day (BID) | ORAL | Status: DC
  Administered 2019-03-16 – 2019-03-22 (×12): 100 mg via ORAL
  Filled 2019-03-16 (×12): qty 1

## 2019-03-16 MED ORDER — HYDROCODONE-ACETAMINOPHEN 5-325 MG PO TABS
1.0000 | ORAL_TABLET | ORAL | Status: DC | PRN
  Administered 2019-03-16 – 2019-03-18 (×3): 1 via ORAL
  Filled 2019-03-16 (×3): qty 1

## 2019-03-16 MED ORDER — CEFAZOLIN SODIUM-DEXTROSE 2-4 GM/100ML-% IV SOLN
2.0000 g | INTRAVENOUS | Status: AC
  Administered 2019-03-17: 2 g via INTRAVENOUS
  Filled 2019-03-16 (×2): qty 100

## 2019-03-16 MED ORDER — HYDROCORTISONE 1 % EX CREA
1.0000 "application " | TOPICAL_CREAM | Freq: Two times a day (BID) | CUTANEOUS | Status: DC
  Administered 2019-03-16 – 2019-03-22 (×12): 1 via TOPICAL
  Filled 2019-03-16: qty 28

## 2019-03-16 MED ORDER — LORAZEPAM 0.5 MG PO TABS
0.5000 mg | ORAL_TABLET | Freq: Two times a day (BID) | ORAL | Status: DC | PRN
  Administered 2019-03-16 – 2019-03-21 (×5): 0.5 mg via ORAL
  Filled 2019-03-16 (×6): qty 1

## 2019-03-16 MED ORDER — METHOCARBAMOL 500 MG PO TABS
500.0000 mg | ORAL_TABLET | Freq: Three times a day (TID) | ORAL | Status: DC | PRN
  Administered 2019-03-16: 500 mg via ORAL
  Filled 2019-03-16: qty 1

## 2019-03-16 MED ORDER — TRAZODONE HCL 50 MG PO TABS
50.0000 mg | ORAL_TABLET | Freq: Every day | ORAL | Status: DC
  Administered 2019-03-16 – 2019-03-21 (×6): 50 mg via ORAL
  Filled 2019-03-16 (×6): qty 1

## 2019-03-16 MED ORDER — POVIDONE-IODINE 10 % EX SWAB
2.0000 "application " | Freq: Once | CUTANEOUS | Status: AC
  Administered 2019-03-17: 2 via TOPICAL

## 2019-03-16 NOTE — Consult Note (Signed)
ORTHOPAEDIC CONSULTATION  REQUESTING PHYSICIAN: Alma Friendly, MD  Chief Complaint: Left femoral neck fracture  HPI: [name] [name] is a 85 y.o. male who presents with left femoral neck fracture s/p mechanical fall at SNF on 03/08/19.  He complained of continued left hip pain and inability to walk.  The patient has severe dementia.  HPI is limited as a result.  He underwent right hip pinning 1 month ago and has done well until now.    Past Medical History:  Diagnosis Date  . Allergic rhinitis, cause unspecified   . Anxiety state, unspecified   . Arthritis   . Benign neoplasm of colon   . Depressive disorder, not elsewhere classified   . Esophageal reflux   . Hyperlipidemia   . Irritable bowel syndrome   . Obstructive sleep apnea (adult) (pediatric)   . Other chronic allergic conjunctivitis   . Ulcer 1951   stomach   Past Surgical History:  Procedure Laterality Date  . APPENDECTOMY    . CATARACT EXTRACTION W/ INTRAOCULAR LENS  IMPLANT, BILATERAL  2003  . COLONOSCOPY    . ESOPHAGOGASTRODUODENOSCOPY    . HIP PINNING,CANNULATED Right 02/20/2019   Procedure: CANNULATED HIP PINNING;  Surgeon: Leandrew Koyanagi, MD;  Location: [place];  Service: Orthopedics;  Laterality: Right;  . TONSILLECTOMY     Social History   Socioeconomic History  . Marital status: Married    Spouse name: Not on file  . Number of children: Not on file  . Years of education: Not on file  . Highest education level: Not on file  Occupational History  . Not on file  Social Needs  . Financial resource strain: Not on file  . Food insecurity    Worry: Not on file    Inability: Not on file  . Transportation needs    Medical: Not on file    Non-medical: Not on file  Tobacco Use  . Smoking status: Never Smoker  . Smokeless tobacco: Never Used  Substance and Sexual Activity  . Alcohol use: No  . Drug use: No  . Sexual activity: Not on file  Lifestyle  . Physical activity    Days per week: Not  on file    Minutes per session: Not on file  . Stress: Not on file  Relationships  . Social Herbalist on phone: Not on file    Gets together: Not on file    Attends religious service: Not on file    Active member of club or organization: Not on file    Attends meetings of clubs or organizations: Not on file    Relationship status: Not on file  Other Topics Concern  . Not on file  Social History Narrative  . Not on file   Family History  Problem Relation Age of Onset  . Colon cancer Father   . Stroke Mother   . Heart attack Mother   . Colon cancer Mother   . Rectal cancer Neg Hx   . Stomach cancer Neg Hx    Allergies  Allergen Reactions  . Codeine Other (See Comments)    Unknown rxn per MAR  . Penicillins Rash    Per MAR Did it involve swelling of the face/tongue/throat, SOB, or low BP? Unknown Did it involve sudden or severe rash/hives, skin peeling, or any reaction on the inside of your mouth or nose? Unknown Did you need to seek medical attention at a hospital or doctor's office? Unknown  When did it last happen? Unknown If all above answers are "NO", may proceed with cephalosporin use.  . Ranitidine Other (See Comments)    Unknown rxn per MAR  . Amoxicillin Rash    Per MAR Did it involve swelling of the face/tongue/throat, SOB, or low BP? Unknown Did it involve sudden or severe rash/hives, skin peeling, or any reaction on the inside of your mouth or nose? Unknown Did you need to seek medical attention at a hospital or doctor's office? Unknown When did it last happen? Unknown If all above answers are "NO", may proceed with cephalosporin use.   Prior to Admission medications   Medication Sig Start Date End Date Taking? Authorizing Provider  acetaminophen (TYLENOL) 325 MG tablet Take 1-2 tablets (325-650 mg total) by mouth every 6 (six) hours as needed for mild pain (pain score 1-3 or temp > 100.5). 02/24/19  Yes Dana Allan I, MD  docusate  sodium (COLACE) 100 MG capsule Take 1 capsule (100 mg total) by mouth 2 (two) times daily. 02/24/19  Yes Bonnell Public, MD  feeding supplement, ENSURE ENLIVE, (ENSURE ENLIVE) LIQD Take 237 mLs by mouth 2 (two) times daily between meals. 02/24/19  Yes Bonnell Public, MD  HYDROcodone-acetaminophen (NORCO/VICODIN) 5-325 MG tablet Take 1-2 tablets by mouth every 4 (four) hours as needed for moderate pain (pain score 4-6). Patient taking differently: Take 1 tablet by mouth See admin instructions. Take 1 tablet by mouth every 12 hours. May also take 1 tablet every 6 hours as needed for pain 02/24/19  Yes Dana Allan I, MD  hydrocortisone 2.5 % cream Apply 1 application topically 2 (two) times daily. Apply to rash on right arm   Yes [provider]  LORazepam (ATIVAN) 0.5 MG tablet Take 0.5 mg by mouth every 12 (twelve) hours as needed (agitation).   Yes [provider]  methocarbamol (ROBAXIN) 500 MG tablet Take 1 tablet (500 mg total) by mouth every 6 (six) hours as needed for muscle spasms. 02/24/19  Yes Bonnell Public, MD  Multiple Vitamin (MULTIVITAMIN WITH MINERALS) TABS tablet Take 1 tablet by mouth daily. 02/25/19  Yes Dana Allan I, MD  neomycin-bacitracin-polymyxin (NEOSPORIN) [phone] ointment Apply 1 application topically every evening. Apply to left wrist for skin tear   Yes [provider]  nystatin cream (MYCOSTATIN) Apply 1 application topically 2 (two) times daily. Apply to groin/penis for yeast   Yes [provider]  polyethylene glycol (MIRALAX / GLYCOLAX) 17 g packet Take 17 g by mouth daily as needed for mild constipation. 02/24/19  Yes Dana Allan I, MD  sulfamethoxazole-trimethoprim (BACTRIM DS) 800-160 MG tablet Take 1 tablet by mouth every 12 (twelve) hours.   Yes [provider]  traZODone (DESYREL) 50 MG tablet Take 50 mg by mouth at bedtime.   Yes [provider]   Dg Chest Port 1 View  Result  Date: 03/16/2019 CLINICAL DATA:  Preop hip fracture. EXAM: PORTABLE CHEST 1 VIEW COMPARISON:  Radiograph 02/19/2019 FINDINGS: The cardiomediastinal contours are normal. Aortic atherosclerosis. Pulmonary vasculature is normal. No consolidation, pleural effusion, or pneumothorax. Skin folds project over the left chest. No acute osseous abnormalities are seen. IMPRESSION: 1. No acute chest finding. 2.  Aortic Atherosclerosis (ICD10-I70.0). Electronically Signed   By: Keith Rake M.D.   On: 03/16/2019 03:58   Dg Hip Unilat W Or Wo Pelvis 2-3 Views Left  Result Date: 03/16/2019 CLINICAL DATA:  Pain EXAM: DG HIP (WITH OR WITHOUT PELVIS) 2-3V LEFT COMPARISON:  March 03, 2019 FINDINGS: Again noted is a transcervical fracture of the proximal left femur with interval increase in displacement. There is surrounding soft tissue swelling. There is no dislocation. The patient is status post prior percutaneous pinning of the right hip. IMPRESSION: Acute fracture of the proximal left femur with significant interval displacement when compared to prior study. Electronically Signed   By: Constance Holster M.D.   On: 03/16/2019 03:37    All pertinent xrays, MRI, CT independently reviewed and interpreted  Positive ROS: All other systems have been reviewed and were otherwise negative with the exception of those mentioned in the HPI and as above.  Physical Exam: General: Ano acute distress Cardiovascular: No pedal edema Respiratory: No cyanosis, no use of accessory musculature GI: No organomegaly, abdomen is soft and non-tender Skin: No lesions in the area of chief complaint Neurologic: Sensation intact distally Psychiatric: Patient has advanced dementia Lymphatic: No axillary or cervical lymphadenopathy  MUSCULOSKELETAL:  - severe pain with movement of the hip and extremity - skin intact - NVI distally - compartments soft  Assessment: Left femoral neck fracture  Plan: - partial hip replacement is  recommended, patient and family are aware of r/b/a and wish to proceed, informed consent obtained - medical optimization per primary team - surgery is planned for Friday  - NPO after midnight - hold lovenox  Thank you for the consult and the opportunity to see [name] [name]. Eduard Roux, MD 4:23 PM

## 2019-03-16 NOTE — ED Notes (Signed)
Patient pulling lines and covers off attempting to get out of bed. Patient stated he need to use restroom. Garbled speech. Assisted patient with urinal. Patient voided approximately 50 mLs. Patient repositioned and given warm blanket. Patient continues to talk but, incomprehensible. Per report this is patients baseline. Safety sitter orders have been placed. No sitter available at this time.

## 2019-03-16 NOTE — ED Notes (Addendum)
Attempted to call [facility] to give report. Placed on hold. Will attempt to call back.

## 2019-03-16 NOTE — Progress Notes (Signed)
I am aware of patient.  He has left displaced femoral neck fracture which will need partial hip replacement.  I will plan to perform this Friday afternoon pending medical optimization by primary team.  Appreciate transfer to [place].  I will see patient when [facility] arrives [facility] for full consult.

## 2019-03-16 NOTE — ED Notes (Addendum)
Report given to [facility], RN.   [facility] has been dispatched.

## 2019-03-16 NOTE — H&P (Signed)
History and Physical  [name] [name] SWF:[id number] DOB: Jun 28, 1931 DOA: 03/16/2019   Patient coming from: SNF- [facility]  Chief Complaint: Fall  HPI: [name] [name] is a 85 y.o. male with medical history significant for dementia, hyperlipidemia, GERD, depression, anxiety, recent right hip fracture status post surgery in mid June, presents after a fall from a chair on 03/08/2019.  History gotten from chart review as patient has dementia.  X-ray at the facility showed left hip fracture.  Patient has been complaining of ongoing left hip pain since the fall.  Brought in from the SNF.  ED Course: Patient's vitals remained stable, labs showed mild hyponatremia and normocytic anemia.  Patient is COVID negative.  Repeat x-ray of the hip showed acute fracture of the proximal left femur with significant interval displacement.  Dr. Erlinda Hong from orthopedics was consulted, plan for surgery on 03/17/2019  Review of Systems: Review of systems are otherwise negative   Past Medical History:  Diagnosis Date  . Allergic rhinitis, cause unspecified   . Anxiety state, unspecified   . Arthritis   . Benign neoplasm of colon   . Depressive disorder, not elsewhere classified   . Esophageal reflux   . Hyperlipidemia   . Irritable bowel syndrome   . Obstructive sleep apnea (adult) (pediatric)   . Other chronic allergic conjunctivitis   . Ulcer 1951   stomach   Past Surgical History:  Procedure Laterality Date  . APPENDECTOMY    . CATARACT EXTRACTION W/ INTRAOCULAR LENS  IMPLANT, BILATERAL  2003  . COLONOSCOPY    . ESOPHAGOGASTRODUODENOSCOPY    . HIP PINNING,CANNULATED Right 02/20/2019   Procedure: CANNULATED HIP PINNING;  Surgeon: Leandrew Koyanagi, MD;  Location: [place];  Service: Orthopedics;  Laterality: Right;  . TONSILLECTOMY      Social History:  reports that he has never smoked. He has never used smokeless tobacco. He reports that he does not drink alcohol or use drugs.    Allergies  Allergen Reactions  . Codeine Other (See Comments)    Unknown rxn per MAR  . Penicillins Rash    Per MAR Did it involve swelling of the face/tongue/throat, SOB, or low BP? Unknown Did it involve sudden or severe rash/hives, skin peeling, or any reaction on the inside of your mouth or nose? Unknown Did you need to seek medical attention at a hospital or doctor's office? Unknown When did it last happen? Unknown If all above answers are "NO", may proceed with cephalosporin use.  . Ranitidine Other (See Comments)    Unknown rxn per MAR  . Amoxicillin Rash    Per MAR Did it involve swelling of the face/tongue/throat, SOB, or low BP? Unknown Did it involve sudden or severe rash/hives, skin peeling, or any reaction on the inside of your mouth or nose? Unknown Did you need to seek medical attention at a hospital or doctor's office? Unknown When did it last happen? Unknown If all above answers are "NO", may proceed with cephalosporin use.    Family History  Problem Relation Age of Onset  . Colon cancer Father   . Stroke Mother   . Heart attack Mother   . Colon cancer Mother   . Rectal cancer Neg Hx   . Stomach cancer Neg Hx       Prior to Admission medications   Medication Sig Start Date End Date Taking? Authorizing Provider  acetaminophen (TYLENOL) 325 MG tablet Take 1-2 tablets (325-650 mg total) by mouth every 6 (  six) hours as needed for mild pain (pain score 1-3 or temp > 100.5). 02/24/19  Yes Dana Allan I, MD  docusate sodium (COLACE) 100 MG capsule Take 1 capsule (100 mg total) by mouth 2 (two) times daily. 02/24/19  Yes Bonnell Public, MD  feeding supplement, ENSURE ENLIVE, (ENSURE ENLIVE) LIQD Take 237 mLs by mouth 2 (two) times daily between meals. 02/24/19  Yes Bonnell Public, MD  HYDROcodone-acetaminophen (NORCO/VICODIN) 5-325 MG tablet Take 1-2 tablets by mouth every 4 (four) hours as needed for moderate pain (pain score 4-6). Patient  taking differently: Take 1 tablet by mouth See admin instructions. Take 1 tablet by mouth every 12 hours. May also take 1 tablet every 6 hours as needed for pain 02/24/19  Yes Dana Allan I, MD  hydrocortisone 2.5 % cream Apply 1 application topically 2 (two) times daily. Apply to rash on right arm   Yes [provider]  LORazepam (ATIVAN) 0.5 MG tablet Take 0.5 mg by mouth every 12 (twelve) hours as needed (agitation).   Yes [provider]  methocarbamol (ROBAXIN) 500 MG tablet Take 1 tablet (500 mg total) by mouth every 6 (six) hours as needed for muscle spasms. 02/24/19  Yes Bonnell Public, MD  Multiple Vitamin (MULTIVITAMIN WITH MINERALS) TABS tablet Take 1 tablet by mouth daily. 02/25/19  Yes Dana Allan I, MD  neomycin-bacitracin-polymyxin (NEOSPORIN) [phone] ointment Apply 1 application topically every evening. Apply to left wrist for skin tear   Yes [provider]  nystatin cream (MYCOSTATIN) Apply 1 application topically 2 (two) times daily. Apply to groin/penis for yeast   Yes [provider]  polyethylene glycol (MIRALAX / GLYCOLAX) 17 g packet Take 17 g by mouth daily as needed for mild constipation. 02/24/19  Yes Dana Allan I, MD  sulfamethoxazole-trimethoprim (BACTRIM DS) 800-160 MG tablet Take 1 tablet by mouth every 12 (twelve) hours.   Yes [provider]  traZODone (DESYREL) 50 MG tablet Take 50 mg by mouth at bedtime.   Yes [provider]    Physical Exam: BP 137/83 (BP Location: Left Arm)   Pulse 88   Temp 97.9 F (36.6 C) (Oral)   Resp 17   SpO2 100%   General: NAD, alert, awake, not oriented Eyes: Normal ENT: Normal Neck: Supple Cardiovascular: S1, S2 present Respiratory: CTAB Abdomen: Soft, nontender, nondistended, bowel sounds present Skin: Normal Musculoskeletal: No pedal edema bilaterally Psychiatric: Unable to assess Neurologic: Unable to follow commands/assess          Labs on  Admission:  Basic Metabolic Panel: Recent Labs  Lab 03/16/19 0415  NA 131*  K 4.5  CL 96*  CO2 28  GLUCOSE 112*  BUN 28*  CREATININE 0.91  CALCIUM 8.5*   Liver Function Tests: No results for input(s): AST, ALT, ALKPHOS, BILITOT, PROT, ALBUMIN in the last 168 hours. No results for input(s): LIPASE, AMYLASE in the last 168 hours. No results for input(s): AMMONIA in the last 168 hours. CBC: Recent Labs  Lab 03/16/19 0415  WBC 5.5  NEUTROABS 3.5  HGB 10.8*  HCT 33.4*  MCV 91.3  PLT 428*   Cardiac Enzymes: No results for input(s): CKTOTAL, CKMB, CKMBINDEX, TROPONINI in the last 168 hours.  BNP (last 3 results) No results for input(s): BNP in the last 8760 hours.  ProBNP (last 3 results) No results for input(s): PROBNP in the last 8760 hours.  CBG: No results for input(s): GLUCAP in the last 168 hours.  Radiological Exams on  Admission: Dg Chest Port 1 View  Result Date: 03/16/2019 CLINICAL DATA:  Preop hip fracture. EXAM: PORTABLE CHEST 1 VIEW COMPARISON:  Radiograph 02/19/2019 FINDINGS: The cardiomediastinal contours are normal. Aortic atherosclerosis. Pulmonary vasculature is normal. No consolidation, pleural effusion, or pneumothorax. Skin folds project over the left chest. No acute osseous abnormalities are seen. IMPRESSION: 1. No acute chest finding. 2.  Aortic Atherosclerosis (ICD10-I70.0). Electronically Signed   By: Keith Rake M.D.   On: 03/16/2019 03:58   Dg Hip Unilat W Or Wo Pelvis 2-3 Views Left  Result Date: 03/16/2019 CLINICAL DATA:  Pain EXAM: DG HIP (WITH OR WITHOUT PELVIS) 2-3V LEFT COMPARISON:  March 03, 2019 FINDINGS: Again noted is a transcervical fracture of the proximal left femur with interval increase in displacement. There is surrounding soft tissue swelling. There is no dislocation. The patient is status post prior percutaneous pinning of the right hip. IMPRESSION: Acute fracture of the proximal left femur with significant interval displacement  when compared to prior study. Electronically Signed   By: Constance Holster M.D.   On: 03/16/2019 03:37    EKG: Pending   Assessment/Plan Present on Admission: . Closed left hip fracture ([place]) . Fracture dislocation of left hip joint ([facility])  Principal Problem:   Closed left hip fracture ([facility]) Active Problems:   Fracture dislocation of left hip joint ([facility])  Acute fracture of proximal left femur X-ray of left hip showed acute fracture of the proximal left femur with significant interval displacement Pain management/DVT ppx as per Ortho (Dr Erlinda Hong consulted) Plan for surgery on 03/17/2019  Mild hyponatremia Appears dry Start IV fluids Daily BMP  Normocytic anemia Hemoglobin around baseline Anemia panel pending Daily CBC     DVT prophylaxis: Lovenox  Code Status: Full  Family Communication: None at bedside  Disposition Plan: To be determined, likely back to SNF  Consults called: Orthopedics  Admission status: Inpatient    Alma Friendly MD [facility]  If 7PM-7AM, please contact night-coverage [email or web address]  03/16/2019, 8:17 AM

## 2019-03-16 NOTE — ED Notes (Signed)
[facility] at bedside 

## 2019-03-16 NOTE — Progress Notes (Addendum)
0930 Received pt from [employer] via [facility]. Pt is confused, disoriented, impulsive. 1:1 safety sitter provided. Message left for Mathis Bud, pt's first contact person.  [facility] called back, updated about the pt and the scheduled surgery. Telephone consent for surgery obtained.

## 2019-03-16 NOTE — ED Provider Notes (Signed)
[facility] DEPT Provider Note   CSN: [id number] Arrival date & time: 03/16/19  0243    History   Chief Complaint Chief Complaint  Patient presents with  . Fall  . Left Hip Pain     LEVEL 5 CAVEAT 2/2 DEMENTIA  HPI [name] [name] is a 85 y.o. male.     85 year old male with a history of dyslipidemia, irritable bowel syndrome, esophageal reflux, dementia presents to the emergency department from [facility] and rehab.  He allegedly fell from a chair on 03/08/2019.  Note states that facility x-rayed the left hip which showed a fracture.  He has been complaining of ongoing left hip pain which has increased since the fall.  History of surgery on the right hip in mid June following a fall and closed R hip fx.  The history is provided by the patient. No language interpreter was used.  Fall    Past Medical History:  Diagnosis Date  . Allergic rhinitis, cause unspecified   . Anxiety state, unspecified   . Arthritis   . Benign neoplasm of colon   . Depressive disorder, not elsewhere classified   . Esophageal reflux   . Hyperlipidemia   . Irritable bowel syndrome   . Obstructive sleep apnea (adult) (pediatric)   . Other chronic allergic conjunctivitis   . Ulcer 1951   stomach    Patient Active Problem List   Diagnosis Date Noted  . Closed displaced fracture of right femoral neck ([place]) 02/19/2019  . Hip fracture ([place]) 02/19/2019  . Closed right hip fracture ([place]) 02/19/2019  . Evaluation by psychiatric service required   . Hypoxia 02/13/2018  . Edema 02/13/2018  . Hypoxemia 02/13/2018  . Dementia ([place]) 02/13/2018  . Altered mental status 02/13/2018  . Dermatitis 10/08/2015  . CHEST PAIN, ATYPICAL 04/11/2010  . Personal history of colon polyps and family history of colon cancer 12/19/2007  . Anxiety state 12/19/2007  . DEPRESSION 12/19/2007  . Bicipital tendinitis, left shoulder 12/19/2007  . ALLERGIC CONJUNCTIVITIS 12/19/2007   . Seasonal allergic rhinitis 12/19/2007  . GERD 12/19/2007  . IBS 12/19/2007    Past Surgical History:  Procedure Laterality Date  . APPENDECTOMY    . CATARACT EXTRACTION W/ INTRAOCULAR LENS  IMPLANT, BILATERAL  2003  . COLONOSCOPY    . ESOPHAGOGASTRODUODENOSCOPY    . HIP PINNING,CANNULATED Right 02/20/2019   Procedure: CANNULATED HIP PINNING;  Surgeon: Leandrew Koyanagi, MD;  Location: [facility];  Service: Orthopedics;  Laterality: Right;  . TONSILLECTOMY          Home Medications    Prior to Admission medications   Medication Sig Start Date End Date Taking? Authorizing Provider  acetaminophen (TYLENOL) 325 MG tablet Take 1-2 tablets (325-650 mg total) by mouth every 6 (six) hours as needed for mild pain (pain score 1-3 or temp > 100.5). 02/24/19  Yes Dana Allan I, MD  docusate sodium (COLACE) 100 MG capsule Take 1 capsule (100 mg total) by mouth 2 (two) times daily. 02/24/19  Yes Bonnell Public, MD  feeding supplement, ENSURE ENLIVE, (ENSURE ENLIVE) LIQD Take 237 mLs by mouth 2 (two) times daily between meals. 02/24/19  Yes Bonnell Public, MD  HYDROcodone-acetaminophen (NORCO/VICODIN) 5-325 MG tablet Take 1-2 tablets by mouth every 4 (four) hours as needed for moderate pain (pain score 4-6). Patient taking differently: Take 1 tablet by mouth See admin instructions. Take 1 tablet by mouth every 12 hours. May also take 1 tablet every 6 hours  as needed for pain 02/24/19  Yes Dana Allan I, MD  hydrocortisone 2.5 % cream Apply 1 application topically 2 (two) times daily. Apply to rash on right arm   Yes [provider]  LORazepam (ATIVAN) 0.5 MG tablet Take 0.5 mg by mouth every 12 (twelve) hours as needed (agitation).   Yes [provider]  methocarbamol (ROBAXIN) 500 MG tablet Take 1 tablet (500 mg total) by mouth every 6 (six) hours as needed for muscle spasms. 02/24/19  Yes Bonnell Public, MD  Multiple Vitamin (MULTIVITAMIN WITH MINERALS) TABS tablet  Take 1 tablet by mouth daily. 02/25/19  Yes Dana Allan I, MD  neomycin-bacitracin-polymyxin (NEOSPORIN) [phone] ointment Apply 1 application topically every evening. Apply to left wrist for skin tear   Yes [provider]  nystatin cream (MYCOSTATIN) Apply 1 application topically 2 (two) times daily. Apply to groin/penis for yeast   Yes [provider]  polyethylene glycol (MIRALAX / GLYCOLAX) 17 g packet Take 17 g by mouth daily as needed for mild constipation. 02/24/19  Yes Dana Allan I, MD  sulfamethoxazole-trimethoprim (BACTRIM DS) 800-160 MG tablet Take 1 tablet by mouth every 12 (twelve) hours.   Yes [provider]  traZODone (DESYREL) 50 MG tablet Take 50 mg by mouth at bedtime.   Yes [provider]    Family History Family History  Problem Relation Age of Onset  . Colon cancer Father   . Stroke Mother   . Heart attack Mother   . Colon cancer Mother   . Rectal cancer Neg Hx   . Stomach cancer Neg Hx     Social History Social History   Tobacco Use  . Smoking status: Never Smoker  . Smokeless tobacco: Never Used  Substance Use Topics  . Alcohol use: No  . Drug use: No     Allergies   Codeine, Penicillins, Ranitidine, and Amoxicillin   Review of Systems Review of Systems  Unable to perform ROS: Dementia     Physical Exam Updated Vital Signs BP 139/80 (BP Location: Left Arm)   Pulse 80   Temp 97.9 F (36.6 C) (Oral)   Resp 16   SpO2 97%   Physical Exam Vitals signs and nursing note reviewed.  Constitutional:      General: He is not in acute distress.    Appearance: He is well-developed. He is not diaphoretic.     Comments: Nontoxic appearing. Calm.  HENT:     Head: Normocephalic and atraumatic.  Eyes:     General: No scleral icterus.    Conjunctiva/sclera: Conjunctivae normal.  Neck:     Musculoskeletal: Normal range of motion.  Cardiovascular:     Rate and Rhythm: Normal rate and regular rhythm.      Pulses: Normal pulses.     Comments: DP pulse intact in the LLE Pulmonary:     Effort: Pulmonary effort is normal. No respiratory distress.     Comments: Respirations even and unlabored. Musculoskeletal: Normal range of motion.     Comments: Bilateral hips in flexed position. Question swelling to the lateral and posterior left hip. No crepitus.  Skin:    General: Skin is warm and dry.     Coloration: Skin is not pale.     Findings: No erythema or rash.  Neurological:     Mental Status: He is alert.     Comments: Alert.  Psychiatric:        Behavior: Behavior normal.      ED  Treatments / Results  Labs (all labs ordered are listed, but only abnormal results are displayed) Labs Reviewed  CBC WITH DIFFERENTIAL/PLATELET - Abnormal; Notable for the following components:      Result Value   RBC 3.66 (*)    Hemoglobin 10.8 (*)    HCT 33.4 (*)    Platelets 428 (*)    All other components within normal limits  BASIC METABOLIC PANEL - Abnormal; Notable for the following components:   Sodium 131 (*)    Chloride 96 (*)    Glucose, Bld 112 (*)    BUN 28 (*)    Calcium 8.5 (*)    All other components within normal limits  SARS CORONAVIRUS 2 (HOSPITAL ORDER, [facility])    EKG None   Radiology Dg Chest Port 1 View  Result Date: 03/16/2019 CLINICAL DATA:  Preop hip fracture. EXAM: PORTABLE CHEST 1 VIEW COMPARISON:  Radiograph 02/19/2019 FINDINGS: The cardiomediastinal contours are normal. Aortic atherosclerosis. Pulmonary vasculature is normal. No consolidation, pleural effusion, or pneumothorax. Skin folds project over the left chest. No acute osseous abnormalities are seen. IMPRESSION: 1. No acute chest finding. 2.  Aortic Atherosclerosis (ICD10-I70.0). Electronically Signed   By: Keith Rake M.D.   On: 03/16/2019 03:58   Dg Hip Unilat W Or Wo Pelvis 2-3 Views Left  Result Date: 03/16/2019 CLINICAL DATA:  Pain EXAM: DG HIP (WITH OR WITHOUT  PELVIS) 2-3V LEFT COMPARISON:  March 03, 2019 FINDINGS: Again noted is a transcervical fracture of the proximal left femur with interval increase in displacement. There is surrounding soft tissue swelling. There is no dislocation. The patient is status post prior percutaneous pinning of the right hip. IMPRESSION: Acute fracture of the proximal left femur with significant interval displacement when compared to prior study. Electronically Signed   By: Constance Holster M.D.   On: 03/16/2019 03:37    Procedures Procedures (including critical care time)  Medications Ordered in ED Medications  oxyCODONE-acetaminophen (PERCOCET/ROXICET) 5-325 MG per tablet 1 tablet (has no administration in time range)  methocarbamol (ROBAXIN) tablet 500 mg (has no administration in time range)  morphine 2 MG/ML injection 0.5 mg (has no administration in time range)     4:06 AM Spoke with Dr. Nyoka Cowden of radiology regarding the patient's x-ray.  States that patient did appear to have a fracture on 03/03/19 with minimal to no displacement.  Displacement is much more obvious now and may have been perpetuated by his recent fall on 03/08/19 reported by his facility.  Consult placed orthopedics regarding admission.  4:30 AM Dr. Erlinda Hong updated on patient's fracture and need for admission.  He requests that he be admitted to [facility].  He will see the patient later this morning.   5:45 AM Case discussed with Dr. Blaine Hamper who will admit.   Initial Impression / Assessment and Plan / ED Course  I have reviewed the triage vital signs and the nursing notes.  Pertinent labs & imaging results that were available during my care of the patient were reviewed by me and considered in my medical decision making (see chart for details).        85 year old male presenting for evaluation of persistent left hip pain.  Found to have left hip fracture.  Will require admission for operative repair.  Patient to be seen later this morning  by Dr. Erlinda Hong of Orthopedics.  [facility] to admit.   Final Clinical Impressions(s) / ED Diagnoses   Final diagnoses:  Closed  fracture of left hip, initial encounter [facility])    ED Discharge Orders    None       Antonietta Breach, PA-C 03/16/19 [place], MD 03/19/19 [phone]

## 2019-03-16 NOTE — ED Notes (Signed)
Patient found trying to get out of bed. Patient repositioned back in bed by this RN and Herbie Baltimore, [employer] Sitter orders are in. No sitter available at this time. Patient is becoming indirectable and not following directions.

## 2019-03-16 NOTE — ED Triage Notes (Signed)
Per EMS, patient coming from [facility] with complaints of left hip pain after a fall from a chair on July 1st. Facility xrayed hip which showed a fracture. Patient complaints of pain have increased since fall. Patient has a history of dementia and is  A&O x1 at baseline.

## 2019-03-16 NOTE — ED Notes (Signed)
Patient transported to radiology

## 2019-03-16 NOTE — Care Management (Signed)
This is a no charge note  Pending admission per PA, Claiborne Billings  85 year old man with past medical history of dementia, hyperlipidemia, GERD, stomach cancer, depression, anxiety, recent right hip fracture fixed with surgery, who presents with fall and left hip fracture.  Hemodynamically stable.  COVID-19 negative.  Orthopedic surgeon, Dr. Erlinda Hong was consulted.  Patient is admitted to [facility] bed as inpatient.  Please call manager of [facility] at [phone] when pt arrives to floor   Ivor Costa, MD  [facility]   If 7PM-7AM, please contact night-coverage [email or web address] Password [email or web address] 03/16/2019, 5:56 AM

## 2019-03-17 ENCOUNTER — Inpatient Hospital Stay (HOSPITAL_COMMUNITY): Payer: Medicare Other

## 2019-03-17 ENCOUNTER — Encounter (HOSPITAL_COMMUNITY): Payer: Self-pay | Admitting: Certified Registered"

## 2019-03-17 ENCOUNTER — Inpatient Hospital Stay (HOSPITAL_COMMUNITY): Payer: Medicare Other | Admitting: Certified Registered"

## 2019-03-17 ENCOUNTER — Encounter (HOSPITAL_COMMUNITY)
Admission: EM | Disposition: A | Discharge status: Skilled Nursing Facility | Payer: Self-pay | Source: Skilled Nursing Facility | Attending: Internal Medicine

## 2019-03-17 DIAGNOSIS — F0390 Unspecified dementia without behavioral disturbance: Secondary | ICD-10-CM

## 2019-03-17 HISTORY — PX: ANTERIOR APPROACH HEMI HIP ARTHROPLASTY: SHX6690

## 2019-03-17 LAB — BASIC METABOLIC PANEL
Anion gap: 8 (ref 5–15)
BUN: 22 mg/dL (ref 8–23)
CO2: 26 mmol/L (ref 22–32)
Calcium: 8.4 mg/dL — ABNORMAL LOW (ref 8.9–10.3)
Chloride: 99 mmol/L (ref 98–111)
Creatinine, Ser: 0.81 mg/dL (ref 0.61–1.24)
GFR calc Af Amer: >60 mL/min (ref >60)
GFR calc non Af Amer: >60 mL/min (ref >60)
Glucose, Bld: 97 mg/dL (ref 70–99)
Potassium: 4.6 mmol/L (ref 3.5–5.1)
Sodium: 133 mmol/L — ABNORMAL LOW (ref 135–145)

## 2019-03-17 LAB — VITAMIN B12: Vitamin B-12: 284 pg/mL (ref 180–914)

## 2019-03-17 LAB — CBC
HCT: 30 % — ABNORMAL LOW (ref 39.0–52.0)
Hemoglobin: 9.9 g/dL — ABNORMAL LOW (ref 13.0–17.0)
MCH: 29 pg (ref 26.0–34.0)
MCHC: 33 g/dL (ref 30.0–36.0)
MCV: 88 fL (ref 80.0–100.0)
Platelets: 432 10*3/uL — ABNORMAL HIGH (ref 150–400)
RBC: 3.41 MIL/uL — ABNORMAL LOW (ref 4.22–5.81)
RDW: 12.7 % (ref 11.5–15.5)
WBC: 4.3 10*3/uL (ref 4.0–10.5)
nRBC: 0 % (ref 0.0–0.2)

## 2019-03-17 LAB — IRON AND TIBC
Iron: 35 ug/dL — ABNORMAL LOW (ref 45–182)
Saturation Ratios: 17 % — ABNORMAL LOW (ref 17.9–39.5)
TIBC: 209 ug/dL — ABNORMAL LOW (ref 250–450)
UIBC: 174 ug/dL

## 2019-03-17 LAB — FOLATE: Folate: 12.9 ng/mL (ref >5.9)

## 2019-03-17 LAB — FERRITIN: Ferritin: 273 ng/mL (ref 24–336)

## 2019-03-17 SURGERY — HEMIARTHROPLASTY, HIP, DIRECT ANTERIOR APPROACH, FOR FRACTURE
Anesthesia: General | Site: Hip | Laterality: Left

## 2019-03-17 MED ORDER — CHLORHEXIDINE GLUCONATE CLOTH 2 % EX PADS
6.0000 | MEDICATED_PAD | Freq: Every day | CUTANEOUS | Status: AC
  Administered 2019-03-17 – 2019-03-21 (×5): 6 via TOPICAL

## 2019-03-17 MED ORDER — ONDANSETRON HCL 4 MG PO TABS: 4.0000 mg | ORAL_TABLET | Freq: Four times a day (QID) | ORAL | Status: DC | PRN

## 2019-03-17 MED ORDER — ROCURONIUM BROMIDE 10 MG/ML (PF) SYRINGE
PREFILLED_SYRINGE | INTRAVENOUS | Status: DC | PRN
  Administered 2019-03-17: 50 mg via INTRAVENOUS
  Administered 2019-03-17 (×2): 20 mg via INTRAVENOUS

## 2019-03-17 MED ORDER — 0.9 % SODIUM CHLORIDE (POUR BTL) OPTIME
TOPICAL | Status: DC | PRN
  Administered 2019-03-17: 1000 mL

## 2019-03-17 MED ORDER — PROPOFOL 10 MG/ML IV BOLUS
INTRAVENOUS | Status: AC
  Filled 2019-03-17: qty 20

## 2019-03-17 MED ORDER — HYDROMORPHONE HCL 1 MG/ML IJ SOLN
0.2500 mg | INTRAMUSCULAR | Status: DC | PRN
  Administered 2019-03-17 (×2): 0.25 mg via INTRAVENOUS
  Administered 2019-03-17: 0.5 mg via INTRAVENOUS

## 2019-03-17 MED ORDER — HYDROCODONE-ACETAMINOPHEN 5-325 MG PO TABS: 1.0000 | ORAL_TABLET | ORAL | Status: DC | PRN

## 2019-03-17 MED ORDER — PHENOL 1.4 % MT LIQD: 1.0000 | OROMUCOSAL | Status: DC | PRN

## 2019-03-17 MED ORDER — VANCOMYCIN HCL 1000 MG IV SOLR
INTRAVENOUS | Status: AC
  Filled 2019-03-17: qty 1000

## 2019-03-17 MED ORDER — DEXAMETHASONE SODIUM PHOSPHATE 10 MG/ML IJ SOLN
INTRAMUSCULAR | Status: DC | PRN
  Administered 2019-03-17: 5 mg via INTRAVENOUS

## 2019-03-17 MED ORDER — ENOXAPARIN SODIUM 40 MG/0.4ML ~~LOC~~ SOLN: 40.0000 mg | Freq: Every day | SUBCUTANEOUS | 13 refills | Status: AC

## 2019-03-17 MED ORDER — ACETAMINOPHEN 500 MG PO TABS: 500.0000 mg | ORAL_TABLET | Freq: Four times a day (QID) | ORAL | Status: DC

## 2019-03-17 MED ORDER — ONDANSETRON HCL 4 MG/2ML IJ SOLN: 4.0000 mg | Freq: Four times a day (QID) | INTRAMUSCULAR | Status: DC | PRN

## 2019-03-17 MED ORDER — OXYCODONE-ACETAMINOPHEN 5-325 MG PO TABS: 1.0000 | ORAL_TABLET | Freq: Three times a day (TID) | ORAL | 0 refills | Status: DC | PRN

## 2019-03-17 MED ORDER — MUPIROCIN 2 % EX OINT
1.0000 "application " | TOPICAL_OINTMENT | Freq: Two times a day (BID) | CUTANEOUS | Status: AC
  Administered 2019-03-17 – 2019-03-21 (×9): 1 via NASAL
  Filled 2019-03-17 (×2): qty 22

## 2019-03-17 MED ORDER — MORPHINE SULFATE (PF) 2 MG/ML IV SOLN: 1.0000 mg | INTRAVENOUS | Status: DC | PRN

## 2019-03-17 MED ORDER — PROMETHAZINE HCL 25 MG/ML IJ SOLN: 6.2500 mg | INTRAMUSCULAR | Status: DC | PRN

## 2019-03-17 MED ORDER — OXYCODONE HCL 5 MG PO TABS: 5.0000 mg | ORAL_TABLET | Freq: Once | ORAL | Status: DC | PRN

## 2019-03-17 MED ORDER — LACTATED RINGERS IV SOLN: INTRAVENOUS | Status: DC

## 2019-03-17 MED ORDER — VANCOMYCIN HCL 1 G IV SOLR
INTRAVENOUS | Status: DC | PRN
  Administered 2019-03-17: 1000 mg via TOPICAL

## 2019-03-17 MED ORDER — FENTANYL CITRATE (PF) 250 MCG/5ML IJ SOLN
INTRAMUSCULAR | Status: AC
  Filled 2019-03-17: qty 5

## 2019-03-17 MED ORDER — TRANEXAMIC ACID 1000 MG/10ML IV SOLN
INTRAVENOUS | Status: DC | PRN
  Administered 2019-03-17: 13:00:00 2000 mg via TOPICAL

## 2019-03-17 MED ORDER — ONDANSETRON HCL 4 MG/2ML IJ SOLN
INTRAMUSCULAR | Status: DC | PRN
  Administered 2019-03-17: 4 mg via INTRAVENOUS

## 2019-03-17 MED ORDER — FENTANYL CITRATE (PF) 100 MCG/2ML IJ SOLN
INTRAMUSCULAR | Status: DC | PRN
  Administered 2019-03-17: 100 ug via INTRAVENOUS
  Administered 2019-03-17: 50 ug via INTRAVENOUS

## 2019-03-17 MED ORDER — PHENYLEPHRINE 40 MCG/ML (10ML) SYRINGE FOR IV PUSH (FOR BLOOD PRESSURE SUPPORT)
PREFILLED_SYRINGE | INTRAVENOUS | Status: DC | PRN
  Administered 2019-03-17: 80 ug via INTRAVENOUS
  Administered 2019-03-17: 120 ug via INTRAVENOUS

## 2019-03-17 MED ORDER — ONDANSETRON HCL 4 MG/2ML IJ SOLN
INTRAMUSCULAR | Status: AC
  Filled 2019-03-17: qty 2

## 2019-03-17 MED ORDER — ENOXAPARIN SODIUM 40 MG/0.4ML ~~LOC~~ SOLN
40.0000 mg | SUBCUTANEOUS | Status: DC
  Administered 2019-03-18 – 2019-03-22 (×5): 40 mg via SUBCUTANEOUS
  Filled 2019-03-17 (×5): qty 0.4

## 2019-03-17 MED ORDER — PROPOFOL 10 MG/ML IV BOLUS
INTRAVENOUS | Status: DC | PRN
  Administered 2019-03-17: 115 mg via INTRAVENOUS

## 2019-03-17 MED ORDER — MAGNESIUM CITRATE PO SOLN
1.0000 | Freq: Once | ORAL | Status: AC | PRN
  Administered 2019-03-19: 1 via ORAL

## 2019-03-17 MED ORDER — SODIUM CHLORIDE 0.9 % IR SOLN
Status: DC | PRN
  Administered 2019-03-17: 3000 mL

## 2019-03-17 MED ORDER — OXYCODONE HCL 5 MG/5ML PO SOLN: 5.0000 mg | Freq: Once | ORAL | Status: DC | PRN

## 2019-03-17 MED ORDER — LIDOCAINE 2% (20 MG/ML) 5 ML SYRINGE
INTRAMUSCULAR | Status: DC | PRN
  Administered 2019-03-17: 60 mg via INTRAVENOUS

## 2019-03-17 MED ORDER — ROCURONIUM BROMIDE 10 MG/ML (PF) SYRINGE
PREFILLED_SYRINGE | INTRAVENOUS | Status: AC
  Filled 2019-03-17: qty 10

## 2019-03-17 MED ORDER — METHOCARBAMOL 1000 MG/10ML IJ SOLN
500.0000 mg | Freq: Four times a day (QID) | INTRAVENOUS | Status: DC | PRN
  Filled 2019-03-17: qty 5

## 2019-03-17 MED ORDER — TRANEXAMIC ACID-NACL 1000-0.7 MG/100ML-% IV SOLN
INTRAVENOUS | Status: AC
  Filled 2019-03-17: qty 200

## 2019-03-17 MED ORDER — ACETAMINOPHEN 325 MG PO TABS: 325.0000 mg | ORAL_TABLET | Freq: Four times a day (QID) | ORAL | Status: DC | PRN

## 2019-03-17 MED ORDER — DEXAMETHASONE SODIUM PHOSPHATE 10 MG/ML IJ SOLN
INTRAMUSCULAR | Status: AC
  Filled 2019-03-17: qty 1

## 2019-03-17 MED ORDER — MIDAZOLAM HCL 2 MG/2ML IJ SOLN
INTRAMUSCULAR | Status: AC
  Filled 2019-03-17: qty 2

## 2019-03-17 MED ORDER — CEFAZOLIN SODIUM-DEXTROSE 2-4 GM/100ML-% IV SOLN
2.0000 g | Freq: Four times a day (QID) | INTRAVENOUS | Status: AC
  Administered 2019-03-17 – 2019-03-18 (×3): 2 g via INTRAVENOUS
  Filled 2019-03-17 (×3): qty 100

## 2019-03-17 MED ORDER — POLYETHYLENE GLYCOL 3350 17 G PO PACK: 17.0000 g | PACK | Freq: Every day | ORAL | Status: DC | PRN

## 2019-03-17 MED ORDER — ALUM & MAG HYDROXIDE-SIMETH 200-200-20 MG/5ML PO SUSP: 30.0000 mL | ORAL | Status: DC | PRN

## 2019-03-17 MED ORDER — SODIUM CHLORIDE 0.9 % IV SOLN
INTRAVENOUS | Status: DC
  Administered 2019-03-18: 09:00:00 via INTRAVENOUS

## 2019-03-17 MED ORDER — HYDROCODONE-ACETAMINOPHEN 7.5-325 MG PO TABS
1.0000 | ORAL_TABLET | ORAL | Status: DC | PRN
  Administered 2019-03-18 – 2019-03-21 (×6): 1 via ORAL
  Filled 2019-03-17 (×6): qty 1

## 2019-03-17 MED ORDER — HYDROMORPHONE HCL 1 MG/ML IJ SOLN
INTRAMUSCULAR | Status: AC
  Filled 2019-03-17: qty 1

## 2019-03-17 MED ORDER — DOCUSATE SODIUM 100 MG PO CAPS: 100.0000 mg | ORAL_CAPSULE | Freq: Two times a day (BID) | ORAL | Status: DC

## 2019-03-17 MED ORDER — METHOCARBAMOL 500 MG PO TABS
500.0000 mg | ORAL_TABLET | Freq: Four times a day (QID) | ORAL | Status: DC | PRN
  Administered 2019-03-18 – 2019-03-20 (×4): 500 mg via ORAL
  Filled 2019-03-17 (×4): qty 1

## 2019-03-17 MED ORDER — LIDOCAINE 2% (20 MG/ML) 5 ML SYRINGE
INTRAMUSCULAR | Status: AC
  Filled 2019-03-17: qty 5

## 2019-03-17 MED ORDER — MENTHOL 3 MG MT LOZG: 1.0000 | LOZENGE | OROMUCOSAL | Status: DC | PRN

## 2019-03-17 MED ORDER — SORBITOL 70 % SOLN: 30.0000 mL | Freq: Every day | Status: DC | PRN

## 2019-03-17 SURGICAL SUPPLY — 58 items
BAG DECANTER FOR FLEXI CONT (MISCELLANEOUS) ×3 IMPLANT
BIPOLAR PROS AML 57 (Hips) ×2 IMPLANT
BIPOLAR PROS AML 57MM (Hips) ×1 IMPLANT
CELLS DAT CNTRL 66122 CELL SVR (MISCELLANEOUS) IMPLANT
COVER PERINEAL POST (MISCELLANEOUS) ×3 IMPLANT
COVER SURGICAL LIGHT HANDLE (MISCELLANEOUS) ×3 IMPLANT
COVER WAND RF STERILE (DRAPES) ×3 IMPLANT
DRAPE C-ARM 42X72 X-RAY (DRAPES) ×3 IMPLANT
DRAPE POUCH INSTRU U-SHP 10X18 (DRAPES) ×3 IMPLANT
DRAPE STERI IOBAN 125X83 (DRAPES) ×3 IMPLANT
DRAPE U-SHAPE 47X51 STRL (DRAPES) ×6 IMPLANT
DRSG AQUACEL AG ADV 3.5X10 (GAUZE/BANDAGES/DRESSINGS) ×3 IMPLANT
DURAPREP 26ML APPLICATOR (WOUND CARE) ×6 IMPLANT
ELECT BLADE 4.0 EZ CLEAN MEGAD (MISCELLANEOUS) ×3
ELECT REM PT RETURN 9FT ADLT (ELECTROSURGICAL) ×3
ELECTRODE BLDE 4.0 EZ CLN MEGD (MISCELLANEOUS) ×1 IMPLANT
ELECTRODE REM PT RTRN 9FT ADLT (ELECTROSURGICAL) ×1 IMPLANT
GLOVE BIOGEL PI IND STRL 7.0 (GLOVE) ×1 IMPLANT
GLOVE BIOGEL PI INDICATOR 7.0 (GLOVE) ×2
GLOVE ECLIPSE 7.0 STRL STRAW (GLOVE) ×6 IMPLANT
GLOVE SKINSENSE NS SZ7.5 (GLOVE) ×2
GLOVE SKINSENSE STRL SZ7.5 (GLOVE) ×1 IMPLANT
GLOVE SURG SYN 7.5  E (GLOVE) ×8
GLOVE SURG SYN 7.5 E (GLOVE) ×4 IMPLANT
GLOVE SURG SYN 7.5 PF PI (GLOVE) ×4 IMPLANT
GOWN STRL REIN XL XLG (GOWN DISPOSABLE) ×3 IMPLANT
GOWN STRL REUS W/ TWL LRG LVL3 (GOWN DISPOSABLE) IMPLANT
GOWN STRL REUS W/ TWL XL LVL3 (GOWN DISPOSABLE) ×1 IMPLANT
GOWN STRL REUS W/TWL LRG LVL3 (GOWN DISPOSABLE)
GOWN STRL REUS W/TWL XL LVL3 (GOWN DISPOSABLE) ×3
HANDPIECE INTERPULSE COAX TIP (DISPOSABLE) ×3
HEAD BIPOLAR PROS AML 57 (Hips) IMPLANT
HEAD FEM STD 28X+1.5 STRL (Hips) ×2 IMPLANT
HOOD PEEL AWAY FLYTE STAYCOOL (MISCELLANEOUS) ×6 IMPLANT
IV NS IRRIG 3000ML ARTHROMATIC (IV SOLUTION) ×3 IMPLANT
KIT BASIN OR (CUSTOM PROCEDURE TRAY) ×3 IMPLANT
MARKER SKIN DUAL TIP RULER LAB (MISCELLANEOUS) ×3 IMPLANT
NDL SPNL 18GX3.5 QUINCKE PK (NEEDLE) ×1 IMPLANT
NEEDLE SPNL 18GX3.5 QUINCKE PK (NEEDLE) ×3 IMPLANT
PACK TOTAL JOINT (CUSTOM PROCEDURE TRAY) ×3 IMPLANT
PACK UNIVERSAL I (CUSTOM PROCEDURE TRAY) ×3 IMPLANT
RETRACTOR WND ALEXIS 18 MED (MISCELLANEOUS) IMPLANT
RTRCTR WOUND ALEXIS 18CM MED (MISCELLANEOUS)
SAW OSC TIP CART 19.5X105X1.3 (SAW) ×3 IMPLANT
SET HNDPC FAN SPRY TIP SCT (DISPOSABLE) ×1 IMPLANT
STAPLER VISISTAT 35W (STAPLE) IMPLANT
STEM CORAIL KA16 (Stem) ×2 IMPLANT
SUT ETHIBOND 2 V 37 (SUTURE) ×3 IMPLANT
SUT ETHILON 3 0 PS 1 (SUTURE) ×4 IMPLANT
SUT VIC AB 1 CTX 36 (SUTURE) ×3
SUT VIC AB 1 CTX36XBRD ANBCTR (SUTURE) ×1 IMPLANT
SUT VIC AB 2-0 CT1 27 (SUTURE) ×6
SUT VIC AB 2-0 CT1 TAPERPNT 27 (SUTURE) ×2 IMPLANT
SYR 50ML LL SCALE MARK (SYRINGE) ×3 IMPLANT
TOWEL GREEN STERILE (TOWEL DISPOSABLE) ×3 IMPLANT
TRAY CATH 16FR W/PLASTIC CATH (SET/KITS/TRAYS/PACK) IMPLANT
TRAY FOLEY CATH 16FR SILVER (SET/KITS/TRAYS/PACK) ×2 IMPLANT
YANKAUER SUCT BULB TIP NO VENT (SUCTIONS) ×3 IMPLANT

## 2019-03-17 NOTE — Progress Notes (Signed)
PROGRESS NOTE    [name] [name]  IHK:[id number] DOB: [birth year] DOA: 03/16/2019 PCP: Leanna Battles, MD    Brief Narrative:   [name] [name] is a 85 y.o. male with medical history significant for dementia, hyperlipidemia, GERD, depression, anxiety, recent right hip fracture status post surgery in mid June, presents after a fall from a chair on 03/08/2019.  History gotten from chart review as patient has dementia.  X-ray at the facility showed left hip fracture.  Patient has been complaining of ongoing left hip pain since the fall.  Brought in from the SNF.  ED Course: Patient's vitals remained stable, labs showed mild hyponatremia and normocytic anemia. Patient is COVID negative.  Repeat x-ray of the hip showed acute fracture of the proximal left femur with significant interval displacement.  Dr. Erlinda Hong from orthopedics was consulted, plan for surgery on 03/17/2019  Assessment & Plan:   Principal Problem:   Closed left hip fracture [facility]) Active Problems:   Fracture dislocation of left hip joint ([place])   Left proximal femur fracture Patient presenting from SNF following recent fall on 03/08/2019 with persistent left hip pain.  X-ray notable for left proximal femur fracture. --Orthopedics, Dr. Erlinda Hong following; appreciate assistance --Plan surgical intervention this afternoon --Norco 5-325 mg q4h prn for pain control; morphine 0.5 mg IV q4h prn breakthrough pain --Robaxin 500 mg p.o. every 8 hours as needed for muscle spasms --PT/OT eval following surgery --DVT prophylaxis per orthopedics following surgery --Likely plan to return to [facility] health and rehab; case management aware  Hyponatremia Etiology likely secondary to hypovolemic hyponatremia from dehydration. --Na 131-->133 --Continue gentle IV fluid hydration --Continue to monitor sodium level daily  Dementia Currently with a one-to-one sitter for confusion; although likely at baseline --Continue trazodone 50 mg p.o. nightly  --Fall precautions   DVT prophylaxis: SCDs for now, Lovenox postop per orthopedics Code Status: Full code Family Communication: None Disposition Plan: Continue inpatient, plan surgical intervention this afternoon, likely to return to [facility] and rehab when medically ready   Consultants:   Orthopedics, Dr. Erlinda Hong  Procedures:   None  Antimicrobials:   Perioperative cefazolin   Subjective: Patient seen and examined at bedside, pleasantly confused.  Pain controlled.  Sitter at bedside.  Awaiting surgical intervention this afternoon.  No other complaints at this time.  Denies headache, no chest pain, no palpitations, no shortness of breath, no abdominal pain.  No acute events overnight per nurse staff.  Objective: Vitals:   03/16/19 1940 03/17/19 0348 03/17/19 0836 03/17/19 1030  BP: 118/62 (!) 106/55 (!) 103/58   Pulse: 92 83    Resp: 13 13    Temp: 97.8 F (36.6 C) 98.7 F (37.1 C) 98.2 F (36.8 C)   TempSrc: Oral Oral Oral   SpO2: 98% 95%    Weight:    65.1 kg  Height:    6\' 2"  (1.88 m)    Intake/Output Summary (Last 24 hours) at 03/17/2019 1241 Last data filed at 03/17/2019 0601 Gross per 24 hour  Intake 460 ml  Output 350 ml  Net 110 ml   Filed Weights   03/16/19 1624 03/17/19 1030  Weight: 65.1 kg 65.1 kg    Examination:  General exam: Appears calm and comfortable, pleasantly confused Respiratory system: Clear to auscultation. Respiratory effort normal. Cardiovascular system: S1 & S2 heard, RRR. No JVD, murmurs, rubs, gallops or clicks. No pedal edema. Gastrointestinal system: Abdomen is nondistended, soft and nontender. No organomegaly or masses felt. Normal bowel sounds heard. Central  nervous system: Alert and oriented. No focal neurological deficits. Extremities: Symmetric 5 x 5 power.  Moving all extremities independently. Skin: No rashes, lesions or ulcers Psychiatry: Pleasantly confused, does not follow commands    Data Reviewed: I have  personally reviewed following labs and imaging studies  CBC: Recent Labs  Lab 03/16/19 0415 03/17/19 0234  WBC 5.5 4.3  NEUTROABS 3.5  --   HGB 10.8* 9.9*  HCT 33.4* 30.0*  MCV 91.3 88.0  PLT 428* 007*   Basic Metabolic Panel: Recent Labs  Lab 03/16/19 0415 03/17/19 0234  NA 131* 133*  K 4.5 4.6  CL 96* 99  CO2 28 26  GLUCOSE 112* 97  BUN 28* 22  CREATININE 0.91 0.81  CALCIUM 8.5* 8.4*   GFR: Estimated Creatinine Clearance: 61.4 mL/min (by C-G formula based on SCr of 0.81 mg/dL). Liver Function Tests: No results for input(s): AST, ALT, ALKPHOS, BILITOT, PROT, ALBUMIN in the last 168 hours. No results for input(s): LIPASE, AMYLASE in the last 168 hours. No results for input(s): AMMONIA in the last 168 hours. Coagulation Profile: Recent Labs  Lab 03/16/19 0605  INR 1.0   Cardiac Enzymes: No results for input(s): CKTOTAL, CKMB, CKMBINDEX, TROPONINI in the last 168 hours. BNP (last 3 results) No results for input(s): PROBNP in the last 8760 hours. HbA1C: No results for input(s): HGBA1C in the last 72 hours. CBG: No results for input(s): GLUCAP in the last 168 hours. Lipid Profile: No results for input(s): CHOL, HDL, LDLCALC, TRIG, CHOLHDL, LDLDIRECT in the last 72 hours. Thyroid Function Tests: No results for input(s): TSH, T4TOTAL, FREET4, T3FREE, THYROIDAB in the last 72 hours. Anemia Panel: Recent Labs    03/17/19 0234  VITAMINB12 284  FOLATE 12.9  FERRITIN 273  TIBC 209*  IRON 35*   Sepsis Labs: No results for input(s): PROCALCITON, LATICACIDVEN in the last 168 hours.  Recent Results (from the past 240 hour(s))  SARS Coronavirus 2 (CEPHEID - Performed in [facility] hospital lab), Hosp Order     Status: None   Collection Time: 03/16/19  4:15 AM   Specimen: Nasopharyngeal Swab  Result Value Ref Range Status   SARS Coronavirus 2 NEGATIVE NEGATIVE Final    Comment: (NOTE) If result is NEGATIVE SARS-CoV-2 target nucleic acids are NOT DETECTED. The  SARS-CoV-2 RNA is generally detectable in upper and lower  respiratory specimens during the acute phase of infection. The lowest  concentration of SARS-CoV-2 viral copies this assay can detect is 250  copies / mL. A negative result does not preclude SARS-CoV-2 infection  and should not be used as the sole basis for treatment or other  patient management decisions.  A negative result may occur with  improper specimen collection / handling, submission of specimen other  than nasopharyngeal swab, presence of viral mutation(s) within the  areas targeted by this assay, and inadequate number of viral copies  (<250 copies / mL). A negative result must be combined with clinical  observations, patient history, and epidemiological information. If result is POSITIVE SARS-CoV-2 target nucleic acids are DETECTED. The SARS-CoV-2 RNA is generally detectable in upper and lower  respiratory specimens dur ing the acute phase of infection.  Positive  results are indicative of active infection with SARS-CoV-2.  Clinical  correlation with patient history and other diagnostic information is  necessary to determine patient infection status.  Positive results do  not rule out bacterial infection or co-infection with other viruses. If result is PRESUMPTIVE POSTIVE SARS-CoV-2 nucleic acids MAY  BE PRESENT.   A presumptive positive result was obtained on the submitted specimen  and confirmed on repeat testing.  While 2019 novel coronavirus  (SARS-CoV-2) nucleic acids may be present in the submitted sample  additional confirmatory testing may be necessary for epidemiological  and / or clinical management purposes  to differentiate between  SARS-CoV-2 and other Sarbecovirus currently known to infect humans.  If clinically indicated additional testing with an alternate test  methodology [phone]) is advised. The SARS-CoV-2 RNA is generally  detectable in upper and lower respiratory sp ecimens during the acute   phase of infection. The expected result is Negative. Fact Sheet for Patients:  StrictlyIdeas.no Fact Sheet for Healthcare Providers: BankingDealers.co.za This test is not yet approved or cleared by the [place] FDA and has been authorized for detection and/or diagnosis of SARS-CoV-2 by FDA under an Emergency Use Authorization (EUA).  This EUA will remain in effect (meaning this test can be used) for the duration of the COVID-19 declaration under Section 564(b)(1) of the Act, 21 U.S.C. section 360bbb-3(b)(1), unless the authorization is terminated or revoked sooner. Performed at [facility], [place] [address], [place], [place] [address]   MRSA PCR Screening     Status: Abnormal   Collection Time: 03/16/19  7:11 PM   Specimen: Nasal Mucosa; Nasopharyngeal  Result Value Ref Range Status   MRSA by PCR POSITIVE (A) NEGATIVE Final    Comment:        The GeneXpert MRSA Assay (FDA approved for NASAL specimens only), is one component of a comprehensive MRSA colonization surveillance program. It is not intended to diagnose MRSA infection nor to guide or monitor treatment for MRSA infections. RESULT CALLED TO, READ BACK BY AND VERIFIED WITH: Saul Fordyce RN 03/16/19 2112 JDW Performed at [facility], [place] [address], [place], [place] [address]          Radiology Studies: Dg Chest Port 1 View  Result Date: 03/16/2019 CLINICAL DATA:  Preop hip fracture. EXAM: PORTABLE CHEST 1 VIEW COMPARISON:  Radiograph 02/19/2019 FINDINGS: The cardiomediastinal contours are normal. Aortic atherosclerosis. Pulmonary vasculature is normal. No consolidation, pleural effusion, or pneumothorax. Skin folds project over the left chest. No acute osseous abnormalities are seen. IMPRESSION: 1. No acute chest finding. 2.  Aortic Atherosclerosis (ICD10-I70.0). Electronically Signed   By: Keith Rake M.D.   On: 03/16/2019 03:58   Dg Hip  Unilat W Or Wo Pelvis 2-3 Views Left  Result Date: 03/16/2019 CLINICAL DATA:  Pain EXAM: DG HIP (WITH OR WITHOUT PELVIS) 2-3V LEFT COMPARISON:  March 03, 2019 FINDINGS: Again noted is a transcervical fracture of the proximal left femur with interval increase in displacement. There is surrounding soft tissue swelling. There is no dislocation. The patient is status post prior percutaneous pinning of the right hip. IMPRESSION: Acute fracture of the proximal left femur with significant interval displacement when compared to prior study. Electronically Signed   By: Constance Holster M.D.   On: 03/16/2019 03:37        Scheduled Meds: . [MAR Hold] Chlorhexidine Gluconate Cloth  6 each Topical Q0600  . [MAR Hold] docusate sodium  100 mg Oral BID  . [MAR Hold] hydrocortisone cream  1 application Topical BID  . [MAR Hold] mupirocin ointment  1 application Nasal BID  . [MAR Hold] nystatin cream  1 application Topical BID  . tranexamic acid (CYKLOKAPRON) topical -INTRAOP  2,000 mg Topical To OR  . [MAR Hold] traZODone  50 mg Oral  QHS   Continuous Infusions: . sodium chloride 75 mL/hr at 03/16/19 2308  . lactated ringers       LOS: 1 day    Time spent: 30 minutes     J [place] DO [facility] Pager [phone]  If 7PM-7AM, please contact night-coverage [email or web address] Password [facility] 03/17/2019, 12:41 PM

## 2019-03-17 NOTE — Transfer of Care (Signed)
Immediate Anesthesia Transfer of Care Note  Patient: [name] [name]  Procedure(s) Performed: ANTERIOR APPROACH HEMI HIP ARTHROPLASTY (Left Hip)  Patient Location: PACU  Anesthesia Type:General  Level of Consciousness: patient cooperative and confused  Airway & Oxygen Therapy: Patient Spontanous Breathing and Patient connected to nasal cannula oxygen  Post-op Assessment: Report given to RN, Post -op Vital signs reviewed and stable and Patient moving all extremities  Post vital signs: Reviewed and stable  Last Vitals:  Vitals Value Taken Time  BP    Temp    Pulse 58 03/17/19 1332  Resp 11 03/17/19 1332  SpO2 97 % 03/17/19 1332  Vitals shown include unvalidated device data.  Last Pain:  Vitals:   03/17/19 0836  TempSrc: Oral  PainSc:          Complications: No apparent anesthesia complications

## 2019-03-17 NOTE — Progress Notes (Addendum)
Pt is confused, sleepy this morning. NPO maint. 1:1 safety sitter present.  Report given to [name] [name] at Short stay. Pt to short stay @1015 . 1520 Received pt back from PACU, very sleepy. V/S stable, left hip dressing dry and intact. [place] pt's POA updated about the pt.

## 2019-03-17 NOTE — Anesthesia Procedure Notes (Signed)
Procedure Name: Intubation Date/Time: 03/17/2019 11:25 AM Performed by: Moshe Salisbury, CRNA Pre-anesthesia Checklist: Patient identified, Emergency Drugs available, Suction available and Patient being monitored Patient Re-evaluated:Patient Re-evaluated prior to induction Oxygen Delivery Method: Circle System Utilized Preoxygenation: Pre-oxygenation with 100% oxygen Induction Type: IV induction Ventilation: Mask ventilation without difficulty Laryngoscope Size: Mac and 4 Grade View: Grade III Tube type: Oral Tube size: 8.0 mm Number of attempts: 1 Airway Equipment and Method: Stylet Placement Confirmation: ETT inserted through vocal cords under direct vision,  positive ETCO2 and breath sounds checked- equal and bilateral Secured at: 22 cm Tube secured with: Tape Dental Injury: Teeth and Oropharynx as per pre-operative assessment

## 2019-03-17 NOTE — Discharge Instructions (Signed)
° ° °  1. Change dressings as needed °2. May shower but keep incisions covered and dry °3. Take lovenox to prevent blood clots °4. Take stool softeners as needed °5. Take pain meds as needed ° °

## 2019-03-17 NOTE — H&P (Signed)

## 2019-03-17 NOTE — Anesthesia Preprocedure Evaluation (Signed)
Anesthesia Evaluation  Patient identified by MRN, date of birth, ID band Patient awake    Reviewed: Allergy & Precautions, NPO status , Patient's Chart, lab work & pertinent test results  Airway Mallampati: II  TM Distance: >3 FB Neck ROM: Full    Dental no notable dental hx.    Pulmonary sleep apnea ,    Pulmonary exam normal breath sounds clear to auscultation       Cardiovascular negative cardio ROS Normal cardiovascular exam Rhythm:Regular Rate:Normal     Neuro/Psych Anxiety Depression Dementia negative neurological ROS  negative psych ROS   GI/Hepatic Neg liver ROS, GERD  ,  Endo/Other  negative endocrine ROS  Renal/GU negative Renal ROS  negative genitourinary   Musculoskeletal  (+) Arthritis ,   Abdominal   Peds negative pediatric ROS (+)  Hematology negative hematology ROS (+)   Anesthesia Other Findings   Reproductive/Obstetrics negative OB ROS                             Anesthesia Physical  Anesthesia Plan  ASA: III  Anesthesia Plan: General   Post-op Pain Management:    Induction: Intravenous  PONV Risk Score and Plan: 2 and Ondansetron, Midazolam and Treatment may vary due to age or medical condition  Airway Management Planned: Oral ETT  Additional Equipment:   Intra-op Plan:   Post-operative Plan: Extubation in OR  Informed Consent: I have reviewed the patients History and Physical, chart, labs and discussed the procedure including the risks, benefits and alternatives for the proposed anesthesia with the patient or authorized representative who has indicated his/her understanding and acceptance.     Dental advisory given  Plan Discussed with: CRNA  Anesthesia Plan Comments:         Anesthesia Quick Evaluation

## 2019-03-17 NOTE — Op Note (Signed)
ANTERIOR APPROACH HEMI HIP ARTHROPLASTY  Procedure Note [name] [name]   [id number]  Pre-op Diagnosis: Left hip fracture     Post-op Diagnosis: same   Operative Procedures  1. Prosthetic replacement for femoral neck fracture. CPT [id number]  Personnel  Surgeon(s): Leandrew Koyanagi, MD  ASSIST: Madalyn Rob, PA-C; necessary for the timely completion of procedure and due to complexity of procedure.   Anesthesia: general  Prosthesis: Depuy Femur: Corail KA 16 Head: 57 mm size: +1.5 Bearing Type: bipolar  Hip Hemiarthroplasty (Anterior Approach) Op Note:  After informed consent was obtained and the operative extremity marked in the holding area, the patient was brought back to the operating room and placed supine on the HANA table. Next, the operative extremity was prepped and draped in normal sterile fashion. Surgical timeout occurred verifying patient identification, surgical site, surgical procedure and administration of antibiotics.  A modified anterior Smith-Peterson approach to the hip was performed, using the interval between tensor fascia lata and sartorius.  Dissection was carried bluntly down onto the anterior hip capsule. The lateral femoral circumflex vessels were identified and coagulated. A capsulotomy was performed and the capsular flaps tagged for later repair.  Fluoroscopy was utilized to prepare for the femoral neck cut. The neck osteotomy was performed. The femoral head was removed and found a 57 mm head was the appropriate fit.    We then turned our attention to the femur.  After placing the femoral hook, the leg was taken to externally rotated, extended and adducted position taking care to perform soft tissue releases to allow for adequate mobilization of the femur. Soft tissue was cleared from the shoulder of the greater trochanter and the hook elevator used to improve exposure of the proximal femur. Sequential broaching performed up to a size 16. Trial neck and  head were placed. The leg was brought back up to neutral and the construct reduced. The position and sizing of components, offset and leg lengths were checked using fluoroscopy. Stability of the construct was checked in extension and external rotation without any subluxation or impingement of prosthesis. We dislocated the prosthesis, dropped the leg back into position, removed trial components, and irrigated copiously. The final stem and head was then placed, the leg brought back up, the system reduced and fluoroscopy used to verify positioning.  We irrigated, obtained hemostasis and closed the capsule using #2 ethibond suture.  The fascia was closed with #1 vicryl plus, the deep fat layer was closed with 0 vicryl, the subcutaneous layers closed with 2.0 Vicryl Plus and the skin closed with staples. A sterile dressing was applied. The patient was awakened in the operating room and taken to recovery in stable condition. All sponge, needle, and instrument counts were correct at the end of the case.   Position: supine  Complications: see description of procedure.  Time Out: performed   Drains/Packing: none  Estimated blood loss: see anesthesia record  Returned to Recovery Room: in good condition.   Antibiotics: yes   Mechanical VTE (DVT) Prophylaxis: sequential compression devices, TED thigh-high  Chemical VTE (DVT) Prophylaxis: lovenox  Fluid Replacement: Crystalloid: see anesthesia record  Specimens Removed: 1 to pathology   Sponge and Instrument Count Correct? yes   PACU: portable radiograph - low AP   Admission: inpatient status, start PT & OT POD#1  Plan/RTC: Return in 2 weeks for staple removal. Return in 6 weeks to see MD.  Weight Bearing/Load Lower Extremity: full  Hip precautions: none  N. Eduard Roux,  MD Marga Hoots 12:59 PM   Implant Name Type Inv. Item Serial No. Manufacturer Lot No. LRB No. Used Action  HIP BALL ARTICU DEPUY - [id number] Hips HIP BALL ARTICU  DEPUY  DEPUY SYNTHES [id number] Left 1 Implanted  BIPOLAR PROS AML 57MM - [id number] Hips BIPOLAR PROS AML 57MM  DEPUY SYNTHES [id number] Left 1 Implanted  STEM CORAIL KA16 - [id number] Stem STEM CORAIL KA16  DEPUY SYNTHES [id number] Left 1 Implanted

## 2019-03-18 LAB — BASIC METABOLIC PANEL
Anion gap: 8 (ref 5–15)
BUN: 16 mg/dL (ref 8–23)
CO2: 27 mmol/L (ref 22–32)
Calcium: 8.4 mg/dL — ABNORMAL LOW (ref 8.9–10.3)
Chloride: 97 mmol/L — ABNORMAL LOW (ref 98–111)
Creatinine, Ser: 0.84 mg/dL (ref 0.61–1.24)
GFR calc Af Amer: >60 mL/min (ref >60)
GFR calc non Af Amer: >60 mL/min (ref >60)
Glucose, Bld: 130 mg/dL — ABNORMAL HIGH (ref 70–99)
Potassium: 4.6 mmol/L (ref 3.5–5.1)
Sodium: 132 mmol/L — ABNORMAL LOW (ref 135–145)

## 2019-03-18 LAB — CBC
HCT: 29.2 % — ABNORMAL LOW (ref 39.0–52.0)
Hemoglobin: 9.4 g/dL — ABNORMAL LOW (ref 13.0–17.0)
MCH: 29 pg (ref 26.0–34.0)
MCHC: 32.2 g/dL (ref 30.0–36.0)
MCV: 90.1 fL (ref 80.0–100.0)
Platelets: 410 10*3/uL — ABNORMAL HIGH (ref 150–400)
RBC: 3.24 MIL/uL — ABNORMAL LOW (ref 4.22–5.81)
RDW: 12.8 % (ref 11.5–15.5)
WBC: 6.6 10*3/uL (ref 4.0–10.5)
nRBC: 0 % (ref 0.0–0.2)

## 2019-03-18 LAB — MAGNESIUM: Magnesium: 1.7 mg/dL (ref 1.7–2.4)

## 2019-03-18 NOTE — Evaluation (Signed)
Physical Therapy Evaluation Patient Details Name: [name] [name] MRN: [id number] DOB: 06-07-1931 Today's Date: 03/18/2019   History of Present Illness  Pt is a 85 y/o male with medical history significant for dementia, depression, anxiety, recent right hip fracture status post surgery in mid June, presents after a fall from a chair on 03/08/2019. X-ray reveals L proximal femur fx withsignificant interval displacement. S/P hemiarthroplasty (anterior approach) 7/10.  Post op WBAT, NO precautions.     Clinical Impression  Pt admitted with above diagnosis. Pt currently with functional limitations due to the deficits listed below (see PT Problem List). On eval, pt required mod assist bed mobility, +2 min assist transfers and +2 min assist ambulation 7 feet with RW. Gait distance limited by pain and fatigue. Pt will benefit from skilled PT to increase their independence and safety with mobility to allow discharge to the venue listed below.       Follow Up Recommendations SNF;Supervision/Assistance - 24 hour    Equipment Recommendations  Other (comment)(defer to next venue)    Recommendations for Other Services       Precautions / Restrictions Precautions Precautions: Fall Restrictions Weight Bearing Restrictions: Yes RLE Weight Bearing: Weight bearing as tolerated LLE Weight Bearing: Weight bearing as tolerated      Mobility  Bed Mobility Overal bed mobility: Needs Assistance Bed Mobility: Supine to Sit     Supine to sit: HOB elevated;Mod assist Sit to supine: HOB elevated;Mod assist   General bed mobility comments: assist to initate movement of LEs towards EOB and support trunk  Transfers Overall transfer level: Needs assistance Equipment used: Rolling walker (2 wheeled) Transfers: Sit to/from Stand Sit to Stand: Min assist;+2 physical assistance         General transfer comment: min assist to power up to standing, for safety/balance; multimodal cueing required to  transition to sitting into recliner   Ambulation/Gait Ambulation/Gait assistance: +2 safety/equipment;Min assist;+2 physical assistance Gait Distance (Feet): 7 Feet Assistive device: Rolling walker (2 wheeled) Gait Pattern/deviations: Antalgic;Step-through pattern;Decreased stride length;Trunk flexed Gait velocity: Decreased   General Gait Details: frequent cues to stay on task, assist to manage RW and maintain balance, distance limited by pain and fatigue  Stairs            Wheelchair Mobility    Modified Rankin (Stroke Patients Only)       Balance Overall balance assessment: Needs assistance Sitting-balance support: No upper extremity supported;Feet supported Sitting balance-Leahy Scale: Fair     Standing balance support: Bilateral upper extremity supported;During functional activity Standing balance-Leahy Scale: Poor Standing balance comment: relaint on B UE support                             Pertinent Vitals/Pain Pain Assessment: Faces Faces Pain Scale: Hurts even more Pain Location: R hip, increased with mobility Pain Descriptors / Indicators: Grimacing;Guarding;Discomfort Pain Intervention(s): Limited activity within patient's tolerance;Repositioned    Home Living Family/patient expects to be discharged to:: Skilled nursing facility                 Additional Comments: Per chart pt from [facility]    Prior Function Level of Independence: Needs assistance         Comments: Pt poor historian with h/o dementia. Per chart, from Memory Care unit at [facility]     Hand Dominance        Extremity/Trunk Assessment   Upper Extremity Assessment Upper  Extremity Assessment: Defer to OT evaluation    Lower Extremity Assessment Lower Extremity Assessment: LLE deficits/detail;RLE deficits/detail RLE Deficits / Details: recent R hip pinning (June 20202) due to fall with fx LLE Deficits / Details: s/p hip hemiarthroplasty     Cervical / Trunk Assessment Cervical / Trunk Assessment: Kyphotic  Communication   Communication: HOH  Cognition Arousal/Alertness: Awake/alert Behavior During Therapy: Flat affect Overall Cognitive Status: History of cognitive impairments - at baseline                                 General Comments: h/o dementia, follows automatic commands with multimodal cueing       General Comments      Exercises     Assessment/Plan    PT Assessment Patient needs continued PT services  PT Problem List Decreased strength;Decreased range of motion;Decreased activity tolerance;Decreased balance;Decreased mobility;Decreased cognition;Decreased knowledge of use of DME;Decreased safety awareness;Decreased knowledge of precautions;Pain       PT Treatment Interventions DME instruction;Gait training;Functional mobility training;Therapeutic activities;Therapeutic exercise;Balance training    PT Goals (Current goals can be found in the Care Plan section)  Acute Rehab PT Goals Patient Stated Goal: None stated PT Goal Formulation: Patient unable to participate in goal setting Time For Goal Achievement: 04/01/19 Potential to Achieve Goals: Fair    Frequency Min 3X/week   Barriers to discharge        Co-evaluation PT/OT/SLP Co-Evaluation/Treatment: Yes Reason for Co-Treatment: Complexity of the patient's impairments (multi-system involvement);For patient/therapist safety;To address functional/ADL transfers;Necessary to address cognition/behavior during functional activity PT goals addressed during session: Mobility/safety with mobility;Balance OT goals addressed during session: ADL's and self-care       AM-PAC PT "6 Clicks" Mobility  Outcome Measure Help needed turning from your back to your side while in a flat bed without using bedrails?: A Little Help needed moving from lying on your back to sitting on the side of a flat bed without using bedrails?: A Lot Help needed  moving to and from a bed to a chair (including a wheelchair)?: A Lot Help needed standing up from a chair using your arms (e.g., wheelchair or bedside chair)?: A Little Help needed to walk in hospital room?: A Lot Help needed climbing 3-5 steps with a railing? : A Lot 6 Click Score: 14    End of Session Equipment Utilized During Treatment: Gait belt Activity Tolerance: Patient limited by pain;Patient limited by fatigue Patient left: in chair;with call bell/phone within reach;with nursing/sitter in room Nurse Communication: Mobility status PT Visit Diagnosis: Other abnormalities of gait and mobility (R26.89);Pain    Time: 2774-1287 PT Time Calculation (min) (ACUTE ONLY): 17 min   Charges:   PT Evaluation $PT Eval Moderate Complexity: 1 Mod          Lorrin Goodell, PT  Office # [phone] Pager [phone]   Lorriane Shire 03/18/2019, 1:23 PM

## 2019-03-18 NOTE — Progress Notes (Signed)
Eating breakfast with NT this morning. Seems to be feeling much better since surgery. Surgical dressing c/d/i. NVI. Stable from ortho stand point to return back to SNF when medically appropriate.

## 2019-03-18 NOTE — Progress Notes (Signed)
PROGRESS NOTE    [name] [name]  WUJ:[id number] DOB: 09-07-1931 DOA: 03/16/2019 PCP: Leanna Battles, MD    Brief Narrative:   [name] [name] is a 85 y.o. male with medical history significant for dementia, hyperlipidemia, GERD, depression, anxiety, recent right hip fracture status post surgery in mid June, presents after a fall from a chair on 03/08/2019.  History gotten from chart review as patient has dementia.  X-ray at the facility showed left hip fracture.  Patient has been complaining of ongoing left hip pain since the fall.  Brought in from the SNF.  ED Course: Patient's vitals remained stable, labs showed mild hyponatremia and normocytic anemia. Patient is COVID negative.  Repeat x-ray of the hip showed acute fracture of the proximal left femur with significant interval displacement.  Dr. Erlinda Hong from orthopedics was consulted, plan for surgery on 03/17/2019  Assessment & Plan:   Principal Problem:   Closed left hip fracture [facility]) Active Problems:   Fracture dislocation of left hip joint ([place])   Left proximal femur fracture Patient presenting from SNF following recent fall on 03/08/2019 with persistent left hip pain.  X-ray notable for left proximal femur fracture. --s/left hip hemiarthroplasty on 03/17/2019 by Dr. Erlinda Hong --Norco 5-325 mg q4h prn for pain control; morphine 0.5 mg IV q4h prn breakthrough pain --Robaxin 500 mg p.o. every 8 hours as needed for muscle spasms --PT/OT eval following surgery --Continue Lovenox for DVT prophylaxis --Likely plan to return to [place] and rehab; case management aware --Will need follow-up with orthopedics 2 weeks for wound check and staple removal  Hyponatremia Etiology likely secondary to hypovolemic hyponatremia from dehydration. --Na 131-->133 --Continue gentle IV fluid hydration --Continue to monitor sodium level daily  Dementia Currently with a one-to-one sitter for confusion; although likely at baseline --Continue  trazodone 50 mg p.o. nightly --Fall precautions   DVT prophylaxis: SCDs for now, Lovenox postop per orthopedics Code Status: Full code Family Communication: None Disposition Plan: Continue inpatient, pending PT/OT evaluation, likely to return to [place] and rehab when medically ready   Consultants:   Orthopedics, Dr. Erlinda Hong  Procedures:   Left hip hemiarthroplasty on 03/17/2019  Antimicrobials:   Perioperative cefazolin   Subjective: Patient seen and examined at bedside, pleasantly confused.  Pain controlled.  Sitter at bedside.  Foley catheter removed this morning.  No other complaints at this time.  Denies headache, no chest pain, no palpitations, no shortness of breath, no abdominal pain.  No acute events overnight per nurse staff.  Objective: Vitals:   03/17/19 1523 03/17/19 1929 03/17/19 2318 03/18/19 0707  BP: 138/74 134/75 118/62 116/64  Pulse: 96 83 87 83  Resp: 16 16 16 16   Temp: 98.6 F (37 C) 97.8 F (36.6 C) 98.7 F (37.1 C) 98.1 F (36.7 C)  TempSrc: Oral Axillary Axillary Oral  SpO2: 96% 100% 97% 96%  Weight:      Height:        Intake/Output Summary (Last 24 hours) at 03/18/2019 1216 Last data filed at 03/18/2019 0900 Gross per 24 hour  Intake 2030.58 ml  Output 925 ml  Net 1105.58 ml   Filed Weights   03/16/19 1624 03/17/19 1030  Weight: 65.1 kg 65.1 kg    Examination:  General exam: Appears calm and comfortable, pleasantly confused Respiratory system: Clear to auscultation. Respiratory effort normal. Cardiovascular system: S1 & S2 heard, RRR. No JVD, murmurs, rubs, gallops or clicks. No pedal edema. Gastrointestinal system: Abdomen is nondistended, soft and nontender. No organomegaly  or masses felt. Normal bowel sounds heard. Central nervous system: Alert. No focal neurological deficits. Extremities: Symmetric 5 x 5 power.  Moving all extremities independently. Skin: No rashes, lesions or ulcers Psychiatry: Pleasantly confused, does  not follow commands    Data Reviewed: I have personally reviewed following labs and imaging studies  CBC: Recent Labs  Lab 03/16/19 0415 03/17/19 0234 03/18/19 0255  WBC 5.5 4.3 6.6  NEUTROABS 3.5  --   --   HGB 10.8* 9.9* 9.4*  HCT 33.4* 30.0* 29.2*  MCV 91.3 88.0 90.1  PLT 428* 432* 086*   Basic Metabolic Panel: Recent Labs  Lab 03/16/19 0415 03/17/19 0234 03/18/19 0255  NA 131* 133* 132*  K 4.5 4.6 4.6  CL 96* 99 97*  CO2 28 26 27   GLUCOSE 112* 97 130*  BUN 28* 22 16  CREATININE 0.91 0.81 0.84  CALCIUM 8.5* 8.4* 8.4*  MG  --   --  1.7   GFR: Estimated Creatinine Clearance: 59.2 mL/min (by C-G formula based on SCr of 0.84 mg/dL). Liver Function Tests: No results for input(s): AST, ALT, ALKPHOS, BILITOT, PROT, ALBUMIN in the last 168 hours. No results for input(s): LIPASE, AMYLASE in the last 168 hours. No results for input(s): AMMONIA in the last 168 hours. Coagulation Profile: Recent Labs  Lab 03/16/19 0605  INR 1.0   Cardiac Enzymes: No results for input(s): CKTOTAL, CKMB, CKMBINDEX, TROPONINI in the last 168 hours. BNP (last 3 results) No results for input(s): PROBNP in the last 8760 hours. HbA1C: No results for input(s): HGBA1C in the last 72 hours. CBG: No results for input(s): GLUCAP in the last 168 hours. Lipid Profile: No results for input(s): CHOL, HDL, LDLCALC, TRIG, CHOLHDL, LDLDIRECT in the last 72 hours. Thyroid Function Tests: No results for input(s): TSH, T4TOTAL, FREET4, T3FREE, THYROIDAB in the last 72 hours. Anemia Panel: Recent Labs    03/17/19 0234  VITAMINB12 284  FOLATE 12.9  FERRITIN 273  TIBC 209*  IRON 35*   Sepsis Labs: No results for input(s): PROCALCITON, LATICACIDVEN in the last 168 hours.  Recent Results (from the past 240 hour(s))  SARS Coronavirus 2 (CEPHEID - Performed in [place] hospital lab), Hosp Order     Status: None   Collection Time: 03/16/19  4:15 AM   Specimen: Nasopharyngeal Swab  Result Value  Ref Range Status   SARS Coronavirus 2 NEGATIVE NEGATIVE Final    Comment: (NOTE) If result is NEGATIVE SARS-CoV-2 target nucleic acids are NOT DETECTED. The SARS-CoV-2 RNA is generally detectable in upper and lower  respiratory specimens during the acute phase of infection. The lowest  concentration of SARS-CoV-2 viral copies this assay can detect is 250  copies / mL. A negative result does not preclude SARS-CoV-2 infection  and should not be used as the sole basis for treatment or other  patient management decisions.  A negative result may occur with  improper specimen collection / handling, submission of specimen other  than nasopharyngeal swab, presence of viral mutation(s) within the  areas targeted by this assay, and inadequate number of viral copies  (<250 copies / mL). A negative result must be combined with clinical  observations, patient history, and epidemiological information. If result is POSITIVE SARS-CoV-2 target nucleic acids are DETECTED. The SARS-CoV-2 RNA is generally detectable in upper and lower  respiratory specimens dur ing the acute phase of infection.  Positive  results are indicative of active infection with SARS-CoV-2.  Clinical  correlation with patient history and  other diagnostic information is  necessary to determine patient infection status.  Positive results do  not rule out bacterial infection or co-infection with other viruses. If result is PRESUMPTIVE POSTIVE SARS-CoV-2 nucleic acids MAY BE PRESENT.   A presumptive positive result was obtained on the submitted specimen  and confirmed on repeat testing.  While 2019 novel coronavirus  (SARS-CoV-2) nucleic acids may be present in the submitted sample  additional confirmatory testing may be necessary for epidemiological  and / or clinical management purposes  to differentiate between  SARS-CoV-2 and other Sarbecovirus currently known to infect humans.  If clinically indicated additional testing with an  alternate test  methodology [phone]) is advised. The SARS-CoV-2 RNA is generally  detectable in upper and lower respiratory sp ecimens during the acute  phase of infection. The expected result is Negative. Fact Sheet for Patients:  StrictlyIdeas.no Fact Sheet for Healthcare Providers: BankingDealers.co.za This test is not yet approved or cleared by the [place] FDA and has been authorized for detection and/or diagnosis of SARS-CoV-2 by FDA under an Emergency Use Authorization (EUA).  This EUA will remain in effect (meaning this test can be used) for the duration of the COVID-19 declaration under Section 564(b)(1) of the Act, 21 U.S.C. section 360bbb-3(b)(1), unless the authorization is terminated or revoked sooner. Performed at [facility], [place] [address], [place], [place] [address]   MRSA PCR Screening     Status: Abnormal   Collection Time: 03/16/19  7:11 PM   Specimen: Nasal Mucosa; Nasopharyngeal  Result Value Ref Range Status   MRSA by PCR POSITIVE (A) NEGATIVE Final    Comment:        The GeneXpert MRSA Assay (FDA approved for NASAL specimens only), is one component of a comprehensive MRSA colonization surveillance program. It is not intended to diagnose MRSA infection nor to guide or monitor treatment for MRSA infections. RESULT CALLED TO, READ BACK BY AND VERIFIED WITH: Saul Fordyce RN 03/16/19 2112 JDW Performed at [facility], [place] [address] [place], [place] [address]          Radiology Studies: Pelvis Portable  Result Date: 03/17/2019 CLINICAL DATA:  Hip replacement.  Pain. EXAM: PORTABLE PELVIS 1-2 VIEWS COMPARISON:  None. FINDINGS: The patient is status post prior total hip arthroplasty on the left. The alignment appears near anatomic. The patient is status post prior percutaneous pinning of the right hip. There are advanced degenerative changes of the right hip. There is no acute  displaced fracture. No dislocation. IMPRESSION: No acute osseous abnormality. Electronically Signed   By: Constance Holster M.D.   On: 03/17/2019 14:58   Dg C-arm 1-60 Min  Result Date: 03/17/2019 CLINICAL DATA:  LEFT anterior approach hemi hip arthroplasty for LEFT hip fracture. EXAM: OPERATIVE LEFT HIP (WITH PELVIS IF PERFORMED) to VIEWS TECHNIQUE: Fluoroscopic spot image(s) were submitted for interpretation post-operatively. COMPARISON:  None. FINDINGS: Intraoperative fluoroscopic images of the LEFT hip showing arthroplasty hardware. Hardware appears appropriately positioned. Fluoroscopy provided for 16 seconds. IMPRESSION: Intraoperative fluoroscopic images showing LEFT hip arthroplasty hardware. No evidence of surgical complicating feature. Electronically Signed   By: Franki Cabot M.D.   On: 03/17/2019 13:14   Dg Hip Operative Unilat W Or W/o Pelvis Left  Result Date: 03/17/2019 CLINICAL DATA:  LEFT anterior approach hemi hip arthroplasty for LEFT hip fracture. EXAM: OPERATIVE LEFT HIP (WITH PELVIS IF PERFORMED) to VIEWS TECHNIQUE: Fluoroscopic spot image(s) were submitted for interpretation post-operatively. COMPARISON:  None. FINDINGS: Intraoperative fluoroscopic images  of the LEFT hip showing arthroplasty hardware. Hardware appears appropriately positioned. Fluoroscopy provided for 16 seconds. IMPRESSION: Intraoperative fluoroscopic images showing LEFT hip arthroplasty hardware. No evidence of surgical complicating feature. Electronically Signed   By: Franki Cabot M.D.   On: 03/17/2019 13:14        Scheduled Meds: . Chlorhexidine Gluconate Cloth  6 each Topical Q0600  . docusate sodium  100 mg Oral BID  . enoxaparin (LOVENOX) injection  40 mg Subcutaneous Q24H  . hydrocortisone cream  1 application Topical BID  . mupirocin ointment  1 application Nasal BID  . nystatin cream  1 application Topical BID  . traZODone  50 mg Oral QHS   Continuous Infusions: . sodium chloride 20 mL/hr  at 03/18/19 0918  . lactated ringers    . methocarbamol (ROBAXIN) IV       LOS: 2 days    Time spent: 30 minutes    Eric J [place] DO [facility] Pager [phone]  If 7PM-7AM, please contact night-coverage [email or web address] Password [facility] 03/18/2019, 12:16 PM

## 2019-03-18 NOTE — Evaluation (Signed)
Occupational Therapy Evaluation Patient Details Name: [name] [name] MRN: [id number] DOB: 12/18/1930 Today's Date: 03/18/2019    History of Present Illness Pt is a 85 y/o male with medical history significant for dementia, depression, anxiety, recent right hip fracture status post surgery in mid June, presents after a fall from a chair on 03/08/2019. X-ray reveals L proximal femur fx withsignificant interval displacement. S/P hemiarthroplasty (anterior approach) 7/10.  Post op WBAT, NO precautions.    Clinical Impression   Per chart, patient from SNF ([facility]).  Pt is a poor historian and unable to provide PLOF due to hx of dementia.  Patient currently requires min assist -total assist for ADLs, completes bed mobility with mod assist, and transfers using RW with min assist +2 safety/physical support.  He is able to follow 1 step commands inconsistently, does well with automatic commands. Limited mobility today due to pain in L LE with weight bearing. He will benefit from continued OT services while admitted and after dc at SNF in order to maximize return to PLOF, decrease burden of care and fall prevention.    Follow Up Recommendations  SNF;Supervision/Assistance - 24 hour    Equipment Recommendations  None recommended by OT    Recommendations for Other Services       Precautions / Restrictions Precautions Precautions: Fall Restrictions Weight Bearing Restrictions: Yes LLE Weight Bearing: Weight bearing as tolerated      Mobility Bed Mobility Overal bed mobility: Needs Assistance Bed Mobility: Sit to Supine       Sit to supine: HOB elevated;Mod assist   General bed mobility comments: assist to initate movement of LEs towards EOB and support trunk   Transfers Overall transfer level: Needs assistance Equipment used: Rolling walker (2 wheeled) Transfers: Sit to/from Stand Sit to Stand: Min assist;+2 safety/equipment;+2 physical assistance          General transfer comment: min assist to power up to standing, for safety/balance; multimodal cueing required to transition to sitting into recliner     Balance Overall balance assessment: Needs assistance Sitting-balance support: No upper extremity supported;Feet supported Sitting balance-Leahy Scale: Fair     Standing balance support: Bilateral upper extremity supported;During functional activity Standing balance-Leahy Scale: Poor Standing balance comment: relaint on B UE support                           ADL either performed or assessed with clinical judgement   ADL Overall ADL's : Needs assistance/impaired     Grooming: Minimal assistance;Sitting   Upper Body Bathing: Moderate assistance;Sitting   Lower Body Bathing: Maximal assistance;Sit to/from stand;+2 for physical assistance       Lower Body Dressing: Total assistance;+2 for physical assistance;Sit to/from stand Lower Body Dressing Details (indicate cue type and reason): total assist to don socks, minA +2 sit<>stand  Toilet Transfer: Minimal assistance;+2 for physical assistance;+2 for safety/equipment;Ambulation;RW Toilet Transfer Details (indicate cue type and reason): simulated to recliner         Functional mobility during ADLs: Minimal assistance;+2 for physical assistance;+2 for safety/equipment;Rolling walker General ADL Comments: pt talkative, but difficult to understand.  noted increased pain in L LE with mobility but unable to verbalize pain     Vision         Perception     Praxis      Pertinent Vitals/Pain Pain Assessment: Faces Faces Pain Scale: Hurts even more Pain Location: R hip, increased with mobility Pain Descriptors /  Indicators: Grimacing;Guarding;Discomfort Pain Intervention(s): Limited activity within patient's tolerance;Repositioned;Monitored during session     Hand Dominance     Extremity/Trunk Assessment Upper Extremity Assessment Upper Extremity Assessment:  Generalized weakness   Lower Extremity Assessment Lower Extremity Assessment: Defer to PT evaluation   Cervical / Trunk Assessment Cervical / Trunk Assessment: Kyphotic   Communication Communication Communication: HOH   Cognition Arousal/Alertness: Awake/alert Behavior During Therapy: Flat affect Overall Cognitive Status: History of cognitive impairments - at baseline                                 General Comments: h/o dementia, follows automatic commands with multimodal cueing    General Comments       Exercises     Shoulder Instructions      Home Living Family/patient expects to be discharged to:: Skilled nursing facility                                 Additional Comments: Per chart, from [facility]       Prior Functioning/Environment Level of Independence: Needs assistance        Comments: Pt poor historian with h/o dementia. Per chart, from Memory Care unit at SNF        OT Problem List: Decreased activity tolerance;Impaired balance (sitting and/or standing);Decreased cognition;Decreased safety awareness;Decreased knowledge of use of DME or AE;Pain      OT Treatment/Interventions: Self-care/ADL training;Therapeutic exercise;DME and/or AE instruction;Therapeutic activities;Patient/family education;Balance training    OT Goals(Current goals can be found in the care plan section) Acute Rehab OT Goals Patient Stated Goal: None stated OT Goal Formulation: Patient unable to participate in goal setting Time For Goal Achievement: 04/01/19 Potential to Achieve Goals: Fair  OT Frequency: Min 2X/week   Barriers to D/C:            Co-evaluation PT/OT/SLP Co-Evaluation/Treatment: Yes Reason for Co-Treatment: Complexity of the patient's impairments (multi-system involvement);Necessary to address cognition/behavior during functional activity;For patient/therapist safety;To address functional/ADL transfers   OT goals  addressed during session: ADL's and self-care      AM-PAC OT "6 Clicks" Daily Activity     Outcome Measure Help from another person eating meals?: A Little Help from another person taking care of personal grooming?: A Little Help from another person toileting, which includes using toliet, bedpan, or urinal?: A Lot Help from another person bathing (including washing, rinsing, drying)?: A Lot Help from another person to put on and taking off regular upper body clothing?: A Little Help from another person to put on and taking off regular lower body clothing?: Total 6 Click Score: 14   End of Session Equipment Utilized During Treatment: Gait belt;Rolling walker Nurse Communication: Mobility status  Activity Tolerance: Patient tolerated treatment well Patient left: in chair;with call bell/phone within reach;with nursing/sitter in room  OT Visit Diagnosis: Unsteadiness on feet (R26.81);Repeated falls (R29.6);Muscle weakness (generalized) (M62.81);Pain Pain - Right/Left: Left Pain - part of body: Hip                Time: 6979-4801 OT Time Calculation (min): 16 min Charges:  OT General Charges $OT Visit: 1 Visit OT Evaluation $OT Eval Moderate Complexity: [place], OT Acute Rehabilitation Services Pager [phone] Office [phone]   Delight Stare 03/18/2019, 12:22 PM

## 2019-03-19 NOTE — Plan of Care (Signed)
  Problem: Clinical Measurements: Goal: Ability to maintain clinical measurements within normal limits will improve Outcome: Progressing Goal: Will remain free from infection Outcome: Progressing Goal: Cardiovascular complication will be avoided Outcome: Progressing   Problem: Nutrition: Goal: Adequate nutrition will be maintained Outcome: Progressing   Problem: Safety: Goal: Ability to remain free from injury will improve Outcome: Progressing

## 2019-03-19 NOTE — Progress Notes (Signed)
PROGRESS NOTE    [name] [name]  WGN:[id number] DOB: [birth year] DOA: 03/16/2019 PCP: Leanna Battles, MD    Brief Narrative:   [name] [name] is a 85 y.o. male with medical history significant for dementia, hyperlipidemia, GERD, depression, anxiety, recent right hip fracture status post surgery in mid June, presents after a fall from a chair on 03/08/2019.  History obtained from chart review as patient has dementia.  X-ray at the facility showed left hip fracture.  Patient has been complaining of ongoing left hip pain since the fall.  Brought in from the SNF.  ED Course: Patient's vitals remained stable, labs showed mild hyponatremia and normocytic anemia. Patient is COVID negative.  Repeat x-ray of the hip showed acute fracture of the proximal left femur with significant interval displacement.  Dr. Erlinda Hong from orthopedics was consulted, plan for surgery on 03/17/2019  Assessment & Plan:   Principal Problem:   Closed left hip fracture [facility]) Active Problems:   Fracture dislocation of left hip joint ([place])   Left proximal femur fracture Patient presenting from SNF following recent fall on 03/08/2019 with persistent left hip pain.  X-ray notable for left proximal femur fracture. --s/p left hip hemiarthroplasty on 03/17/2019 by Dr. Erlinda Hong --Norco 5-325 mg q4h prn for pain control; morphine 0.5 mg IV q4h prn breakthrough pain --Robaxin 500 mg p.o. every 8 hours as needed for muscle spasms --PT/OT eval following surgery --Continue Lovenox for DVT prophylaxis --Likely plan to return to [facility] and rehab; case management aware --Will need follow-up with orthopedics 2 weeks for wound check and staple removal  Hyponatremia Etiology likely secondary to hypovolemic hyponatremia from dehydration. --Na 131-->133 --Continue gentle IV fluid hydration --Continue to monitor sodium level daily  Dementia --Continue trazodone 50 mg p.o. nightly --Fall precautions   DVT prophylaxis: SCDs for  now, Lovenox postop per orthopedics Code Status: Full code Family Communication: None Disposition Plan: Continue inpatient, likely to return to [facility] and rehab, CM for coordination   Consultants:   Orthopedics, Dr. Erlinda Hong  Procedures:   Left hip hemiarthroplasty on 03/17/2019  Antimicrobials:   Perioperative cefazolin   Subjective: Patient seen and examined at bedside, pleasantly confused.  Pain controlled. No other complaints at this time.  Waiting for return to SNF.  Denies headache, no chest pain, no palpitations, no shortness of breath, no abdominal pain.  No acute events overnight per nurse staff.  Objective: Vitals:   03/18/19 1907 03/19/19 0315 03/19/19 0903 03/19/19 0943  BP: (!) 109/57 116/61 (!) 85/52 116/60  Pulse: 96 89 85 96  Resp: 15  16   Temp: 98.1 F (36.7 C) (!) 97.4 F (36.3 C) 98 F (36.7 C)   TempSrc: Oral Oral Oral   SpO2: (!) 89% 100% 100%   Weight:      Height:        Intake/Output Summary (Last 24 hours) at 03/19/2019 1157 Last data filed at 03/19/2019 0900 Gross per 24 hour  Intake 1076 ml  Output 650 ml  Net 426 ml   Filed Weights   03/16/19 1624 03/17/19 1030  Weight: 65.1 kg 65.1 kg    Examination:  General exam: Appears calm and comfortable, pleasantly confused Respiratory system: Clear to auscultation. Respiratory effort normal. Cardiovascular system: S1 & S2 heard, RRR. No JVD, murmurs, rubs, gallops or clicks. No pedal edema. Gastrointestinal system: Abdomen is nondistended, soft and nontender. No organomegaly or masses felt. Normal bowel sounds heard. Central nervous system: Alert. No focal neurological deficits. Extremities: Symmetric  5 x 5 power.  Moving all extremities independently. Skin: No rashes, lesions or ulcers Psychiatry: Pleasantly confused, does not follow commands    Data Reviewed: I have personally reviewed following labs and imaging studies  CBC: Recent Labs  Lab 03/16/19 0415 03/17/19 0234  03/18/19 0255  WBC 5.5 4.3 6.6  NEUTROABS 3.5  --   --   HGB 10.8* 9.9* 9.4*  HCT 33.4* 30.0* 29.2*  MCV 91.3 88.0 90.1  PLT 428* 432* 179*   Basic Metabolic Panel: Recent Labs  Lab 03/16/19 0415 03/17/19 0234 03/18/19 0255  NA 131* 133* 132*  K 4.5 4.6 4.6  CL 96* 99 97*  CO2 28 26 27   GLUCOSE 112* 97 130*  BUN 28* 22 16  CREATININE 0.91 0.81 0.84  CALCIUM 8.5* 8.4* 8.4*  MG  --   --  1.7   GFR: Estimated Creatinine Clearance: 59.2 mL/min (by C-G formula based on SCr of 0.84 mg/dL). Liver Function Tests: No results for input(s): AST, ALT, ALKPHOS, BILITOT, PROT, ALBUMIN in the last 168 hours. No results for input(s): LIPASE, AMYLASE in the last 168 hours. No results for input(s): AMMONIA in the last 168 hours. Coagulation Profile: Recent Labs  Lab 03/16/19 0605  INR 1.0   Cardiac Enzymes: No results for input(s): CKTOTAL, CKMB, CKMBINDEX, TROPONINI in the last 168 hours. BNP (last 3 results) No results for input(s): PROBNP in the last 8760 hours. HbA1C: No results for input(s): HGBA1C in the last 72 hours. CBG: No results for input(s): GLUCAP in the last 168 hours. Lipid Profile: No results for input(s): CHOL, HDL, LDLCALC, TRIG, CHOLHDL, LDLDIRECT in the last 72 hours. Thyroid Function Tests: No results for input(s): TSH, T4TOTAL, FREET4, T3FREE, THYROIDAB in the last 72 hours. Anemia Panel: Recent Labs    03/17/19 0234  VITAMINB12 284  FOLATE 12.9  FERRITIN 273  TIBC 209*  IRON 35*   Sepsis Labs: No results for input(s): PROCALCITON, LATICACIDVEN in the last 168 hours.  Recent Results (from the past 240 hour(s))  SARS Coronavirus 2 (CEPHEID - Performed in [place] hospital lab), Hosp Order     Status: None   Collection Time: 03/16/19  4:15 AM   Specimen: Nasopharyngeal Swab  Result Value Ref Range Status   SARS Coronavirus 2 NEGATIVE NEGATIVE Final    Comment: (NOTE) If result is NEGATIVE SARS-CoV-2 target nucleic acids are NOT DETECTED.  The SARS-CoV-2 RNA is generally detectable in upper and lower  respiratory specimens during the acute phase of infection. The lowest  concentration of SARS-CoV-2 viral copies this assay can detect is 250  copies / mL. A negative result does not preclude SARS-CoV-2 infection  and should not be used as the sole basis for treatment or other  patient management decisions.  A negative result may occur with  improper specimen collection / handling, submission of specimen other  than nasopharyngeal swab, presence of viral mutation(s) within the  areas targeted by this assay, and inadequate number of viral copies  (<250 copies / mL). A negative result must be combined with clinical  observations, patient history, and epidemiological information. If result is POSITIVE SARS-CoV-2 target nucleic acids are DETECTED. The SARS-CoV-2 RNA is generally detectable in upper and lower  respiratory specimens dur ing the acute phase of infection.  Positive  results are indicative of active infection with SARS-CoV-2.  Clinical  correlation with patient history and other diagnostic information is  necessary to determine patient infection status.  Positive results do  not  rule out bacterial infection or co-infection with other viruses. If result is PRESUMPTIVE POSTIVE SARS-CoV-2 nucleic acids MAY BE PRESENT.   A presumptive positive result was obtained on the submitted specimen  and confirmed on repeat testing.  While 2019 novel coronavirus  (SARS-CoV-2) nucleic acids may be present in the submitted sample  additional confirmatory testing may be necessary for epidemiological  and / or clinical management purposes  to differentiate between  SARS-CoV-2 and other Sarbecovirus currently known to infect humans.  If clinically indicated additional testing with an alternate test  methodology [phone]) is advised. The SARS-CoV-2 RNA is generally  detectable in upper and lower respiratory sp ecimens during the acute   phase of infection. The expected result is Negative. Fact Sheet for Patients:  StrictlyIdeas.no Fact Sheet for Healthcare Providers: BankingDealers.co.za This test is not yet approved or cleared by the [place] FDA and has been authorized for detection and/or diagnosis of SARS-CoV-2 by FDA under an Emergency Use Authorization (EUA).  This EUA will remain in effect (meaning this test can be used) for the duration of the COVID-19 declaration under Section 564(b)(1) of the Act, 21 U.S.C. section 360bbb-3(b)(1), unless the authorization is terminated or revoked sooner. Performed at [facility], [place] [address] [place], [place] [address]   MRSA PCR Screening     Status: Abnormal   Collection Time: 03/16/19  7:11 PM   Specimen: Nasal Mucosa; Nasopharyngeal  Result Value Ref Range Status   MRSA by PCR POSITIVE (A) NEGATIVE Final    Comment:        The GeneXpert MRSA Assay (FDA approved for NASAL specimens only), is one component of a comprehensive MRSA colonization surveillance program. It is not intended to diagnose MRSA infection nor to guide or monitor treatment for MRSA infections. RESULT CALLED TO, READ BACK BY AND VERIFIED WITH: Saul Fordyce RN 03/16/19 2112 JDW Performed at [facility], [place] [address], [place], [place] [address]          Radiology Studies: Pelvis Portable  Result Date: 03/17/2019 CLINICAL DATA:  Hip replacement.  Pain. EXAM: PORTABLE PELVIS 1-2 VIEWS COMPARISON:  None. FINDINGS: The patient is status post prior total hip arthroplasty on the left. The alignment appears near anatomic. The patient is status post prior percutaneous pinning of the right hip. There are advanced degenerative changes of the right hip. There is no acute displaced fracture. No dislocation. IMPRESSION: No acute osseous abnormality. Electronically Signed   By: Constance Holster M.D.   On: 03/17/2019 14:58   Dg  C-arm 1-60 Min  Result Date: 03/17/2019 CLINICAL DATA:  LEFT anterior approach hemi hip arthroplasty for LEFT hip fracture. EXAM: OPERATIVE LEFT HIP (WITH PELVIS IF PERFORMED) to VIEWS TECHNIQUE: Fluoroscopic spot image(s) were submitted for interpretation post-operatively. COMPARISON:  None. FINDINGS: Intraoperative fluoroscopic images of the LEFT hip showing arthroplasty hardware. Hardware appears appropriately positioned. Fluoroscopy provided for 16 seconds. IMPRESSION: Intraoperative fluoroscopic images showing LEFT hip arthroplasty hardware. No evidence of surgical complicating feature. Electronically Signed   By: Franki Cabot M.D.   On: 03/17/2019 13:14   Dg Hip Operative Unilat W Or W/o Pelvis Left  Result Date: 03/17/2019 CLINICAL DATA:  LEFT anterior approach hemi hip arthroplasty for LEFT hip fracture. EXAM: OPERATIVE LEFT HIP (WITH PELVIS IF PERFORMED) to VIEWS TECHNIQUE: Fluoroscopic spot image(s) were submitted for interpretation post-operatively. COMPARISON:  None. FINDINGS: Intraoperative fluoroscopic images of the LEFT hip showing arthroplasty hardware. Hardware appears appropriately positioned. Fluoroscopy provided for 16 seconds. IMPRESSION:  Intraoperative fluoroscopic images showing LEFT hip arthroplasty hardware. No evidence of surgical complicating feature. Electronically Signed   By: Franki Cabot M.D.   On: 03/17/2019 13:14        Scheduled Meds: . Chlorhexidine Gluconate Cloth  6 each Topical Q0600  . docusate sodium  100 mg Oral BID  . enoxaparin (LOVENOX) injection  40 mg Subcutaneous Q24H  . hydrocortisone cream  1 application Topical BID  . mupirocin ointment  1 application Nasal BID  . nystatin cream  1 application Topical BID  . traZODone  50 mg Oral QHS   Continuous Infusions: . lactated ringers    . methocarbamol (ROBAXIN) IV       LOS: 3 days    Time spent: 30 minutes    Ricardo Kayes J [place] DO [facility] Pager [phone]  If 7PM-7AM,  please contact night-coverage [email or web address] Password [facility] 03/19/2019, 11:57 AM

## 2019-03-19 NOTE — NC FL2 (Signed)
[place] LEVEL OF CARE SCREENING TOOL     IDENTIFICATION  Patient Name: [name] [name] Birthdate: 04-10-1931 Sex: male Admission Date (Current Location): 03/16/2019  [facility] and [place] Number:  Herbalist and Address:  [facility] [place], [place] [address] [place], [place] [address]      Provider Number: [id number]  Attending Physician Name and Address:  [place] Eric J, DO  Relative Name and Phone Number:  Maeola Sarah, [phone]    Current Level of Care: Hospital Recommended Level of Care: [place] Prior Approval Number:    Date Approved/Denied:   PASRR Number: [id number]  Discharge Plan: SNF    Current Diagnoses: Patient Active Problem List   Diagnosis Date Noted  . Closed left hip fracture ([place]) 03/16/2019  . Fracture dislocation of left hip joint ([place]) 03/16/2019  . Closed displaced fracture of right femoral neck ([place]) 02/19/2019  . Hip fracture ([place]) 02/19/2019  . Closed right hip fracture ([place]) 02/19/2019  . Evaluation by psychiatric service required   . Hypoxia 02/13/2018  . Edema 02/13/2018  . Hypoxemia 02/13/2018  . Dementia ([place]) 02/13/2018  . Altered mental status 02/13/2018  . Dermatitis 10/08/2015  . CHEST PAIN, ATYPICAL 04/11/2010  . Personal history of colon polyps and family history of colon cancer 12/19/2007  . Anxiety state 12/19/2007  . DEPRESSION 12/19/2007  . Bicipital tendinitis, left shoulder 12/19/2007  . ALLERGIC CONJUNCTIVITIS 12/19/2007  . Seasonal allergic rhinitis 12/19/2007  . GERD 12/19/2007  . IBS 12/19/2007    Orientation RESPIRATION BLADDER Height & Weight     (disorientated x4)  Normal Incontinent Weight: 143 lb 8.3 oz (65.1 kg) Height:  6\' 2"  (188 cm)  BEHAVIORAL SYMPTOMS/MOOD NEUROLOGICAL BOWEL NUTRITION STATUS      Incontinent Diet(Regular diet, needs assistance)  AMBULATORY STATUS COMMUNICATION OF NEEDS Skin   Limited Assist   Surgical wounds                        Personal Care Assistance Level of Assistance  Bathing, Feeding, Dressing, Total care Bathing Assistance: Maximum assistance Feeding assistance: Limited assistance Dressing Assistance: Maximum assistance Total Care Assistance: Limited assistance   Functional Limitations Info  Sight, Hearing, Speech Sight Info: Adequate Hearing Info: Adequate Speech Info: Adequate    SPECIAL CARE FACTORS FREQUENCY  PT (By licensed PT), OT (By licensed OT)     PT Frequency: 5x/wk OT Frequency: 5x/wk            Contractures Contractures Info: Not present    Additional Factors Info  Code Status, Allergies Code Status Info: Full Code Allergies Info: Codeine, Penicillins, Ranitidine, Amoxicillin           Current Medications (03/19/2019):  This is the current hospital active medication list Current Facility-Administered Medications  Medication Dose Route Frequency Provider Last Rate Last Dose  . acetaminophen (TYLENOL) tablet 650 mg  650 mg Oral Q6H PRN Leandrew Koyanagi, MD   650 mg at 03/17/19 2104   Or  . acetaminophen (TYLENOL) suppository 650 mg  650 mg Rectal Q6H PRN Leandrew Koyanagi, MD      . albuterol (PROVENTIL) (2.5 MG/3ML) 0.083% nebulizer solution 2.5 mg  2.5 mg Nebulization Q2H PRN Leandrew Koyanagi, MD      . alum & mag hydroxide-simeth (MAALOX/MYLANTA) 200-200-20 MG/5ML suspension 30 mL  30 mL Oral Q4H PRN Leandrew Koyanagi, MD      . Chlorhexidine Gluconate Cloth 2 %  PADS 6 each  6 each Topical Q0600 Leandrew Koyanagi, MD   6 each at 03/19/19 0600  . docusate sodium (COLACE) capsule 100 mg  100 mg Oral BID Leandrew Koyanagi, MD   100 mg at 03/19/19 9798  . enoxaparin (LOVENOX) injection 40 mg  40 mg Subcutaneous Q24H Leandrew Koyanagi, MD   40 mg at 03/19/19 9211  . HYDROcodone-acetaminophen (NORCO) 7.5-325 MG per tablet 1-2 tablet  1-2 tablet Oral Q4H PRN Leandrew Koyanagi, MD   1 tablet at 03/19/19 0941  . HYDROcodone-acetaminophen (NORCO/VICODIN) 5-325 MG per tablet 1-2 tablet   1-2 tablet Oral Q4H PRN Leandrew Koyanagi, MD   1 tablet at 03/18/19 0906  . hydrocortisone cream 1 % 1 application  1 application Topical BID Leandrew Koyanagi, MD   1 application at 94/17/40 0940  . lactated ringers infusion   Intravenous Continuous Leandrew Koyanagi, MD      . LORazepam (ATIVAN) tablet 0.5 mg  0.5 mg Oral Q12H PRN Leandrew Koyanagi, MD   0.5 mg at 03/18/19 1728  . magnesium citrate solution 1 Bottle  1 Bottle Oral Once PRN Leandrew Koyanagi, MD      . menthol-cetylpyridinium (CEPACOL) lozenge 3 mg  1 lozenge Oral PRN Leandrew Koyanagi, MD       Or  . phenol (CHLORASEPTIC) mouth spray 1 spray  1 spray Mouth/Throat PRN Leandrew Koyanagi, MD      . methocarbamol (ROBAXIN) tablet 500 mg  500 mg Oral Q6H PRN Leandrew Koyanagi, MD   500 mg at 03/19/19 0941   Or  . methocarbamol (ROBAXIN) 500 mg in dextrose 5 % 50 mL IVPB  500 mg Intravenous Q6H PRN Leandrew Koyanagi, MD      . morphine 2 MG/ML injection 1 mg  1 mg Intravenous Q2H PRN Leandrew Koyanagi, MD      . mupirocin ointment (BACTROBAN) 2 % 1 application  1 application Nasal BID Leandrew Koyanagi, MD   1 application at 81/44/81 0940  . nystatin cream (MYCOSTATIN) 1 application  1 application Topical BID Leandrew Koyanagi, MD   1 application at 85/63/14 0940  . ondansetron (ZOFRAN) tablet 4 mg  4 mg Oral Q6H PRN Leandrew Koyanagi, MD       Or  . ondansetron [facility]) injection 4 mg  4 mg Intravenous Q6H PRN Leandrew Koyanagi, MD      . oxyCODONE-acetaminophen (PERCOCET/ROXICET) 5-325 MG per tablet 1 tablet  1 tablet Oral Q4H PRN Leandrew Koyanagi, MD   1 tablet at 03/16/19 0659  . polyethylene glycol (MIRALAX / GLYCOLAX) packet 17 g  17 g Oral Daily PRN Leandrew Koyanagi, MD   17 g at 03/19/19 9702  . sorbitol 70 % solution 30 mL  30 mL Oral Daily PRN Leandrew Koyanagi, MD      . traZODone (DESYREL) tablet 50 mg  50 mg Oral QHS Leandrew Koyanagi, MD   50 mg at 03/18/19 2331     Discharge Medications: Please see discharge summary for a list of discharge medications.  Relevant Imaging  Results:  Relevant Lab Results:   Additional Information SSN: [id number]  Philippa Chester Karolyna Bianchini, LCSWA

## 2019-03-19 NOTE — TOC Initial Note (Signed)
Transition of Care [facility]) - Initial/Assessment Note    Patient Details  Name: [name] [name] MRN: [id number] Date of Birth: 02-Feb-1931  [facility] CM/SW Contact:    Gelene Mink, [place] Phone Number: 03/19/2019, 4:32 PM  Clinical Narrative:                  CSW called and spoke with the patient's HCPOA, Mathis Bud. CSW introduced herself and explained her role. CSW asked if he would like the patient to return back to [facility]. Aaron Edelman asked if there were other SNF options, CSW stated yes there were. CSW obtained [name]'s email address to send the CMS SNF list. He stated that his only concern was that the patient had fallen at [facility] and Aaron Edelman was wondering if he could hire a private pay sitter to sit in the room with him. CSW stated that with COVID going on, facilities would not allow outside visitors and CSW explained that the facilities did not have enough nursing staff to provide 1 on 1 care. CSW asked if the patient could not get into another facility, would [facility] be okay, he stated that it would be. CSW asked if he could fax the patient out to other facilities. He stated that he wanted to look over the list and call with options.   CSW will continue to follow and assist.   Expected Discharge Plan: Skilled Nursing Facility Barriers to Discharge: Continued Medical Work up   Patient Goals and CMS Choice Patient states their goals for this hospitalization and ongoing recovery are:: Pt HCPOA would like the patient to return to SNF to complete rehab CMS Medicare.gov Compare Post Acute Care list provided to:: Legal Guardian Choice offered to / list presented to : [place] / Guardian  Expected Discharge Plan and Services Expected Discharge Plan: [place] In-house Referral: Clinical Social Work Discharge Planning Services: NA Post Acute Care Choice: [place] Living arrangements for the past 2 months:  [facility], Assisted Living Facility(From [facility] memory care, has been at [facility] receiving SNF) Expected Discharge Date: 03/20/19               DME Arranged: N/A DME Agency: NA       HH Arranged: NA [place] Agency: NA        Prior Living Arrangements/Services Living arrangements for the past 2 months: [facility], Assisted Living Facility(From [facility] memory care, has been at [facility] receiving SNF) Lives with:: Facility Resident Patient language and need for interpreter reviewed:: No Do you feel safe going back to the place where you live?: Yes      Need for Family Participation in Patient Care: Yes (Comment) Care giver support system in place?: Yes (comment)   Criminal Activity/Legal Involvement Pertinent to Current Situation/Hospitalization: No - Comment as needed  Activities of Daily Living Home Assistive Devices/Equipment: Wheelchair ADL Screening (condition at time of admission) Patient's cognitive ability adequate to safely complete daily activities?: No Is the patient deaf or have difficulty hearing?: No Does the patient have difficulty seeing, even when wearing glasses/contacts?: No Does the patient have difficulty concentrating, remembering, or making decisions?: Yes Patient able to express need for assistance with ADLs?: No Does the patient have difficulty dressing or bathing?: Yes Independently performs ADLs?: No Communication: Needs assistance Dressing (OT): Dependent, Needs assistance Grooming: Dependent Feeding: Independent Bathing: Dependent Toileting: Needs assistance In/Out Bed: Dependent Does the patient have difficulty walking or climbing stairs?:  Yes Weakness of Legs: Both Weakness of Arms/Hands: None  Permission Sought/Granted Permission sought to share information with : Case Manager Permission granted to share information with : Yes, Verbal Permission Granted  Share Information with NAME: Mathis Bud  Permission granted  to share info w AGENCY: All SNF  Permission granted to share info w Relationship: HCPOA  Permission granted to share info w Contact Information: [phone]  Emotional Assessment Appearance:: Appears stated age Attitude/Demeanor/Rapport: Unable to Assess Affect (typically observed): Unable to Assess Orientation: : Fluctuating Orientation (Suspected and/or reported [place]) Alcohol / Substance Use: Not Applicable Psych Involvement: No (comment)  Admission diagnosis:  Closed fracture of left hip, initial encounter ([place]) [S72.002A] Fracture dislocation of left hip joint ([place]) [S72.002A] Patient Active Problem List   Diagnosis Date Noted  . Closed left hip fracture ([place]) 03/16/2019  . Fracture dislocation of left hip joint ([place]) 03/16/2019  . Closed displaced fracture of right femoral neck ([place]) 02/19/2019  . Hip fracture ([place]) 02/19/2019  . Closed right hip fracture ([place]) 02/19/2019  . Evaluation by psychiatric service required   . Hypoxia 02/13/2018  . Edema 02/13/2018  . Hypoxemia 02/13/2018  . Dementia ([place]) 02/13/2018  . Altered mental status 02/13/2018  . Dermatitis 10/08/2015  . CHEST PAIN, ATYPICAL 04/11/2010  . Personal history of colon polyps and family history of colon cancer 12/19/2007  . Anxiety state 12/19/2007  . DEPRESSION 12/19/2007  . Bicipital tendinitis, left shoulder 12/19/2007  . ALLERGIC CONJUNCTIVITIS 12/19/2007  . Seasonal allergic rhinitis 12/19/2007  . GERD 12/19/2007  . IBS 12/19/2007   PCP:  Leanna Battles, MD Pharmacy:   [facility] [phone] - [place], [place] - [name] AT [place] [name] [place] [address] Phone: [phone] Fax: [phone]  [id number] - [address], [place] - [place] [address] Phone: [phone] Fax: [phone]     Social Determinants of Health (SDOH) Interventions    Readmission Risk Interventions No  flowsheet data found.

## 2019-03-19 NOTE — Progress Notes (Signed)
Pt put in low bed with fall mats for increased safety. No longer has Air cabin crew. Pt sat up in chair this am for 3 hours without attempting to get up. Did remove condom cath, but did not urinate on himself. Alert only to self, he does attempt to have conversations with staff, but they are rarely understood sentences. I attempted to set pt up for lunch, but he became irritable and acted like he wanted to continue to nap. I turned the light back off and let him rest. Pt is currently on his right side, and he turned himself that way. Call bell in reach, bed alarm on fall mats in place and bed in lowest position. Continue to monitor.

## 2019-03-19 NOTE — Plan of Care (Signed)

## 2019-03-20 ENCOUNTER — Encounter (HOSPITAL_COMMUNITY): Payer: Self-pay | Admitting: Orthopaedic Surgery

## 2019-03-20 DIAGNOSIS — F0280 Dementia in other diseases classified elsewhere without behavioral disturbance: Secondary | ICD-10-CM

## 2019-03-20 DIAGNOSIS — G20 Parkinson's disease: Secondary | ICD-10-CM

## 2019-03-20 MED ORDER — ENSURE ENLIVE PO LIQD
237.0000 mL | Freq: Two times a day (BID) | ORAL | Status: DC
  Administered 2019-03-20 – 2019-03-22 (×4): 237 mL via ORAL

## 2019-03-20 NOTE — Plan of Care (Signed)
Palliative Care  Patient has been identified as high risk for 12 month mortality based on a gero-pal predictive analytics tool. If you feel a palliative care consult is appropriate please consider placing a referral in Epic for either inpatient consultation if there are symptom management or goals of care needs or for ambulatory palliative care at SNF at the time of discharge. Thank you for caring for this patient. Please contact us for any additional support needed.  Lane Hacker, DO Palliative Medicine [phone]

## 2019-03-20 NOTE — Progress Notes (Signed)
Physical Therapy Treatment Patient Details Name: [name] [name] MRN: [id number] DOB: 1930-11-11 Today's Date: 03/20/2019    History of Present Illness Pt is a 85 y/o male with medical history significant for dementia, depression, anxiety, recent right hip fracture status post surgery in mid June, presents after a fall from a chair on 03/08/2019. X-ray reveals L proximal femur fx withsignificant interval displacement. S/P hemiarthroplasty (anterior approach) 7/10.  Post op WBAT, NO precautions.     PT Comments    Pt sleeping in bed upon arrival. RN reports she just gave pt pain meds. Increased time to arouse pt for participation in therapy. He required mod assist bed mobility, mod assist sit to stand, and min/mod assist ambulation 40 feet with RW, +2 for chair follow. Pt progressing well with mobility. He presents as a high risk for falls due to cognition. Pt positioned in recliner with feet elevated at end of session.    Follow Up Recommendations  SNF;Supervision/Assistance - 24 hour     Equipment Recommendations  Other (comment)(defer)    Recommendations for Other Services       Precautions / Restrictions Precautions Precautions: Fall Restrictions Weight Bearing Restrictions: No LLE Weight Bearing: Weight bearing as tolerated    Mobility  Bed Mobility Overal bed mobility: Needs Assistance Bed Mobility: Supine to Sit     Supine to sit: HOB elevated;Mod assist     General bed mobility comments: assist to initate movement of LEs towards EOB and support trunk  Transfers Overall transfer level: Needs assistance Equipment used: Rolling walker (2 wheeled) Transfers: Sit to/from Stand Sit to Stand: Mod assist;+2 safety/equipment         General transfer comment: assist to power up, multimodal cues for sequencing  Ambulation/Gait Ambulation/Gait assistance: +2 safety/equipment;Min assist;Mod assist Gait Distance (Feet): 40 Feet Assistive device: Rolling walker (2  wheeled) Gait Pattern/deviations: Antalgic;Step-through pattern;Decreased stride length;Trunk flexed Gait velocity: Decreased   General Gait Details: cues/assist to stay close to RW, flexed posture (trunk and BLE), increased assist to maintain stance/balance as pt fatigued. +2 assist for chair follow. Increased time to complete stand > sit in recliner following walk due to cognitive status.   Stairs             Wheelchair Mobility    Modified Rankin (Stroke Patients Only)       Balance Overall balance assessment: Needs assistance Sitting-balance support: No upper extremity supported;Feet supported Sitting balance-Leahy Scale: Fair     Standing balance support: Bilateral upper extremity supported;During functional activity Standing balance-Leahy Scale: Poor Standing balance comment: relaint on B UE support                            Cognition Arousal/Alertness: Awake/alert Behavior During Therapy: Flat affect Overall Cognitive Status: History of cognitive impairments - at baseline                                 General Comments: h/o dementia, follows automatic commands with multimodal cueing       Exercises      General Comments General comments (skin integrity, edema, etc.): Max multimodal cues to stay on task.      Pertinent Vitals/Pain Pain Assessment: Faces Faces Pain Scale: Hurts even more Pain Location: R hip, increased with mobility Pain Descriptors / Indicators: Grimacing;Guarding;Discomfort Pain Intervention(s): Monitored during session;Repositioned;Premedicated before session    Home Living  Prior Function            PT Goals (current goals can now be found in the care plan section) Acute Rehab PT Goals Patient Stated Goal: None stated PT Goal Formulation: Patient unable to participate in goal setting Time For Goal Achievement: 04/01/19 Potential to Achieve Goals: Fair Progress towards  PT goals: Progressing toward goals    Frequency    Min 3X/week      PT Plan Current plan remains appropriate    Co-evaluation              AM-PAC PT "6 Clicks" Mobility   Outcome Measure  Help needed turning from your back to your side while in a flat bed without using bedrails?: A Little Help needed moving from lying on your back to sitting on the side of a flat bed without using bedrails?: A Lot Help needed moving to and from a bed to a chair (including a wheelchair)?: A Lot Help needed standing up from a chair using your arms (e.g., wheelchair or bedside chair)?: A Little Help needed to walk in hospital room?: A Lot Help needed climbing 3-5 steps with a railing? : A Lot 6 Click Score: 14    End of Session Equipment Utilized During Treatment: Gait belt Activity Tolerance: Patient tolerated treatment well Patient left: in chair;with chair alarm set;with call bell/phone within reach Nurse Communication: Mobility status PT Visit Diagnosis: Other abnormalities of gait and mobility (R26.89);Pain Pain - Right/Left: Left Pain - part of body: Hip     Time: 1022-1037 PT Time Calculation (min) (ACUTE ONLY): 15 min  Charges:  $Gait Training: 8-22 mins                     Lorrin Goodell, PT  Office # [phone] Pager [phone]    [name] [name] 03/20/2019, 11:46 AM

## 2019-03-20 NOTE — Progress Notes (Signed)
PROGRESS NOTE    Vidur Rontavious Albright  JIR:[id number] DOB: [birth year] DOA: 03/16/2019 PCP: Leanna Battles, MD    Brief Narrative:   [name] [name] is a 85 y.o. male with medical history significant for dementia, hyperlipidemia, GERD, depression, anxiety, recent right hip fracture status post surgery in mid June, presents after a fall from a chair on 03/08/2019.  History obtained from chart review as patient has dementia.  X-ray at the facility showed left hip fracture.  Patient has been complaining of ongoing left hip pain since the fall.  Brought in from the SNF.  ED Course: Patient's vitals remained stable, labs showed mild hyponatremia and normocytic anemia. Patient is COVID negative.  Repeat x-ray of the hip showed acute fracture of the proximal left femur with significant interval displacement.  Dr. Erlinda Hong from orthopedics was consulted, plan for surgery on 03/17/2019  Assessment & Plan:   Principal Problem:   Closed left hip fracture [facility]) Active Problems:   Fracture dislocation of left hip joint ([place])   Left proximal femur fracture Patient presenting from SNF following recent fall on 03/08/2019 with persistent left hip pain.  X-ray notable for left proximal femur fracture. --s/p left hip hemiarthroplasty on 03/17/2019 by Dr. Erlinda Hong --Norco 5-325 mg q4h prn for pain control; morphine 0.5 mg IV q4h prn breakthrough pain --Robaxin 500 mg p.o. every 8 hours as needed for muscle spasms --Continue PT/OT efforts --Continue Lovenox for DVT prophylaxis --Likely plan to return to [place] and rehab; case management aware --Will need follow-up with orthopedics 2 weeks for wound check and staple removal  Hyponatremia Etiology likely secondary to hypovolemic hyponatremia from dehydration. --Na 131-->133 --Continue to monitor sodium level daily  Dementia --Continue trazodone 50 mg p.o. nightly --Fall precautions   DVT prophylaxis: SCDs for now, Lovenox postop per orthopedics Code  Status: Full code Family Communication: None Disposition Plan: Continue inpatient, likely to return to [place] and rehab, CM for coordination   Consultants:   Orthopedics, Dr. Erlinda Hong  Procedures:   Left hip hemiarthroplasty on 03/17/2019  Antimicrobials:   Perioperative cefazolin   Subjective: Patient seen and examined at bedside, pleasantly confused.  Pain controlled. No other complaints at this time.  Waiting for return to SNF.  Denies headache, no chest pain, no palpitations, no shortness of breath, no abdominal pain.  No acute events overnight per nurse staff.  Objective: Vitals:   03/19/19 1531 03/19/19 1951 03/20/19 0439 03/20/19 0740  BP: (!) 105/53 (!) 100/45 124/77 127/85  Pulse: 93 90 86 95  Resp: 14   16  Temp: 98 F (36.7 C) 98.2 F (36.8 C) 97.7 F (36.5 C) 98.4 F (36.9 C)  TempSrc: Oral Axillary Axillary Oral  SpO2: 97% 97% 96% 100%  Weight:      Height:        Intake/Output Summary (Last 24 hours) at 03/20/2019 1407 Last data filed at 03/20/2019 0745 Gross per 24 hour  Intake -  Output 1500 ml  Net -1500 ml   Filed Weights   03/16/19 1624 03/17/19 1030  Weight: 65.1 kg 65.1 kg    Examination:  General exam: Appears calm and comfortable, pleasantly confused Respiratory system: Clear to auscultation. Respiratory effort normal. Cardiovascular system: S1 & S2 heard, RRR. No JVD, murmurs, rubs, gallops or clicks. No pedal edema. Gastrointestinal system: Abdomen is nondistended, soft and nontender. No organomegaly or masses felt. Normal bowel sounds heard. Central nervous system: Alert. No focal neurological deficits. Extremities: Symmetric 5 x 5 power.  Moving  all extremities independently. Skin: No rashes, lesions or ulcers Psychiatry: Pleasantly confused, does not follow commands    Data Reviewed: I have personally reviewed following labs and imaging studies  CBC: Recent Labs  Lab 03/16/19 0415 03/17/19 0234 03/18/19 0255  WBC 5.5  4.3 6.6  NEUTROABS 3.5  --   --   HGB 10.8* 9.9* 9.4*  HCT 33.4* 30.0* 29.2*  MCV 91.3 88.0 90.1  PLT 428* 432* 580*   Basic Metabolic Panel: Recent Labs  Lab 03/16/19 0415 03/17/19 0234 03/18/19 0255  NA 131* 133* 132*  K 4.5 4.6 4.6  CL 96* 99 97*  CO2 28 26 27   GLUCOSE 112* 97 130*  BUN 28* 22 16  CREATININE 0.91 0.81 0.84  CALCIUM 8.5* 8.4* 8.4*  MG  --   --  1.7   GFR: Estimated Creatinine Clearance: 59.2 mL/min (by C-G formula based on SCr of 0.84 mg/dL). Liver Function Tests: No results for input(s): AST, ALT, ALKPHOS, BILITOT, PROT, ALBUMIN in the last 168 hours. No results for input(s): LIPASE, AMYLASE in the last 168 hours. No results for input(s): AMMONIA in the last 168 hours. Coagulation Profile: Recent Labs  Lab 03/16/19 0605  INR 1.0   Cardiac Enzymes: No results for input(s): CKTOTAL, CKMB, CKMBINDEX, TROPONINI in the last 168 hours. BNP (last 3 results) No results for input(s): PROBNP in the last 8760 hours. HbA1C: No results for input(s): HGBA1C in the last 72 hours. CBG: No results for input(s): GLUCAP in the last 168 hours. Lipid Profile: No results for input(s): CHOL, HDL, LDLCALC, TRIG, CHOLHDL, LDLDIRECT in the last 72 hours. Thyroid Function Tests: No results for input(s): TSH, T4TOTAL, FREET4, T3FREE, THYROIDAB in the last 72 hours. Anemia Panel: No results for input(s): VITAMINB12, FOLATE, FERRITIN, TIBC, IRON, RETICCTPCT in the last 72 hours. Sepsis Labs: No results for input(s): PROCALCITON, LATICACIDVEN in the last 168 hours.  Recent Results (from the past 240 hour(s))  SARS Coronavirus 2 (CEPHEID - Performed in [place] hospital lab), Hosp Order     Status: None   Collection Time: 03/16/19  4:15 AM   Specimen: Nasopharyngeal Swab  Result Value Ref Range Status   SARS Coronavirus 2 NEGATIVE NEGATIVE Final    Comment: (NOTE) If result is NEGATIVE SARS-CoV-2 target nucleic acids are NOT DETECTED. The SARS-CoV-2 RNA is  generally detectable in upper and lower  respiratory specimens during the acute phase of infection. The lowest  concentration of SARS-CoV-2 viral copies this assay can detect is 250  copies / mL. A negative result does not preclude SARS-CoV-2 infection  and should not be used as the sole basis for treatment or other  patient management decisions.  A negative result may occur with  improper specimen collection / handling, submission of specimen other  than nasopharyngeal swab, presence of viral mutation(s) within the  areas targeted by this assay, and inadequate number of viral copies  (<250 copies / mL). A negative result must be combined with clinical  observations, patient history, and epidemiological information. If result is POSITIVE SARS-CoV-2 target nucleic acids are DETECTED. The SARS-CoV-2 RNA is generally detectable in upper and lower  respiratory specimens dur ing the acute phase of infection.  Positive  results are indicative of active infection with SARS-CoV-2.  Clinical  correlation with patient history and other diagnostic information is  necessary to determine patient infection status.  Positive results do  not rule out bacterial infection or co-infection with other viruses. If result is PRESUMPTIVE POSTIVE SARS-CoV-2  nucleic acids MAY BE PRESENT.   A presumptive positive result was obtained on the submitted specimen  and confirmed on repeat testing.  While 2019 novel coronavirus  (SARS-CoV-2) nucleic acids may be present in the submitted sample  additional confirmatory testing may be necessary for epidemiological  and / or clinical management purposes  to differentiate between  SARS-CoV-2 and other Sarbecovirus currently known to infect humans.  If clinically indicated additional testing with an alternate test  methodology [id number]) is advised. The SARS-CoV-2 RNA is generally  detectable in upper and lower respiratory sp ecimens during the acute  phase of infection.  The expected result is Negative. Fact Sheet for Patients:  StrictlyIdeas.no Fact Sheet for Healthcare Providers: BankingDealers.co.za This test is not yet approved or cleared by the [place] FDA and has been authorized for detection and/or diagnosis of SARS-CoV-2 by FDA under an Emergency Use Authorization (EUA).  This EUA will remain in effect (meaning this test can be used) for the duration of the COVID-19 declaration under Section 564(b)(1) of the Act, 21 U.S.C. section 360bbb-3(b)(1), unless the authorization is terminated or revoked sooner. Performed at [facility], [place] [address] [place], [place] [address]   MRSA PCR Screening     Status: Abnormal   Collection Time: 03/16/19  7:11 PM   Specimen: Nasal Mucosa; Nasopharyngeal  Result Value Ref Range Status   MRSA by PCR POSITIVE (A) NEGATIVE Final    Comment:        The GeneXpert MRSA Assay (FDA approved for NASAL specimens only), is one component of a comprehensive MRSA colonization surveillance program. It is not intended to diagnose MRSA infection nor to guide or monitor treatment for MRSA infections. RESULT CALLED TO, READ BACK BY AND VERIFIED WITH: Saul Fordyce RN 03/16/19 2112 JDW Performed at [facility], [place] [address] [place], [place] [address]          Radiology Studies: No results found.      Scheduled Meds: . Chlorhexidine Gluconate Cloth  6 each Topical Q0600  . docusate sodium  100 mg Oral BID  . enoxaparin (LOVENOX) injection  40 mg Subcutaneous Q24H  . hydrocortisone cream  1 application Topical BID  . mupirocin ointment  1 application Nasal BID  . nystatin cream  1 application Topical BID  . traZODone  50 mg Oral QHS   Continuous Infusions: . lactated ringers    . methocarbamol (ROBAXIN) IV       LOS: 4 days    Time spent: 30 minutes     J [place] DO [facility] Pager [phone]  If  7PM-7AM, please contact night-coverage [email or web address] Password [facility] 03/20/2019, 2:07 PM

## 2019-03-20 NOTE — Progress Notes (Signed)
Initial Nutrition Assessment  DOCUMENTATION CODES:   Underweight  INTERVENTION:  Provide Ensure Enlive po BID, each supplement provides 350 kcal and 20 grams of protein.  Encourage adequate PO intake.   NUTRITION DIAGNOSIS:   Increased nutrient needs related to post-op healing as evidenced by estimated needs.  GOAL:   Patient will meet greater than or equal to 90% of their needs  MONITOR:   PO intake, Supplement acceptance, Skin, Weight trends, Labs, I & O's  REASON FOR ASSESSMENT:   Malnutrition Screening Tool    ASSESSMENT:   85 y.o. male with medical history significant for dementia, hyperlipidemia, GERD, depression, anxiety, recent right hip fracture status post surgery in mid June, presents after a fall. X-ray showed left hip fracture.  Procedure (7/10): ANTERIOR APPROACH HEMI HIP ARTHROPLASTY (Left Hip)   RD unable to obtain pt nutrition history during attempted time of contact. Pt is currently on a heart healthy diet with meal completion of 75-100%. RD to order nutritional supplements to aid in healing.   Unable to complete Nutrition-Focused physical exam at this time.   Labs and medications reviewed.   Diet Order:   Diet Order            Diet Heart Room service appropriate? Yes; Fluid consistency: Thin  Diet effective now              EDUCATION NEEDS:   Not appropriate for education at this time  Skin:  Skin Assessment: Skin Integrity Issues: Skin Integrity Issues:: Incisions Incisions: L hip  Last BM:  7/12  Height:   Ht Readings from Last 1 Encounters:  03/17/19 6\' 2"  (1.88 m)    Weight:   Wt Readings from Last 1 Encounters:  03/17/19 65.1 kg    Ideal Body Weight:  86.4 kg  BMI:  Body mass index is 18.43 kg/m.  Estimated Nutritional Needs:   Kcal:  1800-2000  Protein:  90-100 grams  Fluid:  >/= 1.8 L/day    Corrin Parker, MS, RD, LDN Pager # [phone] After hours/ weekend pager # [phone]

## 2019-03-20 NOTE — Anesthesia Postprocedure Evaluation (Signed)
Anesthesia Post Note  Patient: [name] [name]  Procedure(s) Performed: ANTERIOR APPROACH HEMI HIP ARTHROPLASTY (Left Hip)     Patient location during evaluation: PACU Anesthesia Type: General Level of consciousness: awake and alert Pain management: pain level controlled Vital Signs Assessment: post-procedure vital signs reviewed and stable Respiratory status: spontaneous breathing, nonlabored ventilation and respiratory function stable Cardiovascular status: blood pressure returned to baseline and stable Postop Assessment: no apparent nausea or vomiting Anesthetic complications: no    Last Vitals:  Vitals:   03/20/19 0439 03/20/19 0740  BP: 124/77 127/85  Pulse: 86 95  Resp:  16  Temp: 36.5 C 36.9 C  SpO2: 96% 100%    Last Pain:  Vitals:   03/20/19 0740  TempSrc: Oral  PainSc:                  Lynda Rainwater

## 2019-03-21 LAB — BASIC METABOLIC PANEL
Anion gap: 8 (ref 5–15)
BUN: 13 mg/dL (ref 8–23)
CO2: 29 mmol/L (ref 22–32)
Calcium: 8.4 mg/dL — ABNORMAL LOW (ref 8.9–10.3)
Chloride: 94 mmol/L — ABNORMAL LOW (ref 98–111)
Creatinine, Ser: 0.65 mg/dL (ref 0.61–1.24)
GFR calc Af Amer: >60 mL/min (ref >60)
GFR calc non Af Amer: >60 mL/min (ref >60)
Glucose, Bld: 107 mg/dL — ABNORMAL HIGH (ref 70–99)
Potassium: 4.1 mmol/L (ref 3.5–5.1)
Sodium: 131 mmol/L — ABNORMAL LOW (ref 135–145)

## 2019-03-21 NOTE — Care Management Important Message (Signed)
Important Message  Patient Details  Name: [name] [name] Needs MRN: [id number] Date of Birth: 07-07-1931   Medicare Important Message Given:  Yes     Memory Argue 03/21/2019, 12:47 PM    PATIENT HAS SIGNED IM

## 2019-03-21 NOTE — Progress Notes (Signed)
PROGRESS NOTE    [name] [name]  OAC:[id number] DOB: 10-19-1930 DOA: 03/16/2019 PCP: Leanna Battles, MD    Brief Narrative:   [name] [name] is a 85 y.o. male with medical history significant for dementia, hyperlipidemia, GERD, depression, anxiety, recent right hip fracture status post surgery in mid June, presents after a fall from a chair on 03/08/2019.  History obtained from chart review as patient has dementia.  X-ray at the facility showed left hip fracture.  Patient has been complaining of ongoing left hip pain since the fall.  Brought in from the SNF.  ED Course: Patient's vitals remained stable, labs showed mild hyponatremia and normocytic anemia. Patient is COVID negative.  Repeat x-ray of the hip showed acute fracture of the proximal left femur with significant interval displacement.  Dr. Erlinda Hong from orthopedics was consulted, plan for surgery on 03/17/2019  Assessment & Plan:   Principal Problem:   Closed left hip fracture [facility]) Active Problems:   Dementia ([place])   Fracture dislocation of left hip joint ([place])   Left proximal femur fracture Patient presenting from SNF following recent fall on 03/08/2019 with persistent left hip pain.  X-ray notable for left proximal femur fracture. --s/p left hip hemiarthroplasty on 03/17/2019 by Dr. Erlinda Hong --Norco 5-325 mg q4h prn for pain control; morphine 0.5 mg IV q4h prn breakthrough pain --Robaxin 500 mg p.o. every 8 hours as needed for muscle spasms --Continue PT/OT efforts --Continue Lovenox for DVT prophylaxis --Likely plan to return to [place] and rehab; case management aware --Will need follow-up with orthopedics 2 weeks for wound check and staple removal  Hyponatremia Etiology likely secondary to hypovolemic hyponatremia from dehydration. --Na 131-->133 --Continue encourage increased oral intake  Dementia --Continue trazodone 50 mg p.o. nightly --Fall precautions   DVT prophylaxis: SCDs for now, Lovenox postop per  orthopedics Code Status: Full code Family Communication: None Disposition Plan: Continue inpatient, likely to return to [place] and rehab, CM for coordination   Consultants:   Orthopedics, Dr. Erlinda Hong  Procedures:   Left hip hemiarthroplasty on 03/17/2019  Antimicrobials:   Perioperative cefazolin   Subjective: Patient seen and examined at bedside, pleasantly confused.  Pain controlled. No other complaints at this time.  Waiting for return to SNF.  Denies headache, no chest pain, no palpitations, no shortness of breath, no abdominal pain.  No acute events overnight per nurse staff.  Objective: Vitals:   03/20/19 2047 03/21/19 0005 03/21/19 0326 03/21/19 0815  BP: 125/78  137/88 114/67  Pulse: 94  85 91  Resp: 16  16 17   Temp: 98.1 F (36.7 C)  98.3 F (36.8 C) 98.5 F (36.9 C)  TempSrc: Oral  Oral Oral  SpO2:  92% 90%   Weight:      Height:        Intake/Output Summary (Last 24 hours) at 03/21/2019 1340 Last data filed at 03/21/2019 0200 Gross per 24 hour  Intake 720 ml  Output 1600 ml  Net -880 ml   Filed Weights   03/16/19 1624 03/17/19 1030  Weight: 65.1 kg 65.1 kg    Examination:  General exam: Appears calm and comfortable, pleasantly confused Respiratory system: Clear to auscultation. Respiratory effort normal. Cardiovascular system: S1 & S2 heard, RRR. No JVD, murmurs, rubs, gallops or clicks. No pedal edema. Gastrointestinal system: Abdomen is nondistended, soft and nontender. No organomegaly or masses felt. Normal bowel sounds heard. Central nervous system: Alert. No focal neurological deficits. Extremities: Symmetric 5 x 5 power.  Moving all  extremities independently. Skin: No rashes, lesions or ulcers Psychiatry: Pleasantly confused, does not follow commands    Data Reviewed: I have personally reviewed following labs and imaging studies  CBC: Recent Labs  Lab 03/16/19 0415 03/17/19 0234 03/18/19 0255  WBC 5.5 4.3 6.6  NEUTROABS 3.5  --    --   HGB 10.8* 9.9* 9.4*  HCT 33.4* 30.0* 29.2*  MCV 91.3 88.0 90.1  PLT 428* 432* 888*   Basic Metabolic Panel: Recent Labs  Lab 03/16/19 0415 03/17/19 0234 03/18/19 0255 03/21/19 0259  NA 131* 133* 132* 131*  K 4.5 4.6 4.6 4.1  CL 96* 99 97* 94*  CO2 28 26 27 29   GLUCOSE 112* 97 130* 107*  BUN 28* 22 16 13   CREATININE 0.91 0.81 0.84 0.65  CALCIUM 8.5* 8.4* 8.4* 8.4*  MG  --   --  1.7  --    GFR: Estimated Creatinine Clearance: 62.2 mL/min (by C-G formula based on SCr of 0.65 mg/dL). Liver Function Tests: No results for input(s): AST, ALT, ALKPHOS, BILITOT, PROT, ALBUMIN in the last 168 hours. No results for input(s): LIPASE, AMYLASE in the last 168 hours. No results for input(s): AMMONIA in the last 168 hours. Coagulation Profile: Recent Labs  Lab 03/16/19 0605  INR 1.0   Cardiac Enzymes: No results for input(s): CKTOTAL, CKMB, CKMBINDEX, TROPONINI in the last 168 hours. BNP (last 3 results) No results for input(s): PROBNP in the last 8760 hours. HbA1C: No results for input(s): HGBA1C in the last 72 hours. CBG: No results for input(s): GLUCAP in the last 168 hours. Lipid Profile: No results for input(s): CHOL, HDL, LDLCALC, TRIG, CHOLHDL, LDLDIRECT in the last 72 hours. Thyroid Function Tests: No results for input(s): TSH, T4TOTAL, FREET4, T3FREE, THYROIDAB in the last 72 hours. Anemia Panel: No results for input(s): VITAMINB12, FOLATE, FERRITIN, TIBC, IRON, RETICCTPCT in the last 72 hours. Sepsis Labs: No results for input(s): PROCALCITON, LATICACIDVEN in the last 168 hours.  Recent Results (from the past 240 hour(s))  SARS Coronavirus 2 (CEPHEID - Performed in [place] hospital lab), Hosp Order     Status: None   Collection Time: 03/16/19  4:15 AM   Specimen: Nasopharyngeal Swab  Result Value Ref Range Status   SARS Coronavirus 2 NEGATIVE NEGATIVE Final    Comment: (NOTE) If result is NEGATIVE SARS-CoV-2 target nucleic acids are NOT DETECTED. The  SARS-CoV-2 RNA is generally detectable in upper and lower  respiratory specimens during the acute phase of infection. The lowest  concentration of SARS-CoV-2 viral copies this assay can detect is 250  copies / mL. A negative result does not preclude SARS-CoV-2 infection  and should not be used as the sole basis for treatment or other  patient management decisions.  A negative result may occur with  improper specimen collection / handling, submission of specimen other  than nasopharyngeal swab, presence of viral mutation(s) within the  areas targeted by this assay, and inadequate number of viral copies  (<250 copies / mL). A negative result must be combined with clinical  observations, patient history, and epidemiological information. If result is POSITIVE SARS-CoV-2 target nucleic acids are DETECTED. The SARS-CoV-2 RNA is generally detectable in upper and lower  respiratory specimens dur ing the acute phase of infection.  Positive  results are indicative of active infection with SARS-CoV-2.  Clinical  correlation with patient history and other diagnostic information is  necessary to determine patient infection status.  Positive results do  not rule out bacterial  infection or co-infection with other viruses. If result is PRESUMPTIVE POSTIVE SARS-CoV-2 nucleic acids MAY BE PRESENT.   A presumptive positive result was obtained on the submitted specimen  and confirmed on repeat testing.  While 2019 novel coronavirus  (SARS-CoV-2) nucleic acids may be present in the submitted sample  additional confirmatory testing may be necessary for epidemiological  and / or clinical management purposes  to differentiate between  SARS-CoV-2 and other Sarbecovirus currently known to infect humans.  If clinically indicated additional testing with an alternate test  methodology [phone]) is advised. The SARS-CoV-2 RNA is generally  detectable in upper and lower respiratory sp ecimens during the acute   phase of infection. The expected result is Negative. Fact Sheet for Patients:  StrictlyIdeas.no Fact Sheet for Healthcare Providers: BankingDealers.co.za This test is not yet approved or cleared by the [place] FDA and has been authorized for detection and/or diagnosis of SARS-CoV-2 by FDA under an Emergency Use Authorization (EUA).  This EUA will remain in effect (meaning this test can be used) for the duration of the COVID-19 declaration under Section 564(b)(1) of the Act, 21 U.S.C. section 360bbb-3(b)(1), unless the authorization is terminated or revoked sooner. Performed at [facility], [place] [address] [place], [place] [address]   MRSA PCR Screening     Status: Abnormal   Collection Time: 03/16/19  7:11 PM   Specimen: Nasal Mucosa; Nasopharyngeal  Result Value Ref Range Status   MRSA by PCR POSITIVE (A) NEGATIVE Final    Comment:        The GeneXpert MRSA Assay (FDA approved for NASAL specimens only), is one component of a comprehensive MRSA colonization surveillance program. It is not intended to diagnose MRSA infection nor to guide or monitor treatment for MRSA infections. RESULT CALLED TO, READ BACK BY AND VERIFIED WITH: Saul Fordyce RN 03/16/19 2112 JDW Performed at [facility], [place] [address], [place],  [address]          Radiology Studies: No results found.      Scheduled Meds: . docusate sodium  100 mg Oral BID  . enoxaparin (LOVENOX) injection  40 mg Subcutaneous Q24H  . feeding supplement (ENSURE ENLIVE)  237 mL Oral BID BM  . hydrocortisone cream  1 application Topical BID  . mupirocin ointment  1 application Nasal BID  . nystatin cream  1 application Topical BID  . traZODone  50 mg Oral QHS   Continuous Infusions: . lactated ringers    . methocarbamol (ROBAXIN) IV       LOS: 5 days    Time spent: 30 minutes    [name] Conard J [place] DO [facility] Pager  [phone]  If 7PM-7AM, please contact night-coverage [email or web address] Password [email or web address] 03/21/2019, 1:40 PM

## 2019-03-21 NOTE — Plan of Care (Signed)

## 2019-03-21 NOTE — Plan of Care (Signed)

## 2019-03-22 DIAGNOSIS — W19XXXD Unspecified fall, subsequent encounter: Secondary | ICD-10-CM | POA: Diagnosis not present

## 2019-03-22 DIAGNOSIS — R2689 Other abnormalities of gait and mobility: Secondary | ICD-10-CM | POA: Diagnosis not present

## 2019-03-22 DIAGNOSIS — F331 Major depressive disorder, recurrent, moderate: Secondary | ICD-10-CM | POA: Diagnosis not present

## 2019-03-22 DIAGNOSIS — Z4789 Encounter for other orthopedic aftercare: Secondary | ICD-10-CM | POA: Diagnosis not present

## 2019-03-22 DIAGNOSIS — W19XXXA Unspecified fall, initial encounter: Secondary | ICD-10-CM | POA: Diagnosis not present

## 2019-03-22 DIAGNOSIS — S72001D Fracture of unspecified part of neck of right femur, subsequent encounter for closed fracture with routine healing: Secondary | ICD-10-CM | POA: Diagnosis not present

## 2019-03-22 DIAGNOSIS — M62838 Other muscle spasm: Secondary | ICD-10-CM | POA: Diagnosis not present

## 2019-03-22 DIAGNOSIS — Z96642 Presence of left artificial hip joint: Secondary | ICD-10-CM | POA: Diagnosis not present

## 2019-03-22 DIAGNOSIS — F0391 Unspecified dementia with behavioral disturbance: Secondary | ICD-10-CM | POA: Diagnosis not present

## 2019-03-22 DIAGNOSIS — M25552 Pain in left hip: Secondary | ICD-10-CM | POA: Diagnosis not present

## 2019-03-22 DIAGNOSIS — F064 Anxiety disorder due to known physiological condition: Secondary | ICD-10-CM | POA: Diagnosis not present

## 2019-03-22 DIAGNOSIS — S7002XA Contusion of left hip, initial encounter: Secondary | ICD-10-CM | POA: Diagnosis not present

## 2019-03-22 DIAGNOSIS — R21 Rash and other nonspecific skin eruption: Secondary | ICD-10-CM | POA: Diagnosis not present

## 2019-03-22 DIAGNOSIS — S61502D Unspecified open wound of left wrist, subsequent encounter: Secondary | ICD-10-CM | POA: Diagnosis not present

## 2019-03-22 DIAGNOSIS — S7002XD Contusion of left hip, subsequent encounter: Secondary | ICD-10-CM | POA: Diagnosis not present

## 2019-03-22 DIAGNOSIS — R262 Difficulty in walking, not elsewhere classified: Secondary | ICD-10-CM | POA: Diagnosis not present

## 2019-03-22 DIAGNOSIS — K589 Irritable bowel syndrome without diarrhea: Secondary | ICD-10-CM | POA: Diagnosis not present

## 2019-03-22 DIAGNOSIS — G4730 Sleep apnea, unspecified: Secondary | ICD-10-CM | POA: Diagnosis not present

## 2019-03-22 DIAGNOSIS — Z7401 Bed confinement status: Secondary | ICD-10-CM | POA: Diagnosis not present

## 2019-03-22 DIAGNOSIS — R52 Pain, unspecified: Secondary | ICD-10-CM | POA: Diagnosis not present

## 2019-03-22 DIAGNOSIS — M2550 Pain in unspecified joint: Secondary | ICD-10-CM | POA: Diagnosis not present

## 2019-03-22 DIAGNOSIS — R451 Restlessness and agitation: Secondary | ICD-10-CM | POA: Diagnosis not present

## 2019-03-22 DIAGNOSIS — F339 Major depressive disorder, recurrent, unspecified: Secondary | ICD-10-CM | POA: Diagnosis not present

## 2019-03-22 DIAGNOSIS — S72002D Fracture of unspecified part of neck of left femur, subsequent encounter for closed fracture with routine healing: Secondary | ICD-10-CM | POA: Diagnosis not present

## 2019-03-22 DIAGNOSIS — E785 Hyperlipidemia, unspecified: Secondary | ICD-10-CM | POA: Diagnosis not present

## 2019-03-22 DIAGNOSIS — R296 Repeated falls: Secondary | ICD-10-CM | POA: Diagnosis not present

## 2019-03-22 DIAGNOSIS — M6281 Muscle weakness (generalized): Secondary | ICD-10-CM | POA: Diagnosis not present

## 2019-03-22 DIAGNOSIS — R5381 Other malaise: Secondary | ICD-10-CM | POA: Diagnosis not present

## 2019-03-22 DIAGNOSIS — F0390 Unspecified dementia without behavioral disturbance: Secondary | ICD-10-CM | POA: Diagnosis not present

## 2019-03-22 DIAGNOSIS — R4182 Altered mental status, unspecified: Secondary | ICD-10-CM | POA: Diagnosis not present

## 2019-03-22 DIAGNOSIS — F419 Anxiety disorder, unspecified: Secondary | ICD-10-CM | POA: Diagnosis not present

## 2019-03-22 LAB — SARS CORONAVIRUS 2 BY RT PCR (HOSPITAL ORDER, PERFORMED IN ~~LOC~~ HOSPITAL LAB): SARS Coronavirus 2: NEGATIVE

## 2019-03-22 MED ORDER — ACETAMINOPHEN 325 MG PO TABS: 650.0000 mg | ORAL_TABLET | Freq: Four times a day (QID) | ORAL | 0 refills | Status: AC | PRN

## 2019-03-22 MED ORDER — DOCUSATE SODIUM 100 MG PO CAPS: 100.0000 mg | ORAL_CAPSULE | Freq: Two times a day (BID) | ORAL | 0 refills | Status: AC

## 2019-03-22 MED ORDER — TRAZODONE HCL 50 MG PO TABS: 50.0000 mg | ORAL_TABLET | Freq: Every day | ORAL | 0 refills | Status: AC

## 2019-03-22 MED ORDER — METHOCARBAMOL 500 MG PO TABS: 500.0000 mg | ORAL_TABLET | Freq: Four times a day (QID) | ORAL | 0 refills | Status: AC | PRN

## 2019-03-22 MED ORDER — LORAZEPAM 0.5 MG PO TABS: 0.5000 mg | ORAL_TABLET | Freq: Two times a day (BID) | ORAL | 0 refills | Status: DC | PRN

## 2019-03-22 MED ORDER — POLYETHYLENE GLYCOL 3350 17 G PO PACK: 17.0000 g | PACK | Freq: Every day | ORAL | 0 refills | Status: AC | PRN

## 2019-03-22 NOTE — Consult Note (Signed)
   [facility] CM Inpatient Consult   03/22/2019  [name] [name] 1930-11-06 [id number]    Patient reviewed ifpotential [place] Management services are neededunder his Medicare/ NextGen plan. Review of patient's medical record reveals that patienthas a 30 day readmission, 2 hospitalizations in the past 6 months, with a 13% risk score for unplanned readmissions.  Medical record review and MD history and physical dated 03/16/19, reveal as follows: Trystin Jozef Eisenbeis is a 85 y.o. male with medical history significant for dementia, hyperlipidemia, GERD, depression, anxiety, recent right hip fracture status post surgery in mid June, presents after a fall from a chair on 03/08/2019.  History gotten from chart review as patient has dementia.  X-ray at the facility showed left hip fracture. Patient has been complaining of ongoing left hip pain since the fall.  Brought in from the SNF. Repeat x-ray of the hip showed acute fracture of the proximal left femur with significant interval displacement, status post left hip hemiarthroplasty on 03/17/2019 by Dr. Erlinda Hong.  Primary care provider is Dr. Leanna Battles with [facility], listed to provide transition of care.  Review of transition of care team notes show that patient came from [facility] (skilled nursing facility) and HCPOA- Mathis Bud would like patient to return to SNF to complete rehab as per PT/ OT recommendation. Patient is from [facility] care, has been at [facility] receiving SNF-rehab.   Pleaserefer to [facility] care management if there are disposition needs forfollow-upas appropriate.  Of note, [facility] Care Management services does not replace or interfere with any services that are arranged by transition of care case management or social work.  [facility] PAC (post acute coordinator) notified of patient's disposition for appropriate follow-up.   For questions and additional information, please call:   Keyontae Huckeby A. Raenette Sakata, BSN, RN-BC [facility] Liaison Cell: [phone]

## 2019-03-22 NOTE — TOC Transition Note (Signed)
Transition of Care [facility]) - CM/SW Discharge Note   Patient Details  Name: [name] [name] MRN: [id number] Date of Birth: 24-Aug-1931  Transition of Care [facility]) CM/SW Contact:  Alberteen Sam, LCSW Phone Number: 03/22/2019, 12:20 PM   Clinical Narrative:     Patient will DC to: [facility] Anticipated DC date: 03/22/2019 Family notified:[name] Transport by: Corey Harold  Per MD patient ready for DC to [facility] . RN, patient, patient's family, and facility notified of DC. Discharge Summary sent to facility. RN given number for report   [phone] Room 104. DC packet on chart. Ambulance transport requested for patient.  CSW signing off.  [place], [place]   Final next level of care: Skilled Nursing Facility Barriers to Discharge: No Barriers Identified   Patient Goals and CMS Choice Patient states their goals for this hospitalization and ongoing recovery are:: Pt HCPOA would like the patient to return to SNF to complete rehab CMS Medicare.gov Compare Post Acute Care list provided to:: Patient Represenative (must comment)([name]) Choice offered to / list presented to : [facility] POA / Guardian([name])  Discharge Placement PASRR number recieved: 03/19/19            Patient chooses bed at: [facility] Patient to be transferred to facility by: [place] Name of family member notified: Aaron Edelman Patient and family notified of of transfer: 03/22/19  Discharge Plan and Services In-house Referral: Clinical Social Work Discharge Planning Services: NA Post Acute Care Choice: [place]          DME Arranged: N/A DME Agency: NA       HH Arranged: NA HH Agency: NA        Social Determinants of Health (SDOH) Interventions     Readmission Risk Interventions No flowsheet data found.

## 2019-03-22 NOTE — Progress Notes (Signed)
CSW lvm to inform HCPOA Aaron Edelman that his preference of [facility] has no beds, therefore patient will discharge back to [facility] today pending updated [place], [place]

## 2019-03-22 NOTE — Progress Notes (Addendum)
Attempted to call report x 2.   [phone] - attempted to call report with no answer. RN number sent with [facility] so they can call when ready.

## 2019-03-22 NOTE — Discharge Summary (Signed)
Physician Discharge Summary  [name] [name] DJM:[id number] DOB: Aug 29, 1931 DOA: 03/16/2019  PCP: Leanna Battles, MD  Admit date: 03/16/2019 Discharge date: 03/22/2019  Admitted From: [facility] Disposition: [facility]  Recommendations for Outpatient Follow-up:  1. Follow up with PCP in 1 week 2. Follow-up with orthopedics, Dr. Erlinda Hong in 2 weeks 3. Continue Lovenox for DVT prophylaxis per orthopedics  Home Health: N/A Equipment/Devices: none  Discharge Condition: Stable CODE STATUS: Full code Diet recommendation: Regular diet  History of present illness:  [name] [name] a 85 y.o.malewith medical history significant fordementia, hyperlipidemia, GERD, depression, anxiety, recent right hip fracture status post surgery in mid June, presents after a fall from a chair on 03/08/2019.History obtained from chart review as patient has dementia. X-ray at the facility showed left hip fracture. Patient has been complaining of ongoing left hip pain since the fall. Brought in from the SNF.  ED Course:Patient's vitals remained stable,labs showed mild hyponatremia and normocytic anemia. Patient is COVID negative.Repeat x-ray of the hip showed acute fracture of the proximal left femur with significant interval displacement.Dr. Bernadette Hoit orthopedics was consulted,plan for surgery on 03/17/2019  Hospital course:  Left proximal femur fracture Patient presenting from SNF following recent fall on 03/08/2019 with persistent left hip pain.  X-ray notable for left proximal femur fracture.  Patient underwent left hip hemiarthroplasty on 03/17/2019 by Dr. Erlinda Hong.  Continue pain control with Norco prn and DVT prophylaxis with Lovenox.  Discharge back to [facility] for further physical therapy.  Will need follow-up with orthopedics in 2 weeks for wound check and staple removal.  Hyponatremia Etiology likely secondary to hypovolemic hyponatremia from  dehydration.  Sodium up to 133 at time of discharge.  Continue to encourage increased oral intake.  Dementia Continue trazodone 50 mg p.o. nightly and fall precautions.  Discharge Diagnoses:  Active Problems:   Dementia [facility])    Discharge Instructions  Discharge Instructions    Call MD for:  difficulty breathing, headache or visual disturbances   Complete by: As directed    Call MD for:  extreme fatigue   Complete by: As directed    Call MD for:  persistant dizziness or light-headedness   Complete by: As directed    Call MD for:  persistant nausea and vomiting   Complete by: As directed    Call MD for:  redness, tenderness, or signs of infection (pain, swelling, redness, odor or green/yellow discharge around incision site)   Complete by: As directed    Call MD for:  severe uncontrolled pain   Complete by: As directed    Call MD for:  temperature >100.4   Complete by: As directed    Diet - low sodium heart healthy   Complete by: As directed    Increase activity slowly   Complete by: As directed    Weight bearing as tolerated   Complete by: As directed      Allergies as of 03/22/2019      Reactions   Codeine Other (See Comments)   Unknown rxn per MAR   Penicillins Rash   Per [facility] Did it involve swelling of the face/tongue/throat, SOB, or low BP? Unknown Did it involve sudden or severe rash/hives, skin peeling, or any reaction on the inside of your mouth or nose? Unknown Did you need to seek medical attention at a hospital or doctor's office? Unknown When did it last happen? Unknown If all above answers are "NO", may proceed with  cephalosporin use.   Ranitidine Other (See Comments)   Unknown rxn per MAR   Amoxicillin Rash   Per MAR Did it involve swelling of the face/tongue/throat, SOB, or low BP? Unknown Did it involve sudden or severe rash/hives, skin peeling, or any reaction on the inside of your mouth or nose? Unknown Did you need to seek medical attention  at a hospital or doctor's office? Unknown When did it last happen? Unknown If all above answers are "NO", may proceed with cephalosporin use.      Medication List    STOP taking these medications   HYDROcodone-acetaminophen 5-325 MG tablet Commonly known as: NORCO/VICODIN   sulfamethoxazole-trimethoprim 800-160 MG tablet Commonly known as: BACTRIM DS     TAKE these medications   acetaminophen 325 MG tablet Commonly known as: TYLENOL Take 2 tablets (650 mg total) by mouth every 6 (six) hours as needed for mild pain, fever or headache (pain score 1-3 or temp > 100.5). What changed:   how much to take  reasons to take this   docusate sodium 100 MG capsule Commonly known as: COLACE Take 1 capsule (100 mg total) by mouth 2 (two) times daily.   enoxaparin 40 MG/0.4ML injection Commonly known as: LOVENOX Inject 0.4 mLs (40 mg total) into the skin daily.   feeding supplement (ENSURE ENLIVE) Liqd Take 237 mLs by mouth 2 (two) times daily between meals.   hydrocortisone 2.5 % cream Apply 1 application topically 2 (two) times daily. Apply to rash on right arm   LORazepam 0.5 MG tablet Commonly known as: ATIVAN Take 1 tablet (0.5 mg total) by mouth every 12 (twelve) hours as needed (agitation).   methocarbamol 500 MG tablet Commonly known as: ROBAXIN Take 1 tablet (500 mg total) by mouth every 6 (six) hours as needed for muscle spasms.   multivitamin with minerals Tabs tablet Take 1 tablet by mouth daily.   neomycin-bacitracin-polymyxin [phone] ointment Apply 1 application topically every evening. Apply to left wrist for skin tear   nystatin cream Commonly known as: MYCOSTATIN Apply 1 application topically 2 (two) times daily. Apply to groin/penis for yeast   oxyCODONE-acetaminophen 5-325 MG tablet Commonly known as: Percocet Take 1-2 tablets by mouth every 8 (eight) hours as needed for severe pain.   polyethylene glycol 17 g packet Commonly known as:  MIRALAX / GLYCOLAX Take 17 g by mouth daily as needed for mild constipation.   traZODone 50 MG tablet Commonly known as: DESYREL Take 1 tablet (50 mg total) by mouth at bedtime.            Discharge Care Instructions  (From admission, onward)         Start     Ordered   03/17/19 0000  Weight bearing as tolerated     03/17/19 1301         Follow-up Information    Leandrew Koyanagi, MD In 2 weeks.   Specialty: Orthopedic Surgery Why: For wound re-check, For suture removal Contact information: [place] [address] [phone]        Leanna Battles, MD. Call in 1 week(s).   Specialty: Internal Medicine Contact information: [place] [address] [phone]          Allergies  Allergen Reactions  . Codeine Other (See Comments)    Unknown rxn per MAR  . Penicillins Rash    Per MAR Did it involve swelling of the face/tongue/throat, SOB, or low BP? Unknown Did it involve  sudden or severe rash/hives, skin peeling, or any reaction on the inside of your mouth or nose? Unknown Did you need to seek medical attention at a hospital or doctor's office? Unknown When did it last happen? Unknown If all above answers are "NO", may proceed with cephalosporin use.  . Ranitidine Other (See Comments)    Unknown rxn per MAR  . Amoxicillin Rash    Per MAR Did it involve swelling of the face/tongue/throat, SOB, or low BP? Unknown Did it involve sudden or severe rash/hives, skin peeling, or any reaction on the inside of your mouth or nose? Unknown Did you need to seek medical attention at a hospital or doctor's office? Unknown When did it last happen? Unknown If all above answers are "NO", may proceed with cephalosporin use.    Consultations:  Orthopedics, Dr. Erlinda Hong   Procedures/Studies: Pelvis Portable  Result Date: 03/17/2019 CLINICAL DATA:  Hip replacement.  Pain. EXAM: PORTABLE PELVIS 1-2 VIEWS COMPARISON:   None. FINDINGS: The patient is status post prior total hip arthroplasty on the left. The alignment appears near anatomic. The patient is status post prior percutaneous pinning of the right hip. There are advanced degenerative changes of the right hip. There is no acute displaced fracture. No dislocation. IMPRESSION: No acute osseous abnormality. Electronically Signed   By: Constance Holster M.D.   On: 03/17/2019 14:58   Dg Chest Port 1 View  Result Date: 03/16/2019 CLINICAL DATA:  Preop hip fracture. EXAM: PORTABLE CHEST 1 VIEW COMPARISON:  Radiograph 02/19/2019 FINDINGS: The cardiomediastinal contours are normal. Aortic atherosclerosis. Pulmonary vasculature is normal. No consolidation, pleural effusion, or pneumothorax. Skin folds project over the left chest. No acute osseous abnormalities are seen. IMPRESSION: 1. No acute chest finding. 2.  Aortic Atherosclerosis (ICD10-I70.0). Electronically Signed   By: Keith Rake M.D.   On: 03/16/2019 03:58   Dg C-arm 1-60 Min  Result Date: 03/17/2019 CLINICAL DATA:  LEFT anterior approach hemi hip arthroplasty for LEFT hip fracture. EXAM: OPERATIVE LEFT HIP (WITH PELVIS IF PERFORMED) to VIEWS TECHNIQUE: Fluoroscopic spot image(s) were submitted for interpretation post-operatively. COMPARISON:  None. FINDINGS: Intraoperative fluoroscopic images of the LEFT hip showing arthroplasty hardware. Hardware appears appropriately positioned. Fluoroscopy provided for 16 seconds. IMPRESSION: Intraoperative fluoroscopic images showing LEFT hip arthroplasty hardware. No evidence of surgical complicating feature. Electronically Signed   By: Franki Cabot M.D.   On: 03/17/2019 13:14   Dg C-arm 1-60 Min  Result Date: 02/20/2019 CLINICAL DATA:  Fracture EXAM: DG C-ARM 61-120 MIN COMPARISON:  02/19/2019 FINDINGS: Two images were provided for a total of 1 minutes and 8 seconds of fluoroscopy time during percutaneous pinning of the right hip. The alignment is stable. The  hardware is intact. IMPRESSION: Status post percutaneous pinning of the right hip. Electronically Signed   By: Constance Holster M.D.   On: 02/20/2019 14:53   Dg Hip Operative Unilat W Or W/o Pelvis Left  Result Date: 03/17/2019 CLINICAL DATA:  LEFT anterior approach hemi hip arthroplasty for LEFT hip fracture. EXAM: OPERATIVE LEFT HIP (WITH PELVIS IF PERFORMED) to VIEWS TECHNIQUE: Fluoroscopic spot image(s) were submitted for interpretation post-operatively. COMPARISON:  None. FINDINGS: Intraoperative fluoroscopic images of the LEFT hip showing arthroplasty hardware. Hardware appears appropriately positioned. Fluoroscopy provided for 16 seconds. IMPRESSION: Intraoperative fluoroscopic images showing LEFT hip arthroplasty hardware. No evidence of surgical complicating feature. Electronically Signed   By: Franki Cabot M.D.   On: 03/17/2019 13:14   Dg Hip Operative Unilat W Or W/o Pelvis Right  Result  Date: 02/20/2019 CLINICAL DATA:  Fracture EXAM: OPERATIVE right HIP (WITH PELVIS IF PERFORMED) to VIEWS TECHNIQUE: Fluoroscopic spot image(s) were submitted for interpretation post-operatively. COMPARISON:  02/19/2019 FINDINGS: The patient has undergone percutaneous pinning of the right hip. The alignment appears stable. Hardware is intact. Two images were obtained. The total fluoroscopy time was 1 minutes and 8 seconds. IMPRESSION: Fluoroscopy for right-sided percutaneous pinning of the femur. Electronically Signed   By: Constance Holster M.D.   On: 02/20/2019 14:53   Dg Hip Unilat W Or Wo Pelvis 2-3 Views Left  Result Date: 03/16/2019 CLINICAL DATA:  Pain EXAM: DG HIP (WITH OR WITHOUT PELVIS) 2-3V LEFT COMPARISON:  March 03, 2019 FINDINGS: Again noted is a transcervical fracture of the proximal left femur with interval increase in displacement. There is surrounding soft tissue swelling. There is no dislocation. The patient is status post prior percutaneous pinning of the right hip. IMPRESSION: Acute  fracture of the proximal left femur with significant interval displacement when compared to prior study. Electronically Signed   By: Constance Holster M.D.   On: 03/16/2019 03:37   Xr Hip Unilat W Or W/o Pelvis 2-3 Views Right  Result Date: 03/03/2019 Stable screw fixation of femoral neck fracture    Subjective: Patient seen and examined at bedside, pleasantly confused.  Pain controlled. No other complaints at this time.    Patient to discharge back to SNF today. Denies headache, no chest pain, no palpitations, no shortness of breath, no abdominal pain.  No acute events overnight per nurse staff.   Discharge Exam: Vitals:   03/22/19 0510 03/22/19 0923  BP: 106/77 93/60  Pulse: 88 92  Resp: 14 16  Temp: 98 F (36.7 C) 97.7 F (36.5 C)  SpO2: 99% (!) 53%   Vitals:   03/21/19 2052 03/21/19 2054 03/22/19 0510 03/22/19 0923  BP: (!) 92/56 (!) 102/57 106/77 93/60  Pulse: 91 (!) 117 88 92  Resp: 16 16 14 16   Temp: 98.3 F (36.8 C) 98.9 F (37.2 C) 98 F (36.7 C) 97.7 F (36.5 C)  TempSrc: Oral Oral Oral Oral  SpO2: 98% 96% 99% (!) 53%  Weight:      Height:        General: Pt is alert, awake, not in acute distress; pleasantly confused Cardiovascular: RRR, S1/S2 +, no rubs, no gallops Respiratory: CTA bilaterally, no wheezing, no rhonchi Abdominal: Soft, NT, ND, bowel sounds + Extremities: no edema, no cyanosis    The results of significant diagnostics from this hospitalization (including imaging, microbiology, ancillary and laboratory) are listed below for reference.     Microbiology: Recent Results (from the past 240 hour(s))  SARS Coronavirus 2 (CEPHEID - Performed in [facility] hospital lab), Hosp Order     Status: None   Collection Time: 03/16/19  4:15 AM   Specimen: Nasopharyngeal Swab  Result Value Ref Range Status   SARS Coronavirus 2 NEGATIVE NEGATIVE Final    Comment: (NOTE) If result is NEGATIVE SARS-CoV-2 target nucleic acids are NOT DETECTED. The  SARS-CoV-2 RNA is generally detectable in upper and lower  respiratory specimens during the acute phase of infection. The lowest  concentration of SARS-CoV-2 viral copies this assay can detect is 250  copies / mL. A negative result does not preclude SARS-CoV-2 infection  and should not be used as the sole basis for treatment or other  patient management decisions.  A negative result may occur with  improper specimen collection / handling, submission of specimen other  than  nasopharyngeal swab, presence of viral mutation(s) within the  areas targeted by this assay, and inadequate number of viral copies  (<250 copies / mL). A negative result must be combined with clinical  observations, patient history, and epidemiological information. If result is POSITIVE SARS-CoV-2 target nucleic acids are DETECTED. The SARS-CoV-2 RNA is generally detectable in upper and lower  respiratory specimens dur ing the acute phase of infection.  Positive  results are indicative of active infection with SARS-CoV-2.  Clinical  correlation with patient history and other diagnostic information is  necessary to determine patient infection status.  Positive results do  not rule out bacterial infection or co-infection with other viruses. If result is PRESUMPTIVE POSTIVE SARS-CoV-2 nucleic acids MAY BE PRESENT.   A presumptive positive result was obtained on the submitted specimen  and confirmed on repeat testing.  While 2019 novel coronavirus  (SARS-CoV-2) nucleic acids may be present in the submitted sample  additional confirmatory testing may be necessary for epidemiological  and / or clinical management purposes  to differentiate between  SARS-CoV-2 and other Sarbecovirus currently known to infect humans.  If clinically indicated additional testing with an alternate test  methodology [phone]) is advised. The SARS-CoV-2 RNA is generally  detectable in upper and lower respiratory sp ecimens during the acute   phase of infection. The expected result is Negative. Fact Sheet for Patients:  StrictlyIdeas.no Fact Sheet for Healthcare Providers: BankingDealers.co.za This test is not yet approved or cleared by the [place] FDA and has been authorized for detection and/or diagnosis of SARS-CoV-2 by FDA under an Emergency Use Authorization (EUA).  This EUA will remain in effect (meaning this test can be used) for the duration of the COVID-19 declaration under Section 564(b)(1) of the Act, 21 U.S.C. section 360bbb-3(b)(1), unless the authorization is terminated or revoked sooner. Performed at [facility], [place] [address], [place], [place] [address]   MRSA PCR Screening     Status: Abnormal   Collection Time: 03/16/19  7:11 PM   Specimen: Nasal Mucosa; Nasopharyngeal  Result Value Ref Range Status   MRSA by PCR POSITIVE (A) NEGATIVE Final    Comment:        The GeneXpert MRSA Assay (FDA approved for NASAL specimens only), is one component of a comprehensive MRSA colonization surveillance program. It is not intended to diagnose MRSA infection nor to guide or monitor treatment for MRSA infections. RESULT CALLED TO, READ BACK BY AND VERIFIED WITH: Saul Fordyce RN 03/16/19 2112 JDW Performed at [facility], [place] [address], [place], [place] [address]   SARS Coronavirus 2 (CEPHEID - Performed in [facility] hospital lab), Hosp Order     Status: None   Collection Time: 03/22/19  9:58 AM   Specimen: Nasopharyngeal Swab  Result Value Ref Range Status   SARS Coronavirus 2 NEGATIVE NEGATIVE Final    Comment: (NOTE) If result is NEGATIVE SARS-CoV-2 target nucleic acids are NOT DETECTED. The SARS-CoV-2 RNA is generally detectable in upper and lower  respiratory specimens during the acute phase of infection. The lowest  concentration of SARS-CoV-2 viral copies this assay can detect is 250  copies / mL. A negative result does not  preclude SARS-CoV-2 infection  and should not be used as the sole basis for treatment or other  patient management decisions.  A negative result may occur with  improper specimen collection / handling, submission of specimen other  than nasopharyngeal swab, presence of viral mutation(s) within the  areas targeted  by this assay, and inadequate number of viral copies  (<250 copies / mL). A negative result must be combined with clinical  observations, patient history, and epidemiological information. If result is POSITIVE SARS-CoV-2 target nucleic acids are DETECTED. The SARS-CoV-2 RNA is generally detectable in upper and lower  respiratory specimens dur ing the acute phase of infection.  Positive  results are indicative of active infection with SARS-CoV-2.  Clinical  correlation with patient history and other diagnostic information is  necessary to determine patient infection status.  Positive results do  not rule out bacterial infection or co-infection with other viruses. If result is PRESUMPTIVE POSTIVE SARS-CoV-2 nucleic acids MAY BE PRESENT.   A presumptive positive result was obtained on the submitted specimen  and confirmed on repeat testing.  While 2019 novel coronavirus  (SARS-CoV-2) nucleic acids may be present in the submitted sample  additional confirmatory testing may be necessary for epidemiological  and / or clinical management purposes  to differentiate between  SARS-CoV-2 and other Sarbecovirus currently known to infect humans.  If clinically indicated additional testing with an alternate test  methodology [phone]) is advised. The SARS-CoV-2 RNA is generally  detectable in upper and lower respiratory sp ecimens during the acute  phase of infection. The expected result is Negative. Fact Sheet for Patients:  StrictlyIdeas.no Fact Sheet for Healthcare Providers: BankingDealers.co.za This test is not yet approved or cleared by  the [place] FDA and has been authorized for detection and/or diagnosis of SARS-CoV-2 by FDA under an Emergency Use Authorization (EUA).  This EUA will remain in effect (meaning this test can be used) for the duration of the COVID-19 declaration under Section 564(b)(1) of the Act, 21 U.S.C. section 360bbb-3(b)(1), unless the authorization is terminated or revoked sooner. Performed at [facility], [place] [address], [place], [place] [address]      Labs: BNP (last 3 results) No results for input(s): BNP in the last 8760 hours. Basic Metabolic Panel: Recent Labs  Lab 03/16/19 0415 03/17/19 0234 03/18/19 0255 03/21/19 0259  NA 131* 133* 132* 131*  K 4.5 4.6 4.6 4.1  CL 96* 99 97* 94*  CO2 28 26 27 29   GLUCOSE 112* 97 130* 107*  BUN 28* 22 16 13   CREATININE 0.91 0.81 0.84 0.65  CALCIUM 8.5* 8.4* 8.4* 8.4*  MG  --   --  1.7  --    Liver Function Tests: No results for input(s): AST, ALT, ALKPHOS, BILITOT, PROT, ALBUMIN in the last 168 hours. No results for input(s): LIPASE, AMYLASE in the last 168 hours. No results for input(s): AMMONIA in the last 168 hours. CBC: Recent Labs  Lab 03/16/19 0415 03/17/19 0234 03/18/19 0255  WBC 5.5 4.3 6.6  NEUTROABS 3.5  --   --   HGB 10.8* 9.9* 9.4*  HCT 33.4* 30.0* 29.2*  MCV 91.3 88.0 90.1  PLT 428* 432* 410*   Cardiac Enzymes: No results for input(s): CKTOTAL, CKMB, CKMBINDEX, TROPONINI in the last 168 hours. BNP: Invalid input(s): POCBNP CBG: No results for input(s): GLUCAP in the last 168 hours. D-Dimer No results for input(s): DDIMER in the last 72 hours. Hgb A1c No results for input(s): HGBA1C in the last 72 hours. Lipid Profile No results for input(s): CHOL, HDL, LDLCALC, TRIG, CHOLHDL, LDLDIRECT in the last 72 hours. Thyroid function studies No results for input(s): TSH, T4TOTAL, T3FREE, THYROIDAB in the last 72 hours.  Invalid input(s): FREET3 Anemia work up No results for input(s): VITAMINB12, FOLATE,  FERRITIN, TIBC,  IRON, RETICCTPCT in the last 72 hours. Urinalysis    Component Value Date/Time   COLORURINE STRAW (A) 02/19/2019 1708   APPEARANCEUR CLEAR 02/19/2019 1708   LABSPEC 1.008 02/19/2019 1708   PHURINE 7.0 02/19/2019 1708   GLUCOSEU NEGATIVE 02/19/2019 1708   HGBUR NEGATIVE 02/19/2019 1708   BILIRUBINUR NEGATIVE 02/19/2019 1708   KETONESUR NEGATIVE 02/19/2019 1708   PROTEINUR NEGATIVE 02/19/2019 1708   NITRITE NEGATIVE 02/19/2019 1708   LEUKOCYTESUR NEGATIVE 02/19/2019 1708   Sepsis Labs Invalid input(s): PROCALCITONIN,  WBC,  LACTICIDVEN Microbiology Recent Results (from the past 240 hour(s))  SARS Coronavirus 2 (CEPHEID - Performed in [facility] hospital lab), Hosp Order     Status: None   Collection Time: 03/16/19  4:15 AM   Specimen: Nasopharyngeal Swab  Result Value Ref Range Status   SARS Coronavirus 2 NEGATIVE NEGATIVE Final    Comment: (NOTE) If result is NEGATIVE SARS-CoV-2 target nucleic acids are NOT DETECTED. The SARS-CoV-2 RNA is generally detectable in upper and lower  respiratory specimens during the acute phase of infection. The lowest  concentration of SARS-CoV-2 viral copies this assay can detect is 250  copies / mL. A negative result does not preclude SARS-CoV-2 infection  and should not be used as the sole basis for treatment or other  patient management decisions.  A negative result may occur with  improper specimen collection / handling, submission of specimen other  than nasopharyngeal swab, presence of viral mutation(s) within the  areas targeted by this assay, and inadequate number of viral copies  (<250 copies / mL). A negative result must be combined with clinical  observations, patient history, and epidemiological information. If result is POSITIVE SARS-CoV-2 target nucleic acids are DETECTED. The SARS-CoV-2 RNA is generally detectable in upper and lower  respiratory specimens dur ing the acute phase of infection.  Positive  results  are indicative of active infection with SARS-CoV-2.  Clinical  correlation with patient history and other diagnostic information is  necessary to determine patient infection status.  Positive results do  not rule out bacterial infection or co-infection with other viruses. If result is PRESUMPTIVE POSTIVE SARS-CoV-2 nucleic acids MAY BE PRESENT.   A presumptive positive result was obtained on the submitted specimen  and confirmed on repeat testing.  While 2019 novel coronavirus  (SARS-CoV-2) nucleic acids may be present in the submitted sample  additional confirmatory testing may be necessary for epidemiological  and / or clinical management purposes  to differentiate between  SARS-CoV-2 and other Sarbecovirus currently known to infect humans.  If clinically indicated additional testing with an alternate test  methodology [phone]) is advised. The SARS-CoV-2 RNA is generally  detectable in upper and lower respiratory sp ecimens during the acute  phase of infection. The expected result is Negative. Fact Sheet for Patients:  StrictlyIdeas.no Fact Sheet for Healthcare Providers: BankingDealers.co.za This test is not yet approved or cleared by the [place] FDA and has been authorized for detection and/or diagnosis of SARS-CoV-2 by FDA under an Emergency Use Authorization (EUA).  This EUA will remain in effect (meaning this test can be used) for the duration of the COVID-19 declaration under Section 564(b)(1) of the Act, 21 U.S.C. section 360bbb-3(b)(1), unless the authorization is terminated or revoked sooner. Performed at [facility], [place] [address] [place], [place] [address]   MRSA PCR Screening     Status: Abnormal   Collection Time: 03/16/19  7:11 PM   Specimen: Nasal Mucosa; Nasopharyngeal  Result Value Ref  Range Status   MRSA by PCR POSITIVE (A) NEGATIVE Final    Comment:        The GeneXpert MRSA Assay  (FDA approved for NASAL specimens only), is one component of a comprehensive MRSA colonization surveillance program. It is not intended to diagnose MRSA infection nor to guide or monitor treatment for MRSA infections. RESULT CALLED TO, READ BACK BY AND VERIFIED WITH: Saul Fordyce RN 03/16/19 2112 JDW Performed at [facility], [place] [address], [place], [place] [address]   SARS Coronavirus 2 (CEPHEID - Performed in [place] hospital lab), Hosp Order     Status: None   Collection Time: 03/22/19  9:58 AM   Specimen: Nasopharyngeal Swab  Result Value Ref Range Status   SARS Coronavirus 2 NEGATIVE NEGATIVE Final    Comment: (NOTE) If result is NEGATIVE SARS-CoV-2 target nucleic acids are NOT DETECTED. The SARS-CoV-2 RNA is generally detectable in upper and lower  respiratory specimens during the acute phase of infection. The lowest  concentration of SARS-CoV-2 viral copies this assay can detect is 250  copies / mL. A negative result does not preclude SARS-CoV-2 infection  and should not be used as the sole basis for treatment or other  patient management decisions.  A negative result may occur with  improper specimen collection / handling, submission of specimen other  than nasopharyngeal swab, presence of viral mutation(s) within the  areas targeted by this assay, and inadequate number of viral copies  (<250 copies / mL). A negative result must be combined with clinical  observations, patient history, and epidemiological information. If result is POSITIVE SARS-CoV-2 target nucleic acids are DETECTED. The SARS-CoV-2 RNA is generally detectable in upper and lower  respiratory specimens dur ing the acute phase of infection.  Positive  results are indicative of active infection with SARS-CoV-2.  Clinical  correlation with patient history and other diagnostic information is  necessary to determine patient infection status.  Positive results do  not rule out bacterial infection or  co-infection with other viruses. If result is PRESUMPTIVE POSTIVE SARS-CoV-2 nucleic acids MAY BE PRESENT.   A presumptive positive result was obtained on the submitted specimen  and confirmed on repeat testing.  While 2019 novel coronavirus  (SARS-CoV-2) nucleic acids may be present in the submitted sample  additional confirmatory testing may be necessary for epidemiological  and / or clinical management purposes  to differentiate between  SARS-CoV-2 and other Sarbecovirus currently known to infect humans.  If clinically indicated additional testing with an alternate test  methodology [phone]) is advised. The SARS-CoV-2 RNA is generally  detectable in upper and lower respiratory sp ecimens during the acute  phase of infection. The expected result is Negative. Fact Sheet for Patients:  StrictlyIdeas.no Fact Sheet for Healthcare Providers: BankingDealers.co.za This test is not yet approved or cleared by the [place] FDA and has been authorized for detection and/or diagnosis of SARS-CoV-2 by FDA under an Emergency Use Authorization (EUA).  This EUA will remain in effect (meaning this test can be used) for the duration of the COVID-19 declaration under Section 564(b)(1) of the Act, 21 U.S.C. section 360bbb-3(b)(1), unless the authorization is terminated or revoked sooner. Performed at [facility], [place] [address], [place], [place] [address]      Time coordinating discharge: Over 30 minutes  SIGNED:   Donnamarie Poag [place] DO  [facility] 03/22/2019, 11:46 AM

## 2019-03-23 ENCOUNTER — Other Ambulatory Visit: Payer: Self-pay | Admitting: *Deleted

## 2019-03-23 DIAGNOSIS — M6281 Muscle weakness (generalized): Secondary | ICD-10-CM | POA: Diagnosis not present

## 2019-03-23 DIAGNOSIS — R262 Difficulty in walking, not elsewhere classified: Secondary | ICD-10-CM | POA: Diagnosis not present

## 2019-03-23 DIAGNOSIS — M25552 Pain in left hip: Secondary | ICD-10-CM | POA: Diagnosis not present

## 2019-03-23 DIAGNOSIS — R5381 Other malaise: Secondary | ICD-10-CM | POA: Diagnosis not present

## 2019-03-23 NOTE — Patient Outreach (Signed)
Member assessed for potential [facility] Care Management needs as a benefit of  [place] Medicare.  Member is currently at [facility] SNF receiving rehab therapy.   Member discussed in telephonic IDT meeting with facility staff, [facility] UM team, and writer.  Mr. Lebeda readmitted back to [facility]. Discharge plan prior to readmission to SNF was to return to [facility] memory care.   If disposition plan is ALF or LTC, writer will eventually sign off. Will continue to follow for potential [facility] Care Management needs until disposition plans are known.  Will continue to collaborate with facility staff and [facility] UM team on member.   Marthenia Rolling, MSN-Ed, RN,BSN [place] Acute Care Coordinator [phone] [facility]) [phone]  (Toll free office)

## 2019-03-24 DIAGNOSIS — F0391 Unspecified dementia with behavioral disturbance: Secondary | ICD-10-CM | POA: Diagnosis not present

## 2019-03-24 DIAGNOSIS — R296 Repeated falls: Secondary | ICD-10-CM | POA: Diagnosis not present

## 2019-03-24 DIAGNOSIS — S7002XA Contusion of left hip, initial encounter: Secondary | ICD-10-CM | POA: Diagnosis not present

## 2019-03-24 DIAGNOSIS — M25552 Pain in left hip: Secondary | ICD-10-CM | POA: Diagnosis not present

## 2019-03-24 DIAGNOSIS — S72002D Fracture of unspecified part of neck of left femur, subsequent encounter for closed fracture with routine healing: Secondary | ICD-10-CM | POA: Diagnosis not present

## 2019-03-28 DIAGNOSIS — F419 Anxiety disorder, unspecified: Secondary | ICD-10-CM | POA: Diagnosis not present

## 2019-03-28 DIAGNOSIS — M6281 Muscle weakness (generalized): Secondary | ICD-10-CM | POA: Diagnosis not present

## 2019-03-28 DIAGNOSIS — R262 Difficulty in walking, not elsewhere classified: Secondary | ICD-10-CM | POA: Diagnosis not present

## 2019-03-28 DIAGNOSIS — S72002D Fracture of unspecified part of neck of left femur, subsequent encounter for closed fracture with routine healing: Secondary | ICD-10-CM | POA: Diagnosis not present

## 2019-03-28 DIAGNOSIS — M25552 Pain in left hip: Secondary | ICD-10-CM | POA: Diagnosis not present

## 2019-03-28 DIAGNOSIS — R5381 Other malaise: Secondary | ICD-10-CM | POA: Diagnosis not present

## 2019-03-28 DIAGNOSIS — F0390 Unspecified dementia without behavioral disturbance: Secondary | ICD-10-CM | POA: Diagnosis not present

## 2019-03-29 DIAGNOSIS — S7002XD Contusion of left hip, subsequent encounter: Secondary | ICD-10-CM | POA: Diagnosis not present

## 2019-03-29 DIAGNOSIS — R296 Repeated falls: Secondary | ICD-10-CM | POA: Diagnosis not present

## 2019-03-31 DIAGNOSIS — R5381 Other malaise: Secondary | ICD-10-CM | POA: Diagnosis not present

## 2019-03-31 DIAGNOSIS — M25552 Pain in left hip: Secondary | ICD-10-CM | POA: Diagnosis not present

## 2019-03-31 DIAGNOSIS — M6281 Muscle weakness (generalized): Secondary | ICD-10-CM | POA: Diagnosis not present

## 2019-03-31 DIAGNOSIS — R262 Difficulty in walking, not elsewhere classified: Secondary | ICD-10-CM | POA: Diagnosis not present

## 2019-04-06 ENCOUNTER — Other Ambulatory Visit: Payer: Self-pay | Admitting: *Deleted

## 2019-04-06 DIAGNOSIS — R5381 Other malaise: Secondary | ICD-10-CM | POA: Diagnosis not present

## 2019-04-06 DIAGNOSIS — M6281 Muscle weakness (generalized): Secondary | ICD-10-CM | POA: Diagnosis not present

## 2019-04-06 DIAGNOSIS — M25552 Pain in left hip: Secondary | ICD-10-CM | POA: Diagnosis not present

## 2019-04-06 DIAGNOSIS — R262 Difficulty in walking, not elsewhere classified: Secondary | ICD-10-CM | POA: Diagnosis not present

## 2019-04-06 NOTE — Patient Outreach (Signed)
Member assessed for potential [facility] Care Management needs as a benefit of  [place] Medicare.  Member is currently receiving rehab therapy at [facility].  Member discussed in weekly telephonic IDT meeting with  facility staff, [facility] UM team, and writer.  Facility reports disposition plan remains to return to [facility] memory care unit. Mr. Chait will need to have COVID test before returning to ALF. COVID testing at [facility] will be on 04/12/19. Facility staff also reports member fell this morning without injury.  Will continue to follow until transition to ALF.   Marthenia Rolling, MSN-Ed, RN,BSN [place] Acute Care Coordinator [phone] [facility]) [phone]  (Toll free office)

## 2019-04-07 ENCOUNTER — Ambulatory Visit (INDEPENDENT_AMBULATORY_CARE_PROVIDER_SITE_OTHER): Payer: Medicare Other

## 2019-04-07 ENCOUNTER — Other Ambulatory Visit: Payer: Self-pay

## 2019-04-07 ENCOUNTER — Encounter: Payer: Self-pay | Admitting: Orthopaedic Surgery

## 2019-04-07 ENCOUNTER — Ambulatory Visit (INDEPENDENT_AMBULATORY_CARE_PROVIDER_SITE_OTHER): Payer: Medicare Other | Admitting: Physician Assistant

## 2019-04-07 DIAGNOSIS — Z96642 Presence of left artificial hip joint: Secondary | ICD-10-CM

## 2019-04-07 DIAGNOSIS — S72001D Fracture of unspecified part of neck of right femur, subsequent encounter for closed fracture with routine healing: Secondary | ICD-10-CM

## 2019-04-07 NOTE — Progress Notes (Signed)
Post-Op Visit Note   Patient: [name] [name]           Date of Birth: 1931-01-06           MRN: [id number] Visit Date: 04/07/2019 PCP: Leanna Battles, MD   Assessment & Plan:  Chief Complaint:  Chief Complaint  Patient presents with  . Left Hip - Pain   Visit Diagnoses:  1. History of left hip hemiarthroplasty   2. Closed fracture of right hip with routine healing, subsequent encounter     Plan: Patient is a pleasant 85 year old demented gentleman who presents our clinic today 6 weeks status post right hip cannulated pinning, date of surgery 02/20/2019, and 3 weeks status post left anterior approach hemi-hip arthroplasty, date of surgery 03/17/2019.  He has been at the SNF facility.  Prior to his falls he was ambulating without assistance.  He is now in a wheelchair at today's visit.  It is unsure whether he is ambulating with therapy with a walker or cane at this time.  His son who is present today was told that he fell yesterday.  He is not complaining of any increased pain.  Examination of the right hip reveals full healed surgical scar.  No evidence of infection.  Left hip exam shows a moderate sized seroma that is fluctuant in nature.  This is nontender.  He does have significant surrounding erythema.  No drainage.  In regards to the left hip, we drained this today and obtained approximately 200cc sanguinous fluid.  We have started him on Bactrim DS twice daily.  This was written in his nursing home notes.  He will apply ice as much as possible.  He will continue with physical therapy weightbearing as tolerated for both hips.  Follow-up with [place] in 1 week's time for left wound recheck.  Call with concerns or questions anytime.  Follow-Up Instructions: Return in about 1 week (around 04/14/2019) for f/u of left hip with xrays.   Orders:  Orders Placed This Encounter  Procedures  . XR HIP UNILAT W OR W/O PELVIS 2-3 VIEWS LEFT   No orders of the defined types were placed in this  encounter.   Imaging: No results found.  PMFS History: Patient Active Problem List   Diagnosis Date Noted  . Closed displaced fracture of right femoral neck ([place]) 02/19/2019  . Hip fracture ([place]) 02/19/2019  . Closed right hip fracture ([place]) 02/19/2019  . Evaluation by psychiatric service required   . Hypoxia 02/13/2018  . Edema 02/13/2018  . Hypoxemia 02/13/2018  . Dementia ([place]) 02/13/2018  . Altered mental status 02/13/2018  . Dermatitis 10/08/2015  . CHEST PAIN, ATYPICAL 04/11/2010  . Personal history of colon polyps and family history of colon cancer 12/19/2007  . Anxiety state 12/19/2007  . DEPRESSION 12/19/2007  . Bicipital tendinitis, left shoulder 12/19/2007  . ALLERGIC CONJUNCTIVITIS 12/19/2007  . Seasonal allergic rhinitis 12/19/2007  . GERD 12/19/2007  . IBS 12/19/2007   Past Medical History:  Diagnosis Date  . Allergic rhinitis, cause unspecified   . Anxiety state, unspecified   . Arthritis   . Benign neoplasm of colon   . Depressive disorder, not elsewhere classified   . Esophageal reflux   . Hyperlipidemia   . Irritable bowel syndrome   . Obstructive sleep apnea (adult) (pediatric)   . Other chronic allergic conjunctivitis   . Ulcer 1951   stomach    Family History  Problem Relation Age of Onset  . Colon cancer Father   .  Stroke Mother   . Heart attack Mother   . Colon cancer Mother   . Rectal cancer Neg Hx   . Stomach cancer Neg Hx     Past Surgical History:  Procedure Laterality Date  . ANTERIOR APPROACH HEMI HIP ARTHROPLASTY Left 03/17/2019   Procedure: ANTERIOR APPROACH HEMI HIP ARTHROPLASTY;  Surgeon: Leandrew Koyanagi, MD;  Location: [place];  Service: Orthopedics;  Laterality: Left;  . APPENDECTOMY    . CATARACT EXTRACTION W/ INTRAOCULAR LENS  IMPLANT, BILATERAL  2003  . COLONOSCOPY    . ESOPHAGOGASTRODUODENOSCOPY    . HIP PINNING,CANNULATED Right 02/20/2019   Procedure: CANNULATED HIP PINNING;  Surgeon: Leandrew Koyanagi, MD;  Location: [place];   Service: Orthopedics;  Laterality: Right;  . TONSILLECTOMY     Social History   Occupational History  . Not on file  Tobacco Use  . Smoking status: Never Smoker  . Smokeless tobacco: Never Used  Substance and Sexual Activity  . Alcohol use: No  . Drug use: No  . Sexual activity: Not on file

## 2019-04-13 ENCOUNTER — Other Ambulatory Visit: Payer: Self-pay | Admitting: *Deleted

## 2019-04-13 ENCOUNTER — Other Ambulatory Visit: Payer: Self-pay

## 2019-04-13 ENCOUNTER — Ambulatory Visit (INDEPENDENT_AMBULATORY_CARE_PROVIDER_SITE_OTHER): Payer: Medicare Other | Admitting: Orthopaedic Surgery

## 2019-04-13 DIAGNOSIS — Z96642 Presence of left artificial hip joint: Secondary | ICD-10-CM

## 2019-04-13 NOTE — Progress Notes (Signed)
Post-Op Visit Note   Patient: [name] [name]           Date of Birth: June 29, 1931           MRN: [id number] Visit Date: 04/13/2019 PCP: Leanna Battles, MD   Assessment & Plan:  Chief Complaint:  Chief Complaint  Patient presents with  . Left Hip - Wound Check   Visit Diagnoses:  1. History of left hip hemiarthroplasty     Plan: Patient is a pleasant 85 year old gentleman with dementia who presents our clinic today for follow-up of his left hip.  He is still at a SNF.  He is 4 weeks status post left hip hemiarthroplasty, date of surgery 03/17/2019.  We saw him last week where he had a ready significant sized seroma.  This was drained and he was started on Bactrim DS twice daily.  He comes in today for wound check.  He has mild.  Incisional erythema with a very small and soft seroma.  This is dramatically improved compared to last week's visit.  There is no drainage.  His incision has fully healed.  At this point, I would like to continue the Bactrim for another 10 days.  We will continue to ice the seroma.  He will follow-up with [place] in 2 weeks time is 6 weeks out from surgery for follow-up and AP pelvis lateral hip x-rays.  Call with concerns or questions in the meantime.  Follow-Up Instructions: Return in about 2 weeks (around 04/27/2019).   Orders:  No orders of the defined types were placed in this encounter.  No orders of the defined types were placed in this encounter.   Imaging: No new imaging   PMFS History: Patient Active Problem List   Diagnosis Date Noted  . Closed displaced fracture of right femoral neck ([place]) 02/19/2019  . Hip fracture ([place]) 02/19/2019  . Closed right hip fracture ([place]) 02/19/2019  . Evaluation by psychiatric service required   . Hypoxia 02/13/2018  . Edema 02/13/2018  . Hypoxemia 02/13/2018  . Dementia ([place]) 02/13/2018  . Altered mental status 02/13/2018  . Dermatitis 10/08/2015  . CHEST PAIN, ATYPICAL 04/11/2010  . Personal history of  colon polyps and family history of colon cancer 12/19/2007  . Anxiety state 12/19/2007  . DEPRESSION 12/19/2007  . Bicipital tendinitis, left shoulder 12/19/2007  . ALLERGIC CONJUNCTIVITIS 12/19/2007  . Seasonal allergic rhinitis 12/19/2007  . GERD 12/19/2007  . IBS 12/19/2007   Past Medical History:  Diagnosis Date  . Allergic rhinitis, cause unspecified   . Anxiety state, unspecified   . Arthritis   . Benign neoplasm of colon   . Depressive disorder, not elsewhere classified   . Esophageal reflux   . Hyperlipidemia   . Irritable bowel syndrome   . Obstructive sleep apnea (adult) (pediatric)   . Other chronic allergic conjunctivitis   . Ulcer 1951   stomach    Family History  Problem Relation Age of Onset  . Colon cancer Father   . Stroke Mother   . Heart attack Mother   . Colon cancer Mother   . Rectal cancer Neg Hx   . Stomach cancer Neg Hx     Past Surgical History:  Procedure Laterality Date  . ANTERIOR APPROACH HEMI HIP ARTHROPLASTY Left 03/17/2019   Procedure: ANTERIOR APPROACH HEMI HIP ARTHROPLASTY;  Surgeon: Leandrew Koyanagi, MD;  Location: [place];  Service: Orthopedics;  Laterality: Left;  . APPENDECTOMY    . CATARACT EXTRACTION W/ INTRAOCULAR LENS  IMPLANT, BILATERAL  2003  . COLONOSCOPY    . ESOPHAGOGASTRODUODENOSCOPY    . HIP PINNING,CANNULATED Right 02/20/2019   Procedure: CANNULATED HIP PINNING;  Surgeon: Leandrew Koyanagi, MD;  Location: [place];  Service: Orthopedics;  Laterality: Right;  . TONSILLECTOMY     Social History   Occupational History  . Not on file  Tobacco Use  . Smoking status: Never Smoker  . Smokeless tobacco: Never Used  Substance and Sexual Activity  . Alcohol use: No  . Drug use: No  . Sexual activity: Not on file

## 2019-04-13 NOTE — Patient Outreach (Signed)
Member assessed for potential [facility] Care Management needs as a benefit of [place] Medicare.  Member is currently receiving rehab therapy at [facility].  Member discussed in weekly telephonic IDT meeting with facility staff, [facility] UM team, and writer.  Facility reports that Mr. Gulas will return to [facility] memory care next week. Covid test was completed yesterday at [facility]. Results pending.  Will plan to sign off since member is returning to memory care at ALF. No identifiable [facility] Care Management needs at this time.    Marthenia Rolling, MSN-Ed, RN,BSN [place] Acute Care Coordinator [phone] [facility]) [phone]  (Toll free office)

## 2019-04-19 DIAGNOSIS — F0390 Unspecified dementia without behavioral disturbance: Secondary | ICD-10-CM | POA: Diagnosis not present

## 2019-04-19 DIAGNOSIS — R451 Restlessness and agitation: Secondary | ICD-10-CM | POA: Diagnosis not present

## 2019-04-19 DIAGNOSIS — S72002D Fracture of unspecified part of neck of left femur, subsequent encounter for closed fracture with routine healing: Secondary | ICD-10-CM | POA: Diagnosis not present

## 2019-04-19 DIAGNOSIS — F419 Anxiety disorder, unspecified: Secondary | ICD-10-CM | POA: Diagnosis not present

## 2019-04-19 DIAGNOSIS — R296 Repeated falls: Secondary | ICD-10-CM | POA: Diagnosis not present

## 2019-04-19 DIAGNOSIS — S72001D Fracture of unspecified part of neck of right femur, subsequent encounter for closed fracture with routine healing: Secondary | ICD-10-CM | POA: Diagnosis not present

## 2019-04-19 DIAGNOSIS — M6281 Muscle weakness (generalized): Secondary | ICD-10-CM | POA: Diagnosis not present

## 2019-04-21 ENCOUNTER — Other Ambulatory Visit: Payer: Self-pay | Admitting: *Deleted

## 2019-04-21 NOTE — Patient Outreach (Signed)
Member assessed for potential [facility] Care Management needs as a benefit of [place] Medicare.  Verifed in Patient [name] [name] that [name] discharged from [facility] SNF on 04/20/19.  As previously planned, [name] returned to [facility] memory care.  Writer to sign off. No identifiable [facility] Care Management needs at this time.  Marthenia Rolling, MSN-Ed, RN,BSN [place] Acute Care Coordinator [phone] [facility]) [phone]  (Toll free office)

## 2019-04-24 DIAGNOSIS — Z09 Encounter for follow-up examination after completed treatment for conditions other than malignant neoplasm: Secondary | ICD-10-CM | POA: Diagnosis not present

## 2019-04-24 DIAGNOSIS — E7801 Familial hypercholesterolemia: Secondary | ICD-10-CM | POA: Diagnosis not present

## 2019-04-24 DIAGNOSIS — F419 Anxiety disorder, unspecified: Secondary | ICD-10-CM | POA: Diagnosis not present

## 2019-04-24 DIAGNOSIS — G301 Alzheimer's disease with late onset: Secondary | ICD-10-CM | POA: Diagnosis not present

## 2019-04-24 DIAGNOSIS — Z79899 Other long term (current) drug therapy: Secondary | ICD-10-CM | POA: Diagnosis not present

## 2019-04-24 DIAGNOSIS — S72002A Fracture of unspecified part of neck of left femur, initial encounter for closed fracture: Secondary | ICD-10-CM | POA: Diagnosis not present

## 2019-04-26 DIAGNOSIS — G4733 Obstructive sleep apnea (adult) (pediatric): Secondary | ICD-10-CM | POA: Diagnosis not present

## 2019-04-26 DIAGNOSIS — K589 Irritable bowel syndrome without diarrhea: Secondary | ICD-10-CM | POA: Diagnosis not present

## 2019-04-26 DIAGNOSIS — S72001D Fracture of unspecified part of neck of right femur, subsequent encounter for closed fracture with routine healing: Secondary | ICD-10-CM | POA: Diagnosis not present

## 2019-04-26 DIAGNOSIS — F339 Major depressive disorder, recurrent, unspecified: Secondary | ICD-10-CM | POA: Diagnosis not present

## 2019-04-26 DIAGNOSIS — Z9181 History of falling: Secondary | ICD-10-CM | POA: Diagnosis not present

## 2019-04-26 DIAGNOSIS — M1990 Unspecified osteoarthritis, unspecified site: Secondary | ICD-10-CM | POA: Diagnosis not present

## 2019-04-26 DIAGNOSIS — E785 Hyperlipidemia, unspecified: Secondary | ICD-10-CM | POA: Diagnosis not present

## 2019-04-26 DIAGNOSIS — F0390 Unspecified dementia without behavioral disturbance: Secondary | ICD-10-CM | POA: Diagnosis not present

## 2019-04-26 DIAGNOSIS — K219 Gastro-esophageal reflux disease without esophagitis: Secondary | ICD-10-CM | POA: Diagnosis not present

## 2019-04-26 DIAGNOSIS — F419 Anxiety disorder, unspecified: Secondary | ICD-10-CM | POA: Diagnosis not present

## 2019-04-26 DIAGNOSIS — S72002D Fracture of unspecified part of neck of left femur, subsequent encounter for closed fracture with routine healing: Secondary | ICD-10-CM | POA: Diagnosis not present

## 2019-04-26 DIAGNOSIS — Z87440 Personal history of urinary (tract) infections: Secondary | ICD-10-CM | POA: Diagnosis not present

## 2019-04-26 DIAGNOSIS — I10 Essential (primary) hypertension: Secondary | ICD-10-CM | POA: Diagnosis not present

## 2019-04-26 DIAGNOSIS — D649 Anemia, unspecified: Secondary | ICD-10-CM | POA: Diagnosis not present

## 2019-04-26 DIAGNOSIS — Z86010 Personal history of colonic polyps: Secondary | ICD-10-CM | POA: Diagnosis not present

## 2019-04-26 DIAGNOSIS — J302 Other seasonal allergic rhinitis: Secondary | ICD-10-CM | POA: Diagnosis not present

## 2019-04-26 DIAGNOSIS — Z7982 Long term (current) use of aspirin: Secondary | ICD-10-CM | POA: Diagnosis not present

## 2019-04-26 DIAGNOSIS — Z96642 Presence of left artificial hip joint: Secondary | ICD-10-CM | POA: Diagnosis not present

## 2019-04-27 ENCOUNTER — Telehealth: Payer: Self-pay | Admitting: Orthopaedic Surgery

## 2019-04-27 ENCOUNTER — Ambulatory Visit: Payer: Medicare Other | Admitting: Orthopaedic Surgery

## 2019-04-27 DIAGNOSIS — Z20828 Contact with and (suspected) exposure to other viral communicable diseases: Secondary | ICD-10-CM | POA: Diagnosis not present

## 2019-04-27 NOTE — Telephone Encounter (Signed)
IC verbal given.  

## 2019-04-27 NOTE — Telephone Encounter (Signed)
Received voicemail message from [place] (PT) with [facility] needing verbal orders for HHPT 1 Wk 1, 2 Wk 4 and 1 Wk 4   The number to contact Annabelle Harman is [phone]

## 2019-04-27 NOTE — Telephone Encounter (Signed)
Gracee with [facility] called to request an RX for a 2-wheeled wheelchair be faxed to them at [phone]  CB#[phone].  Thank you.

## 2019-04-28 NOTE — Telephone Encounter (Signed)
Faxed Rx for WALKER to provided number

## 2019-05-01 DIAGNOSIS — D649 Anemia, unspecified: Secondary | ICD-10-CM | POA: Diagnosis not present

## 2019-05-01 DIAGNOSIS — F0390 Unspecified dementia without behavioral disturbance: Secondary | ICD-10-CM | POA: Diagnosis not present

## 2019-05-01 DIAGNOSIS — G4733 Obstructive sleep apnea (adult) (pediatric): Secondary | ICD-10-CM | POA: Diagnosis not present

## 2019-05-01 DIAGNOSIS — S72002D Fracture of unspecified part of neck of left femur, subsequent encounter for closed fracture with routine healing: Secondary | ICD-10-CM | POA: Diagnosis not present

## 2019-05-01 DIAGNOSIS — I10 Essential (primary) hypertension: Secondary | ICD-10-CM | POA: Diagnosis not present

## 2019-05-01 DIAGNOSIS — S72001D Fracture of unspecified part of neck of right femur, subsequent encounter for closed fracture with routine healing: Secondary | ICD-10-CM | POA: Diagnosis not present

## 2019-05-02 DIAGNOSIS — S72001D Fracture of unspecified part of neck of right femur, subsequent encounter for closed fracture with routine healing: Secondary | ICD-10-CM | POA: Diagnosis not present

## 2019-05-02 DIAGNOSIS — D649 Anemia, unspecified: Secondary | ICD-10-CM | POA: Diagnosis not present

## 2019-05-02 DIAGNOSIS — F0390 Unspecified dementia without behavioral disturbance: Secondary | ICD-10-CM | POA: Diagnosis not present

## 2019-05-02 DIAGNOSIS — S72002D Fracture of unspecified part of neck of left femur, subsequent encounter for closed fracture with routine healing: Secondary | ICD-10-CM | POA: Diagnosis not present

## 2019-05-02 DIAGNOSIS — G4733 Obstructive sleep apnea (adult) (pediatric): Secondary | ICD-10-CM | POA: Diagnosis not present

## 2019-05-02 DIAGNOSIS — I10 Essential (primary) hypertension: Secondary | ICD-10-CM | POA: Diagnosis not present

## 2019-05-04 DIAGNOSIS — I10 Essential (primary) hypertension: Secondary | ICD-10-CM | POA: Diagnosis not present

## 2019-05-04 DIAGNOSIS — G4733 Obstructive sleep apnea (adult) (pediatric): Secondary | ICD-10-CM | POA: Diagnosis not present

## 2019-05-04 DIAGNOSIS — S72001D Fracture of unspecified part of neck of right femur, subsequent encounter for closed fracture with routine healing: Secondary | ICD-10-CM | POA: Diagnosis not present

## 2019-05-04 DIAGNOSIS — S72002D Fracture of unspecified part of neck of left femur, subsequent encounter for closed fracture with routine healing: Secondary | ICD-10-CM | POA: Diagnosis not present

## 2019-05-04 DIAGNOSIS — F0390 Unspecified dementia without behavioral disturbance: Secondary | ICD-10-CM | POA: Diagnosis not present

## 2019-05-04 DIAGNOSIS — D649 Anemia, unspecified: Secondary | ICD-10-CM | POA: Diagnosis not present

## 2019-05-08 ENCOUNTER — Telehealth: Payer: Self-pay | Admitting: Orthopaedic Surgery

## 2019-05-08 NOTE — Telephone Encounter (Signed)
Ok for order?  

## 2019-05-08 NOTE — Telephone Encounter (Signed)
yes

## 2019-05-08 NOTE — Telephone Encounter (Signed)
rx completed and faxed to [facility]

## 2019-05-08 NOTE — Telephone Encounter (Signed)
Nancy-(OT) with [facility] called needing Rx sent to [facility] for a 3 in 1 commode. The number to contact Izora Gala is [phone]

## 2019-05-10 DIAGNOSIS — F0390 Unspecified dementia without behavioral disturbance: Secondary | ICD-10-CM | POA: Diagnosis not present

## 2019-05-10 DIAGNOSIS — I10 Essential (primary) hypertension: Secondary | ICD-10-CM | POA: Diagnosis not present

## 2019-05-10 DIAGNOSIS — S72002D Fracture of unspecified part of neck of left femur, subsequent encounter for closed fracture with routine healing: Secondary | ICD-10-CM | POA: Diagnosis not present

## 2019-05-10 DIAGNOSIS — S72001D Fracture of unspecified part of neck of right femur, subsequent encounter for closed fracture with routine healing: Secondary | ICD-10-CM | POA: Diagnosis not present

## 2019-05-10 DIAGNOSIS — G4733 Obstructive sleep apnea (adult) (pediatric): Secondary | ICD-10-CM | POA: Diagnosis not present

## 2019-05-10 DIAGNOSIS — D649 Anemia, unspecified: Secondary | ICD-10-CM | POA: Diagnosis not present

## 2019-05-11 DIAGNOSIS — I10 Essential (primary) hypertension: Secondary | ICD-10-CM | POA: Diagnosis not present

## 2019-05-11 DIAGNOSIS — S72001D Fracture of unspecified part of neck of right femur, subsequent encounter for closed fracture with routine healing: Secondary | ICD-10-CM | POA: Diagnosis not present

## 2019-05-11 DIAGNOSIS — S72002D Fracture of unspecified part of neck of left femur, subsequent encounter for closed fracture with routine healing: Secondary | ICD-10-CM | POA: Diagnosis not present

## 2019-05-11 DIAGNOSIS — G4733 Obstructive sleep apnea (adult) (pediatric): Secondary | ICD-10-CM | POA: Diagnosis not present

## 2019-05-11 DIAGNOSIS — D649 Anemia, unspecified: Secondary | ICD-10-CM | POA: Diagnosis not present

## 2019-05-11 DIAGNOSIS — F0390 Unspecified dementia without behavioral disturbance: Secondary | ICD-10-CM | POA: Diagnosis not present

## 2019-05-12 ENCOUNTER — Ambulatory Visit (INDEPENDENT_AMBULATORY_CARE_PROVIDER_SITE_OTHER): Payer: Medicare Other | Admitting: Physician Assistant

## 2019-05-12 ENCOUNTER — Ambulatory Visit: Payer: Medicare Other

## 2019-05-12 ENCOUNTER — Encounter: Payer: Self-pay | Admitting: Orthopaedic Surgery

## 2019-05-12 ENCOUNTER — Ambulatory Visit: Payer: Self-pay

## 2019-05-12 VITALS — Ht 74.0 in | Wt 143.0 lb

## 2019-05-12 DIAGNOSIS — Z96642 Presence of left artificial hip joint: Secondary | ICD-10-CM

## 2019-05-12 DIAGNOSIS — M25551 Pain in right hip: Secondary | ICD-10-CM | POA: Diagnosis not present

## 2019-05-12 DIAGNOSIS — G4733 Obstructive sleep apnea (adult) (pediatric): Secondary | ICD-10-CM | POA: Diagnosis not present

## 2019-05-12 DIAGNOSIS — S72001D Fracture of unspecified part of neck of right femur, subsequent encounter for closed fracture with routine healing: Secondary | ICD-10-CM | POA: Diagnosis not present

## 2019-05-12 DIAGNOSIS — S72002D Fracture of unspecified part of neck of left femur, subsequent encounter for closed fracture with routine healing: Secondary | ICD-10-CM | POA: Diagnosis not present

## 2019-05-12 DIAGNOSIS — D649 Anemia, unspecified: Secondary | ICD-10-CM | POA: Diagnosis not present

## 2019-05-12 DIAGNOSIS — F0390 Unspecified dementia without behavioral disturbance: Secondary | ICD-10-CM | POA: Diagnosis not present

## 2019-05-12 DIAGNOSIS — I10 Essential (primary) hypertension: Secondary | ICD-10-CM | POA: Diagnosis not present

## 2019-05-12 NOTE — Progress Notes (Signed)
Post-Op Visit Note   Patient: [name] [name]           Date of Birth: [birth year]           MRN: [id number] Visit Date: 05/12/2019 PCP: Leanna Battles, MD   Assessment & Plan:  Chief Complaint:  Chief Complaint  Patient presents with  . Left Hip - Routine Post Op    03/17/19 left hip hemi   Visit Diagnoses:  1. History of left hip hemiarthroplasty     Plan: Patient is a pleasant 85 year old gentleman from [place] who comes in today for follow-up of his left hip.  He is approximately 6 weeks status post left hip hemiarthroplasty, date of surgery 03/17/2019   and about 2-1/2 months status post right hip cannulated pinning, date of surgery 02/20/2019.  Doing well with both hips and without complaints.  Examination of the right hip reveals fully healed surgical scars.  Left hip shows a fully healed surgical scars without infection or cellulitis.  X-rays of the left hip show a well-seated prosthesis without complication.  Right hip x-rays show bony consolidation the fracture site.  At this point, he will continue with activity and physical therapy as tolerated.  He will follow-up with [place] in 6 weeks time for repeat evaluation and AP pelvis lateral hip x-rays both sides.  Call with concerns or questions in the meantime.  Follow-Up Instructions: Return in about 6 weeks (around 06/23/2019).   Orders:  Orders Placed This Encounter  Procedures  . XR HIP UNILAT W OR W/O PELVIS 2-3 VIEWS LEFT  . XR HIP UNILAT W OR W/O PELVIS 2-3 VIEWS RIGHT   No orders of the defined types were placed in this encounter.   Imaging: Xr Hip Unilat W Or W/o Pelvis 2-3 Views Left  Result Date: 05/12/2019 Ex-rays demonstrate bony consolidation to the fracture site  Xr Hip Unilat W Or W/o Pelvis 2-3 Views Right  Result Date: 05/12/2019 X-rays reveal a well-seated prosthesis without complication   PMFS History: Patient Active Problem List   Diagnosis Date Noted  . Closed displaced fracture of right  femoral neck ([place]) 02/19/2019  . Hip fracture ([place]) 02/19/2019  . Closed right hip fracture ([place]) 02/19/2019  . Evaluation by psychiatric service required   . Hypoxia 02/13/2018  . Edema 02/13/2018  . Hypoxemia 02/13/2018  . Dementia ([place]) 02/13/2018  . Altered mental status 02/13/2018  . Dermatitis 10/08/2015  . CHEST PAIN, ATYPICAL 04/11/2010  . Personal history of colon polyps and family history of colon cancer 12/19/2007  . Anxiety state 12/19/2007  . DEPRESSION 12/19/2007  . Bicipital tendinitis, left shoulder 12/19/2007  . ALLERGIC CONJUNCTIVITIS 12/19/2007  . Seasonal allergic rhinitis 12/19/2007  . GERD 12/19/2007  . IBS 12/19/2007   Past Medical History:  Diagnosis Date  . Allergic rhinitis, cause unspecified   . Anxiety state, unspecified   . Arthritis   . Benign neoplasm of colon   . Depressive disorder, not elsewhere classified   . Esophageal reflux   . Hyperlipidemia   . Irritable bowel syndrome   . Obstructive sleep apnea (adult) (pediatric)   . Other chronic allergic conjunctivitis   . Ulcer 1951   stomach    Family History  Problem Relation Age of Onset  . Colon cancer Father   . Stroke Mother   . Heart attack Mother   . Colon cancer Mother   . Rectal cancer Neg Hx   . Stomach cancer Neg Hx     Past  Surgical History:  Procedure Laterality Date  . ANTERIOR APPROACH HEMI HIP ARTHROPLASTY Left 03/17/2019   Procedure: ANTERIOR APPROACH HEMI HIP ARTHROPLASTY;  Surgeon: Leandrew Koyanagi, MD;  Location: [place];  Service: Orthopedics;  Laterality: Left;  . APPENDECTOMY    . CATARACT EXTRACTION W/ INTRAOCULAR LENS  IMPLANT, BILATERAL  2003  . COLONOSCOPY    . ESOPHAGOGASTRODUODENOSCOPY    . HIP PINNING,CANNULATED Right 02/20/2019   Procedure: CANNULATED HIP PINNING;  Surgeon: Leandrew Koyanagi, MD;  Location: [place];  Service: Orthopedics;  Laterality: Right;  . TONSILLECTOMY     Social History   Occupational History  . Not on file  Tobacco Use  . Smoking  status: Never Smoker  . Smokeless tobacco: Never Used  Substance and Sexual Activity  . Alcohol use: No  . Drug use: No  . Sexual activity: Not on file

## 2019-05-17 DIAGNOSIS — D649 Anemia, unspecified: Secondary | ICD-10-CM | POA: Diagnosis not present

## 2019-05-17 DIAGNOSIS — F0390 Unspecified dementia without behavioral disturbance: Secondary | ICD-10-CM | POA: Diagnosis not present

## 2019-05-17 DIAGNOSIS — S72002D Fracture of unspecified part of neck of left femur, subsequent encounter for closed fracture with routine healing: Secondary | ICD-10-CM | POA: Diagnosis not present

## 2019-05-17 DIAGNOSIS — G4733 Obstructive sleep apnea (adult) (pediatric): Secondary | ICD-10-CM | POA: Diagnosis not present

## 2019-05-17 DIAGNOSIS — S72001D Fracture of unspecified part of neck of right femur, subsequent encounter for closed fracture with routine healing: Secondary | ICD-10-CM | POA: Diagnosis not present

## 2019-05-17 DIAGNOSIS — I10 Essential (primary) hypertension: Secondary | ICD-10-CM | POA: Diagnosis not present

## 2019-05-18 DIAGNOSIS — D649 Anemia, unspecified: Secondary | ICD-10-CM | POA: Diagnosis not present

## 2019-05-18 DIAGNOSIS — G4733 Obstructive sleep apnea (adult) (pediatric): Secondary | ICD-10-CM | POA: Diagnosis not present

## 2019-05-18 DIAGNOSIS — S72001D Fracture of unspecified part of neck of right femur, subsequent encounter for closed fracture with routine healing: Secondary | ICD-10-CM | POA: Diagnosis not present

## 2019-05-18 DIAGNOSIS — I10 Essential (primary) hypertension: Secondary | ICD-10-CM | POA: Diagnosis not present

## 2019-05-18 DIAGNOSIS — S72002D Fracture of unspecified part of neck of left femur, subsequent encounter for closed fracture with routine healing: Secondary | ICD-10-CM | POA: Diagnosis not present

## 2019-05-18 DIAGNOSIS — F0390 Unspecified dementia without behavioral disturbance: Secondary | ICD-10-CM | POA: Diagnosis not present

## 2019-05-22 ENCOUNTER — Telehealth: Payer: Self-pay

## 2019-05-22 NOTE — Telephone Encounter (Signed)
Ok for this? 

## 2019-05-22 NOTE — Telephone Encounter (Signed)
Requesting an order for 3 in 1 commode be faxed to [facility] for pt. Fax# is [phone].

## 2019-05-23 DIAGNOSIS — I10 Essential (primary) hypertension: Secondary | ICD-10-CM | POA: Diagnosis not present

## 2019-05-23 DIAGNOSIS — G4733 Obstructive sleep apnea (adult) (pediatric): Secondary | ICD-10-CM | POA: Diagnosis not present

## 2019-05-23 DIAGNOSIS — D649 Anemia, unspecified: Secondary | ICD-10-CM | POA: Diagnosis not present

## 2019-05-23 DIAGNOSIS — S72001D Fracture of unspecified part of neck of right femur, subsequent encounter for closed fracture with routine healing: Secondary | ICD-10-CM | POA: Diagnosis not present

## 2019-05-23 DIAGNOSIS — F0390 Unspecified dementia without behavioral disturbance: Secondary | ICD-10-CM | POA: Diagnosis not present

## 2019-05-23 DIAGNOSIS — S72002D Fracture of unspecified part of neck of left femur, subsequent encounter for closed fracture with routine healing: Secondary | ICD-10-CM | POA: Diagnosis not present

## 2019-05-23 NOTE — Telephone Encounter (Signed)
yes

## 2019-05-23 NOTE — Telephone Encounter (Signed)
faxed

## 2019-05-25 DIAGNOSIS — D649 Anemia, unspecified: Secondary | ICD-10-CM | POA: Diagnosis not present

## 2019-05-25 DIAGNOSIS — F0390 Unspecified dementia without behavioral disturbance: Secondary | ICD-10-CM | POA: Diagnosis not present

## 2019-05-25 DIAGNOSIS — S72001D Fracture of unspecified part of neck of right femur, subsequent encounter for closed fracture with routine healing: Secondary | ICD-10-CM | POA: Diagnosis not present

## 2019-05-25 DIAGNOSIS — I10 Essential (primary) hypertension: Secondary | ICD-10-CM | POA: Diagnosis not present

## 2019-05-25 DIAGNOSIS — G4733 Obstructive sleep apnea (adult) (pediatric): Secondary | ICD-10-CM | POA: Diagnosis not present

## 2019-05-25 DIAGNOSIS — S72002D Fracture of unspecified part of neck of left femur, subsequent encounter for closed fracture with routine healing: Secondary | ICD-10-CM | POA: Diagnosis not present

## 2019-05-26 DIAGNOSIS — M1990 Unspecified osteoarthritis, unspecified site: Secondary | ICD-10-CM | POA: Diagnosis not present

## 2019-05-26 DIAGNOSIS — Z87440 Personal history of urinary (tract) infections: Secondary | ICD-10-CM | POA: Diagnosis not present

## 2019-05-26 DIAGNOSIS — S72001D Fracture of unspecified part of neck of right femur, subsequent encounter for closed fracture with routine healing: Secondary | ICD-10-CM | POA: Diagnosis not present

## 2019-05-26 DIAGNOSIS — F339 Major depressive disorder, recurrent, unspecified: Secondary | ICD-10-CM | POA: Diagnosis not present

## 2019-05-26 DIAGNOSIS — I10 Essential (primary) hypertension: Secondary | ICD-10-CM | POA: Diagnosis not present

## 2019-05-26 DIAGNOSIS — K589 Irritable bowel syndrome without diarrhea: Secondary | ICD-10-CM | POA: Diagnosis not present

## 2019-05-26 DIAGNOSIS — D649 Anemia, unspecified: Secondary | ICD-10-CM | POA: Diagnosis not present

## 2019-05-26 DIAGNOSIS — K219 Gastro-esophageal reflux disease without esophagitis: Secondary | ICD-10-CM | POA: Diagnosis not present

## 2019-05-26 DIAGNOSIS — Z86010 Personal history of colonic polyps: Secondary | ICD-10-CM | POA: Diagnosis not present

## 2019-05-26 DIAGNOSIS — Z7982 Long term (current) use of aspirin: Secondary | ICD-10-CM | POA: Diagnosis not present

## 2019-05-26 DIAGNOSIS — E785 Hyperlipidemia, unspecified: Secondary | ICD-10-CM | POA: Diagnosis not present

## 2019-05-26 DIAGNOSIS — S72002D Fracture of unspecified part of neck of left femur, subsequent encounter for closed fracture with routine healing: Secondary | ICD-10-CM | POA: Diagnosis not present

## 2019-05-26 DIAGNOSIS — Z96642 Presence of left artificial hip joint: Secondary | ICD-10-CM | POA: Diagnosis not present

## 2019-05-26 DIAGNOSIS — J302 Other seasonal allergic rhinitis: Secondary | ICD-10-CM | POA: Diagnosis not present

## 2019-05-26 DIAGNOSIS — F419 Anxiety disorder, unspecified: Secondary | ICD-10-CM | POA: Diagnosis not present

## 2019-05-26 DIAGNOSIS — G4733 Obstructive sleep apnea (adult) (pediatric): Secondary | ICD-10-CM | POA: Diagnosis not present

## 2019-05-26 DIAGNOSIS — Z9181 History of falling: Secondary | ICD-10-CM | POA: Diagnosis not present

## 2019-05-26 DIAGNOSIS — F0390 Unspecified dementia without behavioral disturbance: Secondary | ICD-10-CM | POA: Diagnosis not present

## 2019-05-29 DIAGNOSIS — S72002D Fracture of unspecified part of neck of left femur, subsequent encounter for closed fracture with routine healing: Secondary | ICD-10-CM | POA: Diagnosis not present

## 2019-05-29 DIAGNOSIS — G4733 Obstructive sleep apnea (adult) (pediatric): Secondary | ICD-10-CM | POA: Diagnosis not present

## 2019-05-29 DIAGNOSIS — S72001D Fracture of unspecified part of neck of right femur, subsequent encounter for closed fracture with routine healing: Secondary | ICD-10-CM | POA: Diagnosis not present

## 2019-05-29 DIAGNOSIS — I10 Essential (primary) hypertension: Secondary | ICD-10-CM | POA: Diagnosis not present

## 2019-05-29 DIAGNOSIS — F0390 Unspecified dementia without behavioral disturbance: Secondary | ICD-10-CM | POA: Diagnosis not present

## 2019-05-29 DIAGNOSIS — D649 Anemia, unspecified: Secondary | ICD-10-CM | POA: Diagnosis not present

## 2019-06-05 DIAGNOSIS — S72002D Fracture of unspecified part of neck of left femur, subsequent encounter for closed fracture with routine healing: Secondary | ICD-10-CM | POA: Diagnosis not present

## 2019-06-05 DIAGNOSIS — G4733 Obstructive sleep apnea (adult) (pediatric): Secondary | ICD-10-CM | POA: Diagnosis not present

## 2019-06-05 DIAGNOSIS — S72001D Fracture of unspecified part of neck of right femur, subsequent encounter for closed fracture with routine healing: Secondary | ICD-10-CM | POA: Diagnosis not present

## 2019-06-05 DIAGNOSIS — F0390 Unspecified dementia without behavioral disturbance: Secondary | ICD-10-CM | POA: Diagnosis not present

## 2019-06-05 DIAGNOSIS — Z136 Encounter for screening for cardiovascular disorders: Secondary | ICD-10-CM | POA: Diagnosis not present

## 2019-06-05 DIAGNOSIS — M79605 Pain in left leg: Secondary | ICD-10-CM | POA: Diagnosis not present

## 2019-06-05 DIAGNOSIS — I10 Essential (primary) hypertension: Secondary | ICD-10-CM | POA: Diagnosis not present

## 2019-06-05 DIAGNOSIS — R609 Edema, unspecified: Secondary | ICD-10-CM | POA: Diagnosis not present

## 2019-06-05 DIAGNOSIS — Z0000 Encounter for general adult medical examination without abnormal findings: Secondary | ICD-10-CM | POA: Diagnosis not present

## 2019-06-05 DIAGNOSIS — D649 Anemia, unspecified: Secondary | ICD-10-CM | POA: Diagnosis not present

## 2019-06-05 DIAGNOSIS — Z79899 Other long term (current) drug therapy: Secondary | ICD-10-CM | POA: Diagnosis not present

## 2019-06-14 DIAGNOSIS — I739 Peripheral vascular disease, unspecified: Secondary | ICD-10-CM | POA: Diagnosis not present

## 2019-06-14 DIAGNOSIS — L84 Corns and callosities: Secondary | ICD-10-CM | POA: Diagnosis not present

## 2019-06-14 DIAGNOSIS — B351 Tinea unguium: Secondary | ICD-10-CM | POA: Diagnosis not present

## 2019-06-15 DIAGNOSIS — S72001D Fracture of unspecified part of neck of right femur, subsequent encounter for closed fracture with routine healing: Secondary | ICD-10-CM | POA: Diagnosis not present

## 2019-06-15 DIAGNOSIS — F0390 Unspecified dementia without behavioral disturbance: Secondary | ICD-10-CM | POA: Diagnosis not present

## 2019-06-15 DIAGNOSIS — S72002D Fracture of unspecified part of neck of left femur, subsequent encounter for closed fracture with routine healing: Secondary | ICD-10-CM | POA: Diagnosis not present

## 2019-06-15 DIAGNOSIS — D649 Anemia, unspecified: Secondary | ICD-10-CM | POA: Diagnosis not present

## 2019-06-15 DIAGNOSIS — G4733 Obstructive sleep apnea (adult) (pediatric): Secondary | ICD-10-CM | POA: Diagnosis not present

## 2019-06-15 DIAGNOSIS — I10 Essential (primary) hypertension: Secondary | ICD-10-CM | POA: Diagnosis not present

## 2019-06-19 DIAGNOSIS — Z20828 Contact with and (suspected) exposure to other viral communicable diseases: Secondary | ICD-10-CM | POA: Diagnosis not present

## 2019-06-20 DIAGNOSIS — G4733 Obstructive sleep apnea (adult) (pediatric): Secondary | ICD-10-CM | POA: Diagnosis not present

## 2019-06-20 DIAGNOSIS — F0390 Unspecified dementia without behavioral disturbance: Secondary | ICD-10-CM | POA: Diagnosis not present

## 2019-06-20 DIAGNOSIS — I10 Essential (primary) hypertension: Secondary | ICD-10-CM | POA: Diagnosis not present

## 2019-06-20 DIAGNOSIS — S72002D Fracture of unspecified part of neck of left femur, subsequent encounter for closed fracture with routine healing: Secondary | ICD-10-CM | POA: Diagnosis not present

## 2019-06-20 DIAGNOSIS — D649 Anemia, unspecified: Secondary | ICD-10-CM | POA: Diagnosis not present

## 2019-06-20 DIAGNOSIS — S72001D Fracture of unspecified part of neck of right femur, subsequent encounter for closed fracture with routine healing: Secondary | ICD-10-CM | POA: Diagnosis not present

## 2019-06-22 DIAGNOSIS — M25552 Pain in left hip: Secondary | ICD-10-CM | POA: Diagnosis not present

## 2019-06-22 DIAGNOSIS — Z9181 History of falling: Secondary | ICD-10-CM | POA: Diagnosis not present

## 2019-06-22 DIAGNOSIS — R488 Other symbolic dysfunctions: Secondary | ICD-10-CM | POA: Diagnosis not present

## 2019-06-22 DIAGNOSIS — M6281 Muscle weakness (generalized): Secondary | ICD-10-CM | POA: Diagnosis not present

## 2019-06-22 DIAGNOSIS — Z4789 Encounter for other orthopedic aftercare: Secondary | ICD-10-CM | POA: Diagnosis not present

## 2019-06-22 DIAGNOSIS — R278 Other lack of coordination: Secondary | ICD-10-CM | POA: Diagnosis not present

## 2019-06-22 DIAGNOSIS — Z96643 Presence of artificial hip joint, bilateral: Secondary | ICD-10-CM | POA: Diagnosis not present

## 2019-06-23 ENCOUNTER — Encounter: Payer: Self-pay | Admitting: Orthopaedic Surgery

## 2019-06-23 ENCOUNTER — Ambulatory Visit (INDEPENDENT_AMBULATORY_CARE_PROVIDER_SITE_OTHER): Payer: Medicare Other

## 2019-06-23 ENCOUNTER — Other Ambulatory Visit: Payer: Self-pay

## 2019-06-23 ENCOUNTER — Ambulatory Visit: Payer: Self-pay

## 2019-06-23 ENCOUNTER — Ambulatory Visit (INDEPENDENT_AMBULATORY_CARE_PROVIDER_SITE_OTHER): Payer: Medicare Other | Admitting: Orthopaedic Surgery

## 2019-06-23 DIAGNOSIS — M25551 Pain in right hip: Secondary | ICD-10-CM | POA: Diagnosis not present

## 2019-06-23 DIAGNOSIS — M25552 Pain in left hip: Secondary | ICD-10-CM | POA: Diagnosis not present

## 2019-06-23 DIAGNOSIS — Z4789 Encounter for other orthopedic aftercare: Secondary | ICD-10-CM | POA: Diagnosis not present

## 2019-06-23 DIAGNOSIS — Z9181 History of falling: Secondary | ICD-10-CM | POA: Diagnosis not present

## 2019-06-23 DIAGNOSIS — Z96642 Presence of left artificial hip joint: Secondary | ICD-10-CM | POA: Diagnosis not present

## 2019-06-23 DIAGNOSIS — R278 Other lack of coordination: Secondary | ICD-10-CM | POA: Diagnosis not present

## 2019-06-23 DIAGNOSIS — R488 Other symbolic dysfunctions: Secondary | ICD-10-CM | POA: Diagnosis not present

## 2019-06-23 DIAGNOSIS — M6281 Muscle weakness (generalized): Secondary | ICD-10-CM | POA: Diagnosis not present

## 2019-06-23 NOTE — Progress Notes (Signed)
Post-Op Visit Note   Patient: [name] [name]           Date of Birth: 09/18/1930           MRN: [id number] Visit Date: 06/23/2019 PCP: Leanna Battles, MD   Assessment & Plan:  Chief Complaint:  Chief Complaint  Patient presents with  . Left Hip - Routine Post Op, Follow-up   Visit Diagnoses:  1. History of left hip hemiarthroplasty   2. Pain in right hip     Plan: Patient is a pleasant 85 year old gentleman who comes in today with his brother.  He is currently residing at [facility].  He is 3 months status post left hip hemiarthroplasty 03/17/2019 and 4 months status post right hip cannulated pinning 02/20/2019.  He appears to be doing well.  His brother notes that he has been ambulating without the assistance of a cane or walker.  He does come in today in a wheelchair as it was a long distance to walk from the waiting room.  Examination of his hips reveal painless logroll's.  He does have moderate swelling to the entire left lower extremity.  Calf is soft and nontender.  Negative Homans.  He is neurovascularly intact distally.  At this point, his fracture on the right has nearly healed.  We will allow him to continue weightbearing as tolerated and continue with formal physical therapy.  He will follow-up with [place] in 2 months time for repeat evaluation and x-rays of both hips.  I am hopeful that we will be able to release him from his right hip fracture at that point.  Follow-Up Instructions: Return in about 2 months (around 08/23/2019).   Orders:  Orders Placed This Encounter  Procedures  . XR HIP UNILAT W OR W/O PELVIS 2-3 VIEWS LEFT  . XR HIPS BILAT W OR W/O PELVIS 2V   No orders of the defined types were placed in this encounter.   Imaging: Xr Hips Bilat W Or W/o Pelvis 2v  Result Date: 06/23/2019 X-rays of the left hip reveal a well-seated prosthesis without complication.  Right hip x-rays demonstrate stable alignment with a nearly healed fracture   PMFS History:  Patient Active Problem List   Diagnosis Date Noted  . Closed displaced fracture of right femoral neck ([place]) 02/19/2019  . Hip fracture ([place]) 02/19/2019  . Closed right hip fracture ([place]) 02/19/2019  . Evaluation by psychiatric service required   . Hypoxia 02/13/2018  . Edema 02/13/2018  . Hypoxemia 02/13/2018  . Dementia ([place]) 02/13/2018  . Altered mental status 02/13/2018  . Dermatitis 10/08/2015  . CHEST PAIN, ATYPICAL 04/11/2010  . Personal history of colon polyps and family history of colon cancer 12/19/2007  . Anxiety state 12/19/2007  . DEPRESSION 12/19/2007  . Bicipital tendinitis, left shoulder 12/19/2007  . ALLERGIC CONJUNCTIVITIS 12/19/2007  . Seasonal allergic rhinitis 12/19/2007  . GERD 12/19/2007  . IBS 12/19/2007   Past Medical History:  Diagnosis Date  . Allergic rhinitis, cause unspecified   . Anxiety state, unspecified   . Arthritis   . Benign neoplasm of colon   . Depressive disorder, not elsewhere classified   . Esophageal reflux   . Hyperlipidemia   . Irritable bowel syndrome   . Obstructive sleep apnea (adult) (pediatric)   . Other chronic allergic conjunctivitis   . Ulcer 1951   stomach    Family History  Problem Relation Age of Onset  . Colon cancer Father   . Stroke Mother   .  Heart attack Mother   . Colon cancer Mother   . Rectal cancer Neg Hx   . Stomach cancer Neg Hx     Past Surgical History:  Procedure Laterality Date  . ANTERIOR APPROACH HEMI HIP ARTHROPLASTY Left 03/17/2019   Procedure: ANTERIOR APPROACH HEMI HIP ARTHROPLASTY;  Surgeon: Leandrew Koyanagi, MD;  Location: [facility];  Service: Orthopedics;  Laterality: Left;  . APPENDECTOMY    . CATARACT EXTRACTION W/ INTRAOCULAR LENS  IMPLANT, BILATERAL  2003  . COLONOSCOPY    . ESOPHAGOGASTRODUODENOSCOPY    . HIP PINNING,CANNULATED Right 02/20/2019   Procedure: CANNULATED HIP PINNING;  Surgeon: Leandrew Koyanagi, MD;  Location: [facility];  Service: Orthopedics;  Laterality: Right;  . TONSILLECTOMY      Social History   Occupational History  . Not on file  Tobacco Use  . Smoking status: Never Smoker  . Smokeless tobacco: Never Used  Substance and Sexual Activity  . Alcohol use: No  . Drug use: No  . Sexual activity: Not on file

## 2019-06-26 DIAGNOSIS — M6281 Muscle weakness (generalized): Secondary | ICD-10-CM | POA: Diagnosis not present

## 2019-06-26 DIAGNOSIS — R278 Other lack of coordination: Secondary | ICD-10-CM | POA: Diagnosis not present

## 2019-06-26 DIAGNOSIS — M25552 Pain in left hip: Secondary | ICD-10-CM | POA: Diagnosis not present

## 2019-06-26 DIAGNOSIS — Z9181 History of falling: Secondary | ICD-10-CM | POA: Diagnosis not present

## 2019-06-26 DIAGNOSIS — R488 Other symbolic dysfunctions: Secondary | ICD-10-CM | POA: Diagnosis not present

## 2019-06-26 DIAGNOSIS — Z4789 Encounter for other orthopedic aftercare: Secondary | ICD-10-CM | POA: Diagnosis not present

## 2019-06-28 DIAGNOSIS — R278 Other lack of coordination: Secondary | ICD-10-CM | POA: Diagnosis not present

## 2019-06-28 DIAGNOSIS — R488 Other symbolic dysfunctions: Secondary | ICD-10-CM | POA: Diagnosis not present

## 2019-06-28 DIAGNOSIS — M6281 Muscle weakness (generalized): Secondary | ICD-10-CM | POA: Diagnosis not present

## 2019-06-28 DIAGNOSIS — Z9181 History of falling: Secondary | ICD-10-CM | POA: Diagnosis not present

## 2019-06-28 DIAGNOSIS — M25552 Pain in left hip: Secondary | ICD-10-CM | POA: Diagnosis not present

## 2019-06-28 DIAGNOSIS — Z4789 Encounter for other orthopedic aftercare: Secondary | ICD-10-CM | POA: Diagnosis not present

## 2019-06-29 DIAGNOSIS — Z9181 History of falling: Secondary | ICD-10-CM | POA: Diagnosis not present

## 2019-06-29 DIAGNOSIS — R488 Other symbolic dysfunctions: Secondary | ICD-10-CM | POA: Diagnosis not present

## 2019-06-29 DIAGNOSIS — M25552 Pain in left hip: Secondary | ICD-10-CM | POA: Diagnosis not present

## 2019-06-29 DIAGNOSIS — Z4789 Encounter for other orthopedic aftercare: Secondary | ICD-10-CM | POA: Diagnosis not present

## 2019-06-29 DIAGNOSIS — M6281 Muscle weakness (generalized): Secondary | ICD-10-CM | POA: Diagnosis not present

## 2019-06-29 DIAGNOSIS — R278 Other lack of coordination: Secondary | ICD-10-CM | POA: Diagnosis not present

## 2019-06-30 DIAGNOSIS — M6281 Muscle weakness (generalized): Secondary | ICD-10-CM | POA: Diagnosis not present

## 2019-06-30 DIAGNOSIS — Z9181 History of falling: Secondary | ICD-10-CM | POA: Diagnosis not present

## 2019-06-30 DIAGNOSIS — M25552 Pain in left hip: Secondary | ICD-10-CM | POA: Diagnosis not present

## 2019-06-30 DIAGNOSIS — Z4789 Encounter for other orthopedic aftercare: Secondary | ICD-10-CM | POA: Diagnosis not present

## 2019-06-30 DIAGNOSIS — R488 Other symbolic dysfunctions: Secondary | ICD-10-CM | POA: Diagnosis not present

## 2019-06-30 DIAGNOSIS — R278 Other lack of coordination: Secondary | ICD-10-CM | POA: Diagnosis not present

## 2019-07-03 DIAGNOSIS — R488 Other symbolic dysfunctions: Secondary | ICD-10-CM | POA: Diagnosis not present

## 2019-07-03 DIAGNOSIS — M6281 Muscle weakness (generalized): Secondary | ICD-10-CM | POA: Diagnosis not present

## 2019-07-03 DIAGNOSIS — Z9181 History of falling: Secondary | ICD-10-CM | POA: Diagnosis not present

## 2019-07-03 DIAGNOSIS — R278 Other lack of coordination: Secondary | ICD-10-CM | POA: Diagnosis not present

## 2019-07-03 DIAGNOSIS — M25552 Pain in left hip: Secondary | ICD-10-CM | POA: Diagnosis not present

## 2019-07-03 DIAGNOSIS — Z4789 Encounter for other orthopedic aftercare: Secondary | ICD-10-CM | POA: Diagnosis not present

## 2019-07-03 DIAGNOSIS — Z20828 Contact with and (suspected) exposure to other viral communicable diseases: Secondary | ICD-10-CM | POA: Diagnosis not present

## 2019-07-04 DIAGNOSIS — R488 Other symbolic dysfunctions: Secondary | ICD-10-CM | POA: Diagnosis not present

## 2019-07-04 DIAGNOSIS — R278 Other lack of coordination: Secondary | ICD-10-CM | POA: Diagnosis not present

## 2019-07-04 DIAGNOSIS — Z9181 History of falling: Secondary | ICD-10-CM | POA: Diagnosis not present

## 2019-07-04 DIAGNOSIS — M6281 Muscle weakness (generalized): Secondary | ICD-10-CM | POA: Diagnosis not present

## 2019-07-04 DIAGNOSIS — Z4789 Encounter for other orthopedic aftercare: Secondary | ICD-10-CM | POA: Diagnosis not present

## 2019-07-04 DIAGNOSIS — M25552 Pain in left hip: Secondary | ICD-10-CM | POA: Diagnosis not present

## 2019-07-05 DIAGNOSIS — R278 Other lack of coordination: Secondary | ICD-10-CM | POA: Diagnosis not present

## 2019-07-05 DIAGNOSIS — M25552 Pain in left hip: Secondary | ICD-10-CM | POA: Diagnosis not present

## 2019-07-05 DIAGNOSIS — Z9181 History of falling: Secondary | ICD-10-CM | POA: Diagnosis not present

## 2019-07-05 DIAGNOSIS — Z4789 Encounter for other orthopedic aftercare: Secondary | ICD-10-CM | POA: Diagnosis not present

## 2019-07-05 DIAGNOSIS — R488 Other symbolic dysfunctions: Secondary | ICD-10-CM | POA: Diagnosis not present

## 2019-07-05 DIAGNOSIS — M6281 Muscle weakness (generalized): Secondary | ICD-10-CM | POA: Diagnosis not present

## 2019-07-07 DIAGNOSIS — M6281 Muscle weakness (generalized): Secondary | ICD-10-CM | POA: Diagnosis not present

## 2019-07-07 DIAGNOSIS — Z4789 Encounter for other orthopedic aftercare: Secondary | ICD-10-CM | POA: Diagnosis not present

## 2019-07-07 DIAGNOSIS — R278 Other lack of coordination: Secondary | ICD-10-CM | POA: Diagnosis not present

## 2019-07-07 DIAGNOSIS — R488 Other symbolic dysfunctions: Secondary | ICD-10-CM | POA: Diagnosis not present

## 2019-07-07 DIAGNOSIS — Z9181 History of falling: Secondary | ICD-10-CM | POA: Diagnosis not present

## 2019-07-07 DIAGNOSIS — M25552 Pain in left hip: Secondary | ICD-10-CM | POA: Diagnosis not present

## 2019-07-07 DIAGNOSIS — Z20828 Contact with and (suspected) exposure to other viral communicable diseases: Secondary | ICD-10-CM | POA: Diagnosis not present

## 2019-07-10 DIAGNOSIS — M25552 Pain in left hip: Secondary | ICD-10-CM | POA: Diagnosis not present

## 2019-07-10 DIAGNOSIS — R278 Other lack of coordination: Secondary | ICD-10-CM | POA: Diagnosis not present

## 2019-07-10 DIAGNOSIS — Z20828 Contact with and (suspected) exposure to other viral communicable diseases: Secondary | ICD-10-CM | POA: Diagnosis not present

## 2019-07-10 DIAGNOSIS — R488 Other symbolic dysfunctions: Secondary | ICD-10-CM | POA: Diagnosis not present

## 2019-07-10 DIAGNOSIS — M6281 Muscle weakness (generalized): Secondary | ICD-10-CM | POA: Diagnosis not present

## 2019-07-10 DIAGNOSIS — Z9181 History of falling: Secondary | ICD-10-CM | POA: Diagnosis not present

## 2019-07-10 DIAGNOSIS — R262 Difficulty in walking, not elsewhere classified: Secondary | ICD-10-CM | POA: Diagnosis not present

## 2019-07-10 DIAGNOSIS — Z4789 Encounter for other orthopedic aftercare: Secondary | ICD-10-CM | POA: Diagnosis not present

## 2019-07-11 DIAGNOSIS — M25552 Pain in left hip: Secondary | ICD-10-CM | POA: Diagnosis not present

## 2019-07-11 DIAGNOSIS — Z4789 Encounter for other orthopedic aftercare: Secondary | ICD-10-CM | POA: Diagnosis not present

## 2019-07-11 DIAGNOSIS — M6281 Muscle weakness (generalized): Secondary | ICD-10-CM | POA: Diagnosis not present

## 2019-07-11 DIAGNOSIS — R278 Other lack of coordination: Secondary | ICD-10-CM | POA: Diagnosis not present

## 2019-07-11 DIAGNOSIS — R262 Difficulty in walking, not elsewhere classified: Secondary | ICD-10-CM | POA: Diagnosis not present

## 2019-07-11 DIAGNOSIS — R488 Other symbolic dysfunctions: Secondary | ICD-10-CM | POA: Diagnosis not present

## 2019-07-12 DIAGNOSIS — M6281 Muscle weakness (generalized): Secondary | ICD-10-CM | POA: Diagnosis not present

## 2019-07-12 DIAGNOSIS — M25552 Pain in left hip: Secondary | ICD-10-CM | POA: Diagnosis not present

## 2019-07-12 DIAGNOSIS — R488 Other symbolic dysfunctions: Secondary | ICD-10-CM | POA: Diagnosis not present

## 2019-07-12 DIAGNOSIS — R262 Difficulty in walking, not elsewhere classified: Secondary | ICD-10-CM | POA: Diagnosis not present

## 2019-07-12 DIAGNOSIS — R278 Other lack of coordination: Secondary | ICD-10-CM | POA: Diagnosis not present

## 2019-07-12 DIAGNOSIS — Z4789 Encounter for other orthopedic aftercare: Secondary | ICD-10-CM | POA: Diagnosis not present

## 2019-07-13 DIAGNOSIS — R488 Other symbolic dysfunctions: Secondary | ICD-10-CM | POA: Diagnosis not present

## 2019-07-13 DIAGNOSIS — R278 Other lack of coordination: Secondary | ICD-10-CM | POA: Diagnosis not present

## 2019-07-13 DIAGNOSIS — Z20828 Contact with and (suspected) exposure to other viral communicable diseases: Secondary | ICD-10-CM | POA: Diagnosis not present

## 2019-07-13 DIAGNOSIS — M25552 Pain in left hip: Secondary | ICD-10-CM | POA: Diagnosis not present

## 2019-07-13 DIAGNOSIS — Z4789 Encounter for other orthopedic aftercare: Secondary | ICD-10-CM | POA: Diagnosis not present

## 2019-07-13 DIAGNOSIS — R262 Difficulty in walking, not elsewhere classified: Secondary | ICD-10-CM | POA: Diagnosis not present

## 2019-07-13 DIAGNOSIS — M6281 Muscle weakness (generalized): Secondary | ICD-10-CM | POA: Diagnosis not present

## 2019-07-14 DIAGNOSIS — Z4789 Encounter for other orthopedic aftercare: Secondary | ICD-10-CM | POA: Diagnosis not present

## 2019-07-14 DIAGNOSIS — R278 Other lack of coordination: Secondary | ICD-10-CM | POA: Diagnosis not present

## 2019-07-14 DIAGNOSIS — R262 Difficulty in walking, not elsewhere classified: Secondary | ICD-10-CM | POA: Diagnosis not present

## 2019-07-14 DIAGNOSIS — M25552 Pain in left hip: Secondary | ICD-10-CM | POA: Diagnosis not present

## 2019-07-14 DIAGNOSIS — R488 Other symbolic dysfunctions: Secondary | ICD-10-CM | POA: Diagnosis not present

## 2019-07-14 DIAGNOSIS — M6281 Muscle weakness (generalized): Secondary | ICD-10-CM | POA: Diagnosis not present

## 2019-07-17 DIAGNOSIS — R278 Other lack of coordination: Secondary | ICD-10-CM | POA: Diagnosis not present

## 2019-07-17 DIAGNOSIS — M25552 Pain in left hip: Secondary | ICD-10-CM | POA: Diagnosis not present

## 2019-07-17 DIAGNOSIS — R488 Other symbolic dysfunctions: Secondary | ICD-10-CM | POA: Diagnosis not present

## 2019-07-17 DIAGNOSIS — Z20828 Contact with and (suspected) exposure to other viral communicable diseases: Secondary | ICD-10-CM | POA: Diagnosis not present

## 2019-07-17 DIAGNOSIS — R262 Difficulty in walking, not elsewhere classified: Secondary | ICD-10-CM | POA: Diagnosis not present

## 2019-07-17 DIAGNOSIS — Z4789 Encounter for other orthopedic aftercare: Secondary | ICD-10-CM | POA: Diagnosis not present

## 2019-07-17 DIAGNOSIS — M6281 Muscle weakness (generalized): Secondary | ICD-10-CM | POA: Diagnosis not present

## 2019-07-19 DIAGNOSIS — Z4789 Encounter for other orthopedic aftercare: Secondary | ICD-10-CM | POA: Diagnosis not present

## 2019-07-19 DIAGNOSIS — R488 Other symbolic dysfunctions: Secondary | ICD-10-CM | POA: Diagnosis not present

## 2019-07-19 DIAGNOSIS — M25552 Pain in left hip: Secondary | ICD-10-CM | POA: Diagnosis not present

## 2019-07-19 DIAGNOSIS — R278 Other lack of coordination: Secondary | ICD-10-CM | POA: Diagnosis not present

## 2019-07-19 DIAGNOSIS — M6281 Muscle weakness (generalized): Secondary | ICD-10-CM | POA: Diagnosis not present

## 2019-07-19 DIAGNOSIS — R262 Difficulty in walking, not elsewhere classified: Secondary | ICD-10-CM | POA: Diagnosis not present

## 2019-07-20 DIAGNOSIS — R488 Other symbolic dysfunctions: Secondary | ICD-10-CM | POA: Diagnosis not present

## 2019-07-20 DIAGNOSIS — Z20828 Contact with and (suspected) exposure to other viral communicable diseases: Secondary | ICD-10-CM | POA: Diagnosis not present

## 2019-07-20 DIAGNOSIS — M25552 Pain in left hip: Secondary | ICD-10-CM | POA: Diagnosis not present

## 2019-07-20 DIAGNOSIS — Z4789 Encounter for other orthopedic aftercare: Secondary | ICD-10-CM | POA: Diagnosis not present

## 2019-07-20 DIAGNOSIS — M6281 Muscle weakness (generalized): Secondary | ICD-10-CM | POA: Diagnosis not present

## 2019-07-20 DIAGNOSIS — R278 Other lack of coordination: Secondary | ICD-10-CM | POA: Diagnosis not present

## 2019-07-20 DIAGNOSIS — R262 Difficulty in walking, not elsewhere classified: Secondary | ICD-10-CM | POA: Diagnosis not present

## 2019-07-21 DIAGNOSIS — R278 Other lack of coordination: Secondary | ICD-10-CM | POA: Diagnosis not present

## 2019-07-21 DIAGNOSIS — M6281 Muscle weakness (generalized): Secondary | ICD-10-CM | POA: Diagnosis not present

## 2019-07-21 DIAGNOSIS — R262 Difficulty in walking, not elsewhere classified: Secondary | ICD-10-CM | POA: Diagnosis not present

## 2019-07-21 DIAGNOSIS — M25552 Pain in left hip: Secondary | ICD-10-CM | POA: Diagnosis not present

## 2019-07-21 DIAGNOSIS — R488 Other symbolic dysfunctions: Secondary | ICD-10-CM | POA: Diagnosis not present

## 2019-07-21 DIAGNOSIS — Z4789 Encounter for other orthopedic aftercare: Secondary | ICD-10-CM | POA: Diagnosis not present

## 2019-07-24 ENCOUNTER — Emergency Department (HOSPITAL_COMMUNITY)
Admission: EM | Admit: 2019-07-24 | Discharge: 2019-07-24 | Disposition: A | Discharge status: Home / Self Care | Payer: Medicare Other | Attending: Emergency Medicine | Admitting: Emergency Medicine

## 2019-07-24 ENCOUNTER — Emergency Department (HOSPITAL_COMMUNITY): Payer: Medicare Other

## 2019-07-24 ENCOUNTER — Encounter (HOSPITAL_COMMUNITY): Payer: Self-pay | Admitting: Emergency Medicine

## 2019-07-24 ENCOUNTER — Other Ambulatory Visit: Payer: Self-pay

## 2019-07-24 DIAGNOSIS — Z4789 Encounter for other orthopedic aftercare: Secondary | ICD-10-CM | POA: Diagnosis not present

## 2019-07-24 DIAGNOSIS — F0390 Unspecified dementia without behavioral disturbance: Secondary | ICD-10-CM | POA: Diagnosis not present

## 2019-07-24 DIAGNOSIS — S0990XA Unspecified injury of head, initial encounter: Secondary | ICD-10-CM | POA: Diagnosis not present

## 2019-07-24 DIAGNOSIS — Y939 Activity, unspecified: Secondary | ICD-10-CM | POA: Diagnosis not present

## 2019-07-24 DIAGNOSIS — R488 Other symbolic dysfunctions: Secondary | ICD-10-CM | POA: Diagnosis not present

## 2019-07-24 DIAGNOSIS — Z7401 Bed confinement status: Secondary | ICD-10-CM | POA: Diagnosis not present

## 2019-07-24 DIAGNOSIS — R262 Difficulty in walking, not elsewhere classified: Secondary | ICD-10-CM | POA: Diagnosis not present

## 2019-07-24 DIAGNOSIS — W19XXXA Unspecified fall, initial encounter: Secondary | ICD-10-CM | POA: Insufficient documentation

## 2019-07-24 DIAGNOSIS — Y92129 Unspecified place in nursing home as the place of occurrence of the external cause: Secondary | ICD-10-CM | POA: Diagnosis not present

## 2019-07-24 DIAGNOSIS — R58 Hemorrhage, not elsewhere classified: Secondary | ICD-10-CM | POA: Diagnosis not present

## 2019-07-24 DIAGNOSIS — Z23 Encounter for immunization: Secondary | ICD-10-CM | POA: Diagnosis not present

## 2019-07-24 DIAGNOSIS — S0101XA Laceration without foreign body of scalp, initial encounter: Secondary | ICD-10-CM | POA: Diagnosis not present

## 2019-07-24 DIAGNOSIS — R278 Other lack of coordination: Secondary | ICD-10-CM | POA: Diagnosis not present

## 2019-07-24 DIAGNOSIS — R410 Disorientation, unspecified: Secondary | ICD-10-CM | POA: Diagnosis not present

## 2019-07-24 DIAGNOSIS — Z79899 Other long term (current) drug therapy: Secondary | ICD-10-CM | POA: Diagnosis not present

## 2019-07-24 DIAGNOSIS — R4182 Altered mental status, unspecified: Secondary | ICD-10-CM | POA: Diagnosis not present

## 2019-07-24 DIAGNOSIS — Y999 Unspecified external cause status: Secondary | ICD-10-CM | POA: Diagnosis not present

## 2019-07-24 DIAGNOSIS — M2550 Pain in unspecified joint: Secondary | ICD-10-CM | POA: Diagnosis not present

## 2019-07-24 DIAGNOSIS — F29 Unspecified psychosis not due to a substance or known physiological condition: Secondary | ICD-10-CM | POA: Diagnosis not present

## 2019-07-24 DIAGNOSIS — M6281 Muscle weakness (generalized): Secondary | ICD-10-CM | POA: Diagnosis not present

## 2019-07-24 DIAGNOSIS — S199XXA Unspecified injury of neck, initial encounter: Secondary | ICD-10-CM | POA: Diagnosis not present

## 2019-07-24 DIAGNOSIS — M25552 Pain in left hip: Secondary | ICD-10-CM | POA: Diagnosis not present

## 2019-07-24 MED ORDER — BACITRACIN ZINC 500 UNIT/GM EX OINT
TOPICAL_OINTMENT | CUTANEOUS | Status: AC
  Administered 2019-07-24: 10:00:00
  Filled 2019-07-24: qty 1.8

## 2019-07-24 MED ORDER — TETANUS-DIPHTH-ACELL PERTUSSIS 5-2.5-18.5 LF-MCG/0.5 IM SUSP
0.5000 mL | Freq: Once | INTRAMUSCULAR | Status: AC
  Administered 2019-07-24: 0.5 mL via INTRAMUSCULAR
  Filled 2019-07-24: qty 0.5

## 2019-07-24 MED ORDER — LIDOCAINE-EPINEPHRINE (PF) 2 %-1:200000 IJ SOLN
10.0000 mL | Freq: Once | INTRAMUSCULAR | Status: AC
  Administered 2019-07-24: 10 mL
  Filled 2019-07-24: qty 10

## 2019-07-24 NOTE — ED Notes (Signed)
[facility] called at  this time. 

## 2019-07-24 NOTE — ED Notes (Signed)
Provider at bedside

## 2019-07-24 NOTE — ED Notes (Signed)
Patient transported to CT 

## 2019-07-24 NOTE — ED Triage Notes (Signed)
Patient here from [place] with complaints of unwitnessed fall. Lac noted to right side of head. Hx of Alzheimers.

## 2019-07-24 NOTE — ED Notes (Signed)
[facility] notified of patient arriving by [facility].

## 2019-07-24 NOTE — ED Notes (Signed)
Pt c/o needing to urinate so RN assisted with urinal. Urine sample at bedside if needed.

## 2019-07-24 NOTE — ED Notes (Signed)
Head lac irrigated.

## 2019-07-24 NOTE — ED Provider Notes (Signed)
[facility] DEPT Provider Note   CSN: [id number] Arrival date & time: 07/24/19  [id number]     History   Chief Complaint Chief Complaint  Patient presents with   Fall   Head Laceration    HPI Tyre Baudilio Gavrilov is a 85 y.o. male.     HPI    Charle Jamonta Riopelle is a 85 y.o. male, with a history of dementia, hyperlipidemia, presenting to the ED for evaluation following a fall today.  Patient via EMS from a nursing home following unwitnessed fall.  He has a noted laceration to the right side of the head.  Otherwise reported to be acting normally. Patient denies any complaints.      Past Medical History:  Diagnosis Date   Allergic rhinitis, cause unspecified    Anxiety state, unspecified    Arthritis    Benign neoplasm of colon    Depressive disorder, not elsewhere classified    Esophageal reflux    Hyperlipidemia    Irritable bowel syndrome    Obstructive sleep apnea (adult) (pediatric)    Other chronic allergic conjunctivitis    Ulcer 1951   stomach    Patient Active Problem List   Diagnosis Date Noted   Closed displaced fracture of right femoral neck ([place]) 02/19/2019   Hip fracture ([place]) 02/19/2019   Closed right hip fracture ([place]) 02/19/2019   Evaluation by psychiatric service required    Hypoxia 02/13/2018   Edema 02/13/2018   Hypoxemia 02/13/2018   Dementia ([employer]) 02/13/2018   Altered mental status 02/13/2018   Dermatitis 10/08/2015   CHEST PAIN, ATYPICAL 04/11/2010   Personal history of colon polyps and family history of colon cancer 12/19/2007   Anxiety state 12/19/2007   DEPRESSION 12/19/2007   Bicipital tendinitis, left shoulder 12/19/2007   ALLERGIC CONJUNCTIVITIS 12/19/2007   Seasonal allergic rhinitis 12/19/2007   GERD 12/19/2007   IBS 12/19/2007    Past Surgical History:  Procedure Laterality Date   ANTERIOR APPROACH HEMI HIP ARTHROPLASTY Left 03/17/2019   Procedure: ANTERIOR  APPROACH HEMI HIP ARTHROPLASTY;  Surgeon: Leandrew Koyanagi, MD;  Location: [facility];  Service: Orthopedics;  Laterality: Left;   APPENDECTOMY     CATARACT EXTRACTION W/ INTRAOCULAR LENS  IMPLANT, BILATERAL  2003   COLONOSCOPY     ESOPHAGOGASTRODUODENOSCOPY     HIP PINNING,CANNULATED Right 02/20/2019   Procedure: CANNULATED HIP PINNING;  Surgeon: Leandrew Koyanagi, MD;  Location: [facility];  Service: Orthopedics;  Laterality: Right;   TONSILLECTOMY          Home Medications    Prior to Admission medications   Medication Sig Start Date End Date Taking? Authorizing Provider  acetaminophen (TYLENOL) 325 MG tablet Take 2 tablets (650 mg total) by mouth every 6 (six) hours as needed for mild pain, fever or headache (pain score 1-3 or temp > 100.5). 03/22/19   [place] Donnamarie Poag, DO  docusate sodium (COLACE) 100 MG capsule Take 1 capsule (100 mg total) by mouth 2 (two) times daily. 03/22/19   [place] Eric J, DO  enoxaparin (LOVENOX) 40 MG/0.4ML injection Inject 0.4 mLs (40 mg total) into the skin daily. 03/17/19   Leandrew Koyanagi, MD  feeding supplement, ENSURE ENLIVE, (ENSURE ENLIVE) LIQD Take 237 mLs by mouth 2 (two) times daily between meals. 02/24/19   Dana Allan I, MD  hydrocortisone 2.5 % cream Apply 1 application topically 2 (two) times daily. Apply to rash on right arm    [provider]  LORazepam (ATIVAN)  0.5 MG tablet Take 1 tablet (0.5 mg total) by mouth every 12 (twelve) hours as needed (agitation). 03/22/19   [place] Donnamarie Poag, DO  methocarbamol (ROBAXIN) 500 MG tablet Take 1 tablet (500 mg total) by mouth every 6 (six) hours as needed for muscle spasms. 03/22/19   [place] Donnamarie Poag, DO  Multiple Vitamin (MULTIVITAMIN WITH MINERALS) TABS tablet Take 1 tablet by mouth daily. 02/25/19   Bonnell Public, MD  neomycin-bacitracin-polymyxin (NEOSPORIN) [phone] ointment Apply 1 application topically every evening. Apply to left wrist for skin tear    [provider]  nystatin cream  (MYCOSTATIN) Apply 1 application topically 2 (two) times daily. Apply to groin/penis for yeast    [provider]  oxyCODONE-acetaminophen (PERCOCET) 5-325 MG tablet Take 1-2 tablets by mouth every 8 (eight) hours as needed for severe pain. 03/17/19   Leandrew Koyanagi, MD  polyethylene glycol (MIRALAX / GLYCOLAX) 17 g packet Take 17 g by mouth daily as needed for mild constipation. 03/22/19   [place] Donnamarie Poag, DO  traZODone (DESYREL) 50 MG tablet Take 1 tablet (50 mg total) by mouth at bedtime. 03/22/19   [place] Eric J, DO    Family History Family History  Problem Relation Age of Onset   Colon cancer Father    Stroke Mother    Heart attack Mother    Colon cancer Mother    Rectal cancer Neg Hx    Stomach cancer Neg Hx     Social History Social History   Tobacco Use   Smoking status: Never Smoker   Smokeless tobacco: Never Used  Substance Use Topics   Alcohol use: No   Drug use: No     Allergies   Codeine, Penicillins, Ranitidine, and Amoxicillin   Review of Systems Review of Systems  Unable to perform ROS: Dementia     Physical Exam Updated Vital Signs BP (!) 149/84 (BP Location: Left Arm)    Pulse 60    Temp 98 F (36.7 C) (Oral)    Resp 18    SpO2 100%   Physical Exam Vitals signs and nursing note reviewed.  Constitutional:      General: He is not in acute distress.    Appearance: He is well-developed. He is not diaphoretic.  HENT:     Head: Normocephalic.     Comments: Approximately 2.5 cm linear laceration to the right parietal scalp. No noted surrounding swelling, tenderness, deformity, or instability. The rest of the face and scalp were examined without other wounds noted and without noted swelling, deformity, instability, tenderness, or color change.    Mouth/Throat:     Mouth: Mucous membranes are moist.     Pharynx: Oropharynx is clear.  Eyes:     Conjunctiva/sclera: Conjunctivae normal.  Neck:     Musculoskeletal: Normal range of  motion and neck supple.  Cardiovascular:     Rate and Rhythm: Regular rhythm. Bradycardia present.     Pulses: Normal pulses.          Radial pulses are 2+ on the right side and 2+ on the left side.       Posterior tibial pulses are 2+ on the right side and 2+ on the left side.     Comments: Tactile temperature in the extremities appropriate and equal bilaterally. Mild bradycardia noted. This has been previously noted with this patient. Pulmonary:     Effort: Pulmonary effort is normal. No respiratory distress.     Breath sounds: Normal breath sounds.  Abdominal:     Palpations: Abdomen is soft.     Tenderness: There is no abdominal tenderness. There is no guarding.  Musculoskeletal:     Right lower leg: Edema present.     Left lower leg: Edema present.     Comments: Normal motor function intact in all extremities. No midline spinal tenderness.  No pain or tenderness noted to the pelvis or either hip.  No pain with range of motion of the hips.  Pelvis appears to be stable.  Lymphadenopathy:     Cervical: No cervical adenopathy.  Skin:    General: Skin is warm and dry.  Neurological:     Mental Status: He is alert.     Comments: Sensation to light touch grossly intact in each of the extremities. Grip strength strong and equal bilaterally. Cranial nerves III through XII grossly intact. Strength 5/5 in each of the extremities.  Psychiatric:        Mood and Affect: Mood and affect normal.        Speech: Speech normal.        Behavior: Behavior normal.      ED Treatments / Results  Labs (all labs ordered are listed, but only abnormal results are displayed) Labs Reviewed - No data to display  EKG None  Radiology Ct Head Wo Contrast  Result Date: 07/24/2019 CLINICAL DATA:  Head trauma with unwitnessed fall EXAM: CT HEAD WITHOUT CONTRAST CT CERVICAL SPINE WITHOUT CONTRAST TECHNIQUE: Multidetector CT imaging of the head and cervical spine was performed following the standard  protocol without intravenous contrast. Multiplanar CT image reconstructions of the cervical spine were also generated. COMPARISON:  02/19/2019 FINDINGS: CT HEAD FINDINGS Brain: No evidence of acute infarction, hemorrhage, hydrocephalus, extra-axial collection or mass lesion/mass effect. Brain atrophy most notable in the biparietal region. Mild chronic small vessel ischemic type change Vascular: No hyperdense vessel or unexpected calcification. Skull: Negative for fracture. Arachnoid granulations seen along the bilateral transverse sinus. Sinuses/Orbits: No evidence of injury.  Bilateral cataract resection CT CERVICAL SPINE FINDINGS Alignment: No traumatic malalignment. Mild degenerative C6-7 anterolisthesis. Skull base and vertebrae: Negative for fracture. C2-3 incomplete segmentation or ankylosis. Soft tissues and spinal canal: No prevertebral fluid or swelling. No visible canal hematoma. Disc levels:  Mid and lower cervical degenerative facet spurring. Upper chest: Negative IMPRESSION: No evidence of acute intracranial or cervical spine injury. Electronically Signed   By: Monte Fantasia M.D.   On: 07/24/2019 07:10   Ct Cervical Spine Wo Contrast  Result Date: 07/24/2019 CLINICAL DATA:  Head trauma with unwitnessed fall EXAM: CT HEAD WITHOUT CONTRAST CT CERVICAL SPINE WITHOUT CONTRAST TECHNIQUE: Multidetector CT imaging of the head and cervical spine was performed following the standard protocol without intravenous contrast. Multiplanar CT image reconstructions of the cervical spine were also generated. COMPARISON:  02/19/2019 FINDINGS: CT HEAD FINDINGS Brain: No evidence of acute infarction, hemorrhage, hydrocephalus, extra-axial collection or mass lesion/mass effect. Brain atrophy most notable in the biparietal region. Mild chronic small vessel ischemic type change Vascular: No hyperdense vessel or unexpected calcification. Skull: Negative for fracture. Arachnoid granulations seen along the bilateral  transverse sinus. Sinuses/Orbits: No evidence of injury.  Bilateral cataract resection CT CERVICAL SPINE FINDINGS Alignment: No traumatic malalignment. Mild degenerative C6-7 anterolisthesis. Skull base and vertebrae: Negative for fracture. C2-3 incomplete segmentation or ankylosis. Soft tissues and spinal canal: No prevertebral fluid or swelling. No visible canal hematoma. Disc levels:  Mid and lower cervical degenerative facet spurring. Upper chest: Negative IMPRESSION: No evidence of  acute intracranial or cervical spine injury. Electronically Signed   By: Monte Fantasia M.D.   On: 07/24/2019 07:10    Procedures .Marland KitchenLaceration Repair  Date/Time: 07/24/2019 9:23 AM Performed by: Lorayne Bender, PA-C Authorized by: Lorayne Bender, PA-C   Consent:    Consent obtained:  Verbal   Consent given by:  Patient   Risks discussed:  Infection, need for additional repair, pain, poor cosmetic result and poor wound healing Laceration details:    Location:  Scalp   Scalp location:  R parietal   Length (cm):  2.5 Repair type:    Repair type:  Simple Pre-procedure details:    Preparation:  Patient was prepped and draped in usual sterile fashion and imaging obtained to evaluate for foreign bodies Exploration:    Hemostasis achieved with:  Epinephrine   Wound exploration: wound explored through full range of motion and entire depth of wound probed and visualized   Treatment:    Area cleansed with:  Betadine and saline   Amount of cleaning:  Standard   Irrigation solution:  Sterile saline   Irrigation method:  Syringe Skin repair:    Repair method:  Sutures   Suture size:  4-0   Wound skin closure material used: Vicryl Rapide.   Suture technique:  Simple interrupted   Number of sutures:  4 Approximation:    Approximation:  Close Post-procedure details:    Dressing:  Open (no dressing)   Patient tolerance of procedure:  Tolerated well, no immediate complications   (including critical care  time)  Medications Ordered in ED Medications  lidocaine-EPINEPHrine (XYLOCAINE W/EPI) 2 %-1:200000 (PF) injection 10 mL (10 mLs Infiltration Given 07/24/19 0804)  Tdap (BOOSTRIX) injection 0.5 mL (0.5 mLs Intramuscular Given 07/24/19 0801)  bacitracin 500 UNIT/GM ointment (  Given 07/24/19 0939)     Initial Impression / Assessment and Plan / ED Course  I have reviewed the triage vital signs and the nursing notes.  Pertinent labs & imaging results that were available during my care of the patient were reviewed by me and considered in my medical decision making (see chart for details).        Patient presents from a nursing home following an unwitnessed fall.  Laceration repaired without immediate complication.  CT of the head and C-spine without acute abnormality. Patient ambulated prior to discharge without noted difficulty.   Findings and plan of care discussed with Antony Blackbird, MD. Dr. Sherry Ruffing personally evaluated and examined this patient.  Final Clinical Impressions(s) / ED Diagnoses   Final diagnoses:  Laceration of scalp, initial encounter  Injury of head, initial encounter    ED Discharge Orders    None       Layla Maw 07/24/19 1123    Tegeler, Gwenyth Allegra, MD 07/25/19 1654

## 2019-07-24 NOTE — Discharge Instructions (Addendum)
°  Wound Care - Laceration You may remove the bandage after 24 hours. Clean the wound and surrounding area gently with tap water and mild soap. Rinse well and blot dry. Do not scrub the wound, as this may cause the wound edges to come apart. You may shower, but avoid submerging the wound, such as with a bath or swimming. Clean the wound daily to prevent infection. Do not use cleaners such as hydrogen peroxide or alcohol.   Scar reduction: Application of a topical antibiotic ointment, such as Neosporin, after the wound has begun to close and heal well can decrease scab formation and reduce scarring. After the wound has healed and wound closures have been removed, application of ointments such as Aquaphor can also reduce scar formation.  The key to scar reduction is keeping the skin well hydrated and supple. Drinking plenty of water throughout the day (At least eight 8oz glasses of water a day) is essential to staying well hydrated.  Sun exposure: Keep the wound out of the sun. After the wound has healed, continue to protect it from the sun by wearing protective clothing or applying sunscreen.  Pain: You may use Tylenol for pain.  Suture/staple removal: Sutures should fall out on their own, however, should they remain after about 10 days, they may be manually removed.  Return to the ED sooner should the wound edges come apart or signs of infection arise, such as spreading redness, puffiness/swelling, pus draining from the wound, severe increase in pain, fever over 100.52F, or any other major issues.  For prescription assistance, may try using prescription discount sites or apps, such as goodrx.com

## 2019-07-25 DIAGNOSIS — M6281 Muscle weakness (generalized): Secondary | ICD-10-CM | POA: Diagnosis not present

## 2019-07-25 DIAGNOSIS — Z20828 Contact with and (suspected) exposure to other viral communicable diseases: Secondary | ICD-10-CM | POA: Diagnosis not present

## 2019-07-25 DIAGNOSIS — R262 Difficulty in walking, not elsewhere classified: Secondary | ICD-10-CM | POA: Diagnosis not present

## 2019-07-25 DIAGNOSIS — R488 Other symbolic dysfunctions: Secondary | ICD-10-CM | POA: Diagnosis not present

## 2019-07-25 DIAGNOSIS — Z4789 Encounter for other orthopedic aftercare: Secondary | ICD-10-CM | POA: Diagnosis not present

## 2019-07-25 DIAGNOSIS — R278 Other lack of coordination: Secondary | ICD-10-CM | POA: Diagnosis not present

## 2019-07-25 DIAGNOSIS — M25552 Pain in left hip: Secondary | ICD-10-CM | POA: Diagnosis not present

## 2019-07-26 DIAGNOSIS — Z4789 Encounter for other orthopedic aftercare: Secondary | ICD-10-CM | POA: Diagnosis not present

## 2019-07-26 DIAGNOSIS — R488 Other symbolic dysfunctions: Secondary | ICD-10-CM | POA: Diagnosis not present

## 2019-07-26 DIAGNOSIS — M6281 Muscle weakness (generalized): Secondary | ICD-10-CM | POA: Diagnosis not present

## 2019-07-26 DIAGNOSIS — M25552 Pain in left hip: Secondary | ICD-10-CM | POA: Diagnosis not present

## 2019-07-26 DIAGNOSIS — R278 Other lack of coordination: Secondary | ICD-10-CM | POA: Diagnosis not present

## 2019-07-26 DIAGNOSIS — R262 Difficulty in walking, not elsewhere classified: Secondary | ICD-10-CM | POA: Diagnosis not present

## 2019-07-28 DIAGNOSIS — M6281 Muscle weakness (generalized): Secondary | ICD-10-CM | POA: Diagnosis not present

## 2019-07-28 DIAGNOSIS — R488 Other symbolic dysfunctions: Secondary | ICD-10-CM | POA: Diagnosis not present

## 2019-07-28 DIAGNOSIS — M25552 Pain in left hip: Secondary | ICD-10-CM | POA: Diagnosis not present

## 2019-07-28 DIAGNOSIS — R278 Other lack of coordination: Secondary | ICD-10-CM | POA: Diagnosis not present

## 2019-07-28 DIAGNOSIS — R262 Difficulty in walking, not elsewhere classified: Secondary | ICD-10-CM | POA: Diagnosis not present

## 2019-07-28 DIAGNOSIS — Z4789 Encounter for other orthopedic aftercare: Secondary | ICD-10-CM | POA: Diagnosis not present

## 2019-07-30 DIAGNOSIS — R262 Difficulty in walking, not elsewhere classified: Secondary | ICD-10-CM | POA: Diagnosis not present

## 2019-07-30 DIAGNOSIS — M25552 Pain in left hip: Secondary | ICD-10-CM | POA: Diagnosis not present

## 2019-07-30 DIAGNOSIS — M6281 Muscle weakness (generalized): Secondary | ICD-10-CM | POA: Diagnosis not present

## 2019-07-30 DIAGNOSIS — R278 Other lack of coordination: Secondary | ICD-10-CM | POA: Diagnosis not present

## 2019-07-30 DIAGNOSIS — Z4789 Encounter for other orthopedic aftercare: Secondary | ICD-10-CM | POA: Diagnosis not present

## 2019-07-30 DIAGNOSIS — R488 Other symbolic dysfunctions: Secondary | ICD-10-CM | POA: Diagnosis not present

## 2019-07-31 DIAGNOSIS — M25552 Pain in left hip: Secondary | ICD-10-CM | POA: Diagnosis not present

## 2019-07-31 DIAGNOSIS — M6281 Muscle weakness (generalized): Secondary | ICD-10-CM | POA: Diagnosis not present

## 2019-07-31 DIAGNOSIS — R278 Other lack of coordination: Secondary | ICD-10-CM | POA: Diagnosis not present

## 2019-07-31 DIAGNOSIS — Z79899 Other long term (current) drug therapy: Secondary | ICD-10-CM | POA: Diagnosis not present

## 2019-07-31 DIAGNOSIS — G301 Alzheimer's disease with late onset: Secondary | ICD-10-CM | POA: Diagnosis not present

## 2019-07-31 DIAGNOSIS — R262 Difficulty in walking, not elsewhere classified: Secondary | ICD-10-CM | POA: Diagnosis not present

## 2019-07-31 DIAGNOSIS — R488 Other symbolic dysfunctions: Secondary | ICD-10-CM | POA: Diagnosis not present

## 2019-07-31 DIAGNOSIS — E782 Mixed hyperlipidemia: Secondary | ICD-10-CM | POA: Diagnosis not present

## 2019-07-31 DIAGNOSIS — Z712 Person consulting for explanation of examination or test findings: Secondary | ICD-10-CM | POA: Diagnosis not present

## 2019-07-31 DIAGNOSIS — S0101XA Laceration without foreign body of scalp, initial encounter: Secondary | ICD-10-CM | POA: Diagnosis not present

## 2019-07-31 DIAGNOSIS — Z4789 Encounter for other orthopedic aftercare: Secondary | ICD-10-CM | POA: Diagnosis not present

## 2019-07-31 DIAGNOSIS — Z09 Encounter for follow-up examination after completed treatment for conditions other than malignant neoplasm: Secondary | ICD-10-CM | POA: Diagnosis not present

## 2019-08-01 DIAGNOSIS — Z4789 Encounter for other orthopedic aftercare: Secondary | ICD-10-CM | POA: Diagnosis not present

## 2019-08-01 DIAGNOSIS — R278 Other lack of coordination: Secondary | ICD-10-CM | POA: Diagnosis not present

## 2019-08-01 DIAGNOSIS — M6281 Muscle weakness (generalized): Secondary | ICD-10-CM | POA: Diagnosis not present

## 2019-08-01 DIAGNOSIS — R262 Difficulty in walking, not elsewhere classified: Secondary | ICD-10-CM | POA: Diagnosis not present

## 2019-08-01 DIAGNOSIS — R488 Other symbolic dysfunctions: Secondary | ICD-10-CM | POA: Diagnosis not present

## 2019-08-01 DIAGNOSIS — Z20828 Contact with and (suspected) exposure to other viral communicable diseases: Secondary | ICD-10-CM | POA: Diagnosis not present

## 2019-08-01 DIAGNOSIS — M25552 Pain in left hip: Secondary | ICD-10-CM | POA: Diagnosis not present

## 2019-08-02 DIAGNOSIS — M25552 Pain in left hip: Secondary | ICD-10-CM | POA: Diagnosis not present

## 2019-08-02 DIAGNOSIS — R278 Other lack of coordination: Secondary | ICD-10-CM | POA: Diagnosis not present

## 2019-08-02 DIAGNOSIS — R262 Difficulty in walking, not elsewhere classified: Secondary | ICD-10-CM | POA: Diagnosis not present

## 2019-08-02 DIAGNOSIS — M6281 Muscle weakness (generalized): Secondary | ICD-10-CM | POA: Diagnosis not present

## 2019-08-02 DIAGNOSIS — Z4789 Encounter for other orthopedic aftercare: Secondary | ICD-10-CM | POA: Diagnosis not present

## 2019-08-02 DIAGNOSIS — R488 Other symbolic dysfunctions: Secondary | ICD-10-CM | POA: Diagnosis not present

## 2019-08-04 DIAGNOSIS — R262 Difficulty in walking, not elsewhere classified: Secondary | ICD-10-CM | POA: Diagnosis not present

## 2019-08-04 DIAGNOSIS — M6281 Muscle weakness (generalized): Secondary | ICD-10-CM | POA: Diagnosis not present

## 2019-08-04 DIAGNOSIS — Z4789 Encounter for other orthopedic aftercare: Secondary | ICD-10-CM | POA: Diagnosis not present

## 2019-08-04 DIAGNOSIS — R488 Other symbolic dysfunctions: Secondary | ICD-10-CM | POA: Diagnosis not present

## 2019-08-04 DIAGNOSIS — R278 Other lack of coordination: Secondary | ICD-10-CM | POA: Diagnosis not present

## 2019-08-04 DIAGNOSIS — M25552 Pain in left hip: Secondary | ICD-10-CM | POA: Diagnosis not present

## 2019-08-07 DIAGNOSIS — M25552 Pain in left hip: Secondary | ICD-10-CM | POA: Diagnosis not present

## 2019-08-07 DIAGNOSIS — R278 Other lack of coordination: Secondary | ICD-10-CM | POA: Diagnosis not present

## 2019-08-07 DIAGNOSIS — R488 Other symbolic dysfunctions: Secondary | ICD-10-CM | POA: Diagnosis not present

## 2019-08-07 DIAGNOSIS — M6281 Muscle weakness (generalized): Secondary | ICD-10-CM | POA: Diagnosis not present

## 2019-08-07 DIAGNOSIS — R262 Difficulty in walking, not elsewhere classified: Secondary | ICD-10-CM | POA: Diagnosis not present

## 2019-08-07 DIAGNOSIS — Z4789 Encounter for other orthopedic aftercare: Secondary | ICD-10-CM | POA: Diagnosis not present

## 2019-08-10 DIAGNOSIS — Z20828 Contact with and (suspected) exposure to other viral communicable diseases: Secondary | ICD-10-CM | POA: Diagnosis not present

## 2019-08-14 DIAGNOSIS — Z20828 Contact with and (suspected) exposure to other viral communicable diseases: Secondary | ICD-10-CM | POA: Diagnosis not present

## 2019-08-17 DIAGNOSIS — Z20828 Contact with and (suspected) exposure to other viral communicable diseases: Secondary | ICD-10-CM | POA: Diagnosis not present

## 2019-08-19 DIAGNOSIS — L602 Onychogryphosis: Secondary | ICD-10-CM | POA: Diagnosis not present

## 2019-08-19 DIAGNOSIS — I739 Peripheral vascular disease, unspecified: Secondary | ICD-10-CM | POA: Diagnosis not present

## 2019-08-21 DIAGNOSIS — Z20828 Contact with and (suspected) exposure to other viral communicable diseases: Secondary | ICD-10-CM | POA: Diagnosis not present

## 2019-08-23 ENCOUNTER — Ambulatory Visit: Payer: Medicare Other | Admitting: Orthopaedic Surgery

## 2019-10-23 ENCOUNTER — Emergency Department (HOSPITAL_COMMUNITY): Payer: Medicare Other

## 2019-10-23 ENCOUNTER — Other Ambulatory Visit: Payer: Self-pay

## 2019-10-23 ENCOUNTER — Emergency Department (HOSPITAL_COMMUNITY)
Admission: EM | Admit: 2019-10-23 | Discharge: 2019-10-23 | Disposition: A | Discharge status: Home / Self Care | Payer: Medicare Other | Attending: Emergency Medicine | Admitting: Emergency Medicine

## 2019-10-23 ENCOUNTER — Encounter (HOSPITAL_COMMUNITY): Payer: Self-pay | Admitting: *Deleted

## 2019-10-23 DIAGNOSIS — Z96642 Presence of left artificial hip joint: Secondary | ICD-10-CM | POA: Diagnosis not present

## 2019-10-23 DIAGNOSIS — Y92129 Unspecified place in nursing home as the place of occurrence of the external cause: Secondary | ICD-10-CM | POA: Diagnosis not present

## 2019-10-23 DIAGNOSIS — Z79899 Other long term (current) drug therapy: Secondary | ICD-10-CM | POA: Insufficient documentation

## 2019-10-23 DIAGNOSIS — S0990XA Unspecified injury of head, initial encounter: Secondary | ICD-10-CM | POA: Diagnosis present

## 2019-10-23 DIAGNOSIS — W0110XA Fall on same level from slipping, tripping and stumbling with subsequent striking against unspecified object, initial encounter: Secondary | ICD-10-CM | POA: Diagnosis not present

## 2019-10-23 DIAGNOSIS — Y939 Activity, unspecified: Secondary | ICD-10-CM | POA: Insufficient documentation

## 2019-10-23 DIAGNOSIS — S0101XA Laceration without foreign body of scalp, initial encounter: Secondary | ICD-10-CM | POA: Insufficient documentation

## 2019-10-23 DIAGNOSIS — F0390 Unspecified dementia without behavioral disturbance: Secondary | ICD-10-CM | POA: Diagnosis not present

## 2019-10-23 DIAGNOSIS — Y999 Unspecified external cause status: Secondary | ICD-10-CM | POA: Insufficient documentation

## 2019-10-23 DIAGNOSIS — W19XXXA Unspecified fall, initial encounter: Secondary | ICD-10-CM

## 2019-10-23 MED ORDER — LIDOCAINE-EPINEPHRINE (PF) 2 %-1:200000 IJ SOLN
20.0000 mL | Freq: Once | INTRAMUSCULAR | Status: AC
  Administered 2019-10-23: 20 mL via INTRADERMAL
  Filled 2019-10-23: qty 20

## 2019-10-23 NOTE — Discharge Instructions (Signed)
Follow-up with your doctor here or urgent care in 5 to 7 days to have your staples removed.  He is return for redness drainage or if you get a fever.  The area is okay to get wet but do not fully immerse it is no swimming or scuba diving.

## 2019-10-23 NOTE — ED Provider Notes (Signed)
[facility] DEPT Provider Note   CSN: [id number] Arrival date & time: 10/23/19  1820     History Chief Complaint  Patient presents with  . Fall  . Laceration    [name] [name] is a 86 y.o. male.  86 yo M with a chief complaint of a fall.  Patient is demented and has difficulty providing history.  Sounds nonsyncopal from what he is able to provide.  [place] and struck the left occipital area.  Told me he could get the bleeding controlled and that is why he sought medical care.  Patient denies any other injury in the fall denies chest pain denies shortness of breath denies abdominal pain denies extremity pain.  Denies neck pain.  Level 5 caveat dementia.  The history is provided by the patient.  Fall This is a new problem. The current episode started 3 to 5 hours ago. The problem occurs rarely. The problem has been resolved. Pertinent negatives include no chest pain, no abdominal pain, no headaches and no shortness of breath. Nothing aggravates the symptoms. Nothing relieves the symptoms. He has tried nothing for the symptoms. The treatment provided no relief.  Laceration Associated symptoms: no fever and no rash        Past Medical History:  Diagnosis Date  . Allergic rhinitis, cause unspecified   . Anxiety state, unspecified   . Arthritis   . Benign neoplasm of colon   . Depressive disorder, not elsewhere classified   . Esophageal reflux   . Hyperlipidemia   . Irritable bowel syndrome   . Obstructive sleep apnea (adult) (pediatric)   . Other chronic allergic conjunctivitis   . Ulcer 1951   stomach    Patient Active Problem List   Diagnosis Date Noted  . Closed displaced fracture of right femoral neck ([place]) 02/19/2019  . Hip fracture ([place]) 02/19/2019  . Closed right hip fracture ([place]) 02/19/2019  . Evaluation by psychiatric service required   . Hypoxia 02/13/2018  . Edema 02/13/2018  . Hypoxemia 02/13/2018  . Dementia ([place])  02/13/2018  . Altered mental status 02/13/2018  . Dermatitis 10/08/2015  . CHEST PAIN, ATYPICAL 04/11/2010  . Personal history of colon polyps and family history of colon cancer 12/19/2007  . Anxiety state 12/19/2007  . DEPRESSION 12/19/2007  . Bicipital tendinitis, left shoulder 12/19/2007  . ALLERGIC CONJUNCTIVITIS 12/19/2007  . Seasonal allergic rhinitis 12/19/2007  . GERD 12/19/2007  . IBS 12/19/2007    Past Surgical History:  Procedure Laterality Date  . ANTERIOR APPROACH HEMI HIP ARTHROPLASTY Left 03/17/2019   Procedure: ANTERIOR APPROACH HEMI HIP ARTHROPLASTY;  Surgeon: Leandrew Koyanagi, MD;  Location: [facility];  Service: Orthopedics;  Laterality: Left;  . APPENDECTOMY    . CATARACT EXTRACTION W/ INTRAOCULAR LENS  IMPLANT, BILATERAL  2003  . COLONOSCOPY    . ESOPHAGOGASTRODUODENOSCOPY    . HIP PINNING,CANNULATED Right 02/20/2019   Procedure: CANNULATED HIP PINNING;  Surgeon: Leandrew Koyanagi, MD;  Location: [facility];  Service: Orthopedics;  Laterality: Right;  . TONSILLECTOMY         Family History  Problem Relation Age of Onset  . Colon cancer Father   . Stroke Mother   . Heart attack Mother   . Colon cancer Mother   . Rectal cancer Neg Hx   . Stomach cancer Neg Hx     Social History   Tobacco Use  . Smoking status: Never Smoker  . Smokeless tobacco: Never Used  Substance Use Topics  .  Alcohol use: No  . Drug use: No    Home Medications Prior to Admission medications   Medication Sig Start Date End Date Taking? Authorizing Provider  acetaminophen (TYLENOL) 325 MG tablet Take 2 tablets (650 mg total) by mouth every 6 (six) hours as needed for mild pain, fever or headache (pain score 1-3 or temp > 100.5). 03/22/19   [place] Donnamarie Poag, DO  docusate sodium (COLACE) 100 MG capsule Take 1 capsule (100 mg total) by mouth 2 (two) times daily. 03/22/19   [place] Eric J, DO  enoxaparin (LOVENOX) 40 MG/0.4ML injection Inject 0.4 mLs (40 mg total) into the skin daily. 03/17/19   Leandrew Koyanagi, MD  feeding supplement, ENSURE ENLIVE, (ENSURE ENLIVE) LIQD Take 237 mLs by mouth 2 (two) times daily between meals. 02/24/19   Dana Allan I, MD  hydrocortisone 2.5 % cream Apply 1 application topically 2 (two) times daily. Apply to rash on right arm    [provider]  LORazepam (ATIVAN) 0.5 MG tablet Take 1 tablet (0.5 mg total) by mouth every 12 (twelve) hours as needed (agitation). 03/22/19   [place] Donnamarie Poag, DO  methocarbamol (ROBAXIN) 500 MG tablet Take 1 tablet (500 mg total) by mouth every 6 (six) hours as needed for muscle spasms. 03/22/19   [place] Donnamarie Poag, DO  Multiple Vitamin (MULTIVITAMIN WITH MINERALS) TABS tablet Take 1 tablet by mouth daily. 02/25/19   Bonnell Public, MD  neomycin-bacitracin-polymyxin (NEOSPORIN) [phone] ointment Apply 1 application topically every evening. Apply to left wrist for skin tear    [provider]  nystatin cream (MYCOSTATIN) Apply 1 application topically 2 (two) times daily. Apply to groin/penis for yeast    [provider]  oxyCODONE-acetaminophen (PERCOCET) 5-325 MG tablet Take 1-2 tablets by mouth every 8 (eight) hours as needed for severe pain. 03/17/19   Leandrew Koyanagi, MD  polyethylene glycol (MIRALAX / GLYCOLAX) 17 g packet Take 17 g by mouth daily as needed for mild constipation. 03/22/19   [place] Donnamarie Poag, DO  traZODone (DESYREL) 50 MG tablet Take 1 tablet (50 mg total) by mouth at bedtime. 03/22/19   [place] Donnamarie Poag, DO    Allergies    Codeine, Penicillins, Ranitidine, and Amoxicillin  Review of Systems   Review of Systems  Constitutional: Negative for chills and fever.  HENT: Negative for congestion and facial swelling.   Eyes: Negative for discharge and visual disturbance.  Respiratory: Negative for shortness of breath.   Cardiovascular: Negative for chest pain and palpitations.  Gastrointestinal: Negative for abdominal pain, diarrhea and vomiting.  Musculoskeletal: Negative for arthralgias  and myalgias.  Skin: Positive for wound. Negative for color change and rash.  Neurological: Negative for tremors, syncope and headaches.  Psychiatric/Behavioral: Negative for confusion and dysphoric mood.    Physical Exam Updated Vital Signs BP (!) 157/94   Pulse 62   Temp 98 F (36.7 C) (Oral)   Resp 17   SpO2 94%   Physical Exam Vitals and nursing note reviewed.  Constitutional:      Appearance: He is well-developed.  HENT:     Head: Normocephalic.     Comments: Approximately 4 cm laceration to the left occiput. Eyes:     Pupils: Pupils are equal, round, and reactive to light.  Neck:     Vascular: No JVD.  Cardiovascular:     Rate and Rhythm: Normal rate and regular rhythm.     Heart sounds: No murmur. No friction rub. No gallop.  Pulmonary:     Effort: No respiratory distress.     Breath sounds: No wheezing.  Abdominal:     General: There is no distension.     Tenderness: There is no guarding or rebound.  Musculoskeletal:        General: Normal range of motion.     Cervical back: Normal range of motion and neck supple.     Comments: Palpated from head to toe without any obvious areas of bony tenderness.  No bruising.  Skin:    Coloration: Skin is not pale.     Findings: No rash.  Neurological:     Mental Status: He is alert and oriented to person, place, and time.  Psychiatric:        Behavior: Behavior normal.     ED Results / Procedures / Treatments   Labs (all labs ordered are listed, but only abnormal results are displayed) Labs Reviewed - No data to display  EKG None  Radiology CT Head Wo Contrast  Result Date: 10/23/2019 CLINICAL DATA:  Un witnessed fall, posterior head laceration, dementia EXAM: CT HEAD WITHOUT CONTRAST TECHNIQUE: Contiguous axial images were obtained from the base of the skull through the vertex without intravenous contrast. COMPARISON:  07/24/2019 FINDINGS: Brain: Chronic small vessel ischemic changes are seen throughout the  periventricular white matter. No acute infarct or hemorrhage. Lateral ventricles and midline structures are stable, with bilateral basal ganglia calcifications again noted. No acute extra-axial fluid collections. No mass effect. Vascular: No hyperdense vessel or unexpected calcification. Skull: Normal. Negative for fracture or focal lesion. Sinuses/Orbits: No acute finding. Other: None IMPRESSION: 1. Stable head CT, no acute process. Electronically Signed   By: Randa Ngo M.D.   On: 10/23/2019 19:23    Procedures .Marland KitchenLaceration Repair  Date/Time: 10/23/2019 7:51 PM Performed by: Deno Etienne, DO Authorized by: Deno Etienne, DO   Consent:    Consent obtained:  Verbal   Consent given by:  Patient   Risks discussed:  Infection, pain, poor cosmetic result and poor wound healing   Alternatives discussed:  No treatment and observation Anesthesia (see MAR for exact dosages):    Anesthesia method:  Local infiltration   Local anesthetic:  Lidocaine 2% WITH epi Laceration details:    Location:  Scalp   Scalp location:  Occipital   Length (cm):  3.2 Repair type:    Repair type:  Simple Pre-procedure details:    Preparation:  Imaging obtained to evaluate for foreign bodies Exploration:    Hemostasis achieved with:  Epinephrine and direct pressure   Wound exploration: entire depth of wound probed and visualized     Wound extent: no underlying fracture noted     Contaminated: no   Treatment:    Area cleansed with:  Saline   Amount of cleaning:  Extensive   Irrigation solution:  Sterile saline   Irrigation volume:  50   Irrigation method:  Syringe   Visualized foreign bodies/material removed: no   Skin repair:    Repair method:  Staples   Number of staples:  2 Approximation:    Approximation:  Close Post-procedure details:    Dressing:  Open (no dressing)   Patient tolerance of procedure:  Tolerated well, no immediate complications   (including critical care time)  Medications Ordered  in ED Medications  lidocaine-EPINEPHrine (XYLOCAINE W/EPI) 2 %-1:200000 (PF) injection 20 mL (20 mLs Intradermal Given by Other 10/23/19 1841)    ED Course  I have reviewed the triage vital signs and  the nursing notes.  Pertinent labs & imaging results that were available during my care of the patient were reviewed by me and considered in my medical decision making (see chart for details).    MDM Rules/Calculators/A&P                      86 yo M with a cc of fall.  Sounds nonsyncopal by history.  Will obtain a CT scan of the head.  Repair wound at bedside.  Discharge home.  7:52 PM:  I have discussed the diagnosis/risks/treatment options with the patient and believe the pt to be eligible for discharge home to follow-up with PCP. We also discussed returning to the ED immediately if new or worsening sx occur. We discussed the sx which are most concerning (e.g., sudden worsening pain, fever, inability to tolerate by mouth) that necessitate immediate return. Medications administered to the patient during their visit and any new prescriptions provided to the patient are listed below.  Medications given during this visit Medications  lidocaine-EPINEPHrine (XYLOCAINE W/EPI) 2 %-1:200000 (PF) injection 20 mL (20 mLs Intradermal Given by Other 10/23/19 1841)     The patient appears reasonably screen and/or stabilized for discharge and I doubt any other medical condition or other [facility] requiring further screening, evaluation, or treatment in the ED at this time prior to discharge.   Final Clinical Impression(s) / ED Diagnoses Final diagnoses:  Fall, initial encounter  Laceration of scalp, initial encounter    Rx / DC Orders ED Discharge Orders    None       Deno Etienne, DO 10/23/19 1952

## 2019-10-23 NOTE — ED Triage Notes (Addendum)
BIB EMS from [facility], unwitnessed fall with laceration to posterior left head. Unknown LOC, facility stated pt was at his normal baseline, pt has dementia alert to person. C collar applied, no thinners, 158/90-70-20-99% RA CBG 234

## 2019-10-23 NOTE — ED Notes (Signed)
[facility] called for transport.  

## 2019-12-07 IMAGING — CR DG HIP (WITH OR WITHOUT PELVIS) 2-3V LEFT
3 series · 3 of 3 positions shown · non-contrast
Comparison: March 03, 2019

CLINICAL DATA: Pain

EXAM:
DG HIP (WITH OR WITHOUT PELVIS) 2-3V LEFT

[w hip lat left]
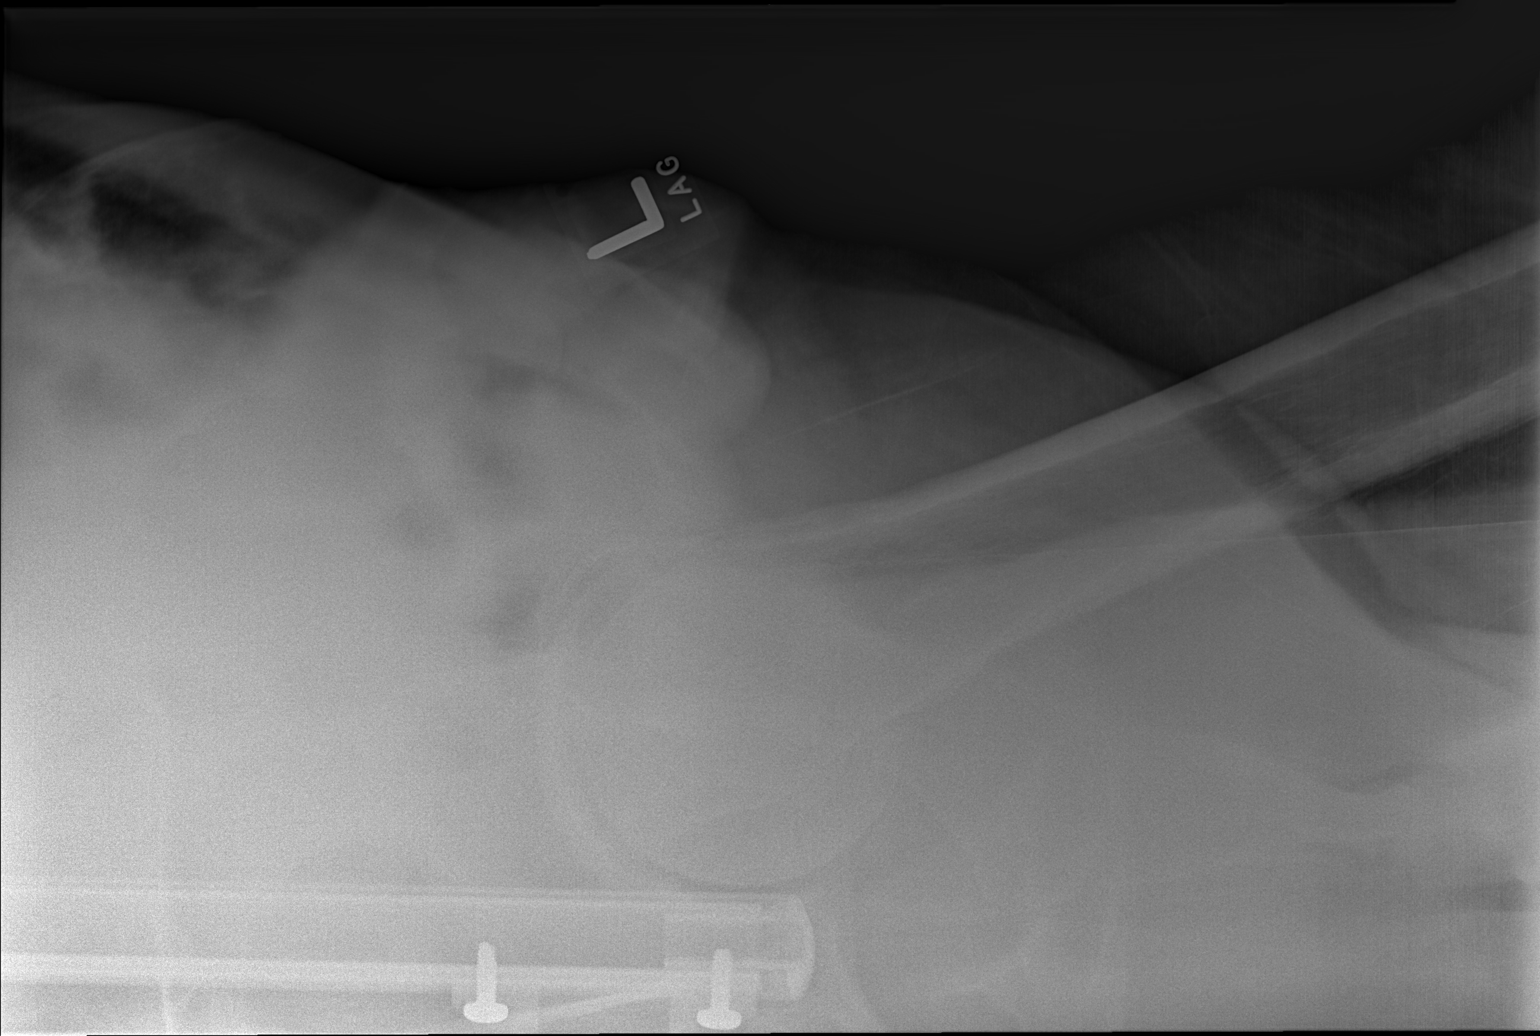

[x pelvis]
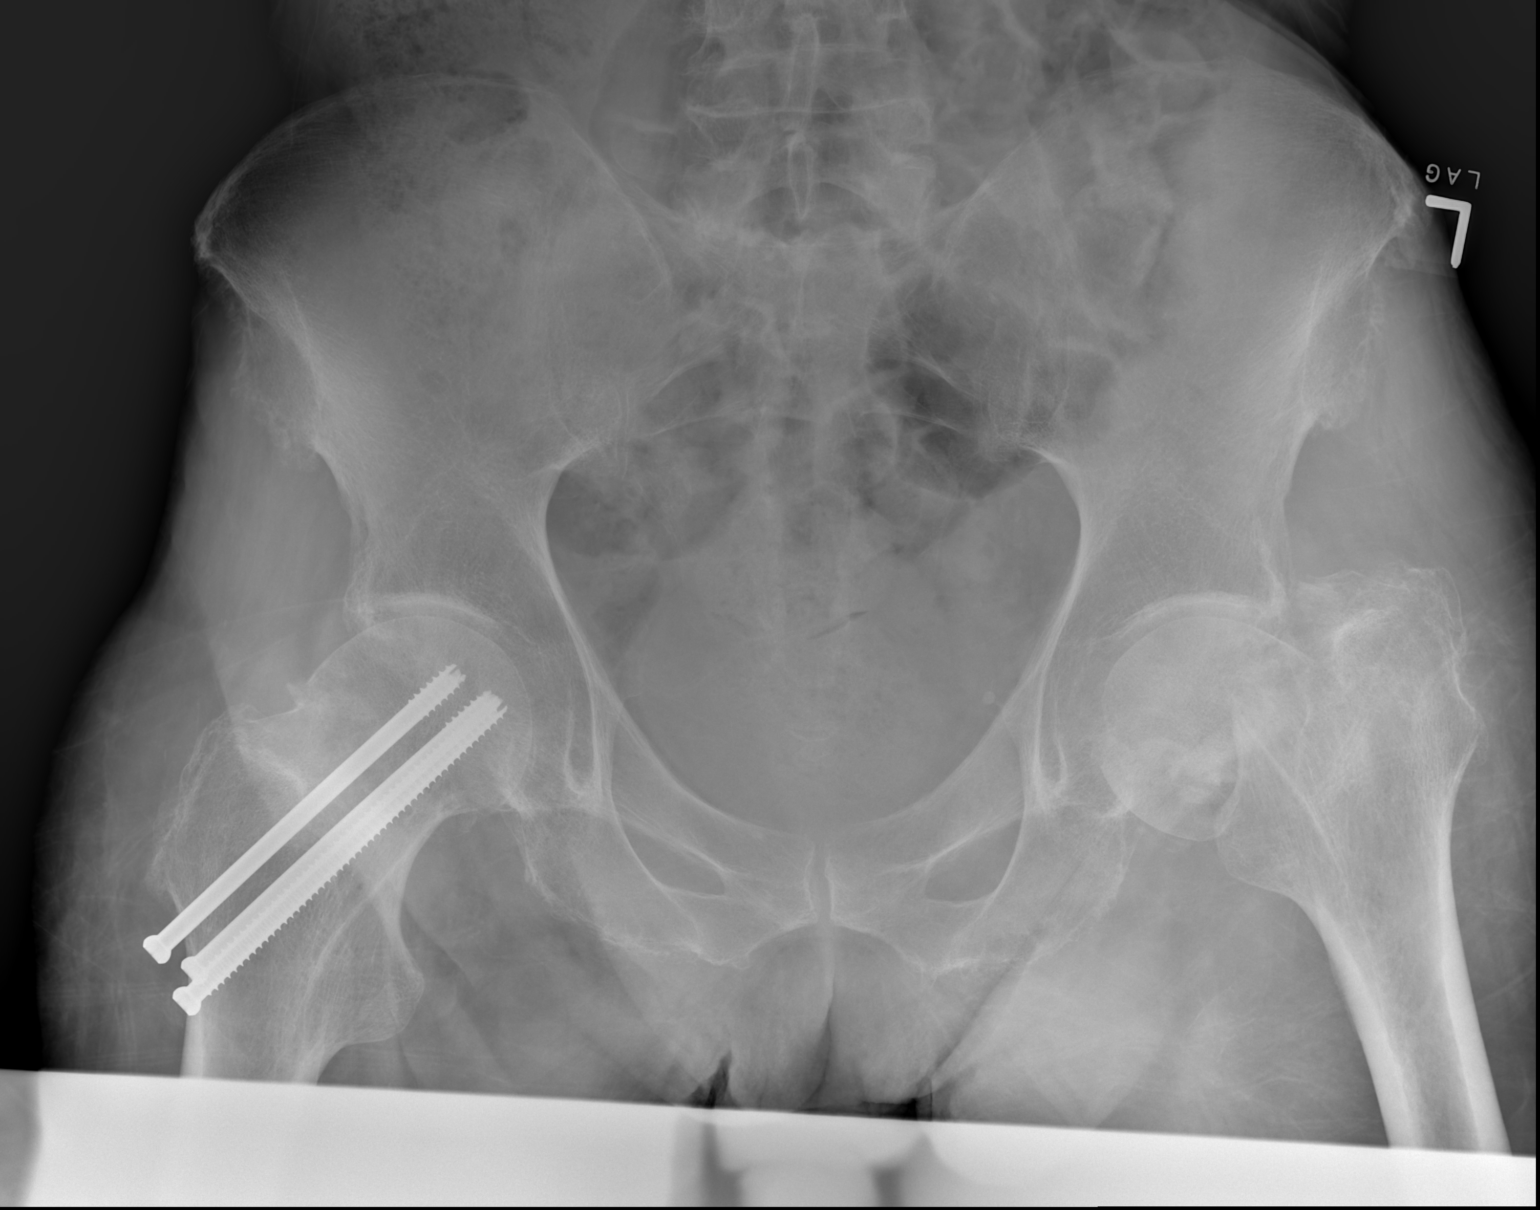

[x hip ap left]
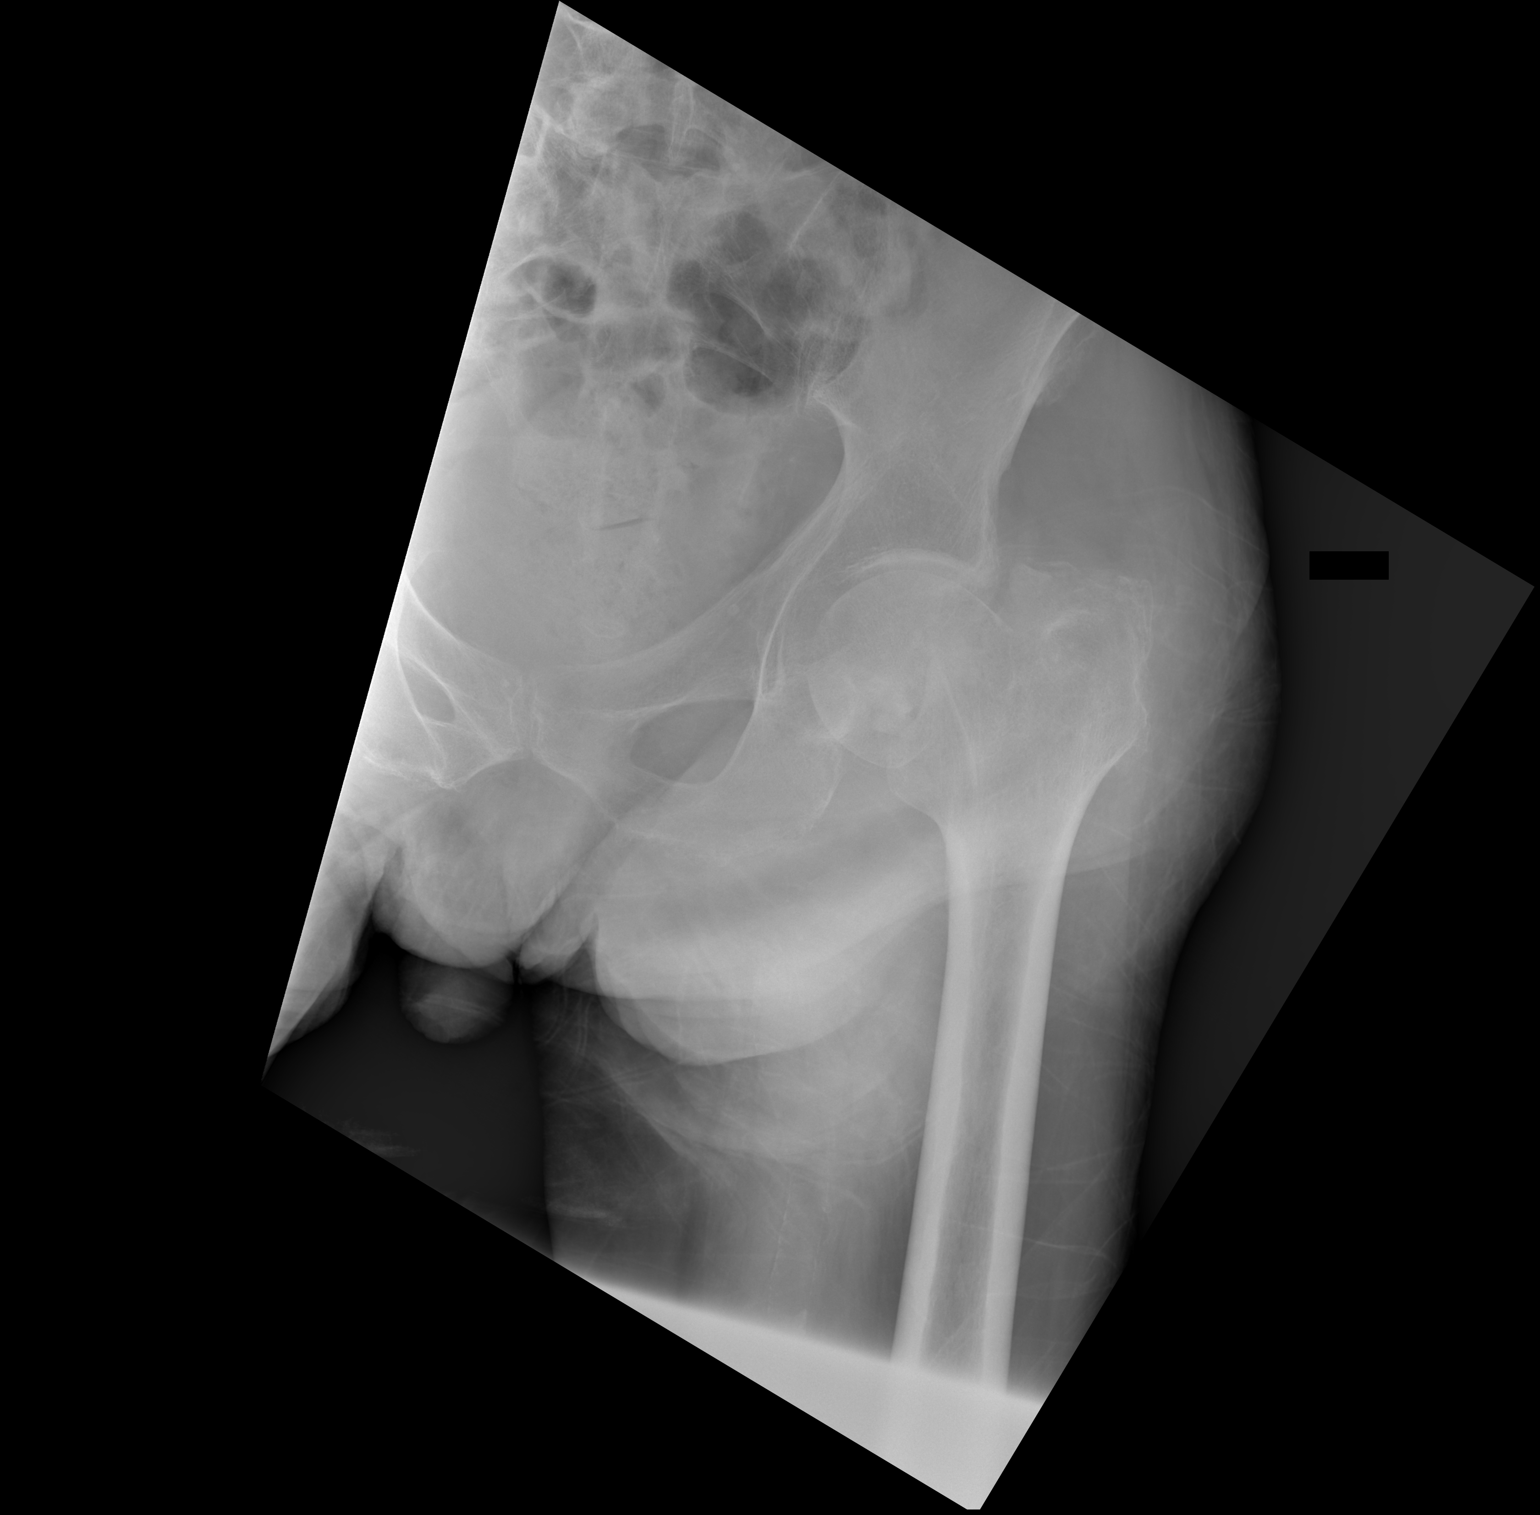

[3 of 3 positions shown; findings below may reference images not displayed]

FINDINGS: Again noted is a transcervical fracture of the proximal left femur
with interval increase in displacement. There is surrounding soft
tissue swelling. There is no dislocation. The patient is status post
prior percutaneous pinning of the right hip.
IMPRESSION: Acute fracture of the proximal left femur with significant interval
displacement when compared to prior study.

## 2020-07-15 IMAGING — CT CT HEAD W/O CM
3 of 4 series · 15 of 47 positions shown, 18 images · non-contrast
Comparison: 07/24/2019

CLINICAL DATA: Un witnessed fall, posterior head laceration,
dementia

EXAM:
CT HEAD WITHOUT CONTRAST
TECHNIQUE: Contiguous axial images were obtained from the base of the skull
through the vertex without intravenous contrast.

[Series 5: coronal soft tissue · coronal · 0.32mm/px · 3 of 69 slices shown]
[im 23/69  brain]
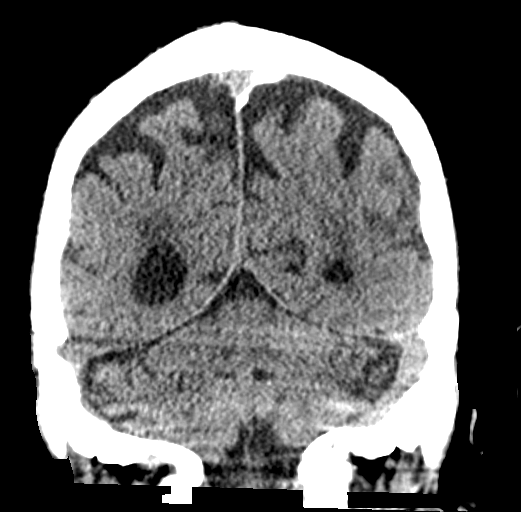
[im 31/69  brain]
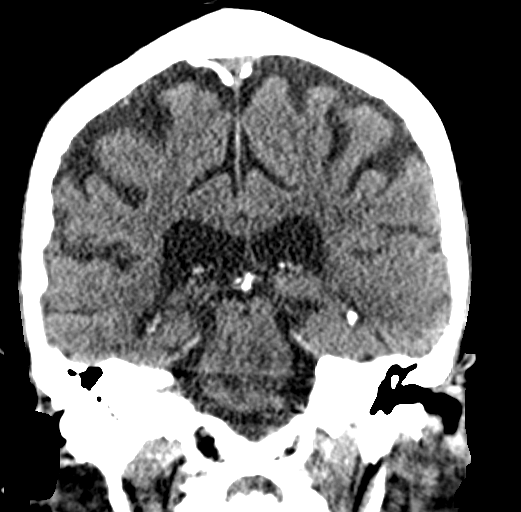
[im 38/69  brain]
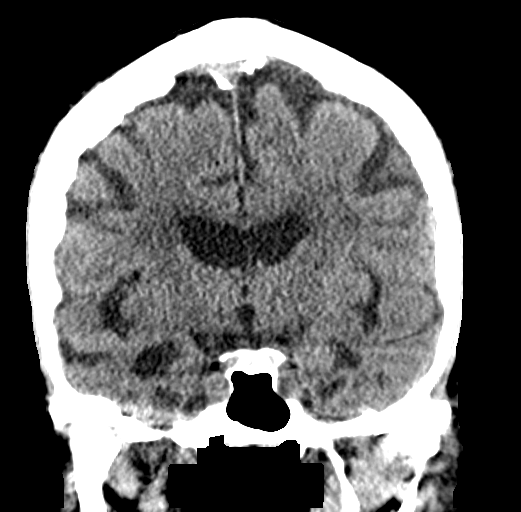

[Series 6: sagittal soft tissue · sagittal · 0.32mm/px · 3 of 56 slices shown]
[im 19/56  brain]
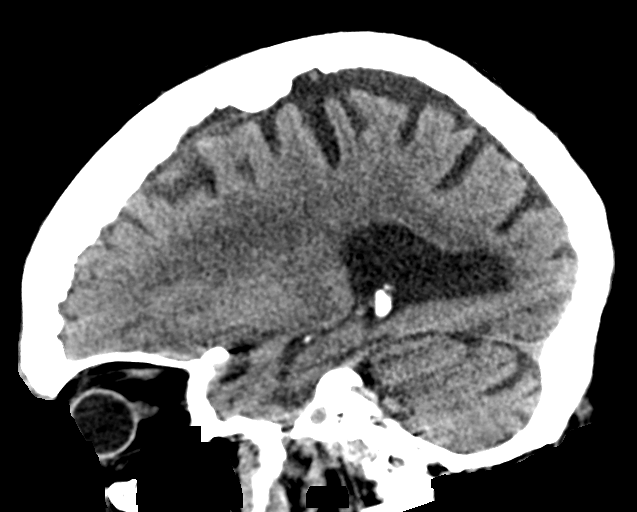
[im 28/56  brain]
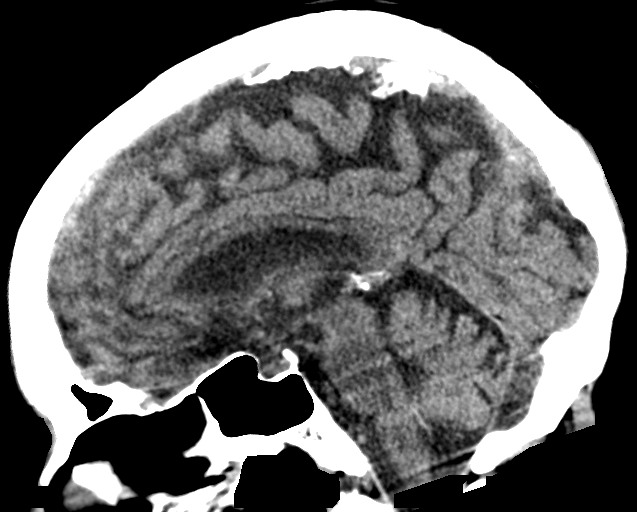
[im 37/56  brain]
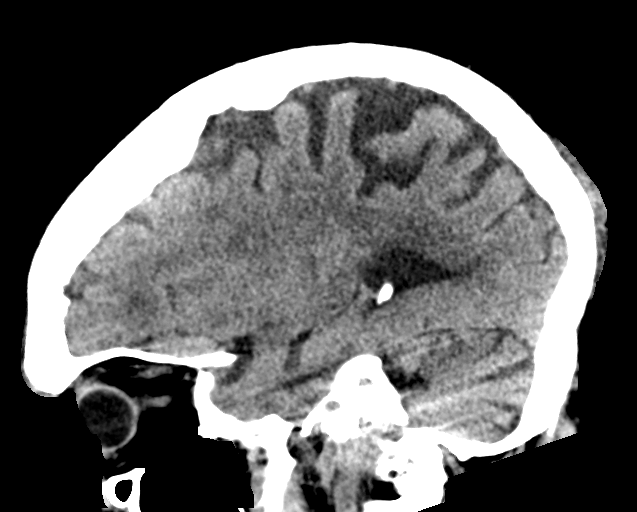

[Series 7: axial soft tissue · axial · 0.35mm/px · z∈[-252,-116]mm · 9 of 55 slices shown, 12 images]
[im 4/55  brain]
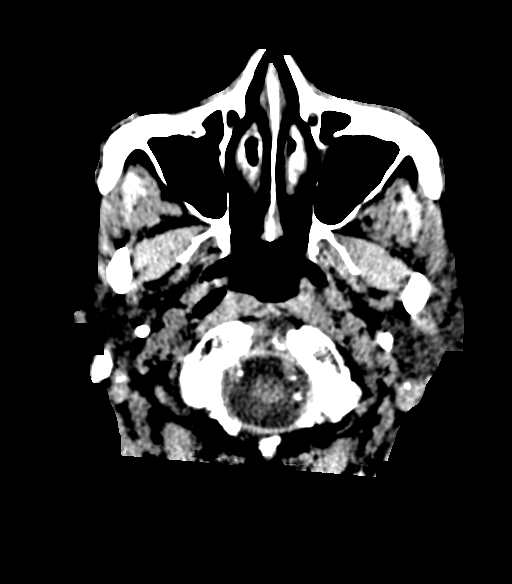
[im 4/55  bone]
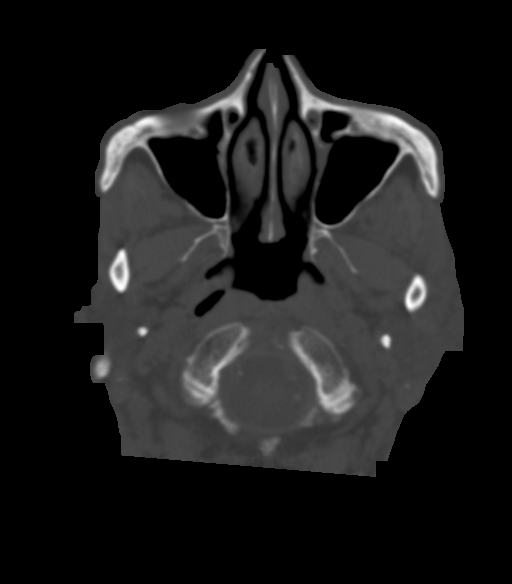
[im 10/55  brain]
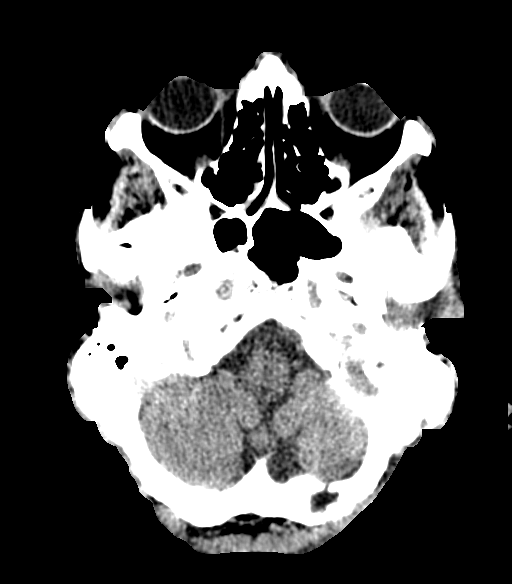
[im 16/55  brain]
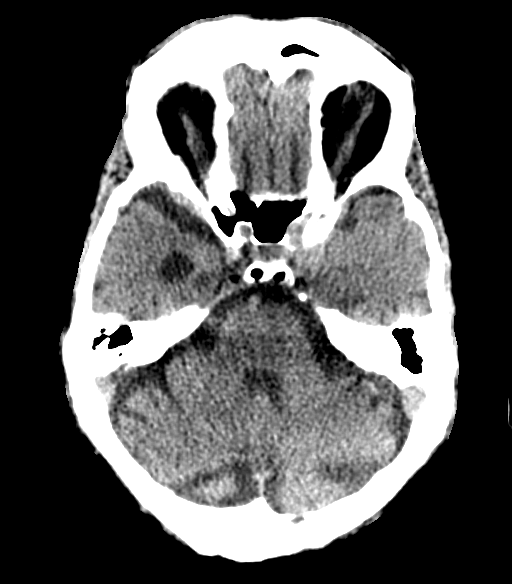
[im 22/55  brain]
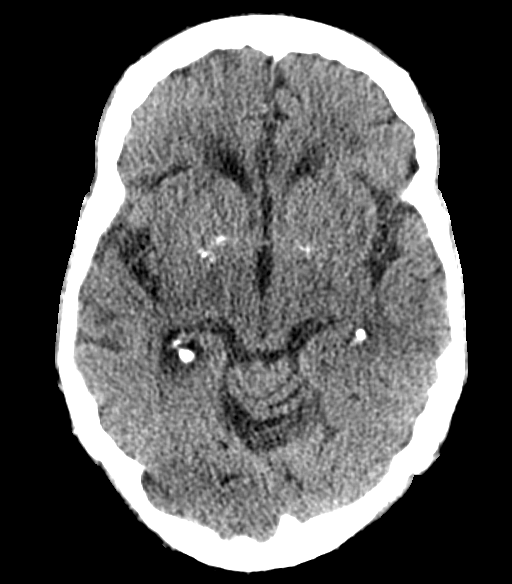
[im 28/55  brain]
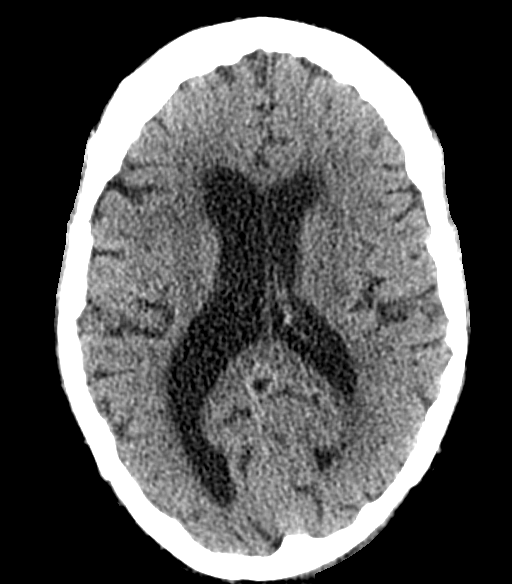
[im 28/55  bone]
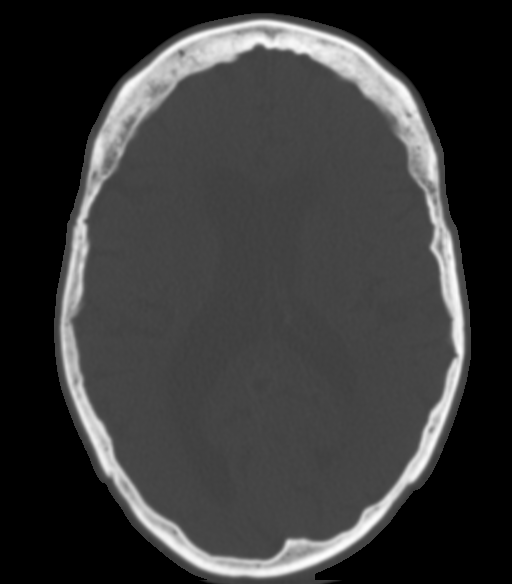
[im 34/55  brain]
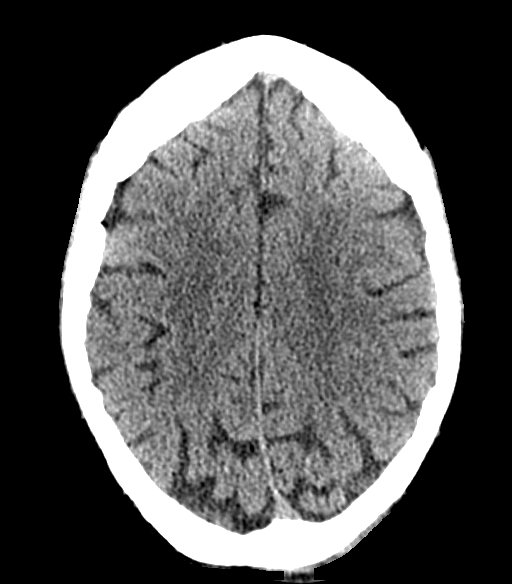
[im 40/55  brain]
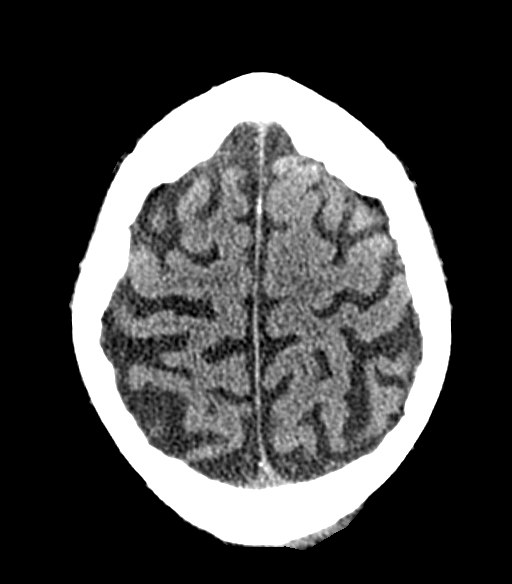
[im 46/55  brain]
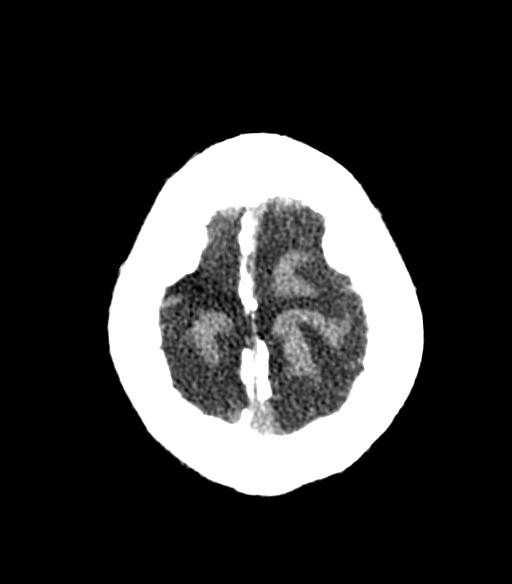
[im 52/55  brain]
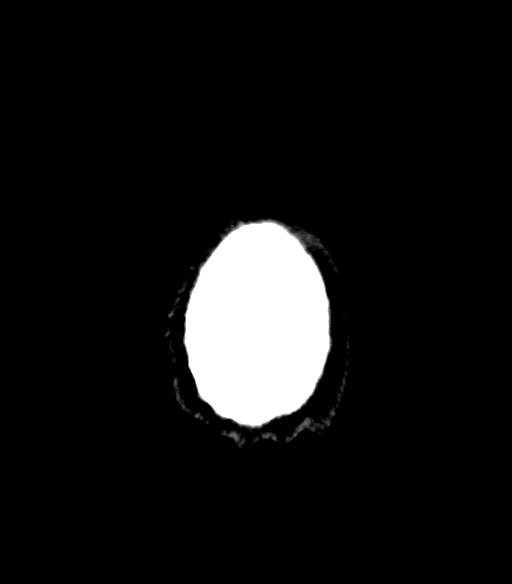
[im 52/55  bone]
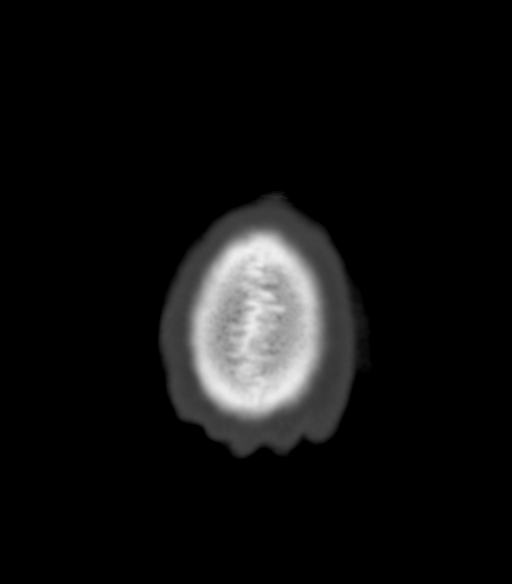

[15 of 47 positions shown; findings below may reference images not displayed]

FINDINGS: Brain: Chronic small vessel ischemic changes are seen throughout the
periventricular white matter. No acute infarct or hemorrhage.
Lateral ventricles and midline structures are stable, with bilateral
basal ganglia calcifications again noted. No acute extra-axial fluid
collections. No mass effect.

Vascular: No hyperdense vessel or unexpected calcification.

Skull: Normal. Negative for fracture or focal lesion.

Sinuses/Orbits: No acute finding.

Other: None
IMPRESSION: 1. Stable head CT, no acute process.

## 2020-08-18 ENCOUNTER — Emergency Department (HOSPITAL_COMMUNITY): Payer: Medicare Other

## 2020-08-18 ENCOUNTER — Emergency Department (HOSPITAL_COMMUNITY)
Admission: EM | Admit: 2020-08-18 | Discharge: 2020-08-18 | Disposition: A | Discharge status: Home / Self Care | Payer: Medicare Other | Attending: Emergency Medicine | Admitting: Emergency Medicine

## 2020-08-18 ENCOUNTER — Encounter (HOSPITAL_COMMUNITY): Payer: Self-pay

## 2020-08-18 ENCOUNTER — Other Ambulatory Visit: Payer: Self-pay

## 2020-08-18 DIAGNOSIS — Z96642 Presence of left artificial hip joint: Secondary | ICD-10-CM | POA: Diagnosis not present

## 2020-08-18 DIAGNOSIS — W19XXXA Unspecified fall, initial encounter: Secondary | ICD-10-CM

## 2020-08-18 DIAGNOSIS — S0101XA Laceration without foreign body of scalp, initial encounter: Secondary | ICD-10-CM | POA: Diagnosis not present

## 2020-08-18 DIAGNOSIS — F0390 Unspecified dementia without behavioral disturbance: Secondary | ICD-10-CM | POA: Diagnosis not present

## 2020-08-18 DIAGNOSIS — Z85038 Personal history of other malignant neoplasm of large intestine: Secondary | ICD-10-CM | POA: Diagnosis not present

## 2020-08-18 DIAGNOSIS — W1830XA Fall on same level, unspecified, initial encounter: Secondary | ICD-10-CM | POA: Insufficient documentation

## 2020-08-18 DIAGNOSIS — S0990XA Unspecified injury of head, initial encounter: Secondary | ICD-10-CM | POA: Diagnosis present

## 2020-08-18 LAB — BASIC METABOLIC PANEL
Anion gap: 8 (ref 5–15)
BUN: 31 mg/dL — ABNORMAL HIGH (ref 8–23)
CO2: 28 mmol/L (ref 22–32)
Calcium: 8.4 mg/dL — ABNORMAL LOW (ref 8.9–10.3)
Chloride: 101 mmol/L (ref 98–111)
Creatinine, Ser: 0.9 mg/dL (ref 0.61–1.24)
GFR, Estimated: >60 mL/min (ref >60)
Glucose, Bld: 104 mg/dL — ABNORMAL HIGH (ref 70–99)
Potassium: 4.5 mmol/L (ref 3.5–5.1)
Sodium: 137 mmol/L (ref 135–145)

## 2020-08-18 LAB — URINALYSIS, ROUTINE W REFLEX MICROSCOPIC
Bilirubin Urine: NEGATIVE
Glucose, UA: NEGATIVE mg/dL
Hgb urine dipstick: NEGATIVE
Ketones, ur: NEGATIVE mg/dL
Leukocytes,Ua: NEGATIVE
Nitrite: NEGATIVE
Protein, ur: NEGATIVE mg/dL
Specific Gravity, Urine: 1.009 (ref 1.005–1.030)
pH: 7 (ref 5.0–8.0)

## 2020-08-18 LAB — CBC WITH DIFFERENTIAL/PLATELET
Abs Immature Granulocytes: 0.01 10*3/uL (ref 0.00–0.07)
Basophils Absolute: 0.1 10*3/uL (ref 0.0–0.1)
Basophils Relative: 1 %
Eosinophils Absolute: 0.8 10*3/uL — ABNORMAL HIGH (ref 0.0–0.5)
Eosinophils Relative: 17 %
HCT: 36.2 % — ABNORMAL LOW (ref 39.0–52.0)
Hemoglobin: 11.7 g/dL — ABNORMAL LOW (ref 13.0–17.0)
Immature Granulocytes: 0 %
Lymphocytes Relative: 18 %
Lymphs Abs: 0.9 10*3/uL (ref 0.7–4.0)
MCH: 29.7 pg (ref 26.0–34.0)
MCHC: 32.3 g/dL (ref 30.0–36.0)
MCV: 91.9 fL (ref 80.0–100.0)
Monocytes Absolute: 1 10*3/uL (ref 0.1–1.0)
Monocytes Relative: 19 %
Neutro Abs: 2.2 10*3/uL (ref 1.7–7.7)
Neutrophils Relative %: 45 %
Platelets: 201 10*3/uL (ref 150–400)
RBC: 3.94 MIL/uL — ABNORMAL LOW (ref 4.22–5.81)
RDW: 13.1 % (ref 11.5–15.5)
WBC: 5 10*3/uL (ref 4.0–10.5)
nRBC: 0 % (ref 0.0–0.2)

## 2020-08-18 NOTE — ED Triage Notes (Signed)
unwitnessed fall. Sitting on ground against couch on ems arrival. Back of head right side laceration noted. No bleeding noted. -blood thinners. c-collar in place. Baseline mental status per facility.

## 2020-08-18 NOTE — ED Notes (Signed)
Bed alarm placed by tech.

## 2020-08-18 NOTE — ED Notes (Signed)
Called facility with no answer. Voicemail left. Called [facility] and asked how patient is normally transported back. [facility] states they will call facility and if facility cant get him then he will

## 2020-08-18 NOTE — ED Notes (Signed)
Called [facility] and updated

## 2020-08-18 NOTE — ED Notes (Signed)
[facility] called and said just send him back by [facility]. Still unable to reach facility

## 2020-08-18 NOTE — Discharge Instructions (Addendum)
Your staple in your head will have to be removed in 5 to 7 days. Continue taking home medications as prescribed. Follow-up with your primary care doctor as needed for recheck of symptoms. Return to the emergency room with any new, worsening, or concerning symptoms.

## 2020-08-18 NOTE — ED Provider Notes (Signed)
[facility] DEPT Provider Note   CSN: [id number] Arrival date & time: 08/18/20  1528     History Chief Complaint  Patient presents with  . Fall    [name] [name] is a 86 y.o. male presenting for evaluation of head injury.  Level 5 caveat due to dementia.  Per triage note, patient was found sitting next to his bed, presumed he had a fall.  Head injury noted. Event was unwitnessed.  He was at baseline mental status.  He is not on blood thinners.  Patient denies pain.  HPI     Past Medical History:  Diagnosis Date  . Allergic rhinitis, cause unspecified   . Anxiety state, unspecified   . Arthritis   . Benign neoplasm of colon   . Depressive disorder, not elsewhere classified   . Esophageal reflux   . Hyperlipidemia   . Irritable bowel syndrome   . Obstructive sleep apnea (adult) (pediatric)   . Other chronic allergic conjunctivitis   . Ulcer 1951   stomach    Patient Active Problem List   Diagnosis Date Noted  . Closed displaced fracture of right femoral neck ([place]) 02/19/2019  . Hip fracture ([place]) 02/19/2019  . Closed right hip fracture ([place]) 02/19/2019  . Evaluation by psychiatric service required   . Hypoxia 02/13/2018  . Edema 02/13/2018  . Hypoxemia 02/13/2018  . Dementia ([place]) 02/13/2018  . Altered mental status 02/13/2018  . Dermatitis 10/08/2015  . CHEST PAIN, ATYPICAL 04/11/2010  . Personal history of colon polyps and family history of colon cancer 12/19/2007  . Anxiety state 12/19/2007  . DEPRESSION 12/19/2007  . Bicipital tendinitis, left shoulder 12/19/2007  . ALLERGIC CONJUNCTIVITIS 12/19/2007  . Seasonal allergic rhinitis 12/19/2007  . GERD 12/19/2007  . IBS 12/19/2007    Past Surgical History:  Procedure Laterality Date  . ANTERIOR APPROACH HEMI HIP ARTHROPLASTY Left 03/17/2019   Procedure: ANTERIOR APPROACH HEMI HIP ARTHROPLASTY;  Surgeon: Leandrew Koyanagi, MD;  Location: [facility];  Service: Orthopedics;   Laterality: Left;  . APPENDECTOMY    . CATARACT EXTRACTION W/ INTRAOCULAR LENS  IMPLANT, BILATERAL  2003  . COLONOSCOPY    . ESOPHAGOGASTRODUODENOSCOPY    . HIP PINNING,CANNULATED Right 02/20/2019   Procedure: CANNULATED HIP PINNING;  Surgeon: Leandrew Koyanagi, MD;  Location: [facility];  Service: Orthopedics;  Laterality: Right;  . TONSILLECTOMY         Family History  Problem Relation Age of Onset  . Colon cancer Father   . Stroke Mother   . Heart attack Mother   . Colon cancer Mother   . Rectal cancer Neg Hx   . Stomach cancer Neg Hx     Social History   Tobacco Use  . Smoking status: Never Smoker  . Smokeless tobacco: Never Used  Substance Use Topics  . Alcohol use: No  . Drug use: No    Home Medications Prior to Admission medications   Medication Sig Start Date End Date Taking? Authorizing Provider  acetaminophen (TYLENOL) 325 MG tablet Take 2 tablets (650 mg total) by mouth every 6 (six) hours as needed for mild pain, fever or headache (pain score 1-3 or temp > 100.5). 03/22/19   [place] Donnamarie Poag, DO  docusate sodium (COLACE) 100 MG capsule Take 1 capsule (100 mg total) by mouth 2 (two) times daily. 03/22/19   [place] Eric J, DO  enoxaparin (LOVENOX) 40 MG/0.4ML injection Inject 0.4 mLs (40 mg total) into the skin daily. 03/17/19  Leandrew Koyanagi, MD  feeding supplement, ENSURE ENLIVE, (ENSURE ENLIVE) LIQD Take 237 mLs by mouth 2 (two) times daily between meals. 02/24/19   Dana Allan I, MD  hydrocortisone 2.5 % cream Apply 1 application topically 2 (two) times daily. Apply to rash on right arm    [provider]  LORazepam (ATIVAN) 0.5 MG tablet Take 1 tablet (0.5 mg total) by mouth every 12 (twelve) hours as needed (agitation). 03/22/19   [place] Donnamarie Poag, DO  methocarbamol (ROBAXIN) 500 MG tablet Take 1 tablet (500 mg total) by mouth every 6 (six) hours as needed for muscle spasms. 03/22/19   [place] Donnamarie Poag, DO  Multiple Vitamin (MULTIVITAMIN WITH MINERALS) TABS  tablet Take 1 tablet by mouth daily. 02/25/19   Bonnell Public, MD  neomycin-bacitracin-polymyxin (NEOSPORIN) [phone] ointment Apply 1 application topically every evening. Apply to left wrist for skin tear    [provider]  nystatin cream (MYCOSTATIN) Apply 1 application topically 2 (two) times daily. Apply to groin/penis for yeast    [provider]  oxyCODONE-acetaminophen (PERCOCET) 5-325 MG tablet Take 1-2 tablets by mouth every 8 (eight) hours as needed for severe pain. 03/17/19   Leandrew Koyanagi, MD  polyethylene glycol (MIRALAX / GLYCOLAX) 17 g packet Take 17 g by mouth daily as needed for mild constipation. 03/22/19   [place] Donnamarie Poag, DO  traZODone (DESYREL) 50 MG tablet Take 1 tablet (50 mg total) by mouth at bedtime. 03/22/19   [place] Donnamarie Poag, DO    Allergies    Codeine, Penicillins, Ranitidine, and Amoxicillin  Review of Systems   Review of Systems  Unable to perform ROS: Dementia  Skin: Positive for wound.    Physical Exam Updated Vital Signs BP (!) 172/95   Pulse 95   Temp 97.6 F (36.4 C) (Oral)   Resp 17   Ht 6\' 2"  (1.88 m)   Wt 64 kg   SpO2 (!) 87%   BMI 18.12 kg/m   Physical Exam Vitals and nursing note reviewed.  Constitutional:      General: He is not in acute distress.    Appearance: He is well-developed and well-nourished.     Comments: Demented. nontoxic  HENT:     Head: Normocephalic.      Comments: Small scalp lac, ~1 cm, with minimal gaping. No active bleeding Eyes:     Extraocular Movements: Extraocular movements intact and EOM normal.     Conjunctiva/sclera: Conjunctivae normal.     Pupils: Pupils are equal, round, and reactive to light.  Neck:     Comments: In c collar Cardiovascular:     Rate and Rhythm: Normal rate and regular rhythm.     Pulses: Normal pulses and intact distal pulses.  Pulmonary:     Effort: Pulmonary effort is normal. No respiratory distress.     Breath sounds: Normal breath sounds. No wheezing.   Abdominal:     General: There is no distension.     Palpations: Abdomen is soft. There is no mass.     Tenderness: There is no abdominal tenderness. There is no guarding or rebound.  Musculoskeletal:        General: Normal range of motion.     Comments: No obvious deformity.  Radial and pedal pulses 2+ bilaterally.  No TTP of midline back  Skin:    General: Skin is warm and dry.     Capillary Refill: Capillary refill takes less than 2 seconds.  Neurological:  Mental Status: He is alert.  Psychiatric:        Mood and Affect: Mood and affect normal.     ED Results / Procedures / Treatments   Labs (all labs ordered are listed, but only abnormal results are displayed) Labs Reviewed  URINALYSIS, ROUTINE W REFLEX MICROSCOPIC - Abnormal; Notable for the following components:      Result Value   Color, Urine STRAW (*)    All other components within normal limits  CBC WITH DIFFERENTIAL/PLATELET - Abnormal; Notable for the following components:   RBC 3.94 (*)    Hemoglobin 11.7 (*)    HCT 36.2 (*)    Eosinophils Absolute 0.8 (*)    All other components within normal limits  BASIC METABOLIC PANEL - Abnormal; Notable for the following components:   Glucose, Bld 104 (*)    BUN 31 (*)    Calcium 8.4 (*)    All other components within normal limits    EKG None  Radiology DG Chest 2 View  Result Date: 08/18/2020 CLINICAL DATA:  Unwitnessed fall. EXAM: CHEST - 2 VIEW COMPARISON:  03/16/2019 FINDINGS: Cardiomegaly with unchanged mediastinal contours. Aortic atherosclerosis. Artifact from cervical collar projects over the upper lung zones. Minor atelectasis at the right lung base. No confluent consolidation. No pneumothorax or pleural effusion. No acute osseous abnormalities are seen. IMPRESSION: 1. No acute traumatic injury to the thorax. 2. Cardiomegaly.  Aortic Atherosclerosis (ICD10-I70.0). Electronically Signed   By: Keith Rake M.D.   On: 08/18/2020 17:26   CT Head Wo  Contrast  Result Date: 08/18/2020 CLINICAL DATA:  Unwitnessed fall, posterior right head laceration EXAM: CT HEAD WITHOUT CONTRAST CT CERVICAL SPINE WITHOUT CONTRAST TECHNIQUE: Multidetector CT imaging of the head and cervical spine was performed following the standard protocol without intravenous contrast. Multiplanar CT image reconstructions of the cervical spine were also generated. COMPARISON:  07/24/2019 FINDINGS: CT HEAD FINDINGS Brain: No evidence of acute infarction, hemorrhage, hydrocephalus, extra-axial collection or mass lesion/mass effect. Periventricular and deep white matter hypodensity. Vascular: No hyperdense vessel or unexpected calcification. Skull: Normal. Negative for fracture or focal lesion. Sinuses/Orbits: No acute finding. Other: Soft tissue contusion over the right scalp vertex. CT CERVICAL SPINE FINDINGS Alignment: Normal. Skull base and vertebrae: No acute fracture. No primary bone lesion or focal pathologic process. Soft tissues and spinal canal: No prevertebral fluid or swelling. No visible canal hematoma. Disc levels: Mild multilevel disc space height loss and osteophytosis. Upper chest: Negative. Other: None. IMPRESSION: 1. No acute intracranial pathology. Small-vessel white matter disease in keeping with advanced patient age. 2. Soft tissue contusion over the right scalp vertex. 3. No fracture or static subluxation of the cervical spine. Electronically Signed   By: Eddie Candle M.D.   On: 08/18/2020 17:18   CT Cervical Spine Wo Contrast  Result Date: 08/18/2020 CLINICAL DATA:  Unwitnessed fall, posterior right head laceration EXAM: CT HEAD WITHOUT CONTRAST CT CERVICAL SPINE WITHOUT CONTRAST TECHNIQUE: Multidetector CT imaging of the head and cervical spine was performed following the standard protocol without intravenous contrast. Multiplanar CT image reconstructions of the cervical spine were also generated. COMPARISON:  07/24/2019 FINDINGS: CT HEAD FINDINGS Brain: No  evidence of acute infarction, hemorrhage, hydrocephalus, extra-axial collection or mass lesion/mass effect. Periventricular and deep white matter hypodensity. Vascular: No hyperdense vessel or unexpected calcification. Skull: Normal. Negative for fracture or focal lesion. Sinuses/Orbits: No acute finding. Other: Soft tissue contusion over the right scalp vertex. CT CERVICAL SPINE FINDINGS Alignment: Normal. Skull base and vertebrae:  No acute fracture. No primary bone lesion or focal pathologic process. Soft tissues and spinal canal: No prevertebral fluid or swelling. No visible canal hematoma. Disc levels: Mild multilevel disc space height loss and osteophytosis. Upper chest: Negative. Other: None. IMPRESSION: 1. No acute intracranial pathology. Small-vessel white matter disease in keeping with advanced patient age. 2. Soft tissue contusion over the right scalp vertex. 3. No fracture or static subluxation of the cervical spine. Electronically Signed   By: Eddie Candle M.D.   On: 08/18/2020 17:18   DG Hips Bilat W or Wo Pelvis 3-4 Views  Result Date: 08/18/2020 CLINICAL DATA:  Unwitnessed fall EXAM: DG HIP (WITH OR WITHOUT PELVIS) 3-4V BILAT COMPARISON:  Radiograph 06/23/2019 FINDINGS: There is a left hip hemiarthroplasty and transcervical right femoral pinning without evidence of acute hardware fracture or failure. No periprosthetic fracture or acute complication is seen. The native right femoral head and the articulating left femoral components remain in expected alignment. Some likely postsurgical heterotrophic ossification is seen about the left hip. Remaining bones of the pelvis are intact and congruent. Degenerative changes in the SI joints, bilateral hips and lower lumbar spine. Vascular calcium within the soft tissues. Mild edematous changes. IMPRESSION: 1. Left hip hemiarthroplasty and transcervical right femoral pinning without acute hardware complication or periprosthetic fracture. 2. No other acute  osseous abnormality. 3. Mild edematous changes of the soft tissues. Electronically Signed   By: Lovena Le M.D.   On: 08/18/2020 17:30    Procedures .Marland KitchenLaceration Repair  Date/Time: 08/18/2020 7:11 PM Performed by: Franchot Heidelberg, PA-C Authorized by: Franchot Heidelberg, PA-C   Consent:    Consent obtained:  Emergent situation Anesthesia:    Anesthesia method:  None Laceration details:    Location:  Scalp   Scalp location:  Occipital   Length (cm):  1   Depth (mm):  2 Pre-procedure details:    Preparation:  Patient was prepped and draped in usual sterile fashion and imaging obtained to evaluate for foreign bodies Exploration:    Hemostasis achieved with:  Direct pressure Treatment:    Area cleansed with:  Saline Skin repair:    Repair method:  Staples   Number of staples:  1 Approximation:    Approximation:  Close Repair type:    Repair type:  Simple Post-procedure details:    Dressing:  Open (no dressing)   Procedure completion:  Tolerated well, no immediate complications   (including critical care time)  Medications Ordered in ED Medications - No data to display  ED Course  I have reviewed the triage vital signs and the nursing notes.  Pertinent labs & imaging results that were available during my care of the patient were reviewed by me and considered in my medical decision making (see chart for details).    MDM Rules/Calculators/A&P                          Patient resenting for evaluation after an unwitnessed fall.  On exam, patient appears nontoxic.  He does have a small head laceration.  As patient has dementia and cannot tell us what happened, will obtain labs, imaging, EKG, urine to ensure no injury from the fall as well as no underlying metabolic cause for the fall.  CT head and neck negative for acute findings.  Chest and pelvis x-ray viewed interpreted by me, no fracture dislocation.  Labs interpreted by me, overall reassuring.  No leukocytosis.   Electrolytes stable.  EKG nonischemic.  Urine without signs of infection.  Scalp laceration repaired as described above.  At this time, patient appears safe for discharge.  Return precautions given.  Final Clinical Impression(s) / ED Diagnoses Final diagnoses:  Laceration of scalp, initial encounter  Fall, initial encounter    Rx / DC Orders ED Discharge Orders    None       Franchot Heidelberg, PA-C 08/18/20 1912    Pattricia Boss, MD 08/18/20 2235

## 2020-09-01 ENCOUNTER — Encounter (HOSPITAL_COMMUNITY): Payer: Self-pay

## 2020-09-01 ENCOUNTER — Emergency Department (HOSPITAL_COMMUNITY)
Admission: EM | Admit: 2020-09-01 | Discharge: 2020-09-01 | Disposition: A | Discharge status: Home / Self Care | Payer: Medicare Other | Attending: Emergency Medicine | Admitting: Emergency Medicine

## 2020-09-01 ENCOUNTER — Other Ambulatory Visit: Payer: Self-pay

## 2020-09-01 DIAGNOSIS — F0281 Dementia in other diseases classified elsewhere with behavioral disturbance: Secondary | ICD-10-CM | POA: Insufficient documentation

## 2020-09-01 DIAGNOSIS — F329 Major depressive disorder, single episode, unspecified: Secondary | ICD-10-CM | POA: Insufficient documentation

## 2020-09-01 DIAGNOSIS — Z79899 Other long term (current) drug therapy: Secondary | ICD-10-CM | POA: Diagnosis not present

## 2020-09-01 DIAGNOSIS — K0889 Other specified disorders of teeth and supporting structures: Secondary | ICD-10-CM

## 2020-09-01 DIAGNOSIS — Z86010 Personal history of colonic polyps: Secondary | ICD-10-CM | POA: Insufficient documentation

## 2020-09-01 MED ORDER — CLINDAMYCIN HCL 300 MG PO CAPS: 300.0000 mg | ORAL_CAPSULE | Freq: Four times a day (QID) | ORAL | 0 refills | Status: AC

## 2020-09-01 MED ORDER — CLINDAMYCIN HCL 300 MG PO CAPS
300.0000 mg | ORAL_CAPSULE | Freq: Once | ORAL | Status: AC
  Administered 2020-09-01: 300 mg via ORAL
  Filled 2020-09-01: qty 1

## 2020-09-01 NOTE — ED Notes (Signed)
Report given to Tawana a staff at the facility ([facility])

## 2020-09-01 NOTE — Discharge Instructions (Signed)
Take antibiotic as prescribed.  Suspect patient has dental abscess.  Follow-up with dentistry.  Return if symptoms worsen.  The patient needs to seek dental care.

## 2020-09-01 NOTE — ED Triage Notes (Signed)
Patient coming from [facility] with c/o left side of the possible abscess. Patient is at baseline per facility. He has dementia.

## 2020-09-01 NOTE — ED Provider Notes (Signed)
[facility] DEPT Provider Note   CSN: [id number] Arrival date & time: 09/01/20  0840     History No chief complaint on file.   Diamond Khadir Hagen is a 86 y.o. male.   Dental Pain Location:  Lower Quality:  Aching Severity:  Mild Onset quality:  Gradual Duration:  2 days Timing:  Constant Progression:  Unchanged Context: dental caries   Associated symptoms: gum swelling   Associated symptoms: no congestion, no difficulty swallowing, no drooling, no facial pain, no facial swelling, no fever, no headaches, no neck pain, no neck swelling, no oral bleeding, no oral lesions and no trismus        Past Medical History:  Diagnosis Date  . Allergic rhinitis, cause unspecified   . Anxiety state, unspecified   . Arthritis   . Benign neoplasm of colon   . Depressive disorder, not elsewhere classified   . Esophageal reflux   . Hyperlipidemia   . Irritable bowel syndrome   . Obstructive sleep apnea (adult) (pediatric)   . Other chronic allergic conjunctivitis   . Ulcer 1951   stomach    Patient Active Problem List   Diagnosis Date Noted  . Closed displaced fracture of right femoral neck ([place]) 02/19/2019  . Hip fracture ([place]) 02/19/2019  . Closed right hip fracture ([place]) 02/19/2019  . Evaluation by psychiatric service required   . Hypoxia 02/13/2018  . Edema 02/13/2018  . Hypoxemia 02/13/2018  . Dementia ([place]) 02/13/2018  . Altered mental status 02/13/2018  . Dermatitis 10/08/2015  . CHEST PAIN, ATYPICAL 04/11/2010  . Personal history of colon polyps and family history of colon cancer 12/19/2007  . Anxiety state 12/19/2007  . DEPRESSION 12/19/2007  . Bicipital tendinitis, left shoulder 12/19/2007  . ALLERGIC CONJUNCTIVITIS 12/19/2007  . Seasonal allergic rhinitis 12/19/2007  . GERD 12/19/2007  . IBS 12/19/2007    Past Surgical History:  Procedure Laterality Date  . ANTERIOR APPROACH HEMI HIP ARTHROPLASTY Left 03/17/2019    Procedure: ANTERIOR APPROACH HEMI HIP ARTHROPLASTY;  Surgeon: Leandrew Koyanagi, MD;  Location: [facility];  Service: Orthopedics;  Laterality: Left;  . APPENDECTOMY    . CATARACT EXTRACTION W/ INTRAOCULAR LENS  IMPLANT, BILATERAL  2003  . COLONOSCOPY    . ESOPHAGOGASTRODUODENOSCOPY    . HIP PINNING,CANNULATED Right 02/20/2019   Procedure: CANNULATED HIP PINNING;  Surgeon: Leandrew Koyanagi, MD;  Location: [facility];  Service: Orthopedics;  Laterality: Right;  . TONSILLECTOMY         Family History  Problem Relation Age of Onset  . Colon cancer Father   . Stroke Mother   . Heart attack Mother   . Colon cancer Mother   . Rectal cancer Neg Hx   . Stomach cancer Neg Hx     Social History   Tobacco Use  . Smoking status: Never Smoker  . Smokeless tobacco: Never Used  Substance Use Topics  . Alcohol use: No  . Drug use: No    Home Medications Prior to Admission medications   Medication Sig Start Date End Date Taking? Authorizing Provider  acetaminophen (TYLENOL) 325 MG tablet Take 2 tablets (650 mg total) by mouth every 6 (six) hours as needed for mild pain, fever or headache (pain score 1-3 or temp > 100.5). 03/22/19   [place] Donnamarie Poag, DO  clindamycin (CLEOCIN) 300 MG capsule Take 1 capsule (300 mg total) by mouth 4 (four) times daily for 10 days. 09/01/20 09/11/20  Lorana Maffeo, DO  docusate sodium (COLACE)  100 MG capsule Take 1 capsule (100 mg total) by mouth 2 (two) times daily. 03/22/19   [place] Eric J, DO  enoxaparin (LOVENOX) 40 MG/0.4ML injection Inject 0.4 mLs (40 mg total) into the skin daily. 03/17/19   Leandrew Koyanagi, MD  feeding supplement, ENSURE ENLIVE, (ENSURE ENLIVE) LIQD Take 237 mLs by mouth 2 (two) times daily between meals. 02/24/19   Dana Allan I, MD  hydrocortisone 2.5 % cream Apply 1 application topically 2 (two) times daily. Apply to rash on right arm    [provider]  LORazepam (ATIVAN) 0.5 MG tablet Take 1 tablet (0.5 mg total) by mouth every 12 (twelve)  hours as needed (agitation). 03/22/19   [place] Donnamarie Poag, DO  methocarbamol (ROBAXIN) 500 MG tablet Take 1 tablet (500 mg total) by mouth every 6 (six) hours as needed for muscle spasms. 03/22/19   [place] Donnamarie Poag, DO  Multiple Vitamin (MULTIVITAMIN WITH MINERALS) TABS tablet Take 1 tablet by mouth daily. 02/25/19   Bonnell Public, MD  neomycin-bacitracin-polymyxin (NEOSPORIN) [phone] ointment Apply 1 application topically every evening. Apply to left wrist for skin tear    [provider]  nystatin cream (MYCOSTATIN) Apply 1 application topically 2 (two) times daily. Apply to groin/penis for yeast    [provider]  oxyCODONE-acetaminophen (PERCOCET) 5-325 MG tablet Take 1-2 tablets by mouth every 8 (eight) hours as needed for severe pain. 03/17/19   Leandrew Koyanagi, MD  polyethylene glycol (MIRALAX / GLYCOLAX) 17 g packet Take 17 g by mouth daily as needed for mild constipation. 03/22/19   [place] Donnamarie Poag, DO  traZODone (DESYREL) 50 MG tablet Take 1 tablet (50 mg total) by mouth at bedtime. 03/22/19   [place] Donnamarie Poag, DO    Allergies    Codeine, Penicillins, Ranitidine, and Amoxicillin  Review of Systems   Review of Systems  Constitutional: Negative for fever.  HENT: Positive for dental problem. Negative for congestion, drooling, ear discharge, ear pain, facial swelling, hearing loss, mouth sores, nosebleeds, postnasal drip, rhinorrhea, sinus pressure, sinus pain, sneezing, sore throat, tinnitus, trouble swallowing and voice change.   Musculoskeletal: Negative for neck pain.  Neurological: Negative for headaches.    Physical Exam Updated Vital Signs BP (!) 143/86 (BP Location: Left Arm)   Pulse 68   Temp 98 F (36.7 C) (Oral)   Resp 14   Ht 6' (1.829 m)   Wt 68 kg   SpO2 95%   BMI 20.34 kg/m   Physical Exam Constitutional:      General: He is not in acute distress.    Appearance: He is not ill-appearing.  HENT:     Head: Normocephalic.     Comments: Left  lower dental caries with suspected likely dental abscess.  There is no swelling of the submandibular space some mild swelling to the gingival line underneath the left molar, the floor of the mouth is soft and there is no trismus, no drooling    Nose: Nose normal.     Mouth/Throat:     Mouth: Mucous membranes are moist.     Pharynx: No oropharyngeal exudate or posterior oropharyngeal erythema.  Eyes:     Pupils: Pupils are equal, round, and reactive to light.  Neurological:     Mental Status: He is alert.     ED Results / Procedures / Treatments   Labs (all labs ordered are listed, but only abnormal results are displayed) Labs Reviewed - No data to display  EKG None  Radiology No results found.  Procedures Procedures (including critical care time)  Medications Ordered in ED Medications  clindamycin (CLEOCIN) capsule 300 mg (has no administration in time range)    ED Course  I have reviewed the triage vital signs and the nursing notes.  Pertinent labs & imaging results that were available during my care of the patient were reviewed by me and considered in my medical decision making (see chart for details).    MDM Rules/Calculators/A&P                          Tupac Sameul Tagle is an 86 year old male with history of dementia.  Here with gingival swelling.  Likely has a mild tooth abscess.  There is no signs to suggest deeper space infection such as Ludwigds.  No trismus, no drooling, no submandibular swelling.  Overall patient appears stable.  Will start on clindamycin.  Refer to dentistry.  Understands return precautions.  Final Clinical Impression(s) / ED Diagnoses Final diagnoses:  Pain, dental    Rx / DC Orders ED Discharge Orders         Ordered    clindamycin (CLEOCIN) 300 MG capsule  4 times daily        09/01/20 0958           Lennice Sites, DO 09/01/20 1001

## 2022-02-04 ENCOUNTER — Emergency Department (HOSPITAL_COMMUNITY): Payer: No Typology Code available for payment source

## 2022-02-04 ENCOUNTER — Inpatient Hospital Stay (HOSPITAL_COMMUNITY)
Admission: EM | Admit: 2022-02-04 | Discharge: 2022-02-10 | DRG: 682 | Disposition: A | Discharge status: Hospice | Payer: No Typology Code available for payment source | Source: Skilled Nursing Facility | Attending: Internal Medicine | Admitting: Internal Medicine

## 2022-02-04 DIAGNOSIS — Y92129 Unspecified place in nursing home as the place of occurrence of the external cause: Secondary | ICD-10-CM

## 2022-02-04 DIAGNOSIS — R9431 Abnormal electrocardiogram [ECG] [EKG]: Secondary | ICD-10-CM | POA: Diagnosis present

## 2022-02-04 DIAGNOSIS — D696 Thrombocytopenia, unspecified: Secondary | ICD-10-CM | POA: Diagnosis present

## 2022-02-04 DIAGNOSIS — Z96642 Presence of left artificial hip joint: Secondary | ICD-10-CM | POA: Diagnosis present

## 2022-02-04 DIAGNOSIS — K589 Irritable bowel syndrome without diarrhea: Secondary | ICD-10-CM | POA: Diagnosis present

## 2022-02-04 DIAGNOSIS — Z681 Body mass index (BMI) 19 or less, adult: Secondary | ICD-10-CM

## 2022-02-04 DIAGNOSIS — E870 Hyperosmolality and hypernatremia: Secondary | ICD-10-CM | POA: Diagnosis present

## 2022-02-04 DIAGNOSIS — G253 Myoclonus: Secondary | ICD-10-CM | POA: Diagnosis present

## 2022-02-04 DIAGNOSIS — E782 Mixed hyperlipidemia: Secondary | ICD-10-CM | POA: Diagnosis present

## 2022-02-04 DIAGNOSIS — Z66 Do not resuscitate: Secondary | ICD-10-CM | POA: Diagnosis present

## 2022-02-04 DIAGNOSIS — Z515 Encounter for palliative care: Secondary | ICD-10-CM

## 2022-02-04 DIAGNOSIS — Z9842 Cataract extraction status, left eye: Secondary | ICD-10-CM

## 2022-02-04 DIAGNOSIS — Z7982 Long term (current) use of aspirin: Secondary | ICD-10-CM

## 2022-02-04 DIAGNOSIS — E43 Unspecified severe protein-calorie malnutrition: Secondary | ICD-10-CM | POA: Insufficient documentation

## 2022-02-04 DIAGNOSIS — Z888 Allergy status to other drugs, medicaments and biological substances status: Secondary | ICD-10-CM

## 2022-02-04 DIAGNOSIS — W19XXXA Unspecified fall, initial encounter: Secondary | ICD-10-CM | POA: Diagnosis present

## 2022-02-04 DIAGNOSIS — Z20822 Contact with and (suspected) exposure to covid-19: Secondary | ICD-10-CM | POA: Diagnosis present

## 2022-02-04 DIAGNOSIS — I10 Essential (primary) hypertension: Secondary | ICD-10-CM | POA: Diagnosis present

## 2022-02-04 DIAGNOSIS — G9341 Metabolic encephalopathy: Secondary | ICD-10-CM | POA: Diagnosis present

## 2022-02-04 DIAGNOSIS — N179 Acute kidney failure, unspecified: Principal | ICD-10-CM | POA: Diagnosis present

## 2022-02-04 DIAGNOSIS — Z885 Allergy status to narcotic agent status: Secondary | ICD-10-CM

## 2022-02-04 DIAGNOSIS — K219 Gastro-esophageal reflux disease without esophagitis: Secondary | ICD-10-CM | POA: Diagnosis present

## 2022-02-04 DIAGNOSIS — Z9841 Cataract extraction status, right eye: Secondary | ICD-10-CM

## 2022-02-04 DIAGNOSIS — R64 Cachexia: Secondary | ICD-10-CM | POA: Diagnosis present

## 2022-02-04 DIAGNOSIS — S0083XA Contusion of other part of head, initial encounter: Secondary | ICD-10-CM | POA: Diagnosis present

## 2022-02-04 DIAGNOSIS — I4891 Unspecified atrial fibrillation: Secondary | ICD-10-CM | POA: Diagnosis present

## 2022-02-04 DIAGNOSIS — G4733 Obstructive sleep apnea (adult) (pediatric): Secondary | ICD-10-CM | POA: Diagnosis present

## 2022-02-04 DIAGNOSIS — E860 Dehydration: Secondary | ICD-10-CM | POA: Diagnosis present

## 2022-02-04 DIAGNOSIS — R4182 Altered mental status, unspecified: Principal | ICD-10-CM

## 2022-02-04 DIAGNOSIS — Z79899 Other long term (current) drug therapy: Secondary | ICD-10-CM

## 2022-02-04 DIAGNOSIS — Z880 Allergy status to penicillin: Secondary | ICD-10-CM

## 2022-02-04 DIAGNOSIS — F0390 Unspecified dementia without behavioral disturbance: Secondary | ICD-10-CM | POA: Diagnosis present

## 2022-02-04 DIAGNOSIS — I493 Ventricular premature depolarization: Secondary | ICD-10-CM | POA: Diagnosis present

## 2022-02-04 DIAGNOSIS — Z961 Presence of intraocular lens: Secondary | ICD-10-CM | POA: Diagnosis present

## 2022-02-04 DIAGNOSIS — R946 Abnormal results of thyroid function studies: Secondary | ICD-10-CM | POA: Diagnosis present

## 2022-02-04 DIAGNOSIS — L03115 Cellulitis of right lower limb: Secondary | ICD-10-CM | POA: Diagnosis present

## 2022-02-04 DIAGNOSIS — E872 Acidosis, unspecified: Secondary | ICD-10-CM | POA: Diagnosis present

## 2022-02-04 DIAGNOSIS — R627 Adult failure to thrive: Secondary | ICD-10-CM | POA: Diagnosis present

## 2022-02-04 LAB — URINALYSIS, ROUTINE W REFLEX MICROSCOPIC
Bilirubin Urine: NEGATIVE
Glucose, UA: NEGATIVE mg/dL
Hgb urine dipstick: NEGATIVE
Ketones, ur: NEGATIVE mg/dL
Leukocytes,Ua: NEGATIVE
Nitrite: NEGATIVE
Protein, ur: NEGATIVE mg/dL
Specific Gravity, Urine: 1.015 (ref 1.005–1.030)
pH: 5 (ref 5.0–8.0)

## 2022-02-04 LAB — CBC WITH DIFFERENTIAL/PLATELET
Abs Immature Granulocytes: 0.1 10*3/uL — ABNORMAL HIGH (ref 0.00–0.07)
Band Neutrophils: 1 %
Basophils Absolute: 0 10*3/uL (ref 0.0–0.1)
Basophils Relative: 0 %
Eosinophils Absolute: 0.8 10*3/uL — ABNORMAL HIGH (ref 0.0–0.5)
Eosinophils Relative: 0 %
HCT: 32.8 % — ABNORMAL LOW (ref 39.0–52.0)
Hemoglobin: 10.8 g/dL — ABNORMAL LOW (ref 13.0–17.0)
Lymphocytes Relative: 1 %
Lymphs Abs: 5.8 10*3/uL — ABNORMAL HIGH (ref 0.7–4.0)
MCH: 30.6 pg (ref 26.0–34.0)
MCHC: 32.9 g/dL (ref 30.0–36.0)
MCV: 92.9 fL (ref 80.0–100.0)
Monocytes Absolute: 1.1 10*3/uL — ABNORMAL HIGH (ref 0.1–1.0)
Monocytes Relative: 2 %
Myelocytes: 0 %
Neutro Abs: 4.8 10*3/uL (ref 1.7–7.7)
Neutrophils Relative %: 96 %
Platelets: 121 10*3/uL — ABNORMAL LOW (ref 150–400)
RBC: 3.53 MIL/uL — ABNORMAL LOW (ref 4.22–5.81)
RDW: 13.8 % (ref 11.5–15.5)
WBC: 12.6 10*3/uL — ABNORMAL HIGH (ref 4.0–10.5)
nRBC: 0 % (ref 0.0–0.2)

## 2022-02-04 LAB — I-STAT VENOUS BLOOD GAS, ED
Acid-Base Excess: 5 mmol/L — ABNORMAL HIGH (ref 0.0–2.0)
Bicarbonate: 28.2 mmol/L — ABNORMAL HIGH (ref 20.0–28.0)
Calcium, Ion: 1.05 mmol/L — ABNORMAL LOW (ref 1.15–1.40)
HCT: 33 % — ABNORMAL LOW (ref 39.0–52.0)
Hemoglobin: 11.2 g/dL — ABNORMAL LOW (ref 13.0–17.0)
O2 Saturation: 98 %
Potassium: 3.5 mmol/L (ref 3.5–5.1)
Sodium: 140 mmol/L (ref 135–145)
TCO2: 29 mmol/L (ref 22–32)
pCO2, Ven: 34.3 mmHg — ABNORMAL LOW (ref 44–60)
pH, Ven: 7.523 — ABNORMAL HIGH (ref 7.25–7.43)
pO2, Ven: 88 mmHg — ABNORMAL HIGH (ref 32–45)

## 2022-02-04 LAB — COMPREHENSIVE METABOLIC PANEL
ALT: 23 U/L (ref 0–44)
AST: 29 U/L (ref 15–41)
Albumin: 3 g/dL — ABNORMAL LOW (ref 3.5–5.0)
Alkaline Phosphatase: 67 U/L (ref 38–126)
Anion gap: 8 (ref 5–15)
BUN: 65 mg/dL — ABNORMAL HIGH (ref 8–23)
CO2: 30 mmol/L (ref 22–32)
Calcium: 8.8 mg/dL — ABNORMAL LOW (ref 8.9–10.3)
Chloride: 104 mmol/L (ref 98–111)
Creatinine, Ser: 1.88 mg/dL — ABNORMAL HIGH (ref 0.61–1.24)
GFR, Estimated: 34 mL/min — ABNORMAL LOW (ref >60)
Glucose, Bld: 115 mg/dL — ABNORMAL HIGH (ref 70–99)
Potassium: 3.6 mmol/L (ref 3.5–5.1)
Sodium: 142 mmol/L (ref 135–145)
Total Bilirubin: 0.2 mg/dL — ABNORMAL LOW (ref 0.3–1.2)
Total Protein: 6.2 g/dL — ABNORMAL LOW (ref 6.5–8.1)

## 2022-02-04 LAB — LACTIC ACID, PLASMA
Lactic Acid, Venous: 2.1 mmol/L (ref 0.5–1.9)
Lactic Acid, Venous: 1 mmol/L (ref 0.5–1.9)

## 2022-02-04 LAB — AMMONIA: Ammonia: 14 umol/L (ref 9–35)

## 2022-02-04 LAB — MAGNESIUM: Magnesium: 2 mg/dL (ref 1.7–2.4)

## 2022-02-04 MED ORDER — LACTATED RINGERS IV BOLUS
1000.0000 mL | Freq: Once | INTRAVENOUS | Status: AC
  Administered 2022-02-04: 1000 mL via INTRAVENOUS

## 2022-02-04 MED ORDER — LORAZEPAM 2 MG/ML IJ SOLN
0.5000 mg | Freq: Once | INTRAMUSCULAR | Status: AC
  Administered 2022-02-04: 0.5 mg via INTRAVENOUS
  Filled 2022-02-04: qty 1

## 2022-02-04 NOTE — ED Notes (Signed)
[facility] called and would like to know status of patient. Phone is [phone]

## 2022-02-04 NOTE — ED Provider Notes (Signed)
[facility] EMERGENCY DEPARTMENT Provider Note   CSN: [id number] Arrival date & time: 02/04/22  2044     History {Add pertinent medical, surgical, social history, OB history to HPI:1} Chief Complaint  Patient presents with  . Altered Mental Status    Lamar Domingos Riggi is a 88 y.o. male.  Patient is an 88 year old male with a history of dementia, obstructive sleep apnea, IBS who lives in a skilled facility who is being brought in today by paramedics for altered mental status.  Paramedics and nursing facility give history as patient is not able to provide any history.  The nursing facility reports the patient does have dementia but normally is able to ambulate around the facility with minimal assistance and is easily directable and will answer some questions.  They noticed from about 5:00 on today he has been not himself and not as responsive as normal.  They also reported over the last 2 days he has seemed more agitated but did not specify exactly what that meant.  They did report he had a fall about 1 week ago but is not on any anticoagulation.  They did not mention that he had had any nausea or vomiting but EMS noticed some minimal emesis on his clothing when they arrived.  They report he has had normal vital signs and normal blood sugar.  There is no report of recent medication changes but unclear exactly what patient has received today as it is not documented on the [facility]  The history is provided by the EMS personnel, medical records and the nursing home. The history is limited by the condition of the patient.  Altered Mental Status, obstructive sleep apnea, IBS,     Home Medications Prior to Admission medications   Medication Sig Start Date End Date Taking? Authorizing Provider  acetaminophen (TYLENOL) 325 MG tablet Take 2 tablets (650 mg total) by mouth every 6 (six) hours as needed for mild pain, fever or headache (pain score 1-3 or temp > 100.5). 03/22/19   [place]  Donnamarie Poag, DO  docusate sodium (COLACE) 100 MG capsule Take 1 capsule (100 mg total) by mouth 2 (two) times daily. 03/22/19   [place] Eric J, DO  enoxaparin (LOVENOX) 40 MG/0.4ML injection Inject 0.4 mLs (40 mg total) into the skin daily. 03/17/19   Leandrew Koyanagi, MD  feeding supplement, ENSURE ENLIVE, (ENSURE ENLIVE) LIQD Take 237 mLs by mouth 2 (two) times daily between meals. 02/24/19   Dana Allan I, MD  hydrocortisone 2.5 % cream Apply 1 application topically 2 (two) times daily. Apply to rash on right arm    [provider]  LORazepam (ATIVAN) 0.5 MG tablet Take 1 tablet (0.5 mg total) by mouth every 12 (twelve) hours as needed (agitation). 03/22/19   [place] Donnamarie Poag, DO  methocarbamol (ROBAXIN) 500 MG tablet Take 1 tablet (500 mg total) by mouth every 6 (six) hours as needed for muscle spasms. 03/22/19   [place] Donnamarie Poag, DO  Multiple Vitamin (MULTIVITAMIN WITH MINERALS) TABS tablet Take 1 tablet by mouth daily. 02/25/19   Bonnell Public, MD  neomycin-bacitracin-polymyxin (NEOSPORIN) [phone] ointment Apply 1 application topically every evening. Apply to left wrist for skin tear    [provider]  nystatin cream (MYCOSTATIN) Apply 1 application topically 2 (two) times daily. Apply to groin/penis for yeast    [provider]  oxyCODONE-acetaminophen (PERCOCET) 5-325 MG tablet Take 1-2 tablets by mouth every 8 (eight) hours as needed for severe pain. 03/17/19  Leandrew Koyanagi, MD  polyethylene glycol (MIRALAX / GLYCOLAX) 17 g packet Take 17 g by mouth daily as needed for mild constipation. 03/22/19   [place] Donnamarie Poag, DO  traZODone (DESYREL) 50 MG tablet Take 1 tablet (50 mg total) by mouth at bedtime. 03/22/19   [place] Donnamarie Poag, DO      Allergies    Codeine, Penicillins, Ranitidine, and Amoxicillin    Review of Systems   Review of Systems  Physical Exam Updated Vital Signs BP (!) 117/99   Pulse 95   Temp 98.4 F (36.9 C) (Rectal)   Resp 20   SpO2 99%   Physical Exam Vitals and nursing note reviewed.  Constitutional:      General: He is not in acute distress.    Appearance: He is well-developed. He is ill-appearing.  HENT:     Head: Normocephalic.      Mouth/Throat:     Mouth: Mucous membranes are dry.  Eyes:     Conjunctiva/sclera: Conjunctivae normal.     Comments: 57m and sluggishly reactive bilaterally  Cardiovascular:     Rate and Rhythm: Normal rate and regular rhythm.     Heart sounds: No murmur heard. Pulmonary:     Effort: Pulmonary effort is normal. No respiratory distress.     Breath sounds: Normal breath sounds. No wheezing or rales.  Abdominal:     General: There is no distension.     Palpations: Abdomen is soft.     Tenderness: There is no abdominal tenderness. There is no guarding or rebound.     Comments: Grimaces with palpation throughout the abdomen  Musculoskeletal:        General: No tenderness. Normal range of motion.     Cervical back: Normal range of motion and neck supple.     Right lower leg: Edema present.     Left lower leg: Edema present.  Skin:    General: Skin is warm and dry.     Findings: No erythema or rash.  Neurological:     Mental Status: He is alert.     Comments: Upper extremity twitching.  We will not direct to voice or answer any questions.  Patient is awake and will localize to pain.  He is noted to move all extremities  Psychiatric:     Comments: calm    ED Results / Procedures / Treatments   Labs (all labs ordered are listed, but only abnormal results are displayed) Labs Reviewed  CBC WITH DIFFERENTIAL/PLATELET - Abnormal; Notable for the following components:      Result Value   WBC 12.6 (*)    RBC 3.53 (*)    Hemoglobin 10.8 (*)    HCT 32.8 (*)    Platelets 121 (*)    Lymphs Abs 5.8 (*)    Monocytes Absolute 1.1 (*)    Eosinophils Absolute 0.8 (*)    Abs Immature Granulocytes 0.10 (*)    All other components within normal limits  COMPREHENSIVE METABOLIC PANEL -  Abnormal; Notable for the following components:   Glucose, Bld 115 (*)    BUN 65 (*)    Creatinine, Ser 1.88 (*)    Calcium 8.8 (*)    Total Protein 6.2 (*)    Albumin 3.0 (*)    Total Bilirubin 0.2 (*)    GFR, Estimated 34 (*)    All other components within normal limits  LACTIC ACID, PLASMA - Abnormal; Notable for the following components:   Lactic Acid, Venous 2.1 (*)  All other components within normal limits  URINALYSIS, ROUTINE W REFLEX MICROSCOPIC - Abnormal; Notable for the following components:   Bacteria, UA RARE (*)    All other components within normal limits  I-STAT VENOUS BLOOD GAS, ED - Abnormal; Notable for the following components:   pH, Ven 7.523 (*)    pCO2, Ven 34.3 (*)    pO2, Ven 88 (*)    Bicarbonate 28.2 (*)    Acid-Base Excess 5.0 (*)    Calcium, Ion 1.05 (*)    HCT 33.0 (*)    Hemoglobin 11.2 (*)    All other components within normal limits  MAGNESIUM  AMMONIA  LACTIC ACID, PLASMA    EKG EKG Interpretation  Date/Time:  Wednesday Feb 04 2022 22:52:01 EDT Ventricular Rate:  91 PR Interval:    QRS Duration: 111 QT Interval:  427 QTC Calculation: 526 R Axis:   11 Text Interpretation: new Atrial fibrillation Anteroseptal infarct, age indeterminate Prolonged QT interval Baseline wander in lead(s) V2 Confirmed by Blanchie Dessert ([id number]) on 02/04/2022 11:23:11 PM  Radiology DG Chest Port 1 View  Result Date: 02/04/2022 CLINICAL DATA:  Altered mental status. EXAM: PORTABLE CHEST 1 VIEW COMPARISON:  Chest x-ray 03/16/2019 FINDINGS: The heart size and mediastinal contours are within normal limits. Again seen are atherosclerotic calcifications of the aorta. Both lungs are clear. The visualized skeletal structures are unremarkable. IMPRESSION: No active disease. Electronically Signed   By: Ronney Asters M.D.   On: 02/04/2022 21:11    Procedures Procedures  {Document cardiac monitor, telemetry assessment procedure when appropriate:1}  Medications  Ordered in ED Medications  lactated ringers bolus 1,000 mL (1,000 mLs Intravenous New Bag/Given 02/04/22 2243)  LORazepam (ATIVAN) injection 0.5 mg (0.5 mg Intravenous Given 02/04/22 2315)    ED Course/ Medical Decision Making/ A&P                           Medical Decision Making Amount and/or Complexity of Data Reviewed Labs: ordered. Radiology: ordered.  Risk Prescription drug management.   Pt with multiple medical problems and comorbidities and presenting today with a complaint that caries a high risk for morbidity and mortality.  Presenting today with EMS for altered mental status.  Patient is unable to provide any history but nursing facility reports agitation over the last 2 days and then since 5 PM he is been less responsive.  Patient is awake here but altered and seems encephalopathic.  He does have healing ecchymosis over the right eye and there was a report of a fall a week ago.  Patient has no focal deficits concerning for stroke at this time however given the report of his behavior and his exam concern for delayed head bleed, electrolyte abnormality, AKI with uremia, sepsis, hypokalemia versus medication reaction as he is on Ativan and trazodone and unclear how often he is getting these medications.  Patient given IV fluids as he does appear dry.  Labs and EKG are pending.   {Document critical care time when appropriate:1} {Document review of labs and clinical decision tools ie heart score, Chads2Vasc2 etc:1}  {Document your independent review of radiology images, and any outside records:1} {Document your discussion with family members, caretakers, and with consultants:1} {Document social determinants of health affecting pt's care:1} {Document your decision making why or why not admission, treatments were needed:1} Final Clinical Impression(s) / ED Diagnoses Final diagnoses:  None    Rx / DC Orders ED Discharge Orders  None       

## 2022-02-04 NOTE — ED Triage Notes (Signed)
Pt from [facility], dementia pt. Staff reports AMS, usually "knows his name/ responds to name, ambulatory," LSN [id number], staff reported agitation x2-3 days. EMS reports not tracking staff.   18RAC, approx 279m fluid 104/60 manual 80-104 HR

## 2022-02-05 ENCOUNTER — Inpatient Hospital Stay (HOSPITAL_COMMUNITY)
Admit: 2022-02-05 | Discharge: 2022-02-05 | Disposition: A | Discharge status: Home / Self Care | Payer: No Typology Code available for payment source | Attending: Internal Medicine | Admitting: Internal Medicine

## 2022-02-05 ENCOUNTER — Inpatient Hospital Stay (HOSPITAL_COMMUNITY): Payer: No Typology Code available for payment source

## 2022-02-05 DIAGNOSIS — E43 Unspecified severe protein-calorie malnutrition: Secondary | ICD-10-CM | POA: Diagnosis present

## 2022-02-05 DIAGNOSIS — M79604 Pain in right leg: Secondary | ICD-10-CM | POA: Diagnosis not present

## 2022-02-05 DIAGNOSIS — R946 Abnormal results of thyroid function studies: Secondary | ICD-10-CM | POA: Diagnosis present

## 2022-02-05 DIAGNOSIS — I4891 Unspecified atrial fibrillation: Secondary | ICD-10-CM | POA: Diagnosis present

## 2022-02-05 DIAGNOSIS — I10 Essential (primary) hypertension: Secondary | ICD-10-CM | POA: Diagnosis present

## 2022-02-05 DIAGNOSIS — Z681 Body mass index (BMI) 19 or less, adult: Secondary | ICD-10-CM | POA: Diagnosis not present

## 2022-02-05 DIAGNOSIS — E872 Acidosis, unspecified: Secondary | ICD-10-CM | POA: Diagnosis present

## 2022-02-05 DIAGNOSIS — G253 Myoclonus: Secondary | ICD-10-CM | POA: Diagnosis present

## 2022-02-05 DIAGNOSIS — K219 Gastro-esophageal reflux disease without esophagitis: Secondary | ICD-10-CM

## 2022-02-05 DIAGNOSIS — E870 Hyperosmolality and hypernatremia: Secondary | ICD-10-CM | POA: Diagnosis present

## 2022-02-05 DIAGNOSIS — Z515 Encounter for palliative care: Secondary | ICD-10-CM | POA: Diagnosis not present

## 2022-02-05 DIAGNOSIS — F0390 Unspecified dementia without behavioral disturbance: Secondary | ICD-10-CM | POA: Diagnosis present

## 2022-02-05 DIAGNOSIS — Z885 Allergy status to narcotic agent status: Secondary | ICD-10-CM | POA: Diagnosis not present

## 2022-02-05 DIAGNOSIS — Z79899 Other long term (current) drug therapy: Secondary | ICD-10-CM | POA: Diagnosis not present

## 2022-02-05 DIAGNOSIS — L03115 Cellulitis of right lower limb: Secondary | ICD-10-CM | POA: Diagnosis present

## 2022-02-05 DIAGNOSIS — N179 Acute kidney failure, unspecified: Secondary | ICD-10-CM | POA: Diagnosis present

## 2022-02-05 DIAGNOSIS — Y92129 Unspecified place in nursing home as the place of occurrence of the external cause: Secondary | ICD-10-CM | POA: Diagnosis not present

## 2022-02-05 DIAGNOSIS — E860 Dehydration: Secondary | ICD-10-CM | POA: Diagnosis present

## 2022-02-05 DIAGNOSIS — G9341 Metabolic encephalopathy: Secondary | ICD-10-CM | POA: Diagnosis present

## 2022-02-05 DIAGNOSIS — E782 Mixed hyperlipidemia: Secondary | ICD-10-CM | POA: Diagnosis present

## 2022-02-05 DIAGNOSIS — W19XXXA Unspecified fall, initial encounter: Secondary | ICD-10-CM | POA: Diagnosis present

## 2022-02-05 DIAGNOSIS — R64 Cachexia: Secondary | ICD-10-CM | POA: Diagnosis present

## 2022-02-05 DIAGNOSIS — Z20822 Contact with and (suspected) exposure to covid-19: Secondary | ICD-10-CM | POA: Diagnosis present

## 2022-02-05 DIAGNOSIS — D696 Thrombocytopenia, unspecified: Secondary | ICD-10-CM | POA: Diagnosis present

## 2022-02-05 DIAGNOSIS — G4733 Obstructive sleep apnea (adult) (pediatric): Secondary | ICD-10-CM | POA: Diagnosis present

## 2022-02-05 DIAGNOSIS — K589 Irritable bowel syndrome without diarrhea: Secondary | ICD-10-CM | POA: Diagnosis present

## 2022-02-05 DIAGNOSIS — Z66 Do not resuscitate: Secondary | ICD-10-CM | POA: Diagnosis present

## 2022-02-05 LAB — COMPREHENSIVE METABOLIC PANEL
ALT: 19 U/L (ref 0–44)
AST: 25 U/L (ref 15–41)
Albumin: 2.7 g/dL — ABNORMAL LOW (ref 3.5–5.0)
Alkaline Phosphatase: 63 U/L (ref 38–126)
Anion gap: 6 (ref 5–15)
BUN: 54 mg/dL — ABNORMAL HIGH (ref 8–23)
CO2: 28 mmol/L (ref 22–32)
Calcium: 8.7 mg/dL — ABNORMAL LOW (ref 8.9–10.3)
Chloride: 109 mmol/L (ref 98–111)
Creatinine, Ser: 1.51 mg/dL — ABNORMAL HIGH (ref 0.61–1.24)
GFR, Estimated: 44 mL/min — ABNORMAL LOW (ref >60)
Glucose, Bld: 89 mg/dL (ref 70–99)
Potassium: 3.8 mmol/L (ref 3.5–5.1)
Sodium: 143 mmol/L (ref 135–145)
Total Bilirubin: 0.3 mg/dL (ref 0.3–1.2)
Total Protein: 5.6 g/dL — ABNORMAL LOW (ref 6.5–8.1)

## 2022-02-05 LAB — CBC WITH DIFFERENTIAL/PLATELET
Abs Immature Granulocytes: 0 10*3/uL (ref 0.00–0.07)
Basophils Absolute: 0 10*3/uL (ref 0.0–0.1)
Basophils Relative: 0 %
Eosinophils Absolute: 0 10*3/uL (ref 0.0–0.5)
Eosinophils Relative: 0 %
HCT: 33 % — ABNORMAL LOW (ref 39.0–52.0)
Hemoglobin: 10.6 g/dL — ABNORMAL LOW (ref 13.0–17.0)
Lymphocytes Relative: 4 %
Lymphs Abs: 0.5 10*3/uL — ABNORMAL LOW (ref 0.7–4.0)
MCH: 29.8 pg (ref 26.0–34.0)
MCHC: 32.1 g/dL (ref 30.0–36.0)
MCV: 92.7 fL (ref 80.0–100.0)
Monocytes Absolute: 0.3 10*3/uL (ref 0.1–1.0)
Monocytes Relative: 2 %
Neutro Abs: 11.8 10*3/uL — ABNORMAL HIGH (ref 1.7–7.7)
Neutrophils Relative %: 94 %
Platelets: 136 10*3/uL — ABNORMAL LOW (ref 150–400)
RBC: 3.56 MIL/uL — ABNORMAL LOW (ref 4.22–5.81)
RDW: 14.2 % (ref 11.5–15.5)
WBC: 12.6 10*3/uL — ABNORMAL HIGH (ref 4.0–10.5)
nRBC: 0 % (ref 0.0–0.2)
nRBC: 0 /100 WBC

## 2022-02-05 LAB — I-STAT VENOUS BLOOD GAS, ED
Acid-Base Excess: 6 mmol/L — ABNORMAL HIGH (ref 0.0–2.0)
Bicarbonate: 30 mmol/L — ABNORMAL HIGH (ref 20.0–28.0)
Calcium, Ion: 1.13 mmol/L — ABNORMAL LOW (ref 1.15–1.40)
HCT: 32 % — ABNORMAL LOW (ref 39.0–52.0)
Hemoglobin: 10.9 g/dL — ABNORMAL LOW (ref 13.0–17.0)
O2 Saturation: 98 %
Potassium: 3.6 mmol/L (ref 3.5–5.1)
Sodium: 140 mmol/L (ref 135–145)
TCO2: 31 mmol/L (ref 22–32)
pCO2, Ven: 41 mmHg — ABNORMAL LOW (ref 44–60)
pH, Ven: 7.473 — ABNORMAL HIGH (ref 7.25–7.43)
pO2, Ven: 107 mmHg — ABNORMAL HIGH (ref 32–45)

## 2022-02-05 LAB — FOLATE: Folate: 6.8 ng/mL (ref >5.9)

## 2022-02-05 LAB — SARS CORONAVIRUS 2 BY RT PCR: SARS Coronavirus 2 by RT PCR: NEGATIVE

## 2022-02-05 LAB — C-REACTIVE PROTEIN: CRP: 8.6 mg/dL — ABNORMAL HIGH (ref <1.0)

## 2022-02-05 LAB — MAGNESIUM: Magnesium: 2 mg/dL (ref 1.7–2.4)

## 2022-02-05 LAB — TSH: TSH: 5.015 u[IU]/mL — ABNORMAL HIGH (ref 0.350–4.500)

## 2022-02-05 LAB — VITAMIN B12: Vitamin B-12: 729 pg/mL (ref 180–914)

## 2022-02-05 LAB — PROCALCITONIN: Procalcitonin: 2.78 ng/mL

## 2022-02-05 MED ORDER — ACETAMINOPHEN 650 MG RE SUPP: 650.0000 mg | Freq: Four times a day (QID) | RECTAL | Status: DC | PRN

## 2022-02-05 MED ORDER — ONDANSETRON HCL 4 MG/2ML IJ SOLN: 4.0000 mg | Freq: Four times a day (QID) | INTRAMUSCULAR | Status: DC | PRN

## 2022-02-05 MED ORDER — ADULT MULTIVITAMIN W/MINERALS CH
1.0000 | ORAL_TABLET | Freq: Every day | ORAL | Status: DC
  Administered 2022-02-08 – 2022-02-10 (×3): 1 via ORAL
  Filled 2022-02-05 (×4): qty 1

## 2022-02-05 MED ORDER — ASPIRIN 81 MG PO CHEW: 81.0000 mg | CHEWABLE_TABLET | Freq: Every day | ORAL | Status: DC

## 2022-02-05 MED ORDER — TRAZODONE HCL 50 MG PO TABS: 50.0000 mg | ORAL_TABLET | Freq: Every day | ORAL | Status: DC

## 2022-02-05 MED ORDER — PRAVASTATIN SODIUM 10 MG PO TABS: 10.0000 mg | ORAL_TABLET | Freq: Every day | ORAL | Status: DC

## 2022-02-05 MED ORDER — LACTATED RINGERS IV SOLN
INTRAVENOUS | Status: AC
Start: 2022-02-05 — End: 2022-02-06

## 2022-02-05 MED ORDER — ENOXAPARIN SODIUM 30 MG/0.3ML IJ SOSY
30.0000 mg | PREFILLED_SYRINGE | Freq: Every day | INTRAMUSCULAR | Status: DC
  Administered 2022-02-05 – 2022-02-07 (×3): 30 mg via SUBCUTANEOUS
  Filled 2022-02-05 (×3): qty 0.3

## 2022-02-05 MED ORDER — LORAZEPAM 2 MG/ML IJ SOLN
1.0000 mg | INTRAMUSCULAR | Status: DC | PRN
Start: 2022-02-05 — End: 2022-02-11
  Administered 2022-02-08 (×2): 1 mg via INTRAVENOUS
  Filled 2022-02-05 (×2): qty 1

## 2022-02-05 MED ORDER — LACTATED RINGERS IV SOLN: INTRAVENOUS | Status: DC

## 2022-02-05 MED ORDER — POLYETHYLENE GLYCOL 3350 17 G PO PACK: 17.0000 g | PACK | Freq: Every day | ORAL | Status: DC | PRN

## 2022-02-05 MED ORDER — SODIUM CHLORIDE 0.9 % IV SOLN
2.0000 g | INTRAVENOUS | Status: DC
  Administered 2022-02-05 – 2022-02-10 (×6): 2 g via INTRAVENOUS
  Filled 2022-02-05 (×6): qty 20

## 2022-02-05 MED ORDER — MIRTAZAPINE 7.5 MG PO TABS
7.5000 mg | ORAL_TABLET | Freq: Every day | ORAL | Status: DC
Start: 2022-02-05 — End: 2022-02-05
  Filled 2022-02-05: qty 1

## 2022-02-05 MED ORDER — PANTOPRAZOLE SODIUM 40 MG PO TBEC: 40.0000 mg | DELAYED_RELEASE_TABLET | Freq: Every day | ORAL | Status: DC

## 2022-02-05 MED ORDER — ACETAMINOPHEN 325 MG PO TABS: 650.0000 mg | ORAL_TABLET | Freq: Four times a day (QID) | ORAL | Status: DC | PRN

## 2022-02-05 MED ORDER — ONDANSETRON HCL 4 MG PO TABS: 4.0000 mg | ORAL_TABLET | Freq: Four times a day (QID) | ORAL | Status: DC | PRN

## 2022-02-05 NOTE — ED Notes (Signed)
Per lab mg, procalcitonin, CMP all hemolyzed. They will reorder.

## 2022-02-05 NOTE — Assessment & Plan Note (Addendum)
   Etiology unclear, possibly due to volume depletion  Hydrating patient with intravenous isotonic fluids  Urinalysis reveals no evidence of urinary tract infection  Strict input and output monitoring  Avoiding nephrotoxic agents if at all possible  Monitor renal function and electrolytes with serial chemistries

## 2022-02-05 NOTE — Progress Notes (Addendum)
Patient seen this morning by Dr. Sheryle Spray with plan of care  Please see his HPI for full details  88 year old [name] [name] resident poor historian with known dementia, htn, gerd prior left proximal femur fracture status postrepair 03/2019, OSA not on CPAP   --not acting at his baseline started to have twitching and had a fall several days prior  Found to have AKI  twitching which was myoclonic  Baseline BUN/creatinine 13/0.6 on admission 65/1.88 with mild lactic acidosis 2.1 that was improved with IV fluid repletion 100 cc/H after 1 L bolus  I had a long discussion with Kayren Eaves at the bedside [phone] and her husband have been taking care of him for a long time  At baseline he is able to mobilize although unsafely-supposed to be using a walker but broke both hips while using it He is unable to give me any history-White count is elevated 12 range Blood cultures are pending he has a mild thrombocytopenia in addition which is new and he may have an infection in his right leg which seems to be cellulitic  Knee x-ray negative for #, duplex ultrasound negative for clot  He is DNR and we will monitor him for improvements in mentation and with his white count He cannot eat at this time as he is not coherent enough so we have stopped all of the oral meds He has seizure activity he is tremulous but not having seizures  We may have to have goals of care conversation if his mentation does not improve with treatment and reversal of his azotemia-   No charge  Verneita Griffes, MD [facility] 12:15 PM

## 2022-02-05 NOTE — ED Notes (Signed)
Assumed care of pt, clothing removed, brief changed, abrasion noted to L knee, abrasion and edema noted to R knee, Redness noted to R calf, redness and edema noted to R foot. Pt groans when RLE manipulated.  Bruising noted right forehead.  Pulses strong, moved all extremities. Fall bracelet placed on pt, call bell within reach, side rails up for safety

## 2022-02-05 NOTE — Assessment & Plan Note (Signed)
   Notable redness induration and warmth of the right lower extremity on exam  This may possibly be contributing to the patient's encephalopathy  Due to concurrent edema of the extremity we will additionally obtain an ultrasound to rule out DVT although this is unlikely  Treating with intravenous ceftriaxone  Intravenous fluids as noted above  Obtaining blood cultures

## 2022-02-05 NOTE — ED Notes (Signed)
Pt placed on inpt bed for comfort while boarding in ED

## 2022-02-05 NOTE — Assessment & Plan Note (Addendum)
   Patient presenting from assisted living facility with 2-day history of progressively worsening confusion and bouts of agitation  Presentation complicated by underlying dementia  On evaluation patient is exhibiting ongoing myoclonic jerking in the setting of acute kidney injury  Patient also has redness and induration of the RLE suggestive of a cellulitis.    Hydrating patient gently with intravenous isotonic fluid  Treating for suspected RLE cellulitis with intravenous ceftriaxone  Obtaining noncontrast CT imaging of the head, urinalysis, COVID-19 PCR testing, ammonia, vitamin B12, folate, TSH, inflammatory markers, VBG  Monitoring for symptomatic improvement

## 2022-02-05 NOTE — Assessment & Plan Note (Signed)
   Presence of advanced dementia complicates evaluation of this patient presenting with acute metabolic encephalopathy  Minimizing uncomfortable stimuli  Minimizing mood altering and sedating agents  Frequent redirection by staff  Fall precautions

## 2022-02-05 NOTE — Assessment & Plan Note (Signed)
   ER provider has discussed witnessed jerking with Dr. Curly Shores with neurology  believes patient may be suffering from uremia as the cause.   Dr. Curly Shores recommends intravenous volume resuscitation, resolving the acute kidney injury and uremia and reassessing.   She further recommends that if patient continues to exhibit jerking once acute kidney injury/uremia has resolved and to consider an EEG and obtaining formal neurology consultation.

## 2022-02-05 NOTE — Assessment & Plan Note (Addendum)
   Lactic acidosis likely secondary to volume depletion and suspected concurrent infection  Hydrate patient intravenous isotonic fluids, treating with intravenous antibiotics  Monitoring serial lactic acid levels to ensure downtrending and resolution.

## 2022-02-05 NOTE — ED Notes (Signed)
ED TO INPATIENT HANDOFF REPORT  ED Nurse Name and Phone #: Rehan Holness [phone]  S Name/Age/Gender [name] [name] 88 y.o. male Room/Bed: 003C/003C  Code Status   Code Status: DNR  Home/SNF/Other Skilled nursing facility none Is this baseline? No   Triage Complete: Triage complete  Chief Complaint Acute metabolic encephalopathy [M76.72]  Triage Note Pt from [facility], dementia pt. Staff reports AMS, usually "knows his name/ responds to name, ambulatory," LSN 1700, staff reported agitation x2-3 days. EMS reports not tracking staff.   18RAC, approx 247m fluid 104/60 manual 80-104 HR    Allergies Allergies  Allergen Reactions   Codeine Other (See Comments)    Unknown rxn per MAR   Penicillins Rash    Per MAR Did it involve swelling of the face/tongue/throat, SOB, or low BP? Unknown Did it involve sudden or severe rash/hives, skin peeling, or any reaction on the inside of your mouth or nose? Unknown Did you need to seek medical attention at a hospital or doctor's office? Unknown When did it last happen? Unknown       If all above answers are "NO", may proceed with cephalosporin use.   Ranitidine Other (See Comments)    Unknown rxn per MAR   Amoxicillin Rash    Per MAR Did it involve swelling of the face/tongue/throat, SOB, or low BP? Unknown Did it involve sudden or severe rash/hives, skin peeling, or any reaction on the inside of your mouth or nose? Unknown Did you need to seek medical attention at a hospital or doctor's office? Unknown When did it last happen? Unknown       If all above answers are "NO", may proceed with cephalosporin use.    Level of Care/Admitting Diagnosis ED Disposition     ED Disposition  Admit   Condition  --   COsage MSarita[[id number]]  Level of Care: Telemetry Medical [104]  May admit patient to [facility] equivalent level of care is available:: No  Covid Evaluation:  Asymptomatic - no recent exposure (last 10 days) testing not required  Diagnosis: Acute metabolic encephalopathy [[[id number]] Admitting Physician: SVernelle Emerald[[[id number]] Attending Physician: SVernelle Emerald[[[id number]] Estimated length of stay: 3 - 4 days  Certification:: I certify this patient will need inpatient services for at least 2 midnights          B Medical/Surgery History Past Medical History:  Diagnosis Date   Allergic rhinitis, cause unspecified    Anxiety state, unspecified    Arthritis    Benign neoplasm of colon    Depressive disorder, not elsewhere classified    Esophageal reflux    Hyperlipidemia    Irritable bowel syndrome    Obstructive sleep apnea (adult) (pediatric)    Other chronic allergic conjunctivitis    Ulcer 1951   stomach   Past Surgical History:  Procedure Laterality Date   ANTERIOR APPROACH HEMI HIP ARTHROPLASTY Left 03/17/2019   Procedure: ANTERIOR APPROACH HEMI HIP ARTHROPLASTY;  Surgeon: XLeandrew Koyanagi MD;  [place] [facility]  Service: Orthopedics;  Laterality: Left;   APPENDECTOMY     CATARACT EXTRACTION W/ INTRAOCULAR LENS  IMPLANT, BILATERAL  2003   COLONOSCOPY     ESOPHAGOGASTRODUODENOSCOPY     HIP PINNING,CANNULATED Right 02/20/2019   Procedure: CANNULATED HIP PINNING;  Surgeon: XLeandrew Koyanagi MD;  [facility]  Service: Orthopedics;  Laterality: Right;   TONSILLECTOMY  A IV Location/Drains/Wounds Patient Lines/Drains/Airways Status     Active Line/Drains/Airways     Name Placement date Placement time Site Days   Peripheral IV 02/05/22 22 G 1" Anterior;Right;Upper Arm 02/05/22  1411  Arm  less than 1   Incision (Closed) 03/17/19 Hip Left 03/17/19  1252  -- 1056            Intake/Output Last 24 hours No intake or output data in the 24 hours ending 02/05/22 1427  Labs/Imaging Results for orders placed or performed during the hospital encounter of 02/04/22 (from the past 48 hour(s))  CBC with  Differential/Platelet     Status: Abnormal   Collection Time: 02/04/22  8:50 PM  Result Value Ref Range   WBC 12.6 (H) 4.0 - 10.5 K/uL   RBC 3.53 (L) 4.22 - 5.81 MIL/uL   Hemoglobin 10.8 (L) 13.0 - 17.0 g/dL   HCT 32.8 (L) 39.0 - 52.0 %   MCV 92.9 80.0 - 100.0 fL   MCH 30.6 26.0 - 34.0 pg   MCHC 32.9 30.0 - 36.0 g/dL   RDW 13.8 11.5 - 15.5 %   Platelets 121 (L) 150 - 400 K/uL   nRBC 0.0 0.0 - 0.2 %   Neutrophils Relative % 96 %    Comment: CORRECTED RESULTS CALLED TO: Cyd Silence RN AT 2200 ON 02/04/22 BY HOWARDH CORRECTED ON 05/31 AT 2205: PREVIOUSLY REPORTED AS 35    Neutro Abs 4.8 1.7 - 7.7 K/uL   Band Neutrophils 1 %    Comment: CORRECTED RESULTS CALLED TO: CORRECTED ON 05/31 AT 2205: PREVIOUSLY REPORTED AS 3    Lymphocytes Relative 1 %    Comment: CORRECTED RESULTS CALLED TO: Cyd Silence RN AT 2200 ON 02/04/22 BY HOWARDH CORRECTED ON 05/31 AT 2205: PREVIOUSLY REPORTED AS 46    Lymphs Abs 5.8 (H) 0.7 - 4.0 K/uL   Monocytes Relative 2 %    Comment: CORRECTED RESULTS CALLED TO: Cyd Silence RN AT 2200 ON 02/04/22 BY HOWARDH CORRECTED ON 05/31 AT 2205: PREVIOUSLY REPORTED AS 9    Monocytes Absolute 1.1 (H) 0.1 - 1.0 K/uL   Eosinophils Relative 0 %    Comment: CORRECTED RESULTS CALLED TO: Cyd Silence RN AT 2200 ON 02/04/22 BY HOWARDH CORRECTED ON 05/31 AT 2205: PREVIOUSLY REPORTED AS 6    Eosinophils Absolute 0.8 (H) 0.0 - 0.5 K/uL   Basophils Relative 0 %   Basophils Absolute 0.0 0.0 - 0.1 K/uL   RBC Morphology MORPHOLOGY UNREMARKABLE    Smear Review MORPHOLOGY UNREMARKABLE    Myelocytes 0 %    Comment: RUGGIERO, MEGAN RN AT 2200 ON 02/04/22 BY HOWARDH CORRECTED ON 05/31 AT 2205: PREVIOUSLY REPORTED AS 1    Abs Immature Granulocytes 0.10 (H) 0.00 - 0.07 K/uL    Comment: Performed at [facility], [place] [address], [place], [place] [address]  Comprehensive metabolic panel     Status: Abnormal   Collection Time: 02/04/22  8:50 PM  Result Value  Ref Range   Sodium 142 135 - 145 mmol/L   Potassium 3.6 3.5 - 5.1 mmol/L   Chloride 104 98 - 111 mmol/L   CO2 30 22 - 32 mmol/L   Glucose, Bld 115 (H) 70 - 99 mg/dL    Comment: Glucose reference range applies only to samples taken after fasting for at least 8 hours.   BUN 65 (H) 8 - 23 mg/dL   Creatinine, Ser 1.88 (H) 0.61 - 1.24 mg/dL   Calcium 8.8 (L)  8.9 - 10.3 mg/dL   Total Protein 6.2 (L) 6.5 - 8.1 g/dL   Albumin 3.0 (L) 3.5 - 5.0 g/dL   AST 29 15 - 41 U/L   ALT 23 0 - 44 U/L   Alkaline Phosphatase 67 38 - 126 U/L   Total Bilirubin 0.2 (L) 0.3 - 1.2 mg/dL   GFR, Estimated 34 (L) >60 mL/min    Comment: (NOTE) Calculated using the CKD-EPI Creatinine Equation (2021)    Anion gap 8 5 - 15    Comment: Performed at [place] [address], [place], [place] [address]  Magnesium     Status: None   Collection Time: 02/04/22  8:50 PM  Result Value Ref Range   Magnesium 2.0 1.7 - 2.4 mg/dL    Comment: Performed at [facility], [place] [address] [place], [place] [address]  Ammonia     Status: None   Collection Time: 02/04/22  8:50 PM  Result Value Ref Range   Ammonia 14 9 - 35 umol/L    Comment: Performed at [facility], [place] [address], [place], [place] [address]  Lactic acid, plasma     Status: Abnormal   Collection Time: 02/04/22  8:50 PM  Result Value Ref Range   Lactic Acid, Venous 2.1 (HH) 0.5 - 1.9 mmol/L    Comment: CRITICAL RESULT CALLED TO, READ BACK BY AND VERIFIED WITH: RUGGIERO M,RN 02/04/22 2154 WAYK Performed at [facility], [place] [address] [place], [place] [address]   I-Stat venous blood gas, [facility] ED only)     Status: Abnormal   Collection Time: 02/04/22 10:48 PM  Result Value Ref Range   pH, Ven 7.523 (H) 7.25 - 7.43   pCO2, Ven 34.3 (L) 44 - 60 mmHg   pO2, Ven 88 (H) 32 - 45 mmHg   Bicarbonate 28.2 (H) 20.0 - 28.0 mmol/L   TCO2 29 22 - 32 mmol/L   O2 Saturation 98 %   Acid-Base Excess 5.0 (H) 0.0 - 2.0 mmol/L   Sodium 140 135 -  145 mmol/L   Potassium 3.5 3.5 - 5.1 mmol/L   Calcium, Ion 1.05 (L) 1.15 - 1.40 mmol/L   HCT 33.0 (L) 39.0 - 52.0 %   Hemoglobin 11.2 (L) 13.0 - 17.0 g/dL   Sample type VENOUS   Urinalysis, Routine w reflex microscopic     Status: Abnormal   Collection Time: 02/04/22 11:19 PM  Result Value Ref Range   Color, Urine YELLOW YELLOW   APPearance CLEAR CLEAR   Specific Gravity, Urine 1.015 1.005 - 1.030   pH 5.0 5.0 - 8.0   Glucose, UA NEGATIVE NEGATIVE mg/dL   Hgb urine dipstick NEGATIVE NEGATIVE   Bilirubin Urine NEGATIVE NEGATIVE   Ketones, ur NEGATIVE NEGATIVE mg/dL   Protein, ur NEGATIVE NEGATIVE mg/dL   Nitrite NEGATIVE NEGATIVE   Leukocytes,Ua NEGATIVE NEGATIVE   RBC / HPF 0-5 0 - 5 RBC/hpf   WBC, UA 0-5 0 - 5 WBC/hpf   Bacteria, UA RARE (A) NONE SEEN   Squamous Epithelial / LPF 6-10 0 - 5   Mucus PRESENT    Hyaline Casts, UA PRESENT     Comment: Performed at [facility], [place] [address], [place], [place] [address]  Lactic acid, plasma     Status: None   Collection Time: 02/04/22 11:20 PM  Result Value Ref Range   Lactic Acid, Venous 1.0 0.5 - 1.9 mmol/L    Comment: Performed  at [facility], [place] [address] [place], [place] [address]  C-reactive protein     Status: Abnormal   Collection Time: 02/05/22 12:38 AM  Result Value Ref Range   CRP 8.6 (H) <1.0 mg/dL    Comment: Performed at [place] [address], [place], [place] [address]  Vitamin B12     Status: None   Collection Time: 02/05/22 12:38 AM  Result Value Ref Range   Vitamin B-12 729 180 - 914 pg/mL    Comment: (NOTE) This assay is not validated for testing neonatal or myeloproliferative syndrome specimens for Vitamin B12 levels. Performed at [facility], [place] [address], [place], [place] [address]   TSH     Status: Abnormal   Collection Time: 02/05/22 12:38 AM  Result Value Ref Range   TSH 5.015 (H) 0.350 - 4.500 uIU/mL    Comment: Performed by a 3rd Generation assay with a  functional sensitivity of <=0.01 uIU/mL. Performed at [facility], [place] [address] [place], [place] [address]   Folate     Status: None   Collection Time: 02/05/22 12:38 AM  Result Value Ref Range   Folate 6.8 >5.9 ng/mL    Comment: Performed at [facility] [address] [place],  [address]  I-Stat venous blood gas, ED     Status: Abnormal   Collection Time: 02/05/22  1:06 AM  Result Value Ref Range   pH, Ven 7.473 (H) 7.25 - 7.43   pCO2, Ven 41.0 (L) 44 - 60 mmHg   pO2, Ven 107 (H) 32 - 45 mmHg   Bicarbonate 30.0 (H) 20.0 - 28.0 mmol/L   TCO2 31 22 - 32 mmol/L   O2 Saturation 98 %   Acid-Base Excess 6.0 (H) 0.0 - 2.0 mmol/L   Sodium 140 135 - 145 mmol/L   Potassium 3.6 3.5 - 5.1 mmol/L   Calcium, Ion 1.13 (L) 1.15 - 1.40 mmol/L   HCT 32.0 (L) 39.0 - 52.0 %   Hemoglobin 10.9 (L) 13.0 - 17.0 g/dL   Sample type VENOUS   SARS Coronavirus 2 by RT PCR (hospital order, performed in [place] hospital lab) *cepheid single result test* Anterior Nasal Swab     Status: None   Collection Time: 02/05/22  5:40 AM   Specimen: Anterior Nasal Swab  Result Value Ref Range   SARS Coronavirus 2 by RT PCR NEGATIVE NEGATIVE    Comment: (NOTE) SARS-CoV-2 target nucleic acids are NOT DETECTED.  The SARS-CoV-2 RNA is generally detectable in upper and lower respiratory specimens during the acute phase of infection. The lowest concentration of SARS-CoV-2 viral copies this assay can detect is 250 copies / mL. A negative result does not preclude SARS-CoV-2 infection and should not be used as the sole basis for treatment or other patient management decisions.  A negative result may occur with improper specimen collection / handling, submission of specimen other than nasopharyngeal swab, presence of viral mutation(s) within the areas targeted by this assay, and inadequate number of viral copies (<250 copies / mL). A negative result must be combined with clinical observations,  patient history, and epidemiological information.  Fact Sheet for Patients:   https://www.patel.info/  Fact Sheet for Healthcare Providers: https://hall.com/  This test is not yet approved or  cleared by the [place] FDA and has been authorized for detection and/or diagnosis of SARS-CoV-2 by FDA under an Emergency Use Authorization (EUA).  This EUA will remain in  effect (meaning this test can be used) for the duration of the COVID-19 declaration under Section 564(b)(1) of the Act, 21 U.S.C. section 360bbb-3(b)(1), unless the authorization is terminated or revoked sooner.  Performed at [facility], [place] [address] [place], [place] [address]   CBC with Differential/Platelet     Status: Abnormal   Collection Time: 02/05/22  6:53 AM  Result Value Ref Range   WBC 12.6 (H) 4.0 - 10.5 K/uL   RBC 3.56 (L) 4.22 - 5.81 MIL/uL   Hemoglobin 10.6 (L) 13.0 - 17.0 g/dL   HCT 33.0 (L) 39.0 - 52.0 %   MCV 92.7 80.0 - 100.0 fL   MCH 29.8 26.0 - 34.0 pg   MCHC 32.1 30.0 - 36.0 g/dL   RDW 14.2 11.5 - 15.5 %   Platelets 136 (L) 150 - 400 K/uL   nRBC 0.0 0.0 - 0.2 %   Neutrophils Relative % 94 %   Neutro Abs 11.8 (H) 1.7 - 7.7 K/uL   Lymphocytes Relative 4 %   Lymphs Abs 0.5 (L) 0.7 - 4.0 K/uL   Monocytes Relative 2 %   Monocytes Absolute 0.3 0.1 - 1.0 K/uL   Eosinophils Relative 0 %   Eosinophils Absolute 0.0 0.0 - 0.5 K/uL   Basophils Relative 0 %   Basophils Absolute 0.0 0.0 - 0.1 K/uL   nRBC 0 0 /100 WBC   Abs Immature Granulocytes 0.00 0.00 - 0.07 K/uL    Comment: Performed at [facility], [place] [address], [place], [place] [address]  Comprehensive metabolic panel     Status: Abnormal   Collection Time: 02/05/22  9:00 AM  Result Value Ref Range   Sodium 143 135 - 145 mmol/L   Potassium 3.8 3.5 - 5.1 mmol/L   Chloride 109 98 - 111 mmol/L   CO2 28 22 - 32 mmol/L   Glucose, Bld 89 70 - 99 mg/dL    Comment: Glucose reference  range applies only to samples taken after fasting for at least 8 hours.   BUN 54 (H) 8 - 23 mg/dL   Creatinine, Ser 1.51 (H) 0.61 - 1.24 mg/dL   Calcium 8.7 (L) 8.9 - 10.3 mg/dL   Total Protein 5.6 (L) 6.5 - 8.1 g/dL   Albumin 2.7 (L) 3.5 - 5.0 g/dL   AST 25 15 - 41 U/L   ALT 19 0 - 44 U/L   Alkaline Phosphatase 63 38 - 126 U/L   Total Bilirubin 0.3 0.3 - 1.2 mg/dL   GFR, Estimated 44 (L) >60 mL/min    Comment: (NOTE) Calculated using the CKD-EPI Creatinine Equation (2021)    Anion gap 6 5 - 15    Comment: Performed at [facility], [place] [address], [place], [place] [address]  Magnesium     Status: None   Collection Time: 02/05/22  9:00 AM  Result Value Ref Range   Magnesium 2.0 1.7 - 2.4 mg/dL    Comment: Performed at [facility], [place] [address] [place], [place] [address]  Procalcitonin     Status: None   Collection Time: 02/05/22  9:00 AM  Result Value Ref Range   Procalcitonin 2.78 ng/mL    Comment:        Interpretation: PCT > 2 ng/mL: Systemic infection (sepsis) is likely, unless other causes are known. (NOTE)       Sepsis PCT Algorithm           Lower Respiratory Tract  Infection PCT Algorithm    ----------------------------     ----------------------------         PCT < 0.25 ng/mL                PCT < 0.10 ng/mL          Strongly encourage             Strongly discourage   discontinuation of antibiotics    initiation of antibiotics    ----------------------------     -----------------------------       PCT 0.25 - 0.50 ng/mL            PCT 0.10 - 0.25 ng/mL               OR       >80% decrease in PCT            Discourage initiation of                                            antibiotics      Encourage discontinuation           of antibiotics    ----------------------------     -----------------------------         PCT >= 0.50 ng/mL              PCT 0.26 - 0.50 ng/mL               AND       <80% decrease in PCT               Encourage initiation of                                             antibiotics       Encourage continuation           of antibiotics    ----------------------------     -----------------------------        PCT >= 0.50 ng/mL                  PCT > 0.50 ng/mL               AND         increase in PCT                  Strongly encourage                                      initiation of antibiotics    Strongly encourage escalation           of antibiotics                                     -----------------------------                                           PCT <= 0.25 ng/mL  OR                                        > 80% decrease in PCT                                      Discontinue / Do not initiate                                             antibiotics  Performed at [facility], [place] [address], [place], [place] [address]    DG Knee 1-2 Views Right  Result Date: 02/05/2022 CLINICAL DATA:  Swelling and pain in a patient with history of dementia at age 76. EXAM: RIGHT KNEE - 1-2 VIEW COMPARISON:  None Available. FINDINGS: Osteopenia. Degenerative changes about the knee. Patellofemoral enthesopathy. No joint effusion. AP projection mildly limited due to knee held in flexion. No visible fracture. IMPRESSION: Degenerative changes about the knee. No joint effusion. No acute bone finding. Mildly limited assessment on AP projection due to knee position. If there is high concern for knee injury could consider repeat AP images unless patient condition does not allow for straightening of the knee. Electronically Signed   By: Zetta Bills M.D.   On: 02/05/2022 13:04   CT Head Wo Contrast  Result Date: 02/04/2022 CLINICAL DATA:  Mental status change, unknown cause. EXAM: CT HEAD WITHOUT CONTRAST TECHNIQUE: Contiguous axial images were obtained from the base of the skull through the vertex without intravenous contrast. RADIATION  DOSE REDUCTION: This exam was performed according to the departmental dose-optimization program which includes automated exposure control, adjustment of the mA and/or kV according to patient size and/or use of iterative reconstruction technique. COMPARISON:  CT of 08/18/2020 FINDINGS: Brain: Imaging performed x2 with both imaging attempts somewhat motion compromised. Again noted are mild-to-moderate features of cerebrocerebellar atrophy with atrophic ventricular expansion and moderately advanced small vessel disease of the cerebral white matter. There are senescent calcifications in the basal ganglia. No asymmetry consistent with acute infarct, hemorrhage or mass is seen through the motion artifact. There are benign dural calcifications along side of the falx at the vertex. Vascular: There are calcifications in the siphons and distal vertebral arteries. No hyperdense central vessel is seen through the motion artifact. Skull: No fracture or focal bone lesion. The calvarium, skull base and orbits are intact. Sinuses/Orbits: There are old lens replacements. Patchy fluid is again noted in the left mastoid air cells. Right mastoids and both middle ears are clear. There are filling defects in the external auditory canals consistent with cerumen impactions. Membrane thickening in the maxillary sinuses is seen with other sinuses remaining clear. Other: None. IMPRESSION: Motion limited exam grossly negative for acute changes. Chronic findings discussed above. Incidental note of likely bilateral cerumen impactions. Electronically Signed   By: Telford Nab M.D.   On: 02/04/2022 23:48   DG Chest Port 1 View  Result Date: 02/04/2022 CLINICAL DATA:  Altered mental status. EXAM: PORTABLE CHEST 1 VIEW COMPARISON:  Chest x-ray 03/16/2019 FINDINGS: The heart size and mediastinal contours are within normal limits. Again seen are atherosclerotic calcifications of the aorta. Both lungs are clear. The visualized skeletal structures  are unremarkable. IMPRESSION: No active disease. Electronically Signed   By: Ronney Asters M.D.   On: 02/04/2022 21:11   VAS [place] LOWER EXTREMITY VENOUS (DVT)  Result Date: 02/05/2022  Lower Venous DVT Study Patient Name:  Yamil ALFRED Skoog  Date of Exam:   02/05/2022 Medical Rec #: [id number]            Accession #:    [id number] Date of Birth: May 12, 1931             Patient Gender: M Patient Age:   56 years Exam Location:  [facility] Procedure:      VAS [place] LOWER EXTREMITY VENOUS (DVT) Referring Phys: Inda Merlin --------------------------------------------------------------------------------  Indications: Pain.  Risk Factors: None identified. Limitations: Poor ultrasound/tissue interface, patient positioning, patient movement and body habitus. Comparison Study: No prior studies. Performing Technologist: Oliver Hum RVT  Examination Guidelines: A complete evaluation includes B-mode imaging, spectral Doppler, color Doppler, and power Doppler as needed of all accessible portions of each vessel. Bilateral testing is considered an integral part of a complete examination. Limited examinations for reoccurring indications may be performed as noted. The reflux portion of the exam is performed with the patient in reverse Trendelenburg.  +---------+---------------+---------+-----------+----------+--------------+ RIGHT    CompressibilityPhasicitySpontaneityPropertiesThrombus Aging +---------+---------------+---------+-----------+----------+--------------+ CFV      Full           Yes      Yes                                 +---------+---------------+---------+-----------+----------+--------------+ SFJ      Full                                                        +---------+---------------+---------+-----------+----------+--------------+ FV Prox  Full                                                        +---------+---------------+---------+-----------+----------+--------------+  FV Mid   Full                                                        +---------+---------------+---------+-----------+----------+--------------+ FV DistalFull                                                        +---------+---------------+---------+-----------+----------+--------------+ PFV      Full                                                        +---------+---------------+---------+-----------+----------+--------------+ POP      Full           Yes  Yes                                 +---------+---------------+---------+-----------+----------+--------------+ PTV      Full                                                        +---------+---------------+---------+-----------+----------+--------------+ PERO     Full                                                        +---------+---------------+---------+-----------+----------+--------------+   +----+---------------+---------+-----------+----------+--------------+ LEFTCompressibilityPhasicitySpontaneityPropertiesThrombus Aging +----+---------------+---------+-----------+----------+--------------+ CFV Full           Yes      Yes                                 +----+---------------+---------+-----------+----------+--------------+    Summary: RIGHT: - There is no evidence of deep vein thrombosis in the lower extremity.  - No cystic structure found in the popliteal fossa.  LEFT: - No evidence of common femoral vein obstruction.  *See table(s) above for measurements and observations.    Preliminary     Pending Labs Unresulted Labs (From admission, onward)     Start     Ordered   02/05/22 1054  Culture, blood (Routine X 2) w Reflex to ID Panel  BLOOD CULTURE X 2,   R (with TIMED occurrences)      02/05/22 1054   02/05/22 0038  Rapid urine drug screen (hospital performed)  ONCE - STAT,   STAT        02/05/22 0037   02/05/22 0038  Blood gas, venous  Once,   R        02/05/22 0037   Pending   Lactic acid, plasma  Once,   R        Pending            Vitals/Pain Today's Vitals   02/05/22 0630 02/05/22 0631 02/05/22 0715 02/05/22 1400  BP: 102/75  (!) 108/96 131/68  Pulse: (!) 50 73 96 62  Resp: 11 (!) '25 15 17  '$ Temp:      TempSrc:      SpO2: 98% 98% 96% 100%  PainSc:        Isolation Precautions No active isolations  Medications Medications  multivitamin with minerals tablet 1 tablet (has no administration in time range)  ondansetron (ZOFRAN) tablet 4 mg (has no administration in time range)    Or  ondansetron (ZOFRAN) injection 4 mg (has no administration in time range)  enoxaparin (LOVENOX) injection 30 mg (30 mg Subcutaneous Given 02/05/22 1419)  cefTRIAXone (ROCEPHIN) 2 g in sodium chloride 0.9 % 100 mL IVPB (2 g Intravenous New Bag/Given 02/05/22 1416)  lactated ringers infusion (has no administration in time range)  LORazepam (ATIVAN) injection 1 mg (has no administration in time range)  lactated ringers bolus 1,000 mL (0 mLs Intravenous Stopped 02/05/22 0006)  LORazepam (ATIVAN) injection 0.5 mg (0.5 mg Intravenous Given 02/04/22 2315)    Mobility Non ambulatory since arrival  High  fall risk   Focused Assessments Neuro Assessment Handoff:  Swallow screen pass? No          Neuro Assessment: Exceptions to WDL (Pt not opening eyes, rise and fall of chest noted, AMS reported from facility.) Neuro Checks:      Last Documented NIHSS Modified Score:   Has TPA been given? No If patient is a Neuro Trauma and patient is going to OR before floor call report to [facility] nurse: [phone] or [phone]   R Recommendations: See Admitting Provider Note  Report given to:   Additional Notes:

## 2022-02-05 NOTE — ED Notes (Signed)
IV attempt x 2 unsuccessful, IV team consult placed Pt to xray

## 2022-02-05 NOTE — Assessment & Plan Note (Signed)
.   Continuing home regimen of lipid lowering therapy.  

## 2022-02-05 NOTE — ED Provider Notes (Addendum)
  Physical Exam  BP 105/86   Pulse 87   Temp 98.4 F (36.9 C) (Rectal)   Resp (!) 25   SpO2 98%   Physical Exam Constitutional:      General: He is not in acute distress.    Appearance: Normal appearance.  HENT:     Head: Normocephalic and atraumatic.     Nose: No congestion or rhinorrhea.  Eyes:     General:        Right eye: No discharge.        Left eye: No discharge.     Extraocular Movements: Extraocular movements intact.     Pupils: Pupils are equal, round, and reactive to light.  Cardiovascular:     Rate and Rhythm: Normal rate and regular rhythm.     Heart sounds: No murmur heard. Pulmonary:     Effort: No respiratory distress.     Breath sounds: No wheezing or rales.  Abdominal:     General: There is no distension.     Tenderness: There is no abdominal tenderness.  Musculoskeletal:        General: Normal range of motion.     Cervical back: Normal range of motion.  Skin:    General: Skin is warm and dry.  Neurological:     Mental Status: He is alert. He is disoriented.    Procedures  Procedures  ED Course / MDM    Medical Decision Making Amount and/or Complexity of Data Reviewed Labs: ordered. Radiology: ordered.  Risk Prescription drug management.   Patient received in handoff.  Altered mental status of unknown origin.  Pending urinalysis and CT head at time of signout.  Urinalysis and CT head unremarkable.  On reevaluation, patient continues to display evidence of encephalopathy but does have intermittent muscle jerking.  I will consult neurology for possible EEG in subclinical status.  I spoke with Dr. Curly Shores of neurology who believes that these movements are likely myoclonic jerks that may be present in the setting of uremia given the patient's elevated BUN.  Less likely seizure given that these happen during the patient's awake state and subside when the patient is sleeping.  If the admitting team has continued concern for subclinical status neurology  can be reconsulted after correction of BUN.  Patient will require admission to the hospitalist service for altered mental status unknown origin.       Teressa Lower, MD 02/05/22 0009    Teressa Lower, MD 02/05/22 [phone]

## 2022-02-05 NOTE — Assessment & Plan Note (Signed)
Continuing home regimen of daily PPI therapy.  

## 2022-02-05 NOTE — Progress Notes (Signed)
Right lower extremity venous duplex has been completed. Preliminary results can be found in CV Proc through chart review.  Results were given to Dr. Cyd Silence.  02/05/22 1:39 PM Carlos Levering RVT

## 2022-02-05 NOTE — H&P (Signed)
History and Physical    Patient: [name] [name] MRN: [id number] DOA: 02/04/2022  Date of Service: the patient was seen and examined on 02/06/2022  Patient coming from: Home  Chief Complaint:  Chief Complaint  Patient presents with   Altered Mental Status    HPI:   88 year old male with past medical history of dementia, hyperlipidemia, gastroesophageal reflux disease presenting to [facility] emergency department from [facility] with confusion and bouts of agitation, significantly different than baseline.  Patient is a poor historian due to known history of dementia.  Majority of the history is been obtained based on discussions with emergency department staff as well as review of EMS notes.    Assisted-living facility staff notes that since approximately the past 2 days the patient has not been acting at baseline.  Patient has been exhibiting increased lethargy and confusion with bouts of significant agitation.  Staff had also noticed that the patient was beginning to exhibit twitching of the extremities.  Staff denies any recent observation of fevers, vomiting diarrhea or complaints of pain.  Of note, patient assisted living facility also reports the patient fell several days ago without any notable head trauma or injury or loss of consciousness.  Due to patient's progressively worsening confusion EMS was contacted who promptly came to evaluate the patient and brought him into [facility] emergency department for evaluation.  Upon evaluation in the emergency department initial work-up was seemingly unremarkable with unremarkable urinalysis, unremarkable noncontrast CT imaging of the head and unremarkable chest x-ray.  Patient has been found to be suffering from acute kidney injury with BUN of 65 and creatinine of 1.88 and has been initiated on 1 L lactated Ringer's bolus.  Due to ongoing lethargy and confusion the hospitalist group has been contacted to assess the  patient for admission to the hospital.  Review of Systems: Review of Systems  Unable to perform ROS: Dementia    Past Medical History:  Diagnosis Date   Allergic rhinitis, cause unspecified    Anxiety state, unspecified    Arthritis    Benign neoplasm of colon    Depressive disorder, not elsewhere classified    Esophageal reflux    Hyperlipidemia    Irritable bowel syndrome    Obstructive sleep apnea (adult) (pediatric)    Other chronic allergic conjunctivitis    Ulcer 1951   stomach    Past Surgical History:  Procedure Laterality Date   ANTERIOR APPROACH HEMI HIP ARTHROPLASTY Left 03/17/2019   Procedure: ANTERIOR APPROACH HEMI HIP ARTHROPLASTY;  Surgeon: Leandrew Koyanagi, MD;  Location: [place];  Service: Orthopedics;  Laterality: Left;   APPENDECTOMY     CATARACT EXTRACTION W/ INTRAOCULAR LENS  IMPLANT, BILATERAL  2003   COLONOSCOPY     ESOPHAGOGASTRODUODENOSCOPY     HIP PINNING,CANNULATED Right 02/20/2019   Procedure: CANNULATED HIP PINNING;  Surgeon: Leandrew Koyanagi, MD;  Location: [place];  Service: Orthopedics;  Laterality: Right;   TONSILLECTOMY      Social History:  reports that he has never smoked. He has never used smokeless tobacco. He reports that he does not drink alcohol and does not use drugs.  Allergies  Allergen Reactions   Codeine Other (See Comments)    Unknown rxn per MAR   Penicillins Rash    Per MAR Did it involve swelling of the face/tongue/throat, SOB, or low BP? Unknown Did it involve sudden or severe rash/hives, skin peeling, or any reaction on the inside of your mouth or  nose? Unknown Did you need to seek medical attention at a hospital or doctor's office? Unknown When did it last happen? Unknown       If all above answers are "NO", may proceed with cephalosporin use.   Ranitidine Other (See Comments)    Unknown rxn per MAR   Amoxicillin Rash    Per MAR Did it involve swelling of the face/tongue/throat, SOB, or low BP? Unknown Did it involve sudden  or severe rash/hives, skin peeling, or any reaction on the inside of your mouth or nose? Unknown Did you need to seek medical attention at a hospital or doctor's office? Unknown When did it last happen? Unknown       If all above answers are "NO", may proceed with cephalosporin use.    Family History  Problem Relation Age of Onset   Colon cancer Father    Stroke Mother    Heart attack Mother    Colon cancer Mother    Rectal cancer Neg Hx    Stomach cancer Neg Hx     Prior to Admission medications   Medication Sig Start Date End Date Taking? Authorizing Provider  acetaminophen (TYLENOL) 325 MG tablet Take 2 tablets (650 mg total) by mouth every 6 (six) hours as needed for mild pain, fever or headache (pain score 1-3 or temp > 100.5). 03/22/19  Yes [place] Donnamarie Poag, DO  aspirin 81 MG chewable tablet Chew 81 mg by mouth daily.   Yes [provider]  cholecalciferol (VITAMIN D3) 25 MCG (1000 UNIT) tablet Take 1,000 Units by mouth daily.   Yes [provider]  docusate sodium (COLACE) 100 MG capsule Take 1 capsule (100 mg total) by mouth 2 (two) times daily. 03/22/19  Yes [place] Eric J, DO  feeding supplement, ENSURE ENLIVE, (ENSURE ENLIVE) LIQD Take 237 mLs by mouth 2 (two) times daily between meals. Patient taking differently: Take 237 mLs by mouth 3 (three) times daily between meals. 02/24/19  Yes Dana Allan I, MD  ferrous sulfate 324 MG TBEC Take 324 mg by mouth daily with breakfast.   Yes [provider]  hydrOXYzine (ATARAX) 25 MG tablet Take 25 mg by mouth 3 (three) times daily as needed.   Yes [provider]  mirtazapine (REMERON) 7.5 MG tablet Take 7.5 mg by mouth at bedtime.   Yes [provider]  pravastatin (PRAVACHOL) 10 MG tablet Take 10 mg by mouth daily.   Yes [provider]  traZODone (DESYREL) 50 MG tablet Take 1 tablet (50 mg total) by mouth at bedtime. 03/22/19  Yes [place] Eric J, DO  triamcinolone cream  (KENALOG) 0.1 % Apply 1 application. topically 2 (two) times daily. Both hands   Yes [provider]  enoxaparin (LOVENOX) 40 MG/0.4ML injection Inject 0.4 mLs (40 mg total) into the skin daily. Patient not taking: Reported on 02/05/2022 03/17/19   Leandrew Koyanagi, MD  LORazepam (ATIVAN) 0.5 MG tablet Take 1 tablet (0.5 mg total) by mouth every 12 (twelve) hours as needed (agitation). Patient not taking: Reported on 02/05/2022 03/22/19   [place] Donnamarie Poag, DO  methocarbamol (ROBAXIN) 500 MG tablet Take 1 tablet (500 mg total) by mouth every 6 (six) hours as needed for muscle spasms. Patient not taking: Reported on 02/05/2022 03/22/19   [place] Eric J, DO  Multiple Vitamin (MULTIVITAMIN WITH MINERALS) TABS tablet Take 1 tablet by mouth daily. Patient not taking: Reported on 02/05/2022 02/25/19   Bonnell Public, MD  neomycin-bacitracin-polymyxin (NEOSPORIN) [phone] ointment  Apply 1 application topically every evening. Apply to left wrist for skin tear Patient not taking: Reported on 02/05/2022    [provider]  nystatin cream (MYCOSTATIN) Apply 1 application topically 2 (two) times daily. Apply to groin/penis for yeast Patient not taking: Reported on 02/05/2022    [provider]  oxyCODONE-acetaminophen (PERCOCET) 5-325 MG tablet Take 1-2 tablets by mouth every 8 (eight) hours as needed for severe pain. Patient not taking: Reported on 02/05/2022 03/17/19   Leandrew Koyanagi, MD  polyethylene glycol (MIRALAX / GLYCOLAX) 17 g packet Take 17 g by mouth daily as needed for mild constipation. Patient not taking: Reported on 02/05/2022 03/22/19   [place] Eric J, [place]    Physical Exam:  Vitals:   02/05/22 2012 02/05/22 2328 02/06/22 0000 02/06/22 0055  BP:  108/70 124/79   Pulse: 77 82 79 64  Resp: 17 (!) '22 20 13  '$ Temp:      TempSrc:      SpO2: 98% 98% 97% 98%    Constitutional: Lethargic but arousable, confused, minimally verbal, no associated distress.  Patient is cachectic. Skin:  Marked hyperemia warmth and induration of the right lower extremity below the knee down to proximal to the ankle.  No other rashes or lesions are seen.  Poor skin turgor noted. Eyes: Pupils are equally reactive to light.  No evidence of scleral icterus or conjunctival pallor.  ENMT: Dry mucous membranes noted.  Posterior pharynx clear of any exudate or lesions.   Neck: normal, supple, no masses, no thyromegaly.  No evidence of jugular venous distension.   Respiratory: Notable bibasilar rales without any evidence of wheezing.  Normal respiratory effort. No accessory muscle use.  Cardiovascular: Regular rate and rhythm, no murmurs / rubs / gallops.  Edematous right lower extremity.  2+ pedal pulses. No carotid bruits.  Chest:   nontender without crepitus or deformity.   Back:   Nontender without crepitus or deformity. Abdomen: Abdomen is soft and nontender.  No evidence of intra-abdominal masses.  Positive bowel sounds noted in all quadrants.   Musculoskeletal: No joint deformity upper and lower extremities. Good ROM, no contractures.  Notably poor muscle tone. Neurologic: Patient extremely lethargic but arousable and nonverbal.  Patient not consistently following commands.  Sensation is seemingly intact.  Patient spontaneously moving all 4 extremities.   Psychiatric: Unable to assess due to substantial lethargy and confusion.  Patient currently does not seem to possess insight as to their current situation.  Data Reviewed:  I have personally reviewed and interpreted labs, imaging.  Significant findings are:  CBC revealing white blood cell count of 12.6, hemoglobin 10.8, hematocrit 32.8 platelet count 121  chemistry revealing BUN 65, creatinine 1.88 ammonia 14  lactic acid 2.1 VBG revealing pH 7.52, PCO2 of 34.3, PO2 88  Urinalysis unremarkable.    EKG: Personally reviewed.  Rhythm is atrial fibrillation with heart rate of 91 bpm.  QTc 526 ms no dynamic ST segment changes  appreciated.   Assessment and Plan: * Acute metabolic encephalopathy Patient presenting from assisted living facility with 2-day history of progressively worsening confusion and bouts of agitation Presentation complicated by underlying dementia On evaluation patient is exhibiting ongoing myoclonic jerking in the setting of acute kidney injury Patient also has redness and induration of the RLE suggestive of a cellulitis.   Hydrating patient gently with intravenous isotonic fluid Treating for suspected RLE cellulitis with intravenous ceftriaxone Obtaining noncontrast CT imaging of the head, urinalysis, COVID-19 PCR testing, ammonia, vitamin B12,  folate, TSH, inflammatory markers, VBG Monitoring for symptomatic improvement  AKI (acute kidney injury) ([place]) Etiology unclear, possibly due to volume depletion Hydrating patient with intravenous isotonic fluids Urinalysis reveals no evidence of urinary tract infection Strict input and output monitoring Avoiding nephrotoxic agents if at all possible Monitor renal function and electrolytes with serial chemistries  Cellulitis of right lower extremity Notable redness induration and warmth of the right lower extremity on exam This may possibly be contributing to the patient's encephalopathy Due to concurrent edema of the extremity we will additionally obtain an ultrasound to rule out DVT although this is unlikely Treating with intravenous ceftriaxone Intravenous fluids as noted above Obtaining blood cultures  Lactic acidosis Lactic acidosis likely secondary to volume depletion and suspected concurrent infection Hydrate patient intravenous isotonic fluids, treating with intravenous antibiotics Monitoring serial lactic acid levels to ensure downtrending and resolution.   Atrial fibrillation ([place]) incidental finding of atrial fibrillation on admission EKG  Unable to assess for associated symptoms as patient is currently nonverbal Afib possibly  secondary to underlying infection and uremia and may be self limiting.   Obtain TSH, Magnesium ECG reveals no dynamic ST segment change to suggest ischemia. Rate controlled, no AV nodal blocking agents necessary currently Continuing to gently hydrate, treating with antibiotics Monitoring on telemetry Will obtain Echo in AM Patient is at significant fall risk so I don't believe that anticoagulation would be prudent at this time  Myoclonic jerking ER provider has discussed witnessed jerking with Dr. Curly Shores with neurology  believes patient may be suffering from uremia as the cause.  Dr. Curly Shores recommends intravenous volume resuscitation, resolving the acute kidney injury and uremia and reassessing.  She further recommends that if patient continues to exhibit jerking once acute kidney injury/uremia has resolved and to consider an EEG and obtaining formal neurology consultation.  Mixed hyperlipidemia Continuing home regimen of lipid lowering therapy.   GERD without esophagitis Continuing home regimen of daily PPI therapy.   Dementia without behavioral disturbance ([place]) Presence of advanced dementia complicates evaluation of this patient presenting with acute metabolic encephalopathy Minimizing uncomfortable stimuli Minimizing mood altering and sedating agents Frequent redirection by staff Fall precautions   Prolonged QT interval Notable prolonged QT on admission EKG Reviewing electrolytes including potassium and magnesium levels and will replace as necessary  Monitoring patient on telemetry  Avoiding QT prolonging agents  Repeat EKG in the morning         Code Status:  DNR  code status   Consults: None, will consider neurology consultation if myoclonic jerking persists.  Severity of Illness:  The appropriate patient status for this patient is INPATIENT. Inpatient status is judged to be reasonable and necessary in order to provide the required intensity of service to ensure  the patient's safety. The patient's presenting symptoms, physical exam findings, and initial radiographic and laboratory data in the context of their chronic comorbidities is felt to place them at high risk for further clinical deterioration. Furthermore, it is not anticipated that the patient will be medically stable for discharge from the hospital within 2 midnights of admission.   * I certify that at the point of admission it is my clinical judgment that the patient will require inpatient hospital care spanning beyond 2 midnights from the point of admission due to high intensity of service, high risk for further deterioration and high frequency of surveillance required.*  Author:  Vernelle Emerald MD  02/06/2022 1:58 AM

## 2022-02-05 NOTE — ED Notes (Signed)
POA wife at bedside, requesting update on status. Hospitalist at bedside.
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
## 2022-02-06 ENCOUNTER — Inpatient Hospital Stay (HOSPITAL_COMMUNITY): Payer: No Typology Code available for payment source

## 2022-02-06 DIAGNOSIS — R9431 Abnormal electrocardiogram [ECG] [EKG]: Secondary | ICD-10-CM | POA: Diagnosis present

## 2022-02-06 DIAGNOSIS — I4891 Unspecified atrial fibrillation: Secondary | ICD-10-CM | POA: Diagnosis not present

## 2022-02-06 DIAGNOSIS — G9341 Metabolic encephalopathy: Secondary | ICD-10-CM | POA: Diagnosis not present

## 2022-02-06 DIAGNOSIS — N179 Acute kidney failure, unspecified: Secondary | ICD-10-CM | POA: Diagnosis not present

## 2022-02-06 LAB — COMPREHENSIVE METABOLIC PANEL
ALT: 22 U/L (ref 0–44)
AST: 25 U/L (ref 15–41)
Albumin: 2.4 g/dL — ABNORMAL LOW (ref 3.5–5.0)
Alkaline Phosphatase: 65 U/L (ref 38–126)
Anion gap: 9 (ref 5–15)
BUN: 46 mg/dL — ABNORMAL HIGH (ref 8–23)
CO2: 27 mmol/L (ref 22–32)
Calcium: 8.7 mg/dL — ABNORMAL LOW (ref 8.9–10.3)
Chloride: 108 mmol/L (ref 98–111)
Creatinine, Ser: 1.3 mg/dL — ABNORMAL HIGH (ref 0.61–1.24)
GFR, Estimated: 53 mL/min — ABNORMAL LOW (ref >60)
Glucose, Bld: 75 mg/dL (ref 70–99)
Potassium: 4.2 mmol/L (ref 3.5–5.1)
Sodium: 144 mmol/L (ref 135–145)
Total Bilirubin: 0.4 mg/dL (ref 0.3–1.2)
Total Protein: 5.7 g/dL — ABNORMAL LOW (ref 6.5–8.1)

## 2022-02-06 LAB — ECHOCARDIOGRAM COMPLETE
AR max vel: 2.85 cm2
AV Peak grad: 3.7 mmHg
Ao pk vel: 0.96 m/s
MV M vel: 4.65 m/s
MV Peak grad: 86.5 mmHg
S' Lateral: 4.6 cm
Single Plane A4C EF: 22.3 %

## 2022-02-06 LAB — MAGNESIUM: Magnesium: 1.8 mg/dL (ref 1.7–2.4)

## 2022-02-06 MED ORDER — LACTATED RINGERS IV SOLN: INTRAVENOUS | Status: DC

## 2022-02-06 NOTE — Progress Notes (Signed)
CSW contacted [facility] to inquire about patient's baseline and ability to return there but no answer at facility. Will attempt again at a later time.   Gilmore Laroche, MSW, [facility]

## 2022-02-06 NOTE — Evaluation (Signed)
Physical Therapy Evaluation Patient Details Name: [name] [name] MRN: [id number] DOB: [birth year] Today's Date: 02/06/2022  History of Present Illness  Pt adm from ALF with AMS and AKI. PMH - dementia, rt hip fx, lt hip fx  Clinical Impression  Pt admitted with above diagnosis and presents to PT with functional limitations due to deficits listed below (See PT problem list). Pt needs skilled PT to maximize independence and safety to allow discharge back to ALF with HHPT(if pt not back to baseline). Per chart pt is typically ambulatory although not necessarily safely at ALF. Today able to stand with assist but didn't ambulate. Expect pt will make steady progress if mental status returns to baseline. Familiar environment and staff of ALF should be helpful to return of mobility.         Recommendations for follow up therapy are one component of a multi-disciplinary discharge planning process, led by the attending physician.  Recommendations may be updated based on patient status, additional functional criteria and insurance authorization.  Follow Up Recommendations Home health PT (at ALF)    Assistance Recommended at Discharge Frequent or constant Supervision/Assistance  Patient can return home with the following  Two people to help with walking and/or transfers;A lot of help with bathing/dressing/bathroom;Direct supervision/assist for medications management;Assistance with feeding    Equipment Recommendations None recommended by PT  Recommendations for Other Services       Functional Status Assessment Patient has had a recent decline in their functional status and/or demonstrates limited ability to make significant improvements in function in a reasonable and predictable amount of time     Precautions / Restrictions Precautions Precautions: Fall;Other (comment) Precaution Comments: agitation at times      Mobility  Bed Mobility Overal bed mobility: Needs Assistance Bed Mobility:  Supine to Sit, Sit to Supine     Supine to sit: Mod assist, HOB elevated Sit to supine: Mod assist   General bed mobility comments: Assist to bring legs off of bed, elevate trunk into sitting, and bring hips to EOB. Assist to lower trunk and bring legs back up onto bed    Transfers Overall transfer level: Needs assistance Equipment used:  (Use the back of a chair turned around backwards) Transfers: Sit to/from Stand Sit to Stand: Mod assist           General transfer comment: Assist to bring hips up and for balance.    Ambulation/Gait             Pre-gait activities: Pt stood x 15 sec at bedside holding onto back of chair. Min to mod assist to maintain static standing    Stairs            Wheelchair Mobility    Modified Rankin (Stroke Patients Only)       Balance Overall balance assessment: Needs assistance Sitting-balance support: No upper extremity supported, Feet supported Sitting balance-Leahy Scale: Fair     Standing balance support: Bilateral upper extremity supported Standing balance-Leahy Scale: Poor Standing balance comment: UE support and min to mod assist for static standing                             Pertinent Vitals/Pain Pain Assessment Pain Assessment: PAINAD Breathing: normal Negative Vocalization: none Facial Expression: smiling or inexpressive Body Language: relaxed Consolability: no need to console PAINAD Score: 0    Home Living Family/patient expects to be discharged to:: Assisted living  Home Equipment: Conservation officer, nature (2 wheels)      Prior Function Prior Level of Function : Patient poor historian/Family not available             Mobility Comments: Per chart pt was ambulatory at facility although with safety concerns. Supposed to use walker. History of falls       Hand Dominance        Extremity/Trunk Assessment   Upper Extremity Assessment Upper Extremity Assessment:  Generalized weakness (to observation)    Lower Extremity Assessment Lower Extremity Assessment: Generalized weakness (to observation)       Communication   Communication: HOH  Cognition Arousal/Alertness: Awake/alert Behavior During Therapy: Flat affect Overall Cognitive Status: No family/caregiver present to determine baseline cognitive functioning                                 General Comments: Dementia at baseline. Pt with eyes closed throughout but would respond to and assist with mobility. Trying to get hand mitts off.        General Comments      Exercises     Assessment/Plan    PT Assessment Patient needs continued PT services  PT Problem List Decreased strength;Decreased activity tolerance;Decreased balance;Decreased mobility       PT Treatment Interventions DME instruction;Gait training;Functional mobility training;Therapeutic activities;Therapeutic exercise;Balance training;Patient/family education    PT Goals (Current goals can be found in the Care Plan section)  Acute Rehab PT Goals Patient Stated Goal: Pt unable to state PT Goal Formulation: Patient unable to participate in goal setting Time For Goal Achievement: 02/20/22 Potential to Achieve Goals: Fair    Frequency Min 3X/week     Co-evaluation               AM-PAC PT "6 Clicks" Mobility  Outcome Measure Help needed turning from your back to your side while in a flat bed without using bedrails?: A Lot Help needed moving from lying on your back to sitting on the side of a flat bed without using bedrails?: A Lot Help needed moving to and from a bed to a chair (including a wheelchair)?: Total Help needed standing up from a chair using your arms (e.g., wheelchair or bedside chair)?: A Lot Help needed to walk in hospital room?: Total Help needed climbing 3-5 steps with a railing? : Total 6 Click Score: 9    End of Session Equipment Utilized During Treatment: Gait belt Activity  Tolerance: Other (comment) (Limited by cognition) Patient left: in bed;with call bell/phone within reach;with bed alarm set Nurse Communication: Mobility status PT Visit Diagnosis: Other abnormalities of gait and mobility (R26.89);Unsteadiness on feet (R26.81);History of falling (Z91.81);Repeated falls (R29.6)    Time: 9147-8295 PT Time Calculation (min) (ACUTE ONLY): 21 min   Charges:   PT Evaluation $PT Eval Moderate Complexity: [place] 02/06/2022, 1:28 PM

## 2022-02-06 NOTE — Progress Notes (Signed)
PROGRESS NOTE    [name] [name]  JYN:[id number] DOB: 01-03-1931 DOA: 02/04/2022 PCP: Orvis Brill, [facility]   Chief Complaint  Patient presents with   Altered Mental Status    Brief Narrative:   88 year old male with past medical history of dementia, hyperlipidemia, gastroesophageal reflux disease presenting to [facility] emergency department from [facility] with confusion and bouts of agitation, significantly different than baseline.   Patient is a poor historian due to known history of dementia.     Assisted-living facility staff notes that since approximately the past 2 days the patient has not been acting at baseline.  Patient has been exhibiting increased lethargy and confusion with bouts of significant agitation.  Staff had also noticed that the patient was beginning to exhibit twitching of the extremities.  Staff denies any recent observation of fevers, vomiting diarrhea or complaints of pain.   Of note, patient assisted living facility also reports the patient fell several days ago, he does have significant right forehead bruise.   Due to patient's progressively worsening confusion EMS was contacted who promptly came to evaluate the patient and brought him into [facility] emergency department for evaluation.   Upon evaluation in the emergency department initial work-up was seemingly unremarkable with unremarkable urinalysis, unremarkable noncontrast CT imaging of the head and unremarkable chest x-ray.  Patient has been found to be suffering from acute kidney injury with BUN of 65 and creatinine of 1.88 and has been initiated on 1 L lactated Ringer's bolus.  Due to ongoing lethargy and confusion the hospitalist group has been contacted to assess the patient for admission to the hospital.  Assessment & Plan:   Principal Problem:   Acute metabolic encephalopathy Active Problems:   AKI (acute kidney injury) ([place])   Lactic acidosis   Cellulitis of  right lower extremity   Mixed hyperlipidemia   Myoclonic jerking   Atrial fibrillation (HCC)   GERD without esophagitis   Dementia without behavioral disturbance (HCC)   Prolonged QT interval   Acute metabolic encephalopathy -With underlying baseline dementia. -Acute encephalopathy secondary to dehydration, uremia, AKI and cellulitis. -CT head with no acute findings. -TSH mildly elevated at 5, -B12 within normal limit -Ammonia normal limit  AKI (acute kidney injury) (HCC) -Volume depletion and dehydration, continue to hold nephrotoxic medications, and continue with IV fluids  Cellulitis of right lower extremity - Notable redness induration and warmth of the right lower extremity on exam - Venous Dopplers negative for DVT - follow on Blood cultures - Continue with IV fluids    Atrial fibrillation (HCC) - incidental finding of atrial fibrillation on admission EKG  - Unable to assess for associated symptoms as patient is currently nonverbal - Afib possibly secondary to underlying infection and uremia and may be self limiting.   -TSH mildly elevated at 5, no evidence of hyperthyroidism -Heart rate is controlled -Poor candidate for anticoagulation due to his advanced dementia, and high risk for fall   Myoclonic jerking - ER provider has discussed witnessed jerking with Dr. Curly Shores with neurology  believes patient may be suffering from uremia as the cause.  - Dr. Curly Shores recommends intravenous volume resuscitation, resolving the acute kidney injury and uremia and reassessing.  - She further recommends that if patient continues to exhibit jerking once acute kidney injury/uremia has resolved and to consider an EEG and obtaining formal neurology consultation.   Mixed hyperlipidemia - Continuing home regimen of lipid lowering therapy.     GERD without esophagitis - Continuing  home regimen of daily PPI therapy.     Dementia without behavioral disturbance (HCC) - Presence of  advanced dementia complicates evaluation of this patient presenting with acute metabolic encephalopathy - Minimizing uncomfortable stimuli - Minimizing mood altering and sedating agents - Frequent redirection by staff - Fall precautions     Prolonged QT interval - Avoiding QT prolonging agents        DVT prophylaxis: Lovenox Code Status: DNR Family Communication: none at bedside Disposition:   Status is: Inpatient Remains altered, needed IV fluids, remains n.p.o.   Consultants:  None  Subjective:  Patient unable to provide any complaints given his dementia, no significant events as discussed with staff  Objective: Vitals:   02/06/22 0600 02/06/22 0740 02/06/22 1205 02/06/22 1423  BP:  106/68 124/70   Pulse: 76 72 77   Resp: '13 15 14   '$ Temp:  97.6 F (36.4 C) 97.9 F (36.6 C)   TempSrc:  Oral Oral   SpO2: 97% 99% 99%   Weight:    59.2 kg    Intake/Output Summary (Last 24 hours) at 02/06/2022 1502 Last data filed at 02/06/2022 0400 Gross per 24 hour  Intake 900 ml  Output --  Net 900 ml   Filed Weights   02/06/22 1423  Weight: 59.2 kg    Examination:  Patient eyes closed, sleeping, does not follow commands or answer any questions, extremely frail, deconditioned and demented. Symmetrical Chest wall movement, Good air movement bilaterally, CTAB RRR,No Gallops,Rubs or new Murmurs, No Parasternal Heave +ve B.Sounds, Abd Soft, No tenderness, No rebound - guarding or rigidity. No Cyanosis, Clubbing or edema, No new Rash or bruise      Data Reviewed: I have personally reviewed following labs and imaging studies  CBC: Recent Labs  Lab 02/04/22 2050 02/04/22 2248 02/05/22 0106 02/05/22 0653  WBC 12.6*  --   --  12.6*  NEUTROABS 4.8  --   --  11.8*  HGB 10.8* 11.2* 10.9* 10.6*  HCT 32.8* 33.0* 32.0* 33.0*  MCV 92.9  --   --  92.7  PLT 121*  --   --  136*    Basic Metabolic Panel: Recent Labs  Lab 02/04/22 2050 02/04/22 2248 02/05/22 0106  02/05/22 0900 02/06/22 0222  NA 142 140 140 143 144  K 3.6 3.5 3.6 3.8 4.2  CL 104  --   --  109 108  CO2 30  --   --  28 27  GLUCOSE 115*  --   --  89 75  BUN 65*  --   --  54* 46*  CREATININE 1.88*  --   --  1.51* 1.30*  CALCIUM 8.8*  --   --  8.7* 8.7*  MG 2.0  --   --  2.0 1.8    GFR: CrCl cannot be calculated (Unknown ideal weight.).  Liver Function Tests: Recent Labs  Lab 02/04/22 2050 02/05/22 0900 02/06/22 0222  AST '29 25 25  '$ ALT '23 19 22  '$ ALKPHOS 67 63 65  BILITOT 0.2* 0.3 0.4  PROT 6.2* 5.6* 5.7*  ALBUMIN 3.0* 2.7* 2.4*    CBG: No results for input(s): GLUCAP in the last 168 hours.   Recent Results (from the past 240 hour(s))  SARS Coronavirus 2 by RT PCR (hospital order, performed in [facility] hospital lab) *cepheid single result test* Anterior Nasal Swab     Status: None   Collection Time: 02/05/22  5:40 AM   Specimen: Anterior Nasal Swab  Result  Value Ref Range Status   SARS Coronavirus 2 by RT PCR NEGATIVE NEGATIVE Final    Comment: (NOTE) SARS-CoV-2 target nucleic acids are NOT DETECTED.  The SARS-CoV-2 RNA is generally detectable in upper and lower respiratory specimens during the acute phase of infection. The lowest concentration of SARS-CoV-2 viral copies this assay can detect is 250 copies / mL. A negative result does not preclude SARS-CoV-2 infection and should not be used as the sole basis for treatment or other patient management decisions.  A negative result may occur with improper specimen collection / handling, submission of specimen other than nasopharyngeal swab, presence of viral mutation(s) within the areas targeted by this assay, and inadequate number of viral copies (<250 copies / mL). A negative result must be combined with clinical observations, patient history, and epidemiological information.  Fact Sheet for Patients:   https://www.patel.info/  Fact Sheet for Healthcare  Providers: https://hall.com/  This test is not yet approved or  cleared by the [place] FDA and has been authorized for detection and/or diagnosis of SARS-CoV-2 by FDA under an Emergency Use Authorization (EUA).  This EUA will remain in effect (meaning this test can be used) for the duration of the COVID-19 declaration under Section 564(b)(1) of the Act, 21 U.S.C. section 360bbb-3(b)(1), unless the authorization is terminated or revoked sooner.  Performed at [facility], [place] [address] [place], [place] [address]   Culture, blood (Routine X 2) w Reflex to ID Panel     Status: None (Preliminary result)   Collection Time: 02/05/22 10:54 AM   Specimen: BLOOD  Result Value Ref Range Status   Specimen Description BLOOD RIGHT ANTECUBITAL  Final   Special Requests   Final    BOTTLES DRAWN AEROBIC AND ANAEROBIC Blood Culture adequate volume   Culture   Final    NO GROWTH < 24 HOURS Performed at [facility], [place] [employer] [address], [place], [place] [address]    Report Status PENDING  Incomplete  Culture, blood (Routine X 2) w Reflex to ID Panel     Status: None (Preliminary result)   Collection Time: 02/05/22 10:59 AM   Specimen: BLOOD  Result Value Ref Range Status   Specimen Description BLOOD LEFT ANTECUBITAL  Final   Special Requests   Final    BOTTLES DRAWN AEROBIC AND ANAEROBIC Blood Culture adequate volume   Culture   Final    NO GROWTH < 24 HOURS Performed at [facility], [place] [address], [place], [place] [address]    Report Status PENDING  Incomplete         Radiology Studies: DG Knee 1-2 Views Right  Result Date: 02/05/2022 CLINICAL DATA:  Swelling and pain in a patient with history of dementia at age 36. EXAM: RIGHT KNEE - 1-2 VIEW COMPARISON:  None Available. FINDINGS: Osteopenia. Degenerative changes about the knee. Patellofemoral enthesopathy. No joint effusion. AP projection mildly limited due to knee held in flexion. No  visible fracture. IMPRESSION: Degenerative changes about the knee. No joint effusion. No acute bone finding. Mildly limited assessment on AP projection due to knee position. If there is high concern for knee injury could consider repeat AP images unless patient condition does not allow for straightening of the knee. Electronically Signed   By: Zetta Bills M.D.   On: 02/05/2022 13:04   CT Head Wo Contrast  Result Date: 02/04/2022 CLINICAL DATA:  Mental status change, unknown cause. EXAM: CT HEAD WITHOUT CONTRAST TECHNIQUE: Contiguous axial images were  obtained from the base of the skull through the vertex without intravenous contrast. RADIATION DOSE REDUCTION: This exam was performed according to the departmental dose-optimization program which includes automated exposure control, adjustment of the mA and/or kV according to patient size and/or use of iterative reconstruction technique. COMPARISON:  CT of 08/18/2020 FINDINGS: Brain: Imaging performed x2 with both imaging attempts somewhat motion compromised. Again noted are mild-to-moderate features of cerebrocerebellar atrophy with atrophic ventricular expansion and moderately advanced small vessel disease of the cerebral white matter. There are senescent calcifications in the basal ganglia. No asymmetry consistent with acute infarct, hemorrhage or mass is seen through the motion artifact. There are benign dural calcifications along side of the falx at the vertex. Vascular: There are calcifications in the siphons and distal vertebral arteries. No hyperdense central vessel is seen through the motion artifact. Skull: No fracture or focal bone lesion. The calvarium, skull base and orbits are intact. Sinuses/Orbits: There are old lens replacements. Patchy fluid is again noted in the left mastoid air cells. Right mastoids and both middle ears are clear. There are filling defects in the external auditory canals consistent with cerumen impactions. Membrane thickening  in the maxillary sinuses is seen with other sinuses remaining clear. Other: None. IMPRESSION: Motion limited exam grossly negative for acute changes. Chronic findings discussed above. Incidental note of likely bilateral cerumen impactions. Electronically Signed   By: Telford Nab M.D.   On: 02/04/2022 23:48   DG Chest Port 1 View  Result Date: 02/04/2022 CLINICAL DATA:  Altered mental status. EXAM: PORTABLE CHEST 1 VIEW COMPARISON:  Chest x-ray 03/16/2019 FINDINGS: The heart size and mediastinal contours are within normal limits. Again seen are atherosclerotic calcifications of the aorta. Both lungs are clear. The visualized skeletal structures are unremarkable. IMPRESSION: No active disease. Electronically Signed   By: Ronney Asters M.D.   On: 02/04/2022 21:11   ECHOCARDIOGRAM COMPLETE  Result Date: 02/06/2022    ECHOCARDIOGRAM REPORT   Patient Name:   [name] [name] Date of Exam: 02/06/2022 Medical Rec #:  [id number]           Height:       72.0 in Accession #:    [id number]          Weight:       150.0 lb Date of Birth:  1931/06/17            BSA:          1.885 m Patient Age:    57 years            BP:           122/72 mmHg Patient Gender: M                   HR:           85 bpm. Exam Location:  Inpatient Procedure: 2D Echo, Cardiac Doppler and Color Doppler Indications:    Atrial Fibrillation  History:        Patient has no prior history of Echocardiogram examinations.                 Arrythmias:Atrial Fibrillation.  Sonographer:    Jefferey Pica Referring Phys: [id number] G. L. Garcia  1. Globaly hypokinesis mildly worse in the inferior, inferolateral segements from base-apex. Left ventricular ejection fraction, by estimation, is 30 to 35%. The left ventricle has moderately decreased function. The left ventricular internal cavity size  was mildly dilated. Left ventricular diastolic function could  not be evaluated.  2. Right ventricular systolic function is mildly reduced. The right  ventricular size is normal. There is mildly elevated pulmonary artery systolic pressure.  3. Left atrial size was mildly dilated.  4. Right atrial size was mildly dilated.  5. The mitral valve is degenerative. Mild to moderate mitral valve regurgitation.  6. There is mild calcification of the aortic valve. Aortic valve regurgitation is mild to moderate. Aortic valve sclerosis/calcification is present, without any evidence of aortic stenosis.  7. Aortic ascending aortic aneurysm 4.5 cm.  8. The inferior vena cava is dilated in size with >50% respiratory variability, suggesting right atrial pressure of 8 mmHg. Comparison(s): No prior Echocardiogram. FINDINGS  Left Ventricle: Globaly hypokinesis mildly worse in the inferior, inferolateral segements from base-apex. Left ventricular ejection fraction, by estimation, is 30 to 35%. The left ventricle has moderately decreased function. The left ventricular internal cavity size was mildly dilated. There is borderline left ventricular hypertrophy. Left ventricular diastolic function could not be evaluated. Right Ventricle: The right ventricular size is normal. Right ventricular systolic function is mildly reduced. There is mildly elevated pulmonary artery systolic pressure. The tricuspid regurgitant velocity is 2.53 m/s, and with an assumed right atrial pressure of 15 mmHg, the estimated right ventricular systolic pressure is 62.9 mmHg. Left Atrium: Left atrial size was mildly dilated. Right Atrium: Right atrial size was mildly dilated. Pericardium: There is no evidence of pericardial effusion. Mitral Valve: The mitral valve is degenerative in appearance. Mild to moderate mitral valve regurgitation. Tricuspid Valve: The tricuspid valve is normal in structure. Tricuspid valve regurgitation is trivial. Aortic Valve: There is mild calcification of the aortic valve. Aortic valve regurgitation is mild to moderate. Aortic valve sclerosis/calcification is present, without any  evidence of aortic stenosis. Aortic valve peak gradient measures 3.7 mmHg. Pulmonic Valve: The pulmonic valve was not well visualized. Pulmonic valve regurgitation is not visualized. Aorta: Ascending aortic aneurysm 4.5 cm. Venous: The inferior vena cava is dilated in size with greater than 50% respiratory variability, suggesting right atrial pressure of 8 mmHg. IAS/Shunts: No atrial level shunt detected by color flow Doppler.  LEFT VENTRICLE PLAX 2D LVIDd:         5.80 cm LVIDs:         4.60 cm LV PW:         1.00 cm LV IVS:        1.10 cm LVOT diam:     2.10 cm LV SV:         74 LV SV Index:   39 LVOT Area:     3.46 cm  LV Volumes (MOD) LV vol d, MOD A4C: 130.0 ml LV vol s, MOD A4C: 101.0 ml LV SV MOD A4C:     130.0 ml RIGHT VENTRICLE          IVC RV Basal diam:  3.00 cm  IVC diam: 2.10 cm LEFT ATRIUM             Index        RIGHT ATRIUM           Index LA diam:        3.60 cm 1.91 cm/m   RA Area:     19.40 cm LA Vol (A2C):   63.7 ml 33.79 ml/m  RA Volume:   61.10 ml  32.41 ml/m LA Vol (A4C):   46.0 ml 24.40 ml/m LA Biplane Vol: 56.2 ml 29.81 ml/m  AORTIC VALVE AV Area (Vmax): 2.85 cm AV Vmax:  95.53 cm/s AV Peak Grad:   3.7 mmHg LVOT Vmax:      78.65 cm/s LVOT Vmean:     51.825 cm/s LVOT VTI:       0.213 m  AORTA Ao Root diam: 5.00 cm Ao Asc diam:  4.45 cm MR Peak grad: 86.5 mmHg   TRICUSPID VALVE MR Vmax:      465.00 cm/s TR Peak grad:   25.6 mmHg                           TR Vmax:        253.00 cm/s                            SHUNTS                           Systemic VTI:  0.21 m                           Systemic Diam: 2.10 cm Phineas Inches Electronically signed by Phineas Inches Signature Date/Time: 02/06/2022/9:33:31 AM    Final    VAS [place] LOWER EXTREMITY VENOUS (DVT)  Result Date: 02/05/2022  Lower Venous DVT Study Patient Name:  [name] [name]  Date of Exam:   02/05/2022 Medical Rec #: [id number]            Accession #:    [id number] Date of Birth: 1930/11/25             Patient Gender: M Patient  Age:   35 years Exam Location:  [facility] Procedure:      VAS [place] LOWER EXTREMITY VENOUS (DVT) Referring Phys: Inda Merlin --------------------------------------------------------------------------------  Indications: Pain.  Risk Factors: None identified. Limitations: Poor ultrasound/tissue interface, patient positioning, patient movement and body habitus. Comparison Study: No prior studies. Performing Technologist: Oliver Hum RVT  Examination Guidelines: A complete evaluation includes B-mode imaging, spectral Doppler, color Doppler, and power Doppler as needed of all accessible portions of each vessel. Bilateral testing is considered an integral part of a complete examination. Limited examinations for reoccurring indications may be performed as noted. The reflux portion of the exam is performed with the patient in reverse Trendelenburg.  +---------+---------------+---------+-----------+----------+--------------+ RIGHT    CompressibilityPhasicitySpontaneityPropertiesThrombus Aging +---------+---------------+---------+-----------+----------+--------------+ CFV      Full           Yes      Yes                                 +---------+---------------+---------+-----------+----------+--------------+ SFJ      Full                                                        +---------+---------------+---------+-----------+----------+--------------+ FV Prox  Full                                                        +---------+---------------+---------+-----------+----------+--------------+ FV Mid   Full                                                        +---------+---------------+---------+-----------+----------+--------------+  FV DistalFull                                                        +---------+---------------+---------+-----------+----------+--------------+ PFV      Full                                                         +---------+---------------+---------+-----------+----------+--------------+ POP      Full           Yes      Yes                                 +---------+---------------+---------+-----------+----------+--------------+ PTV      Full                                                        +---------+---------------+---------+-----------+----------+--------------+ PERO     Full                                                        +---------+---------------+---------+-----------+----------+--------------+   +----+---------------+---------+-----------+----------+--------------+ LEFTCompressibilityPhasicitySpontaneityPropertiesThrombus Aging +----+---------------+---------+-----------+----------+--------------+ CFV Full           Yes      Yes                                 +----+---------------+---------+-----------+----------+--------------+    Summary: RIGHT: - There is no evidence of deep vein thrombosis in the lower extremity.  - No cystic structure found in the popliteal fossa.  LEFT: - No evidence of common femoral vein obstruction.  *See table(s) above for measurements and observations. Electronically signed by Orlie Pollen on 02/05/2022 at 5:20:54 PM.    Final         Scheduled Meds:  enoxaparin (LOVENOX) injection  30 mg Subcutaneous Daily   multivitamin with minerals  1 tablet Oral Daily   Continuous Infusions:  cefTRIAXone (ROCEPHIN)  IV 2 g (02/06/22 1302)     LOS: 1 day       Phillips Climes, MD [facility]   To contact the attending provider between 7A-7P or the covering provider during after hours 7P-7A, please log into the web site [email or web address] and access using universal [place] password for that web site. If you do not have the password, please call the hospital operator.  02/06/2022, 3:02 PM

## 2022-02-06 NOTE — Assessment & Plan Note (Signed)
   incidental finding of atrial fibrillation on admission EKG   Unable to assess for associated symptoms as patient is currently nonverbal  Afib possibly secondary to underlying infection and uremia and may be self limiting.    Obtain TSH, Magnesium  ECG reveals no dynamic ST segment change to suggest ischemia.  Rate controlled, no AV nodal blocking agents necessary currently  Continuing to gently hydrate, treating with antibiotics  Monitoring on telemetry  Will obtain Echo in AM  Patient is at significant fall risk so I don't believe that anticoagulation would be prudent at this time

## 2022-02-06 NOTE — Progress Notes (Signed)
Initial Nutrition Assessment  DOCUMENTATION CODES:   Underweight, Severe malnutrition in context of chronic illness  INTERVENTION:   Advance diet as appropriate RD to order ONS as diet is advanced. Continue Multivitamin w/ minerals daily  NUTRITION DIAGNOSIS:   Severe Malnutrition related to chronic illness (Dementia) as evidenced by severe muscle depletion, severe fat depletion.  GOAL:   Patient will meet greater than or equal to 90% of their needs  MONITOR:   Diet advancement, Labs, Weight trends  REASON FOR ASSESSMENT:   Consult Assessment of nutrition requirement/status, Poor PO  ASSESSMENT:   88 y.o. male presented from SNF due to confusion and agitation. PMH includes dementia, GERD, and IBS. Pt admitted with acute metabolic encephalopathy and AKI.   Pt did not wake to RD voice or touch. Unable to obtain nutrition related history. No weights within past year to assess weight loss.   Spoke with RN outside of room. RN reports that pt is not awake and alert enough to eat and she reached out to MD to make NPO.  RD to continue to follow for diet advancement and order supplements when able.   Medications reviewed and include: MVI, IV antibiotics  Labs reviewed: BUN 46, Creatinine 1.30   NUTRITION - FOCUSED PHYSICAL EXAM:  Flowsheet Row Most Recent Value  Orbital Region Severe depletion  Upper Arm Region Severe depletion  Thoracic and Lumbar Region Severe depletion  Buccal Region Severe depletion  Temple Region Severe depletion  Clavicle Bone Region Severe depletion  Clavicle and Acromion Bone Region Severe depletion  Scapular Bone Region Severe depletion  Dorsal Hand Unable to assess  Patellar Region Severe depletion  Anterior Thigh Region Severe depletion  Posterior Calf Region Severe depletion  Edema (RD Assessment) None  Hair Reviewed  Eyes Unable to assess  Mouth Unable to assess  Skin Reviewed  Nails Unable to assess   Diet Order:   Diet Order      None       EDUCATION NEEDS:   Not appropriate for education at this time  Skin:  Skin Assessment: Reviewed RN Assessment  Last BM:  PTA  Height:   Ht Readings from Last 1 Encounters:  02/06/22 6' (1.829 m)    Weight:   Wt Readings from Last 1 Encounters:  02/06/22 59.2 kg    Ideal Body Weight:  80.9 kg  BMI:  Body mass index is 17.7 kg/m.  Estimated Nutritional Needs:   Kcal:  1800-2000  Protein:  90-105 grams  Fluid:  >/= 1.8 L    Hermina Barters RD, LDN Clinical Dietitian See [facility] for contact information.

## 2022-02-06 NOTE — Assessment & Plan Note (Signed)
   Notable prolonged QT on admission EKG  Reviewing electrolytes including potassium and magnesium levels and will replace as necessary   Monitoring patient on telemetry   Avoiding QT prolonging agents   Repeat EKG in the morning

## 2022-02-06 NOTE — Progress Notes (Signed)
Notified that patient appeared to be in complete heart block. HR is normal and BP stable. Telemetry was reviewed. Appreciate cardiology fellow reviewing strip with me.   Appears to be SR converting to atrial fibrillation with PVCs and pauses. He is not on any AV-blocking agents. Plan continue cardiac monitoring and monitor electrolytes.

## 2022-02-06 NOTE — Progress Notes (Signed)
CCMD called to notify of complete HB. V/signs stable and no change in his condition. EKG is being done per protocol. Notified Dr. Myna Hidalgo via page. Will continue to monitor.

## 2022-02-06 NOTE — Progress Notes (Signed)
EKG completed. New order for Echo from Dr. Myna Hidalgo.

## 2022-02-07 ENCOUNTER — Encounter (HOSPITAL_COMMUNITY): Payer: Self-pay | Admitting: Internal Medicine

## 2022-02-07 ENCOUNTER — Other Ambulatory Visit: Payer: Self-pay

## 2022-02-07 DIAGNOSIS — E43 Unspecified severe protein-calorie malnutrition: Secondary | ICD-10-CM | POA: Insufficient documentation

## 2022-02-07 DIAGNOSIS — L03115 Cellulitis of right lower limb: Secondary | ICD-10-CM

## 2022-02-07 DIAGNOSIS — G9341 Metabolic encephalopathy: Secondary | ICD-10-CM | POA: Diagnosis not present

## 2022-02-07 DIAGNOSIS — N179 Acute kidney failure, unspecified: Secondary | ICD-10-CM | POA: Diagnosis not present

## 2022-02-07 LAB — CBC
HCT: 37.2 % — ABNORMAL LOW (ref 39.0–52.0)
Hemoglobin: 12.1 g/dL — ABNORMAL LOW (ref 13.0–17.0)
MCH: 30 pg (ref 26.0–34.0)
MCHC: 32.5 g/dL (ref 30.0–36.0)
MCV: 92.1 fL (ref 80.0–100.0)
Platelets: 121 10*3/uL — ABNORMAL LOW (ref 150–400)
RBC: 4.04 MIL/uL — ABNORMAL LOW (ref 4.22–5.81)
RDW: 14.2 % (ref 11.5–15.5)
WBC: 13.5 10*3/uL — ABNORMAL HIGH (ref 4.0–10.5)
nRBC: 0 % (ref 0.0–0.2)

## 2022-02-07 LAB — BASIC METABOLIC PANEL
Anion gap: 9 (ref 5–15)
BUN: 43 mg/dL — ABNORMAL HIGH (ref 8–23)
CO2: 27 mmol/L (ref 22–32)
Calcium: 8.9 mg/dL (ref 8.9–10.3)
Chloride: 107 mmol/L (ref 98–111)
Creatinine, Ser: 1.2 mg/dL (ref 0.61–1.24)
GFR, Estimated: 58 mL/min — ABNORMAL LOW (ref >60)
Glucose, Bld: 76 mg/dL (ref 70–99)
Potassium: 4.1 mmol/L (ref 3.5–5.1)
Sodium: 143 mmol/L (ref 135–145)

## 2022-02-07 LAB — PHOSPHORUS: Phosphorus: 3.6 mg/dL (ref 2.5–4.6)

## 2022-02-07 LAB — MAGNESIUM: Magnesium: 2 mg/dL (ref 1.7–2.4)

## 2022-02-07 MED ORDER — ORAL CARE MOUTH RINSE
15.0000 mL | Freq: Two times a day (BID) | OROMUCOSAL | Status: DC
  Administered 2022-02-07 – 2022-02-10 (×6): 15 mL via OROMUCOSAL

## 2022-02-07 MED ORDER — ENOXAPARIN SODIUM 40 MG/0.4ML IJ SOSY
40.0000 mg | PREFILLED_SYRINGE | Freq: Every day | INTRAMUSCULAR | Status: DC
  Administered 2022-02-08 – 2022-02-10 (×3): 40 mg via SUBCUTANEOUS
  Filled 2022-02-07 (×3): qty 0.4

## 2022-02-07 MED ORDER — CHLORHEXIDINE GLUCONATE 0.12 % MT SOLN
15.0000 mL | Freq: Two times a day (BID) | OROMUCOSAL | Status: DC
  Administered 2022-02-07 – 2022-02-10 (×7): 15 mL via OROMUCOSAL
  Filled 2022-02-07 (×6): qty 15

## 2022-02-07 NOTE — Progress Notes (Signed)
PROGRESS NOTE    [name] [name]  GBT:[id number] DOB: 28-Jan-1931 DOA: 02/04/2022 PCP: Orvis Brill, [facility]   Chief Complaint  Patient presents with   Altered Mental Status    Brief Narrative:   88 year old male with past medical history of dementia, hyperlipidemia, gastroesophageal reflux disease presenting to [facility] emergency department from [facility] with confusion and bouts of agitation, significantly different than baseline.   Patient is a poor historian due to known history of dementia.     Assisted-living facility staff notes that since approximately the past 2 days the patient has not been acting at baseline.  Patient has been exhibiting increased lethargy and confusion with bouts of significant agitation.  Staff had also noticed that the patient was beginning to exhibit twitching of the extremities.  Staff denies any recent observation of fevers, vomiting diarrhea or complaints of pain.   Of note, patient assisted living facility also reports the patient fell several days ago, he does have significant right forehead bruise.   Due to patient's progressively worsening confusion EMS was contacted who promptly came to evaluate the patient and brought him into [facility] emergency department for evaluation.   Upon evaluation in the emergency department initial work-up was seemingly unremarkable with unremarkable urinalysis, unremarkable noncontrast CT imaging of the head and unremarkable chest x-ray.  Patient has been found to be suffering from acute kidney injury with BUN of 65 and creatinine of 1.88 and has been initiated on 1 L lactated Ringer's bolus.  Due to ongoing lethargy and confusion the hospitalist group has been contacted to assess the patient for admission to the hospital.  Assessment & Plan:   Principal Problem:   Acute metabolic encephalopathy Active Problems:   AKI (acute kidney injury) ([place])   Lactic acidosis   Cellulitis of  right lower extremity   Mixed hyperlipidemia   Myoclonic jerking   Atrial fibrillation (HCC)   GERD without esophagitis   Dementia without behavioral disturbance (HCC)   Prolonged QT interval   Protein-calorie malnutrition, severe   Acute metabolic encephalopathy -With underlying baseline dementia. -Acute encephalopathy secondary to dehydration, uremia, AKI and cellulitis. -CT head with no acute findings. -TSH mildly elevated at 5, -B12 within normal limit -Ammonia normal limit -Mentation has been gradually improving he is more awake today is more interactive and communicative, will start him on a diet.  AKI (acute kidney injury) ([place]) -Volume depletion and dehydration, continue to hold nephrotoxic medications, and continue with IV fluids  Cellulitis of right lower extremity - Notable redness induration and warmth of the right lower extremity on exam - Venous Dopplers negative for DVT - follow on Blood cultures - Continue with IV fluids    Atrial fibrillation (HCC) - incidental finding of atrial fibrillation on admission EKG  - Unable to assess for associated symptoms as patient is currently nonverbal - Afib possibly secondary to underlying infection and uremia and may be self limiting.   -TSH mildly elevated at 5, no evidence of hyperthyroidism -Heart rate is controlled -Poor candidate for anticoagulation due to his advanced dementia, and high risk for fall   Myoclonic jerking - ER provider has discussed witnessed jerking with Dr. Curly Shores with neurology  believes patient may be suffering from uremia as the cause.  - Dr. Curly Shores recommends intravenous volume resuscitation, resolving the acute kidney injury and uremia and reassessing.  - She further recommends that if patient continues to exhibit jerking once acute kidney injury/uremia has resolved and to consider an EEG  and obtaining formal neurology consultation.   Mixed hyperlipidemia - Continuing home regimen of lipid  lowering therapy.     GERD without esophagitis - Continuing home regimen of daily PPI therapy.     Dementia without behavioral disturbance (HCC) - Presence of advanced dementia complicates evaluation of this patient presenting with acute metabolic encephalopathy - Minimizing uncomfortable stimuli - Minimizing mood altering and sedating agents - Frequent redirection by staff - Fall precautions     Prolonged QT interval - Avoiding QT prolonging agents        DVT prophylaxis: Lovenox Code Status: DNR Family Communication: none at bedside Disposition:   Status is: Inpatient Remains altered, needed IV fluids, remains n.p.o.   Consultants:  None  Subjective:  No significant events overnight as discussed with staff, patient unable to provide any reliable complaint, but he is clearly more awake, appropriate and communicative today.  Objective: Vitals:   02/06/22 1927 02/07/22 0000 02/07/22 0400 02/07/22 0800  BP: (!) 139/92 123/86 132/88 120/79  Pulse: 87 79 73 77  Resp: '14 16 12 18  '$ Temp: 97.9 F (36.6 C) 98 F (36.7 C) 97.8 F (36.6 C)   TempSrc: Oral Oral Oral   SpO2: 99% 98% 97% 99%  Weight:      Height:        Intake/Output Summary (Last 24 hours) at 02/07/2022 1509 Last data filed at 02/07/2022 0700 Gross per 24 hour  Intake 1237.21 ml  Output 650 ml  Net 587.21 ml   Filed Weights   02/06/22 1423  Weight: 59.2 kg    Examination:  He is more awake and appropriate today, he appears with significant dementia, interactive, pleasant Symmetrical Chest wall movement, Good air movement bilaterally, CTAB RRR,No Gallops,Rubs or new Murmurs, No Parasternal Heave +ve B.Sounds, Abd Soft, No tenderness, No rebound - guarding or rigidity. No Cyanosis, Clubbing, he remains with significant right lower extremity edema and erythema, but it is significantly improved    Data Reviewed: I have personally reviewed following labs and imaging studies  CBC: Recent Labs   Lab 02/04/22 2050 02/04/22 2248 02/05/22 0106 02/05/22 0653 02/07/22 0149  WBC 12.6*  --   --  12.6* 13.5*  NEUTROABS 4.8  --   --  11.8*  --   HGB 10.8* 11.2* 10.9* 10.6* 12.1*  HCT 32.8* 33.0* 32.0* 33.0* 37.2*  MCV 92.9  --   --  92.7 92.1  PLT 121*  --   --  136* 121*    Basic Metabolic Panel: Recent Labs  Lab 02/04/22 2050 02/04/22 2248 02/05/22 0106 02/05/22 0900 02/06/22 0222 02/07/22 0149  NA 142 140 140 143 144 143  K 3.6 3.5 3.6 3.8 4.2 4.1  CL 104  --   --  109 108 107  CO2 30  --   --  '28 27 27  '$ GLUCOSE 115*  --   --  89 75 76  BUN 65*  --   --  54* 46* 43*  CREATININE 1.88*  --   --  1.51* 1.30* 1.20  CALCIUM 8.8*  --   --  8.7* 8.7* 8.9  MG 2.0  --   --  2.0 1.8 2.0  PHOS  --   --   --   --   --  3.6    GFR: Estimated Creatinine Clearance: 35.6 mL/min (by C-G formula based on SCr of 1.2 mg/dL).  Liver Function Tests: Recent Labs  Lab 02/04/22 2050 02/05/22 0900 02/06/22 0222  AST 29  25 25  ALT '23 19 22  '$ ALKPHOS 67 63 65  BILITOT 0.2* 0.3 0.4  PROT 6.2* 5.6* 5.7*  ALBUMIN 3.0* 2.7* 2.4*    CBG: No results for input(s): GLUCAP in the last 168 hours.   Recent Results (from the past 240 hour(s))  SARS Coronavirus 2 by RT PCR (hospital order, performed in [facility] hospital lab) *cepheid single result test* Anterior Nasal Swab     Status: None   Collection Time: 02/05/22  5:40 AM   Specimen: Anterior Nasal Swab  Result Value Ref Range Status   SARS Coronavirus 2 by RT PCR NEGATIVE NEGATIVE Final    Comment: (NOTE) SARS-CoV-2 target nucleic acids are NOT DETECTED.  The SARS-CoV-2 RNA is generally detectable in upper and lower respiratory specimens during the acute phase of infection. The lowest concentration of SARS-CoV-2 viral copies this assay can detect is 250 copies / mL. A negative result does not preclude SARS-CoV-2 infection and should not be used as the sole basis for treatment or other patient management decisions.  A negative  result may occur with improper specimen collection / handling, submission of specimen other than nasopharyngeal swab, presence of viral mutation(s) within the areas targeted by this assay, and inadequate number of viral copies (<250 copies / mL). A negative result must be combined with clinical observations, patient history, and epidemiological information.  Fact Sheet for Patients:   https://www.patel.info/  Fact Sheet for Healthcare Providers: https://hall.com/  This test is not yet approved or  cleared by the [place] FDA and has been authorized for detection and/or diagnosis of SARS-CoV-2 by FDA under an Emergency Use Authorization (EUA).  This EUA will remain in effect (meaning this test can be used) for the duration of the COVID-19 declaration under Section 564(b)(1) of the Act, 21 U.S.C. section 360bbb-3(b)(1), unless the authorization is terminated or revoked sooner.  Performed at [facility], [place] [address], [place], [place] [address]   Culture, blood (Routine X 2) w Reflex to ID Panel     Status: None (Preliminary result)   Collection Time: 02/05/22 10:54 AM   Specimen: BLOOD  Result Value Ref Range Status   Specimen Description BLOOD RIGHT ANTECUBITAL  Final   Special Requests   Final    BOTTLES DRAWN AEROBIC AND ANAEROBIC Blood Culture adequate volume   Culture   Final    NO GROWTH 2 DAYS Performed at [facility], [place] [address] [place], [place] [address]    Report Status PENDING  Incomplete  Culture, blood (Routine X 2) w Reflex to ID Panel     Status: None (Preliminary result)   Collection Time: 02/05/22 10:59 AM   Specimen: BLOOD  Result Value Ref Range Status   Specimen Description BLOOD LEFT ANTECUBITAL  Final   Special Requests   Final    BOTTLES DRAWN AEROBIC AND ANAEROBIC Blood Culture adequate volume   Culture   Final    NO GROWTH 2 DAYS Performed at [facility], [place] [address] [place], [place] [address]    Report Status PENDING  Incomplete         Radiology Studies: ECHOCARDIOGRAM COMPLETE  Result Date: 02/06/2022    ECHOCARDIOGRAM REPORT   Patient Name:   [place] Date of Exam: 02/06/2022 Medical Rec #:  [id number]           Height:       72.0 in Accession #:    [id number]  Weight:       150.0 lb Date of Birth:  1931/09/04            BSA:          1.885 m Patient Age:    36 years            BP:           122/72 mmHg Patient Gender: M                   HR:           85 bpm. Exam Location:  Inpatient Procedure: 2D Echo, Cardiac Doppler and Color Doppler Indications:    Atrial Fibrillation  History:        Patient has no prior history of Echocardiogram examinations.                 Arrythmias:Atrial Fibrillation.  Sonographer:    Jefferey Pica Referring Phys: [id number] [place]  1. Globaly hypokinesis mildly worse in the inferior, inferolateral segements from base-apex. Left ventricular ejection fraction, by estimation, is 30 to 35%. The left ventricle has moderately decreased function. The left ventricular internal cavity size  was mildly dilated. Left ventricular diastolic function could not be evaluated.  2. Right ventricular systolic function is mildly reduced. The right ventricular size is normal. There is mildly elevated pulmonary artery systolic pressure.  3. Left atrial size was mildly dilated.  4. Right atrial size was mildly dilated.  5. The mitral valve is degenerative. Mild to moderate mitral valve regurgitation.  6. There is mild calcification of the aortic valve. Aortic valve regurgitation is mild to moderate. Aortic valve sclerosis/calcification is present, without any evidence of aortic stenosis.  7. Aortic ascending aortic aneurysm 4.5 cm.  8. The inferior vena cava is dilated in size with >50% respiratory variability, suggesting right atrial pressure of 8 mmHg. Comparison(s): No prior Echocardiogram. FINDINGS  Left Ventricle:  Globaly hypokinesis mildly worse in the inferior, inferolateral segements from base-apex. Left ventricular ejection fraction, by estimation, is 30 to 35%. The left ventricle has moderately decreased function. The left ventricular internal cavity size was mildly dilated. There is borderline left ventricular hypertrophy. Left ventricular diastolic function could not be evaluated. Right Ventricle: The right ventricular size is normal. Right ventricular systolic function is mildly reduced. There is mildly elevated pulmonary artery systolic pressure. The tricuspid regurgitant velocity is 2.53 m/s, and with an assumed right atrial pressure of 15 mmHg, the estimated right ventricular systolic pressure is 55.7 mmHg. Left Atrium: Left atrial size was mildly dilated. Right Atrium: Right atrial size was mildly dilated. Pericardium: There is no evidence of pericardial effusion. Mitral Valve: The mitral valve is degenerative in appearance. Mild to moderate mitral valve regurgitation. Tricuspid Valve: The tricuspid valve is normal in structure. Tricuspid valve regurgitation is trivial. Aortic Valve: There is mild calcification of the aortic valve. Aortic valve regurgitation is mild to moderate. Aortic valve sclerosis/calcification is present, without any evidence of aortic stenosis. Aortic valve peak gradient measures 3.7 mmHg. Pulmonic Valve: The pulmonic valve was not well visualized. Pulmonic valve regurgitation is not visualized. Aorta: Ascending aortic aneurysm 4.5 cm. Venous: The inferior vena cava is dilated in size with greater than 50% respiratory variability, suggesting right atrial pressure of 8 mmHg. IAS/Shunts: No atrial level shunt detected by color flow Doppler.  LEFT VENTRICLE PLAX 2D LVIDd:         5.80 cm LVIDs:  4.60 cm LV PW:         1.00 cm LV IVS:        1.10 cm LVOT diam:     2.10 cm LV SV:         74 LV SV Index:   39 LVOT Area:     3.46 cm  LV Volumes (MOD) LV vol d, MOD A4C: 130.0 ml LV vol s,  MOD A4C: 101.0 ml LV SV MOD A4C:     130.0 ml RIGHT VENTRICLE          IVC RV Basal diam:  3.00 cm  IVC diam: 2.10 cm LEFT ATRIUM             Index        RIGHT ATRIUM           Index LA diam:        3.60 cm 1.91 cm/m   RA Area:     19.40 cm LA Vol (A2C):   63.7 ml 33.79 ml/m  RA Volume:   61.10 ml  32.41 ml/m LA Vol (A4C):   46.0 ml 24.40 ml/m LA Biplane Vol: 56.2 ml 29.81 ml/m  AORTIC VALVE AV Area (Vmax): 2.85 cm AV Vmax:        95.53 cm/s AV Peak Grad:   3.7 mmHg LVOT Vmax:      78.65 cm/s LVOT Vmean:     51.825 cm/s LVOT VTI:       0.213 m  AORTA Ao Root diam: 5.00 cm Ao Asc diam:  4.45 cm MR Peak grad: 86.5 mmHg   TRICUSPID VALVE MR Vmax:      465.00 cm/s TR Peak grad:   25.6 mmHg                           TR Vmax:        253.00 cm/s                            SHUNTS                           Systemic VTI:  0.21 m                           Systemic Diam: 2.10 cm Phineas Inches Electronically signed by Phineas Inches Signature Date/Time: 02/06/2022/9:33:31 AM    Final         Scheduled Meds:  chlorhexidine  15 mL Mouth Rinse BID   [START ON 02/08/2022] enoxaparin (LOVENOX) injection  40 mg Subcutaneous Daily   mouth rinse  15 mL Mouth Rinse q12n4p   multivitamin with minerals  1 tablet Oral Daily   Continuous Infusions:  cefTRIAXone (ROCEPHIN)  IV 2 g (02/07/22 1013)   lactated ringers 75 mL/hr at 02/07/22 0700     LOS: 2 days       Phillips Climes, MD [facility]   To contact the attending provider between 7A-7P or the covering provider during after hours 7P-7A, please log into the web site [email or web address] and access using universal [place] password for that web site. If you do not have the password, please call the hospital operator.  02/07/2022, 3:09 PM

## 2022-02-08 DIAGNOSIS — G9341 Metabolic encephalopathy: Secondary | ICD-10-CM | POA: Diagnosis not present

## 2022-02-08 DIAGNOSIS — L03115 Cellulitis of right lower limb: Secondary | ICD-10-CM | POA: Diagnosis not present

## 2022-02-08 DIAGNOSIS — N179 Acute kidney failure, unspecified: Secondary | ICD-10-CM | POA: Diagnosis not present

## 2022-02-08 LAB — BASIC METABOLIC PANEL
Anion gap: 8 (ref 5–15)
BUN: 43 mg/dL — ABNORMAL HIGH (ref 8–23)
CO2: 28 mmol/L (ref 22–32)
Calcium: 8.7 mg/dL — ABNORMAL LOW (ref 8.9–10.3)
Chloride: 108 mmol/L (ref 98–111)
Creatinine, Ser: 1.31 mg/dL — ABNORMAL HIGH (ref 0.61–1.24)
GFR, Estimated: 52 mL/min — ABNORMAL LOW (ref >60)
Glucose, Bld: 91 mg/dL (ref 70–99)
Potassium: 3.8 mmol/L (ref 3.5–5.1)
Sodium: 144 mmol/L (ref 135–145)

## 2022-02-08 LAB — CBC
HCT: 34.5 % — ABNORMAL LOW (ref 39.0–52.0)
Hemoglobin: 11.4 g/dL — ABNORMAL LOW (ref 13.0–17.0)
MCH: 30.2 pg (ref 26.0–34.0)
MCHC: 33 g/dL (ref 30.0–36.0)
MCV: 91.5 fL (ref 80.0–100.0)
Platelets: 132 10*3/uL — ABNORMAL LOW (ref 150–400)
RBC: 3.77 MIL/uL — ABNORMAL LOW (ref 4.22–5.81)
RDW: 14 % (ref 11.5–15.5)
WBC: 9.4 10*3/uL (ref 4.0–10.5)
nRBC: 0 % (ref 0.0–0.2)

## 2022-02-08 LAB — PHOSPHORUS: Phosphorus: 3 mg/dL (ref 2.5–4.6)

## 2022-02-08 MED ORDER — K PHOS MONO-SOD PHOS DI & MONO 155-852-130 MG PO TABS
250.0000 mg | ORAL_TABLET | Freq: Two times a day (BID) | ORAL | Status: AC
Start: 2022-02-08 — End: 2022-02-09
  Administered 2022-02-08 – 2022-02-09 (×4): 250 mg via ORAL
  Filled 2022-02-08 (×4): qty 1

## 2022-02-08 MED ORDER — ENSURE ENLIVE PO LIQD
237.0000 mL | Freq: Three times a day (TID) | ORAL | Status: DC
  Administered 2022-02-08 – 2022-02-10 (×6): 237 mL via ORAL
  Filled 2022-02-08: qty 237

## 2022-02-08 NOTE — Progress Notes (Signed)
PROGRESS NOTE    [name] [name]  ERX:[id number] DOB: [birth year] DOA: 02/04/2022 PCP: [name] [name], [name] [name]   Chief Complaint  Patient presents with   Altered Mental Status    Brief Narrative:   88 year old male with past medical history of dementia, hyperlipidemia, gastroesophageal reflux disease presenting to [facility] emergency department from [facility] with confusion and bouts of agitation, significantly different than baseline.   Patient is a poor historian due to known history of dementia.     Assisted-living facility staff notes that since approximately the past 2 days the patient has not been acting at baseline.  Patient has been exhibiting increased lethargy and confusion with bouts of significant agitation.  Staff had also noticed that the patient was beginning to exhibit twitching of the extremities.  Staff denies any recent observation of fevers, vomiting diarrhea or complaints of pain.   Of note, patient assisted living facility also reports the patient fell several days ago, he does have significant right forehead bruise.   Due to patient's progressively worsening confusion EMS was contacted who promptly came to evaluate the patient and brought him into [facility] emergency department for evaluation.   Upon evaluation in the emergency department initial work-up was seemingly unremarkable with unremarkable urinalysis, unremarkable noncontrast CT imaging of the head and unremarkable chest x-ray.  Patient has been found to be suffering from acute kidney injury with BUN of 65 and creatinine of 1.88 and has been initiated on 1 L lactated Ringer's bolus.  Due to ongoing lethargy and confusion the hospitalist group has been contacted to assess the patient for admission to the hospital.  Assessment & Plan:   Principal Problem:   Acute metabolic encephalopathy Active Problems:   AKI (acute kidney injury) ([place])   Lactic acidosis   Cellulitis of  right lower extremity   Mixed hyperlipidemia   Myoclonic jerking   Atrial fibrillation (HCC)   GERD without esophagitis   Dementia without behavioral disturbance (HCC)   Prolonged QT interval   Protein-calorie malnutrition, severe   Acute metabolic encephalopathy -With underlying baseline dementia. -Acute encephalopathy secondary to dehydration, uremia, AKI and cellulitis. -CT head with no acute findings. -TSH mildly elevated at 5, -B12 within normal limit -Ammonia normal limit -Patient has improved, he is resumed on his diet, oral intake remains unreliable, will add Ensure, continue with IV fluids.    AKI (acute kidney injury) ([place]) -Volume depletion and dehydration, continue to hold nephrotoxic medications, and continue with IV fluids  Cellulitis of right lower extremity - Notable redness induration and warmth of the right lower extremity on exam - Venous Dopplers negative for DVT - follow on Blood cultures - Continue with IV fluids    Atrial fibrillation (HCC) - incidental finding of atrial fibrillation on admission EKG  - Unable to assess for associated symptoms as patient is currently nonverbal - Afib possibly secondary to underlying infection and uremia and may be self limiting.   -TSH mildly elevated at 5, no evidence of hyperthyroidism -Heart rate is controlled -Poor candidate for anticoagulation due to his advanced dementia, and high risk for fall   Myoclonic jerking - ER provider has discussed witnessed jerking with Dr. Curly Shores with neurology  believes patient may be suffering from uremia as the cause.  -This has resolved after kidney function and uremia has improved.   Mixed hyperlipidemia - Continuing home regimen of lipid lowering therapy.     GERD without esophagitis - Continuing home regimen of daily PPI therapy.  Dementia without behavioral disturbance (HCC) - Presence of advanced dementia complicates evaluation of this patient presenting with acute  metabolic encephalopathy - Minimizing uncomfortable stimuli - Minimizing mood altering and sedating agents - Frequent redirection by staff - Fall precautions     Prolonged QT interval - Avoiding QT prolonging agents        DVT prophylaxis: Lovenox Code Status: DNR Family Communication: none at bedside Disposition:   Status is: Inpatient  Consultants:  None  Subjective:  No significant events overnight as discussed with staff, patient is demented and unable to give any reliable complaints.  Objective: Vitals:   02/08/22 0321 02/08/22 0753 02/08/22 0800 02/08/22 1135  BP: (!) 144/94 (!) 151/43 (!) 146/81 135/84  Pulse: 81 71 72 77  Resp: '15 15 16 16  '$ Temp: 98.2 F (36.8 C) 97.7 F (36.5 C)  98.7 F (37.1 C)  TempSrc: Axillary Oral  Oral  SpO2: 97% 99% 100% 97%  Weight:      Height:        Intake/Output Summary (Last 24 hours) at 02/08/2022 1453 Last data filed at 02/08/2022 0400 Gross per 24 hour  Intake 1750.65 ml  Output --  Net 1750.65 ml   Filed Weights   02/06/22 1423  Weight: 59.2 kg    Examination:  Awake, significantly demented, answering some questions, but significantly impaired cognition and insight, frail, thin appearing, [name] ill-appearing.    Symmetrical Chest wall movement, Good air movement bilaterally, CTAB RRR,No Gallops,Rubs or new Murmurs, No Parasternal Heave +ve B.Sounds, Abd Soft, No tenderness, No rebound - guarding or rigidity. No Cyanosis, Clubbing , lower extremity erythema and swelling improving    Data Reviewed: I have personally reviewed following labs and imaging studies  CBC: Recent Labs  Lab 02/04/22 2050 02/04/22 2248 02/05/22 0106 02/05/22 0653 02/07/22 0149 02/08/22 0113  WBC 12.6*  --   --  12.6* 13.5* 9.4  NEUTROABS 4.8  --   --  11.8*  --   --   HGB 10.8* 11.2* 10.9* 10.6* 12.1* 11.4*  HCT 32.8* 33.0* 32.0* 33.0* 37.2* 34.5*  MCV 92.9  --   --  92.7 92.1 91.5  PLT 121*  --   --  136* 121* 132*     Basic Metabolic Panel: Recent Labs  Lab 02/04/22 2050 02/04/22 2248 02/05/22 0106 02/05/22 0900 02/06/22 0222 02/07/22 0149 02/08/22 0113  NA 142   < > 140 143 144 143 144  K 3.6   < > 3.6 3.8 4.2 4.1 3.8  CL 104  --   --  109 108 107 108  CO2 30  --   --  '28 27 27 28  '$ GLUCOSE 115*  --   --  89 75 76 91  BUN 65*  --   --  54* 46* 43* 43*  CREATININE 1.88*  --   --  1.51* 1.30* 1.20 1.31*  CALCIUM 8.8*  --   --  8.7* 8.7* 8.9 8.7*  MG 2.0  --   --  2.0 1.8 2.0  --   PHOS  --   --   --   --   --  3.6 3.0   < > = values in this interval not displayed.    GFR: Estimated Creatinine Clearance: 32.6 mL/min (A) (by C-G formula based on SCr of 1.31 mg/dL (H)).  Liver Function Tests: Recent Labs  Lab 02/04/22 2050 02/05/22 0900 02/06/22 0222  AST '29 25 25  '$ ALT '23 19 22  '$ ALKPHOS  67 63 65  BILITOT 0.2* 0.3 0.4  PROT 6.2* 5.6* 5.7*  ALBUMIN 3.0* 2.7* 2.4*    CBG: No results for input(s): GLUCAP in the last 168 hours.   Recent Results (from the past 240 hour(s))  SARS Coronavirus 2 by RT PCR (hospital order, performed in [facility] hospital lab) *cepheid single result test* Anterior Nasal Swab     Status: None   Collection Time: 02/05/22  5:40 AM   Specimen: Anterior Nasal Swab  Result Value Ref Range Status   SARS Coronavirus 2 by RT PCR NEGATIVE NEGATIVE Final    Comment: (NOTE) SARS-CoV-2 target nucleic acids are NOT DETECTED.  The SARS-CoV-2 RNA is generally detectable in upper and lower respiratory specimens during the acute phase of infection. The lowest concentration of SARS-CoV-2 viral copies this assay can detect is 250 copies / mL. A negative result does not preclude SARS-CoV-2 infection and should not be used as the sole basis for treatment or other patient management decisions.  A negative result may occur with improper specimen collection / handling, submission of specimen other than nasopharyngeal swab, presence of viral mutation(s) within the areas  targeted by this assay, and inadequate number of viral copies (<250 copies / mL). A negative result must be combined with clinical observations, patient history, and epidemiological information.  Fact Sheet for Patients:   https://www.patel.info/  Fact Sheet for Healthcare Providers: https://hall.com/  This test is not yet approved or  cleared by the [place] FDA and has been authorized for detection and/or diagnosis of SARS-CoV-2 by FDA under an Emergency Use Authorization (EUA).  This EUA will remain in effect (meaning this test can be used) for the duration of the COVID-19 declaration under Section 564(b)(1) of the Act, 21 U.S.C. section 360bbb-3(b)(1), unless the authorization is terminated or revoked sooner.  Performed at [facility], [place] [address], [place], [place] [address]   Culture, blood (Routine X 2) w Reflex to ID Panel     Status: None (Preliminary result)   Collection Time: 02/05/22 10:54 AM   Specimen: BLOOD  Result Value Ref Range Status   Specimen Description BLOOD RIGHT ANTECUBITAL  Final   Special Requests   Final    BOTTLES DRAWN AEROBIC AND ANAEROBIC Blood Culture adequate volume   Culture   Final    NO GROWTH 3 DAYS Performed at [facility], [place] [address], [place], [place] [address]    Report Status PENDING  Incomplete  Culture, blood (Routine X 2) w Reflex to ID Panel     Status: None (Preliminary result)   Collection Time: 02/05/22 10:59 AM   Specimen: BLOOD  Result Value Ref Range Status   Specimen Description BLOOD LEFT ANTECUBITAL  Final   Special Requests   Final    BOTTLES DRAWN AEROBIC AND ANAEROBIC Blood Culture adequate volume   Culture   Final    NO GROWTH 3 DAYS Performed at [facility], [place] [address] [place], [place] [address]    Report Status PENDING  Incomplete         Radiology Studies: No results found.      Scheduled Meds:  chlorhexidine  15 mL  Mouth Rinse BID   enoxaparin (LOVENOX) injection  40 mg Subcutaneous Daily   feeding supplement  237 mL Oral TID BM   mouth rinse  15 mL Mouth Rinse q12n4p   multivitamin with minerals  1 tablet Oral Daily   phosphorus  250 mg Oral BID  Continuous Infusions:  cefTRIAXone (ROCEPHIN)  IV 2 g (02/08/22 1018)   lactated ringers 75 mL/hr at 02/08/22 0400     LOS: 3 days       Phillips Climes, MD [facility]   To contact the attending provider between 7A-7P or the covering provider during after hours 7P-7A, please log into the web site [email or web address] and access using universal  password for that web site. If you do not have the password, please call the hospital operator.  02/08/2022, 2:53 PM

## 2022-02-09 ENCOUNTER — Other Ambulatory Visit: Payer: Self-pay

## 2022-02-09 DIAGNOSIS — Z7189 Other specified counseling: Secondary | ICD-10-CM

## 2022-02-09 DIAGNOSIS — N179 Acute kidney failure, unspecified: Secondary | ICD-10-CM | POA: Diagnosis not present

## 2022-02-09 DIAGNOSIS — E870 Hyperosmolality and hypernatremia: Secondary | ICD-10-CM | POA: Diagnosis not present

## 2022-02-09 DIAGNOSIS — I4891 Unspecified atrial fibrillation: Secondary | ICD-10-CM | POA: Diagnosis not present

## 2022-02-09 DIAGNOSIS — L03115 Cellulitis of right lower limb: Secondary | ICD-10-CM | POA: Diagnosis not present

## 2022-02-09 DIAGNOSIS — E43 Unspecified severe protein-calorie malnutrition: Secondary | ICD-10-CM

## 2022-02-09 DIAGNOSIS — G9341 Metabolic encephalopathy: Secondary | ICD-10-CM | POA: Diagnosis not present

## 2022-02-09 LAB — CBC
HCT: 33.7 % — ABNORMAL LOW (ref 39.0–52.0)
Hemoglobin: 11.1 g/dL — ABNORMAL LOW (ref 13.0–17.0)
MCH: 30.5 pg (ref 26.0–34.0)
MCHC: 32.9 g/dL (ref 30.0–36.0)
MCV: 92.6 fL (ref 80.0–100.0)
Platelets: 123 10*3/uL — ABNORMAL LOW (ref 150–400)
RBC: 3.64 MIL/uL — ABNORMAL LOW (ref 4.22–5.81)
RDW: 13.9 % (ref 11.5–15.5)
WBC: 5.5 10*3/uL (ref 4.0–10.5)
nRBC: 0 % (ref 0.0–0.2)

## 2022-02-09 LAB — BASIC METABOLIC PANEL
Anion gap: 8 (ref 5–15)
BUN: 35 mg/dL — ABNORMAL HIGH (ref 8–23)
CO2: 27 mmol/L (ref 22–32)
Calcium: 8.6 mg/dL — ABNORMAL LOW (ref 8.9–10.3)
Chloride: 111 mmol/L (ref 98–111)
Creatinine, Ser: 1.1 mg/dL (ref 0.61–1.24)
GFR, Estimated: >60 mL/min (ref >60)
Glucose, Bld: 91 mg/dL (ref 70–99)
Potassium: 3.7 mmol/L (ref 3.5–5.1)
Sodium: 146 mmol/L — ABNORMAL HIGH (ref 135–145)

## 2022-02-09 LAB — PHOSPHORUS: Phosphorus: 3.6 mg/dL (ref 2.5–4.6)

## 2022-02-09 MED ORDER — SODIUM CHLORIDE 0.45 % IV SOLN: INTRAVENOUS | Status: DC

## 2022-02-09 NOTE — Progress Notes (Signed)
CSW received call from [facility]) stating that they received a call from [facility] stating that patient's POA has requested they follow patient once he returns to [facility]. CSW will update them once medically stable; they will need a hospice order and they can order any needed DME.    Gilmore Laroche, MSW, [facility]

## 2022-02-09 NOTE — Consult Note (Signed)
Consultation Note Date: 02/09/2022   Patient Name: [name] [name]  DOB: 11-Apr-1931  MRN: [id number]  Age / Sex: 88 y.o., male  PCP: [facility] Referring Physician: Elgergawy, Silver Huguenin, MD  Reason for Consultation: Establishing goals of care, Hospice Evaluation, and Terminal Care  HPI/Patient Profile: 88 y.o. male  with past medical history of dementia, hyperlipidemia, gastroesophageal reflux disease  admitted on 02/04/2022 with confusion and bouts of agitation, significantly different than baseline.   PMT has been consulted to assist with goals of care conversation and further discussion of hospice.  Clinical Assessment and Goals of Care:  I have reviewed medical records including EPIC notes, labs and imaging, assessed the patient and then patient's POA/[name] [name] to discuss diagnosis prognosis, GOC, EOL wishes, disposition and options.  I introduced Palliative Medicine as specialized medical care for people living with serious illness. It focuses on providing relief from the symptoms and stress of a serious illness. The goal is to improve quality of life for both the patient and the family.  We discussed a brief life review of the patient and then focused on their current illness.   Medical History Review and Understanding:  Discussed the progressive and terminal nature of dementia.  Advance Directives: A detailed discussion regarding advanced directives was had.  Patient's POA's are [name] [name] and [name] [name] [facility]).   Discussion: Discussed goals of care with patient's POA [name] [name], who confirms that at this time the goal is to ensure patient's comfort in his familiar environment of [employer]. We reviewed the different locations that hospice support can be provided and hospice philosophy. [name] [name] shares that [name] [name] is more familiar with this, as she works in [employer] and has had experience with  her father on hospice. We discussed that [name] may have a relationship with a certain hospice agency and counseled on the role of palliative care in providing support while patient is admitted. He is agreeable to continuing the conversation in the coming days once they have more information from [facility] about feasibility of patient's return with hospice. We discussed that patient will likely need residential hospice later on as his natural disease process continues without life-prolonging interventions.    The difference between aggressive medical intervention and comfort care was considered in light of the patient's goals of care. Hospice and Palliative Care services outpatient were explained and offered.   Discussed the importance of continued conversation with family and the medical providers regarding overall plan of care and treatment options, ensuring decisions are within the context of the patient's values and GOCs.   Questions and concerns were addressed. The family was encouraged to call with questions or concerns.  PMT will continue to support holistically.   SUMMARY OF RECOMMENDATIONS   -DNR -Continue current care for now -Patient's POAs [name] [name] and [name] [name] [facility]) plan to discuss addition of hospice support with [facility] this evening/tomorrow -PMT will continue to follow and assist as needed   Prognosis:  < 6 months  Discharge Planning:  ALF with hospice       Primary Diagnoses: Present on Admission:  Acute metabolic encephalopathy  AKI (acute kidney injury) ([place])  Lactic acidosis  Mixed hyperlipidemia  GERD without esophagitis  Dementia without behavioral disturbance ([place])  Myoclonic jerking  Cellulitis of right lower extremity  Atrial fibrillation (HCC)  Prolonged QT interval    Physical Exam Vitals and nursing note reviewed.  Constitutional:      General: He is awake. He is not in acute  distress. Cardiovascular:     Rate and  Rhythm: Normal rate. Rhythm irregularly irregular.  Pulmonary:     Effort: Pulmonary effort is normal.  Neurological:     Mental Status: He is alert. He is disoriented.    Vital Signs: BP 128/81 (BP Location: Left Arm)   Pulse 67   Temp 98.2 F (36.8 C) (Oral)   Resp 17   Ht 6' (1.829 m)   Wt 59.2 kg Comment: bedscale  SpO2 100%   BMI 17.70 kg/m  Pain Scale: PAINAD POSS *See Group Information*: 1-Acceptable,Awake and alert Pain Score: 0-No pain   SpO2: SpO2: 100 % O2 Device:SpO2: 100 % O2 Flow Rate: .    Palliative Assessment/Data:     Total time: I spent 40 minutes in the care of the patient today in the above activities and documenting the encounter.   [name] Cooler, PA-C Palliative Medicine Team Team phone # [phone]  Thank you for allowing the Palliative Medicine Team to assist in the care of this patient. Please utilize secure chat with additional questions, if there is no response within 30 minutes please call the above phone number.  Palliative Medicine Team providers are available by phone from 7am to 7pm daily and can be reached through the team cell phone.  Should this patient require assistance outside of these hours, please call the patient's attending physician.

## 2022-02-09 NOTE — Progress Notes (Signed)
PROGRESS NOTE    [name] [name]  GHW:[id number] DOB: [birth year] DOA: 02/04/2022 PCP: Orvis Brill, [facility]   Chief Complaint  Patient presents with   Altered Mental Status    Brief Narrative:   88 year old male with past medical history of dementia, hyperlipidemia, gastroesophageal reflux disease presenting to [facility] emergency department from [facility] with confusion and bouts of agitation, significantly different than baseline.   Patient is a poor historian due to known history of dementia.     Assisted-living facility staff notes that since approximately the past 2 days the patient has not been acting at baseline.  Patient has been exhibiting increased lethargy and confusion with bouts of significant agitation.  Staff had also noticed that the patient was beginning to exhibit twitching of the extremities.  Staff denies any recent observation of fevers, vomiting diarrhea or complaints of pain.   Of note, patient assisted living facility also reports the patient fell several days ago, he does have significant right forehead bruise.   Due to patient's progressively worsening confusion EMS was contacted who promptly came to evaluate the patient and brought him into [facility] emergency department for evaluation.   Upon evaluation in the emergency department initial work-up was seemingly unremarkable with unremarkable urinalysis, unremarkable noncontrast CT imaging of the head and unremarkable chest x-ray.  Patient has been found to be suffering from acute kidney injury with BUN of 65 and creatinine of 1.88 and has been initiated on 1 L lactated Ringer's bolus.  Due to ongoing lethargy and confusion the hospitalist group has been contacted to assess the patient for admission to the hospital.  Assessment & Plan:   Principal Problem:   Acute metabolic encephalopathy Active Problems:   AKI (acute kidney injury) ([place])   Lactic acidosis   Cellulitis of  right lower extremity   Mixed hyperlipidemia   Myoclonic jerking   Atrial fibrillation (HCC)   GERD without esophagitis   Dementia without behavioral disturbance (HCC)   Prolonged QT interval   Protein-calorie malnutrition, severe   Acute metabolic encephalopathy -With underlying baseline dementia. -Acute encephalopathy secondary to dehydration, uremia, AKI and cellulitis. -CT head with no acute findings. -TSH mildly elevated at 5, -B12 within normal limit -Ammonia normal limit -Remains significantly lethargic, with very poor oral intake  Hypernatremia -due to poor oral intake, will change his fluid to half-normal saline.  AKI (acute kidney injury) ([place]) -Volume depletion and dehydration, continue to hold nephrotoxic medications, and continue with IV fluids  Cellulitis of right lower extremity - Notable redness induration and warmth of the right lower extremity on exam - Venous Dopplers negative for DVT - follow on Blood cultures - Continue with IV fluids    Atrial fibrillation (HCC) - incidental finding of atrial fibrillation on admission EKG  - Unable to assess for associated symptoms as patient is currently nonverbal - Afib possibly secondary to underlying infection and uremia and may be self limiting.   -TSH mildly elevated at 5, no evidence of hyperthyroidism -Heart rate is controlled -Poor candidate for anticoagulation due to his advanced dementia, and high risk for fall   Myoclonic jerking - ER provider has discussed witnessed jerking with Dr. Curly Shores with neurology  believes patient may be suffering from uremia as the cause.  -This has resolved after kidney function and uremia has improved.   Mixed hyperlipidemia - Continuing home regimen of lipid lowering therapy.     GERD without esophagitis - Continuing home regimen of daily PPI therapy.  Dementia without behavioral disturbance (HCC) - Presence of advanced dementia complicates evaluation of this  patient presenting with acute metabolic encephalopathy - Minimizing uncomfortable stimuli - Minimizing mood altering and sedating agents - Frequent redirection by staff - Fall precautions     Prolonged QT interval - Avoiding QT prolonging agents      Goals of care -I have discussed with his POA Ms Lavone Neri at bedside, patient wishes were very clear for no life extending measures, his dementia has been very progressive, with significant worsening over last few month, where his appetite, has decreased significantly, as well his mental acuity, and speech has worsened, at this point is with very poor life quality, and currently his status is irreversible due to advanced dementia, I have discussed with IR, we think to go back to his facility under hospice care will be the best measure, she has already discussed with them and she reports [name] can take him back with hospice, will consult palliative medicine regarding further recommendation as well.  DVT prophylaxis: Lovenox Code Status: DNR Family Communication: discussed with POA Karen at bedside Disposition:   Status is: Inpatient  Consultants:  None  Subjective:  No significant events overnight as discussed with staff, patient is demented and unable to give any reliable complaints.  Remains with very poor oral intake and appetite  Objective: Vitals:   02/08/22 2300 02/09/22 0308 02/09/22 0750 02/09/22 1148  BP: 114/76 (!) 131/56 (!) 153/60 128/81  Pulse: 60 60 (!) 58 67  Resp: '14 16 16 17  '$ Temp: 97.6 F (36.4 C) 97.6 F (36.4 C) 97.7 F (36.5 C) 98.2 F (36.8 C)  TempSrc: Oral Oral Oral Oral  SpO2: 100% 92% 100% 100%  Weight:      Height:        Intake/Output Summary (Last 24 hours) at 02/09/2022 1339 Last data filed at 02/08/2022 1756 Gross per 24 hour  Intake --  Output 550 ml  Net -550 ml   Filed Weights   02/06/22 1423  Weight: 59.2 kg    Examination:  He is more sleepy today, less interactive, does not answer  any questions or follow any commands, significantly frail, cachectic, thin appearing.   Symmetrical Chest wall movement, Good air movement bilaterally, CTAB RRR,No Gallops,Rubs or new Murmurs, No Parasternal Heave +ve B.Sounds, Abd Soft, No tenderness, No rebound - guarding or rigidity. No Cyanosis, Clubbing , right lower extremity erythema and swelling still present    Data Reviewed: I have personally reviewed following labs and imaging studies  CBC: Recent Labs  Lab 02/04/22 2050 02/04/22 2248 02/05/22 0106 02/05/22 2706 02/07/22 0149 02/08/22 0113 02/09/22 0143  WBC 12.6*  --   --  12.6* 13.5* 9.4 5.5  NEUTROABS 4.8  --   --  11.8*  --   --   --   HGB 10.8*   < > 10.9* 10.6* 12.1* 11.4* 11.1*  HCT 32.8*   < > 32.0* 33.0* 37.2* 34.5* 33.7*  MCV 92.9  --   --  92.7 92.1 91.5 92.6  PLT 121*  --   --  136* 121* 132* 123*   < > = values in this interval not displayed.    Basic Metabolic Panel: Recent Labs  Lab 02/04/22 2050 02/04/22 2248 02/05/22 0900 02/06/22 0222 02/07/22 0149 02/08/22 0113 02/09/22 0143  NA 142   < > 143 144 143 144 146*  K 3.6   < > 3.8 4.2 4.1 3.8 3.7  CL 104  --  109  108 107 108 111  CO2 30  --  '28 27 27 28 27  '$ GLUCOSE 115*  --  89 75 76 91 91  BUN 65*  --  54* 46* 43* 43* 35*  CREATININE 1.88*  --  1.51* 1.30* 1.20 1.31* 1.10  CALCIUM 8.8*  --  8.7* 8.7* 8.9 8.7* 8.6*  MG 2.0  --  2.0 1.8 2.0  --   --   PHOS  --   --   --   --  3.6 3.0 3.6   < > = values in this interval not displayed.    GFR: Estimated Creatinine Clearance: 38.9 mL/min (by C-G formula based on SCr of 1.1 mg/dL).  Liver Function Tests: Recent Labs  Lab 02/04/22 2050 02/05/22 0900 02/06/22 0222  AST '29 25 25  '$ ALT '23 19 22  '$ ALKPHOS 67 63 65  BILITOT 0.2* 0.3 0.4  PROT 6.2* 5.6* 5.7*  ALBUMIN 3.0* 2.7* 2.4*    CBG: No results for input(s): GLUCAP in the last 168 hours.   Recent Results (from the past 240 hour(s))  SARS Coronavirus 2 by RT PCR (hospital  order, performed in [facility] hospital lab) *cepheid single result test* Anterior Nasal Swab     Status: None   Collection Time: 02/05/22  5:40 AM   Specimen: Anterior Nasal Swab  Result Value Ref Range Status   SARS Coronavirus 2 by RT PCR NEGATIVE NEGATIVE Final    Comment: (NOTE) SARS-CoV-2 target nucleic acids are NOT DETECTED.  The SARS-CoV-2 RNA is generally detectable in upper and lower respiratory specimens during the acute phase of infection. The lowest concentration of SARS-CoV-2 viral copies this assay can detect is 250 copies / mL. A negative result does not preclude SARS-CoV-2 infection and should not be used as the sole basis for treatment or other patient management decisions.  A negative result may occur with improper specimen collection / handling, submission of specimen other than nasopharyngeal swab, presence of viral mutation(s) within the areas targeted by this assay, and inadequate number of viral copies (<250 copies / mL). A negative result must be combined with clinical observations, patient history, and epidemiological information.  Fact Sheet for Patients:   https://www.patel.info/  Fact Sheet for Healthcare Providers: https://hall.com/  This test is not yet approved or  cleared by the [place] FDA and has been authorized for detection and/or diagnosis of SARS-CoV-2 by FDA under an Emergency Use Authorization (EUA).  This EUA will remain in effect (meaning this test can be used) for the duration of the COVID-19 declaration under Section 564(b)(1) of the Act, 21 U.S.C. section 360bbb-3(b)(1), unless the authorization is terminated or revoked sooner.  Performed at [facility], [place] [address] [place], [place] [address]   Culture, blood (Routine X 2) w Reflex to ID Panel     Status: None (Preliminary result)   Collection Time: 02/05/22 10:54 AM   Specimen: BLOOD  Result Value Ref Range Status    Specimen Description BLOOD RIGHT ANTECUBITAL  Final   Special Requests   Final    BOTTLES DRAWN AEROBIC AND ANAEROBIC Blood Culture adequate volume   Culture   Final    NO GROWTH 4 DAYS Performed at [facility], [place] [address], [place], [place] [address]    Report Status PENDING  Incomplete  Culture, blood (Routine X 2) w Reflex to ID Panel     Status: None (Preliminary result)   Collection Time: 02/05/22 10:59 AM  Specimen: BLOOD  Result Value Ref Range Status   Specimen Description BLOOD LEFT ANTECUBITAL  Final   Special Requests   Final    BOTTLES DRAWN AEROBIC AND ANAEROBIC Blood Culture adequate volume   Culture   Final    NO GROWTH 4 DAYS Performed at [facility], [address], [place], [place] [address]    Report Status PENDING  Incomplete         Radiology Studies: No results found.      Scheduled Meds:  chlorhexidine  15 mL Mouth Rinse BID   enoxaparin (LOVENOX) injection  40 mg Subcutaneous Daily   feeding supplement  237 mL Oral TID BM   mouth rinse  15 mL Mouth Rinse q12n4p   multivitamin with minerals  1 tablet Oral Daily   phosphorus  250 mg Oral BID   Continuous Infusions:  sodium chloride 75 mL/hr at 02/09/22 0827   cefTRIAXone (ROCEPHIN)  IV 2 g (02/09/22 1100)     LOS: 4 days       Phillips Climes, MD [facility]   To contact the attending provider between 7A-7P or the covering provider during after hours 7P-7A, please log into the web site [email or web address] and access using universal [place] password for that web site. If you do not have the password, please call the hospital operator.  02/09/2022, 1:39 PM

## 2022-02-09 NOTE — Progress Notes (Signed)
Physical Therapy Treatment Patient Details Name: [name] [name] MRN: [id number] DOB: May 04, 1931 Today's Date: 02/09/2022   History of Present Illness Pt adm from ALF with AMS and AKI. PMH - dementia, rt hip fx, lt hip fx    PT Comments    Patient remains confused, but easily directed with cues and manual facilitation. Patient's POA's wife present Veterinary surgeon) and reports he is more confused than usual. Reports he is supposed to use a RW at ALF, but he forgets and walks around without one. Overhead from [place] that pt is not currently eating and Hospice may become involved.     Recommendations for follow up therapy are one component of a multi-disciplinary discharge planning process, led by the attending physician.  Recommendations may be updated based on patient status, additional functional criteria and insurance authorization.  Follow Up Recommendations  Home health PT (at ALF)     Assistance Recommended at Discharge Frequent or constant Supervision/Assistance  Patient can return home with the following Two people to help with walking and/or transfers;A lot of help with bathing/dressing/bathroom;Direct supervision/assist for medications management;Assistance with feeding   Equipment Recommendations  None recommended by PT    Recommendations for Other Services       Precautions / Restrictions Precautions Precautions: Fall;Other (comment) Precaution Comments: agitation at times Restrictions Weight Bearing Restrictions: No     Mobility  Bed Mobility Overal bed mobility: Needs Assistance Bed Mobility: Supine to Sit, Sit to Supine     Supine to sit: Mod assist, HOB elevated Sit to supine: Mod assist   General bed mobility comments: Assist to bring legs off of bed, elevate trunk into sitting, and bring hips to EOB. Assist to lower trunk and bring legs back up onto bed    Transfers Overall transfer level: Needs assistance Equipment used: 2 person hand held assist Transfers:  Sit to/from Stand Sit to Stand: Min assist, +2 physical assistance           General transfer comment: Assist to bring hips up and for balance. Stood x 2 reps    Ambulation/Gait Ambulation/Gait assistance: Mod assist, +2 physical assistance Gait Distance (Feet): 3 Feet Assistive device: 2 person hand held assist Gait Pattern/deviations: Step-to pattern, Knee flexed in stance - right, Knee flexed in stance - left, Shuffle, Narrow base of support       General Gait Details: pt initiated stepping/walking, but with poor foot clearance and nearly crouched posture; after 3 feet, bed brought up behind pt   Stairs             Wheelchair Mobility    Modified Rankin (Stroke Patients Only)       Balance Overall balance assessment: Needs assistance Sitting-balance support: No upper extremity supported, Feet supported Sitting balance-Leahy Scale: Fair     Standing balance support: Bilateral upper extremity supported Standing balance-Leahy Scale: Poor Standing balance comment: UE support and min to mod assist for static standing                            Cognition Arousal/Alertness: Awake/alert Behavior During Therapy: Flat affect Overall Cognitive Status: Impaired/Different from baseline                                 General Comments: Dementia at baseline, but normally able to ambulate without a device        Exercises  General Comments        Pertinent Vitals/Pain Pain Assessment Breathing: normal Negative Vocalization: none Facial Expression: smiling or inexpressive Body Language: relaxed Consolability: no need to console PAINAD Score: 0    Home Living                          Prior Function            PT Goals (current goals can now be found in the care plan section) Acute Rehab PT Goals Patient Stated Goal: Pt unable to state PT Goal Formulation: Patient unable to participate in goal setting Time For  Goal Achievement: 02/20/22 Potential to Achieve Goals: Fair Progress towards PT goals: Progressing toward goals    Frequency    Min 3X/week      PT Plan Current plan remains appropriate (per POAs wife, [name] has said they can take pt back and provide care he needs)    Co-evaluation              AM-PAC PT "6 Clicks" Mobility   Outcome Measure  Help needed turning from your back to your side while in a flat bed without using bedrails?: A Lot Help needed moving from lying on your back to sitting on the side of a flat bed without using bedrails?: A Lot Help needed moving to and from a bed to a chair (including a wheelchair)?: Total Help needed standing up from a chair using your arms (e.g., wheelchair or bedside chair)?: A Lot Help needed to walk in hospital room?: Total Help needed climbing 3-5 steps with a railing? : Total 6 Click Score: 9    End of Session Equipment Utilized During Treatment: Gait belt Activity Tolerance: Other (comment) (Limited by cognition) Patient left: in bed;with call bell/phone within reach;with bed alarm set Nurse Communication: Mobility status;Other (comment) (awake and lunch arrived (needs assist)) PT Visit Diagnosis: Other abnormalities of gait and mobility (R26.89);Unsteadiness on feet (R26.81);History of falling (Z91.81);Repeated falls (R29.6)     Time: 9470-9628 PT Time Calculation (min) (ACUTE ONLY): 20 min  Charges:  $Gait Training: 8-22 mins                      [place]  Office [phone]    Rexanne Mano 02/09/2022, 2:52 PM

## 2022-02-10 DIAGNOSIS — G9341 Metabolic encephalopathy: Secondary | ICD-10-CM | POA: Diagnosis not present

## 2022-02-10 LAB — CULTURE, BLOOD (ROUTINE X 2)
Culture: NO GROWTH
Culture: NO GROWTH
Special Requests: ADEQUATE
Special Requests: ADEQUATE

## 2022-02-10 MED ORDER — LORAZEPAM 0.5 MG PO TABS: 0.5000 mg | ORAL_TABLET | ORAL | 0 refills | Status: AC | PRN

## 2022-02-10 MED ORDER — DOXYCYCLINE HYCLATE 100 MG PO TABS: 100.0000 mg | ORAL_TABLET | Freq: Two times a day (BID) | ORAL | 0 refills | Status: AC

## 2022-02-10 MED ORDER — OXYCODONE-ACETAMINOPHEN 5-325 MG PO TABS
1.0000 | ORAL_TABLET | Freq: Four times a day (QID) | ORAL | 0 refills | Status: AC | PRN
Start: 2022-02-10 — End: ?

## 2022-02-10 NOTE — Progress Notes (Signed)
RN attempted to call report for 3rd time, no response at this time. Patient picked up by [facility] for transport. Will attempt to call report in 30 minutes.

## 2022-02-10 NOTE — TOC Transition Note (Signed)
Transition of Care [facility]) - CM/SW Discharge Note   Patient Details  Name: [name] [name] MRN: [id number] Date of Birth: Jul 16, 1931  [facility] CM/SW Contact:  Benard Halsted, LCSW Phone Number: 02/10/2022, 4:07 PM   Clinical Narrative:    Patient will DC to: [place] Anticipated DC date: 02/10/22 Family notified: Annitta Needs Transport by: Corey Harold   Per MD patient ready for DC to [facility]. RN to call report prior to discharge [phone], option 2). RN, patient, patient's family, [facility], and facility notified of DC. Discharge Summary and FL2 sent to facility. DC packet on chart including signed DNR. Ambulance transport requested for patient.   CSW will sign off for now as social work intervention is no longer needed. Please consult [place] again if new needs arise.     Final next level of care: Memory Care Barriers to Discharge: No Barriers Identified   Patient Goals and CMS Choice Patient states their goals for this hospitalization and ongoing recovery are:: comfort CMS Medicare.gov Compare Post Acute Care list provided to:: Patient Represenative (must comment) Choice offered to / list presented to : [place] / [place]  Discharge Placement              Patient chooses bed at:  [facility]) Patient to be transferred to facility by: [place] Name of family member notified: Aaron Edelman Patient and family notified of of transfer: 02/10/22  Discharge Plan and Services In-house Referral: Clinical Social Work   Post Acute Care Choice: Hospice                               Social Determinants of Health (SDOH) Interventions     Readmission Risk Interventions     View : No data to display.

## 2022-02-10 NOTE — Discharge Instructions (Signed)
Feeding is for comfort, patient is hospice care.

## 2022-02-10 NOTE — Discharge Summary (Signed)
Physician Discharge Summary  Logan Aking Klabunde XWR:[id number] DOB: 12-18-1930 DOA: 02/04/2022  PCP: [facility]  Admit date: 02/04/2022 Discharge date: 02/10/2022  Admitted From: ALF Disposition:  ALF/Memory unit  Recommendations for Outpatient Follow-up:  Management per hospice   Discharge Condition: hospice CODE STATUS:DNR Diet recommendation: Regular   Brief/Interim Summary:  88 year old male with past medical history of dementia, hyperlipidemia, gastroesophageal reflux disease presenting to [facility] emergency department from [facility] with confusion and bouts of agitation, significantly different than baseline.   Patient is a poor historian due to known history of dementia.   His work-up significant for AKI, dehydration, infection due to cellulitis, as well he noted to have myoclonic jerking, he was treated appropriately with IV antibiotics, IV fluids, infection has subsided, and right has been corrected, but he mains significantly weak, frail, with very poor appetite and significant failure to thrive, does appear he has been worsening over the last few months with poor appetite less ambulation and appears to be reaching end-stage dementia, please see discussion below.     Goals of care -I have discussed with his POA Ms Lavone Neri at bedside 02/09/2022, patient wishes were very clear for no life extending measures, his dementia has been very progressive, with significant worsening over last few month, where his appetite, has decreased significantly, as well his mental acuity, and speech has worsened, at this point is with very poor life quality, and currently his status is irreversible due to advanced /end-stage dementia, I have discussed with her , we think to go back to his facility under hospice care will be the best measure, she has already discussed with them and she reports [name] can take him back with hospice, medicine has been consulted and their  assistance greatly appreciated.      Acute metabolic encephalopathy -With underlying baseline dementia. -Acute encephalopathy secondary to dehydration, uremia, AKI and cellulitis. -CT head with no acute findings. -TSH mildly elevated at 5, -B12 within normal limit -Ammonia normal limit -Remains somnolent, less interactive, with very poor oral intake, please see above discussion, he is at end-stage dementia.      Hypernatremia -due to poor oral intake, repeat with half-normal saline   AKI (acute kidney injury) ([place]) -Volume depletion and dehydration, improved with IV fluids   Cellulitis of right lower extremity - Notable redness induration and warmth of the right lower extremity on exam - Venous Dopplers negative for DVT -Did with IV Rocephin during hospital stay, will be discharged on doxycycline.    Atrial fibrillation ([place]) - incidental finding of atrial fibrillation on admission EKG  - Unable to assess for associated symptoms as patient is currently nonverbal - Afib possibly secondary to underlying infection and uremia and may be self limiting.   -TSH mildly elevated at 5, no evidence of hyperthyroidism -Heart rate is controlled -Poor candidate for anticoagulation due to his advanced dementia, and high risk for fall   Myoclonic jerking - ER provider has discussed witnessed jerking with Dr. Curly Shores with neurology  believes patient may be suffering from uremia as the cause.  -This has resolved after kidney function and uremia has improved.   Mixed hyperlipidemia - Continuing home regimen of lipid lowering therapy.     GERD without esophagitis - Continuing home regimen of daily PPI therapy.     Dementia without behavioral disturbance (HCC) - Presence of advanced dementia complicates evaluation of this patient presenting with acute metabolic encephalopathy - Minimizing uncomfortable stimuli - Minimizing mood altering and sedating agents -  Frequent redirection by  staff - Fall precautions     Prolonged QT interval - Avoiding QT prolonging agents      Severe protein calorie malnutrition -enourage oral intake, currently hospice care      Discharge Diagnoses:  Principal Problem:   Acute metabolic encephalopathy Active Problems:   AKI (acute kidney injury) ([place])   Lactic acidosis   Cellulitis of right lower extremity   Mixed hyperlipidemia   Myoclonic jerking   Atrial fibrillation (HCC)   GERD without esophagitis   Dementia without behavioral disturbance (HCC)   Prolonged QT interval   Protein-calorie malnutrition, severe    Discharge Instructions  Discharge Instructions     Diet - low sodium heart healthy   Complete by: As directed    Discharge instructions   Complete by: As directed    Patient is hospice, feedings for comfort,   Increase activity slowly   Complete by: As directed       Allergies as of 02/10/2022       Reactions   Codeine Other (See Comments)   Unknown rxn per MAR   Penicillins Rash   Per MAR Did it involve swelling of the face/tongue/throat, SOB, or low BP? Unknown Did it involve sudden or severe rash/hives, skin peeling, or any reaction on the inside of your mouth or nose? Unknown Did you need to seek medical attention at a hospital or doctor's office? Unknown When did it last happen? Unknown       If all above answers are "NO", may proceed with cephalosporin use.   Ranitidine Other (See Comments)   Unknown rxn per MAR   Amoxicillin Rash   Per MAR Did it involve swelling of the face/tongue/throat, SOB, or low BP? Unknown Did it involve sudden or severe rash/hives, skin peeling, or any reaction on the inside of your mouth or nose? Unknown Did you need to seek medical attention at a hospital or doctor's office? Unknown When did it last happen? Unknown       If all above answers are "NO", may proceed with cephalosporin use.        Medication List     TAKE these medications    acetaminophen  325 MG tablet Commonly known as: TYLENOL Take 2 tablets (650 mg total) by mouth every 6 (six) hours as needed for mild pain, fever or headache (pain score 1-3 or temp > 100.5).   aspirin 81 MG chewable tablet Chew 81 mg by mouth daily.   cholecalciferol 25 MCG (1000 UNIT) tablet Commonly known as: VITAMIN D3 Take 1,000 Units by mouth daily.   docusate sodium 100 MG capsule Commonly known as: COLACE Take 1 capsule (100 mg total) by mouth 2 (two) times daily.   doxycycline 100 MG tablet Commonly known as: VIBRA-TABS Take 1 tablet (100 mg total) by mouth 2 (two) times daily.   enoxaparin 40 MG/0.4ML injection Commonly known as: LOVENOX Inject 0.4 mLs (40 mg total) into the skin daily.   feeding supplement Liqd Take 237 mLs by mouth 2 (two) times daily between meals. What changed: when to take this   ferrous sulfate 324 MG Tbec Take 324 mg by mouth daily with breakfast.   hydrOXYzine 25 MG tablet Commonly known as: ATARAX Take 25 mg by mouth 3 (three) times daily as needed.   LORazepam 0.5 MG tablet Commonly known as: ATIVAN Take 1 tablet (0.5 mg total) by mouth every 4 (four) hours as needed for anxiety or sedation (agitation). What changed:  when  to take this reasons to take this   methocarbamol 500 MG tablet Commonly known as: ROBAXIN Take 1 tablet (500 mg total) by mouth every 6 (six) hours as needed for muscle spasms.   mirtazapine 7.5 MG tablet Commonly known as: REMERON Take 7.5 mg by mouth at bedtime.   multivitamin with minerals Tabs tablet Take 1 tablet by mouth daily.   neomycin-bacitracin-polymyxin [phone] ointment Apply 1 application topically every evening. Apply to left wrist for skin tear   nystatin cream Commonly known as: MYCOSTATIN Apply 1 application topically 2 (two) times daily. Apply to groin/penis for yeast   oxyCODONE-acetaminophen 5-325 MG tablet Commonly known as: Percocet Take 1 tablet by mouth every 6 (six) hours as needed for  severe pain. What changed:  how much to take when to take this   polyethylene glycol 17 g packet Commonly known as: MIRALAX / GLYCOLAX Take 17 g by mouth daily as needed for mild constipation.   pravastatin 10 MG tablet Commonly known as: PRAVACHOL Take 10 mg by mouth daily.   traZODone 50 MG tablet Commonly known as: DESYREL Take 1 tablet (50 mg total) by mouth at bedtime.   triamcinolone cream 0.1 % Commonly known as: KENALOG Apply 1 application. topically 2 (two) times daily. Both hands        Allergies  Allergen Reactions   Codeine Other (See Comments)    Unknown rxn per MAR   Penicillins Rash    Per MAR Did it involve swelling of the face/tongue/throat, SOB, or low BP? Unknown Did it involve sudden or severe rash/hives, skin peeling, or any reaction on the inside of your mouth or nose? Unknown Did you need to seek medical attention at a hospital or doctor's office? Unknown When did it last happen? Unknown       If all above answers are "NO", may proceed with cephalosporin use.   Ranitidine Other (See Comments)    Unknown rxn per MAR   Amoxicillin Rash    Per MAR Did it involve swelling of the face/tongue/throat, SOB, or low BP? Unknown Did it involve sudden or severe rash/hives, skin peeling, or any reaction on the inside of your mouth or nose? Unknown Did you need to seek medical attention at a hospital or doctor's office? Unknown When did it last happen? Unknown       If all above answers are "NO", may proceed with cephalosporin use.    Consultations: Palliative medicine   Procedures/Studies: DG Knee 1-2 Views Right  Result Date: 02/05/2022 CLINICAL DATA:  Swelling and pain in a patient with history of dementia at age 29. EXAM: RIGHT KNEE - 1-2 VIEW COMPARISON:  None Available. FINDINGS: Osteopenia. Degenerative changes about the knee. Patellofemoral enthesopathy. No joint effusion. AP projection mildly limited due to knee held in flexion. No visible  fracture. IMPRESSION: Degenerative changes about the knee. No joint effusion. No acute bone finding. Mildly limited assessment on AP projection due to knee position. If there is high concern for knee injury could consider repeat AP images unless patient condition does not allow for straightening of the knee. Electronically Signed   By: Zetta Bills M.D.   On: 02/05/2022 13:04   CT Head Wo Contrast  Result Date: 02/04/2022 CLINICAL DATA:  Mental status change, unknown cause. EXAM: CT HEAD WITHOUT CONTRAST TECHNIQUE: Contiguous axial images were obtained from the base of the skull through the vertex without intravenous contrast. RADIATION DOSE REDUCTION: This exam was performed according to the departmental dose-optimization program which  includes automated exposure control, adjustment of the mA and/or kV according to patient size and/or use of iterative reconstruction technique. COMPARISON:  CT of 08/18/2020 FINDINGS: Brain: Imaging performed x2 with both imaging attempts somewhat motion compromised. Again noted are mild-to-moderate features of cerebrocerebellar atrophy with atrophic ventricular expansion and moderately advanced small vessel disease of the cerebral white matter. There are senescent calcifications in the basal ganglia. No asymmetry consistent with acute infarct, hemorrhage or mass is seen through the motion artifact. There are benign dural calcifications along side of the falx at the vertex. Vascular: There are calcifications in the siphons and distal vertebral arteries. No hyperdense central vessel is seen through the motion artifact. Skull: No fracture or focal bone lesion. The calvarium, skull base and orbits are intact. Sinuses/Orbits: There are old lens replacements. Patchy fluid is again noted in the left mastoid air cells. Right mastoids and both middle ears are clear. There are filling defects in the external auditory canals consistent with cerumen impactions. Membrane thickening in the  maxillary sinuses is seen with other sinuses remaining clear. Other: None. IMPRESSION: Motion limited exam grossly negative for acute changes. Chronic findings discussed above. Incidental note of likely bilateral cerumen impactions. Electronically Signed   By: Telford Nab M.D.   On: 02/04/2022 23:48   DG Chest Port 1 View  Result Date: 02/04/2022 CLINICAL DATA:  Altered mental status. EXAM: PORTABLE CHEST 1 VIEW COMPARISON:  Chest x-ray 03/16/2019 FINDINGS: The heart size and mediastinal contours are within normal limits. Again seen are atherosclerotic calcifications of the aorta. Both lungs are clear. The visualized skeletal structures are unremarkable. IMPRESSION: No active disease. Electronically Signed   By: Ronney Asters M.D.   On: 02/04/2022 21:11   ECHOCARDIOGRAM COMPLETE  Result Date: 02/06/2022    ECHOCARDIOGRAM REPORT   Patient Name:   Noriel ALFRED Aigner Date of Exam: 02/06/2022 Medical Rec #:  [id number]           Height:       72.0 in Accession #:    [id number]          Weight:       150.0 lb Date of Birth:  1930-11-04            BSA:          1.885 m Patient Age:    36 years            BP:           122/72 mmHg Patient Gender: M                   HR:           85 bpm. Exam Location:  Inpatient Procedure: 2D Echo, Cardiac Doppler and Color Doppler Indications:    Atrial Fibrillation  History:        Patient has no prior history of Echocardiogram examinations.                 Arrythmias:Atrial Fibrillation.  Sonographer:    Jefferey Pica Referring Phys: [id number] [place]  1. Globaly hypokinesis mildly worse in the inferior, inferolateral segements from base-apex. Left ventricular ejection fraction, by estimation, is 30 to 35%. The left ventricle has moderately decreased function. The left ventricular internal cavity size  was mildly dilated. Left ventricular diastolic function could not be evaluated.  2. Right ventricular systolic function is mildly reduced. The right  ventricular size is normal. There is mildly elevated pulmonary artery systolic pressure.  3. Left atrial size was mildly dilated.  4. Right atrial size was mildly dilated.  5. The mitral valve is degenerative. Mild to moderate mitral valve regurgitation.  6. There is mild calcification of the aortic valve. Aortic valve regurgitation is mild to moderate. Aortic valve sclerosis/calcification is present, without any evidence of aortic stenosis.  7. Aortic ascending aortic aneurysm 4.5 cm.  8. The inferior vena cava is dilated in size with >50% respiratory variability, suggesting right atrial pressure of 8 mmHg. Comparison(s): No prior Echocardiogram. FINDINGS  Left Ventricle: Globaly hypokinesis mildly worse in the inferior, inferolateral segements from base-apex. Left ventricular ejection fraction, by estimation, is 30 to 35%. The left ventricle has moderately decreased function. The left ventricular internal cavity size was mildly dilated. There is borderline left ventricular hypertrophy. Left ventricular diastolic function could not be evaluated. Right Ventricle: The right ventricular size is normal. Right ventricular systolic function is mildly reduced. There is mildly elevated pulmonary artery systolic pressure. The tricuspid regurgitant velocity is 2.53 m/s, and with an assumed right atrial pressure of 15 mmHg, the estimated right ventricular systolic pressure is 15.7 mmHg. Left Atrium: Left atrial size was mildly dilated. Right Atrium: Right atrial size was mildly dilated. Pericardium: There is no evidence of pericardial effusion. Mitral Valve: The mitral valve is degenerative in appearance. Mild to moderate mitral valve regurgitation. Tricuspid Valve: The tricuspid valve is normal in structure. Tricuspid valve regurgitation is trivial. Aortic Valve: There is mild calcification of the aortic valve. Aortic valve regurgitation is mild to moderate. Aortic valve sclerosis/calcification is present, without any  evidence of aortic stenosis. Aortic valve peak gradient measures 3.7 mmHg. Pulmonic Valve: The pulmonic valve was not well visualized. Pulmonic valve regurgitation is not visualized. Aorta: Ascending aortic aneurysm 4.5 cm. Venous: The inferior vena cava is dilated in size with greater than 50% respiratory variability, suggesting right atrial pressure of 8 mmHg. IAS/Shunts: No atrial level shunt detected by color flow Doppler.  LEFT VENTRICLE PLAX 2D LVIDd:         5.80 cm LVIDs:         4.60 cm LV PW:         1.00 cm LV IVS:        1.10 cm LVOT diam:     2.10 cm LV SV:         74 LV SV Index:   39 LVOT Area:     3.46 cm  LV Volumes (MOD) LV vol d, MOD A4C: 130.0 ml LV vol s, MOD A4C: 101.0 ml LV SV MOD A4C:     130.0 ml RIGHT VENTRICLE          IVC RV Basal diam:  3.00 cm  IVC diam: 2.10 cm LEFT ATRIUM             Index        RIGHT ATRIUM           Index LA diam:        3.60 cm 1.91 cm/m   RA Area:     19.40 cm LA Vol (A2C):   63.7 ml 33.79 ml/m  RA Volume:   61.10 ml  32.41 ml/m LA Vol (A4C):   46.0 ml 24.40 ml/m LA Biplane Vol: 56.2 ml 29.81 ml/m  AORTIC VALVE AV Area (Vmax): 2.85 cm AV Vmax:        95.53 cm/s AV Peak Grad:   3.7 mmHg LVOT Vmax:      78.65 cm/s LVOT Vmean:  51.825 cm/s LVOT VTI:       0.213 m  AORTA Ao Root diam: 5.00 cm Ao Asc diam:  4.45 cm MR Peak grad: 86.5 mmHg   TRICUSPID VALVE MR Vmax:      465.00 cm/s TR Peak grad:   25.6 mmHg                           TR Vmax:        253.00 cm/s                            SHUNTS                           Systemic VTI:  0.21 m                           Systemic Diam: 2.10 cm Phineas Inches Electronically signed by Phineas Inches Signature Date/Time: 02/06/2022/9:33:31 AM    Final    VAS [place] LOWER EXTREMITY VENOUS (DVT)  Result Date: 02/05/2022  Lower Venous DVT Study Patient Name:  Reakwon ALFRED Demarco  Date of Exam:   02/05/2022 Medical Rec #: [id number]            Accession #:    [id number] Date of Birth: [birth year]             Patient Gender: M Patient  Age:   28 years Exam Location:  [facility] Procedure:      VAS [place] LOWER EXTREMITY VENOUS (DVT) Referring Phys: Inda Merlin --------------------------------------------------------------------------------  Indications: Pain.  Risk Factors: None identified. Limitations: Poor ultrasound/tissue interface, patient positioning, patient movement and body habitus. Comparison Study: No prior studies. Performing Technologist: Oliver Hum RVT  Examination Guidelines: A complete evaluation includes B-mode imaging, spectral Doppler, color Doppler, and power Doppler as needed of all accessible portions of each vessel. Bilateral testing is considered an integral part of a complete examination. Limited examinations for reoccurring indications may be performed as noted. The reflux portion of the exam is performed with the patient in reverse Trendelenburg.  +---------+---------------+---------+-----------+----------+--------------+ RIGHT    CompressibilityPhasicitySpontaneityPropertiesThrombus Aging +---------+---------------+---------+-----------+----------+--------------+ CFV      Full           Yes      Yes                                 +---------+---------------+---------+-----------+----------+--------------+ SFJ      Full                                                        +---------+---------------+---------+-----------+----------+--------------+ FV Prox  Full                                                        +---------+---------------+---------+-----------+----------+--------------+ FV Mid   Full                                                        +---------+---------------+---------+-----------+----------+--------------+  FV DistalFull                                                        +---------+---------------+---------+-----------+----------+--------------+ PFV      Full                                                         +---------+---------------+---------+-----------+----------+--------------+ POP      Full           Yes      Yes                                 +---------+---------------+---------+-----------+----------+--------------+ PTV      Full                                                        +---------+---------------+---------+-----------+----------+--------------+ PERO     Full                                                        +---------+---------------+---------+-----------+----------+--------------+   +----+---------------+---------+-----------+----------+--------------+ LEFTCompressibilityPhasicitySpontaneityPropertiesThrombus Aging +----+---------------+---------+-----------+----------+--------------+ CFV Full           Yes      Yes                                 +----+---------------+---------+-----------+----------+--------------+    Summary: RIGHT: - There is no evidence of deep vein thrombosis in the lower extremity.  - No cystic structure found in the popliteal fossa.  LEFT: - No evidence of common femoral vein obstruction.  *See table(s) above for measurements and observations. Electronically signed by Orlie Pollen on 02/05/2022 at 5:20:54 PM.    Final       Subjective: No significant events overnight as discussed with staff, patient remains weak, frail, with very poor appetite  Discharge Exam: Vitals:   02/10/22 0808 02/10/22 1117  BP: 135/77 135/77  Pulse: 68 67  Resp: 16 18  Temp: 98 F (36.7 C) 97.9 F (36.6 C)  SpO2: 99% 99%   Vitals:   02/10/22 0000 02/10/22 0400 02/10/22 0808 02/10/22 1117  BP: (!) 152/93 (!) 167/95 135/77 135/77  Pulse: 79 71 68 67  Resp: '15 15 16 18  '$ Temp: 98.2 F (36.8 C) 98.2 F (36.8 C) 98 F (36.7 C) 97.9 F (36.6 C)  TempSrc: Axillary Axillary Axillary Axillary  SpO2: 99% 99% 99% 99%  Weight:      Height:        General: Wakes up intermittently, open eye, less interactive, does not answer any questions  or follow any commands, extremely frail, cachectic and thin appearing Cardiovascular: RRR, S1/S2 +, no rubs, no gallops Respiratory: CTA bilaterally, no wheezing, no rhonchi Abdominal: Soft,  NT, ND, bowel sounds + Extremities: Right lower extremity edema with erythema    The results of significant diagnostics from this hospitalization (including imaging, microbiology, ancillary and laboratory) are listed below for reference.     Microbiology: Recent Results (from the past 240 hour(s))  SARS Coronavirus 2 by RT PCR (hospital order, performed in [facility] hospital lab) *cepheid single result test* Anterior Nasal Swab     Status: None   Collection Time: 02/05/22  5:40 AM   Specimen: Anterior Nasal Swab  Result Value Ref Range Status   SARS Coronavirus 2 by RT PCR NEGATIVE NEGATIVE Final    Comment: (NOTE) SARS-CoV-2 target nucleic acids are NOT DETECTED.  The SARS-CoV-2 RNA is generally detectable in upper and lower respiratory specimens during the acute phase of infection. The lowest concentration of SARS-CoV-2 viral copies this assay can detect is 250 copies / mL. A negative result does not preclude SARS-CoV-2 infection and should not be used as the sole basis for treatment or other patient management decisions.  A negative result may occur with improper specimen collection / handling, submission of specimen other than nasopharyngeal swab, presence of viral mutation(s) within the areas targeted by this assay, and inadequate number of viral copies (<250 copies / mL). A negative result must be combined with clinical observations, patient history, and epidemiological information.  Fact Sheet for Patients:   https://www.patel.info/  Fact Sheet for Healthcare Providers: https://hall.com/  This test is not yet approved or  cleared by the [place] FDA and has been authorized for detection and/or diagnosis of SARS-CoV-2 by FDA under an  Emergency Use Authorization (EUA).  This EUA will remain in effect (meaning this test can be used) for the duration of the COVID-19 declaration under Section 564(b)(1) of the Act, 21 U.S.C. section 360bbb-3(b)(1), unless the authorization is terminated or revoked sooner.  Performed at [facility], [place] [address] [place], [place] [address]   Culture, blood (Routine X 2) w Reflex to ID Panel     Status: None   Collection Time: 02/05/22 10:54 AM   Specimen: BLOOD  Result Value Ref Range Status   Specimen Description BLOOD RIGHT ANTECUBITAL  Final   Special Requests   Final    BOTTLES DRAWN AEROBIC AND ANAEROBIC Blood Culture adequate volume   Culture   Final    NO GROWTH 5 DAYS Performed at [facility], [place] [address], [place], [place] [address]    Report Status 02/10/2022 FINAL  Final  Culture, blood (Routine X 2) w Reflex to ID Panel     Status: None   Collection Time: 02/05/22 10:59 AM   Specimen: BLOOD  Result Value Ref Range Status   Specimen Description BLOOD LEFT ANTECUBITAL  Final   Special Requests   Final    BOTTLES DRAWN AEROBIC AND ANAEROBIC Blood Culture adequate volume   Culture   Final    NO GROWTH 5 DAYS Performed at [facility], [place] [address] [place], [place] [address]    Report Status 02/10/2022 FINAL  Final     Labs: BNP (last 3 results) No results for input(s): BNP in the last 8760 hours. Basic Metabolic Panel: Recent Labs  Lab 02/04/22 2050 02/04/22 2248 02/05/22 0900 02/06/22 0222 02/07/22 0149 02/08/22 0113 02/09/22 0143  NA 142   < > 143 144 143 144 146*  K 3.6   < > 3.8 4.2 4.1 3.8 3.7  CL 104  --  109 108 107 108  111  CO2 30  --  '28 27 27 28 27  '$ GLUCOSE 115*  --  89 75 76 91 91  BUN 65*  --  54* 46* 43* 43* 35*  CREATININE 1.88*  --  1.51* 1.30* 1.20 1.31* 1.10  CALCIUM 8.8*  --  8.7* 8.7* 8.9 8.7* 8.6*  MG 2.0  --  2.0 1.8 2.0  --   --   PHOS  --   --   --   --  3.6 3.0 3.6   < > = values in this interval not  displayed.   Liver Function Tests: Recent Labs  Lab 02/04/22 2050 02/05/22 0900 02/06/22 0222  AST '29 25 25  '$ ALT '23 19 22  '$ ALKPHOS 67 63 65  BILITOT 0.2* 0.3 0.4  PROT 6.2* 5.6* 5.7*  ALBUMIN 3.0* 2.7* 2.4*   No results for input(s): LIPASE, AMYLASE in the last 168 hours. Recent Labs  Lab 02/04/22 2050  AMMONIA 14   CBC: Recent Labs  Lab 02/04/22 2050 02/04/22 2248 02/05/22 0106 02/05/22 0653 02/07/22 0149 02/08/22 0113 02/09/22 0143  WBC 12.6*  --   --  12.6* 13.5* 9.4 5.5  NEUTROABS 4.8  --   --  11.8*  --   --   --   HGB 10.8*   < > 10.9* 10.6* 12.1* 11.4* 11.1*  HCT 32.8*   < > 32.0* 33.0* 37.2* 34.5* 33.7*  MCV 92.9  --   --  92.7 92.1 91.5 92.6  PLT 121*  --   --  136* 121* 132* 123*   < > = values in this interval not displayed.   Cardiac Enzymes: No results for input(s): CKTOTAL, CKMB, CKMBINDEX, TROPONINI in the last 168 hours. BNP: Invalid input(s): POCBNP CBG: No results for input(s): GLUCAP in the last 168 hours. D-Dimer No results for input(s): DDIMER in the last 72 hours. Hgb A1c No results for input(s): HGBA1C in the last 72 hours. Lipid Profile No results for input(s): CHOL, HDL, LDLCALC, TRIG, CHOLHDL, LDLDIRECT in the last 72 hours. Thyroid function studies No results for input(s): TSH, T4TOTAL, T3FREE, THYROIDAB in the last 72 hours.  Invalid input(s): FREET3 Anemia work up No results for input(s): VITAMINB12, FOLATE, FERRITIN, TIBC, IRON, RETICCTPCT in the last 72 hours. Urinalysis    Component Value Date/Time   COLORURINE YELLOW 02/04/2022 2319   APPEARANCEUR CLEAR 02/04/2022 2319   LABSPEC 1.015 02/04/2022 2319   PHURINE 5.0 02/04/2022 2319   GLUCOSEU NEGATIVE 02/04/2022 2319   HGBUR NEGATIVE 02/04/2022 2319   BILIRUBINUR NEGATIVE 02/04/2022 2319   KETONESUR NEGATIVE 02/04/2022 2319   PROTEINUR NEGATIVE 02/04/2022 2319   NITRITE NEGATIVE 02/04/2022 2319   LEUKOCYTESUR NEGATIVE 02/04/2022 2319   Sepsis Labs Invalid  input(s): PROCALCITONIN,  WBC,  LACTICIDVEN Microbiology Recent Results (from the past 240 hour(s))  SARS Coronavirus 2 by RT PCR (hospital order, performed in [place] hospital lab) *cepheid single result test* Anterior Nasal Swab     Status: None   Collection Time: 02/05/22  5:40 AM   Specimen: Anterior Nasal Swab  Result Value Ref Range Status   SARS Coronavirus 2 by RT PCR NEGATIVE NEGATIVE Final    Comment: (NOTE) SARS-CoV-2 target nucleic acids are NOT DETECTED.  The SARS-CoV-2 RNA is generally detectable in upper and lower respiratory specimens during the acute phase of infection. The lowest concentration of SARS-CoV-2 viral copies this assay can detect is 250 copies / mL. A negative result does not preclude SARS-CoV-2 infection and should  not be used as the sole basis for treatment or other patient management decisions.  A negative result may occur with improper specimen collection / handling, submission of specimen other than nasopharyngeal swab, presence of viral mutation(s) within the areas targeted by this assay, and inadequate number of viral copies (<250 copies / mL). A negative result must be combined with clinical observations, patient history, and epidemiological information.  Fact Sheet for Patients:   https://www.patel.info/  Fact Sheet for Healthcare Providers: https://hall.com/  This test is not yet approved or  cleared by the [place] FDA and has been authorized for detection and/or diagnosis of SARS-CoV-2 by FDA under an Emergency Use Authorization (EUA).  This EUA will remain in effect (meaning this test can be used) for the duration of the COVID-19 declaration under Section 564(b)(1) of the Act, 21 U.S.C. section 360bbb-3(b)(1), unless the authorization is terminated or revoked sooner.  Performed at [facility], [place] [address], [place], [place] [address]   Culture, blood (Routine X 2) w Reflex to  ID Panel     Status: None   Collection Time: 02/05/22 10:54 AM   Specimen: BLOOD  Result Value Ref Range Status   Specimen Description BLOOD RIGHT ANTECUBITAL  Final   Special Requests   Final    BOTTLES DRAWN AEROBIC AND ANAEROBIC Blood Culture adequate volume   Culture   Final    NO GROWTH 5 DAYS Performed at [facility], [place] [address], [place], [place] [address]    Report Status 02/10/2022 FINAL  Final  Culture, blood (Routine X 2) w Reflex to ID Panel     Status: None   Collection Time: 02/05/22 10:59 AM   Specimen: BLOOD  Result Value Ref Range Status   Specimen Description BLOOD LEFT ANTECUBITAL  Final   Special Requests   Final    BOTTLES DRAWN AEROBIC AND ANAEROBIC Blood Culture adequate volume   Culture   Final    NO GROWTH 5 DAYS Performed at [facility], [place] [address] [place], [place] [address]    Report Status 02/10/2022 FINAL  Final     Time coordinating discharge: Over 30 minutes  SIGNED:   Phillips Climes, MD  [facility] 02/10/2022, 2:05 PM Pager   If 7PM-7AM, please contact night-coverage [email or web address] Password [email or web address]

## 2022-02-10 NOTE — Progress Notes (Signed)
RN attempted to call report 2x with no response at this time. Will try to call again within 30 minutes.

## 2022-02-10 NOTE — Progress Notes (Addendum)
                                                                                                                                                                                                         Daily Progress Note   Patient Name: [name] [name]       Date: 02/10/2022 DOB: 07/14/1931  Age: 88 y.o. MRN#: [id number] Attending Physician: Elgergawy, Dawood S, MD Primary Care Physician: [facility] Admit Date: 02/04/2022  Reason for Consultation/Follow-up: Establishing goals of care and Hospice Evaluation  Subjective: Medical records reviewed. Patient assessed at the bedside. He is sleeping comfortably. Did not attempt to arouse.   Called patient's POA Katie (Karen) to provide palliative support and discuss goals of care further. She tells me they have discussed with [facility] and learned that [facility] is the hospice agency used at the facility. She is agreeable to this, as she has previous experience with good care provided by [facility]. She shares her experience with her father and the difficulty in securing placement when he was in and out of the hospital with acute illnesses and overall decline. Discussed goal of ensuring comfort and quality of life as the priority, ideally in Fredrik's familiar environment for as long as possible. His wife died 4 years ago and he has been very clear in his wishes for NO heroic measures should his dementia progress to later stages as it now has. He is ready for a transition to hospice philosophy based on their previous conversations.  Counseled on option to focus on comfort in the hospital and discharge back to facility with hospice sooner rather than delay for continued medical treatments. Katie shares that she has discussed with Dr. Elgergawy and that she understands he is approaching his baseline with the exception of worsening mental status, which likely will not improve. She would like to continue with supportive care until Kayla is  determined to be medically stable for discharge to ALF with hospice.  I then spoke again with patient's POA regarding MOST form completion. An extensive conversation was had, covering concepts specific to code status, artifical feeding and hydration, continued IV antibiotics and rehospitalization.     Cardiopulmonary Resuscitation: Do Not Attempt Resuscitation (DNR/No CPR)  Medical Interventions: Comfort Measures: Keep clean, warm, and dry. Use medication by any route, positioning, wound care, and other measures to relieve pain and suffering. Use oxygen, suction and manual treatment of airway obstruction as needed for comfort. Do not transfer to the hospital unless comfort needs cannot be met in current location.  Antibiotics: No   antibiotics (use other measures to relieve symptoms)  IV Fluids: No IV fluids (provide other measures to ensure comfort)  Feeding Tube: No feeding tube      Questions and concerns addressed. PMT will continue to support holistically.   Length of Stay: 5   Physical Exam Vitals reviewed.  Constitutional:      General: He is sleeping. He is not in acute distress.    Appearance: He is ill-appearing.  Cardiovascular:     Rate and Rhythm: Normal rate.  Pulmonary:     Effort: Pulmonary effort is normal.  Skin:    General: Skin is warm and dry.            Vital Signs: BP 135/77 (BP Location: Left Arm)   Pulse 67   Temp 97.9 F (36.6 C) (Axillary)   Resp 18   Ht 6' (1.829 m)   Wt 59.2 kg Comment: bedscale  SpO2 99%   BMI 17.70 kg/m  SpO2: SpO2: 99 % O2 Device: O2 Device: Room Air O2 Flow Rate:        Palliative Assessment/Data:     Palliative Care Assessment & Plan   Patient Profile: 88 y.o. male  with past medical history of dementia, hyperlipidemia, gastroesophageal reflux disease  admitted on 02/04/2022 with confusion and bouts of agitation, significantly different than baseline.    PMT has been consulted to assist with goals of care  conversation and further discussion of hospice.  Assessment: Goals of care conversation Dementia Acute encephalopathy AKI RLE Cellulitis  Recommendations/Plan: DNR POA agreeable to [facility] hosp[facility] at ALF when medically stable for discharge Continue supportive care PMT will continue to follow and support as needed  Addendum: MOST form was completed and electronically signed. Available in [place] for viewing and printing, with email copy sent to patient'a POA.  Prognosis:  < 6 months  Discharge Planning: ALF with hospice  Care plan was discussed with patient's POA Katie   MDM: [place], PA-C  Palliative Medicine Team Team phone # [phone]  Thank you for allowing the Palliative Medicine Team to assist in the care of this patient. Please utilize secure chat with additional questions, if there is no response within 30 minutes please call the above phone number.  Palliative Medicine Team providers are available by phone from 7am to 7pm daily and can be reached through the team cell phone.  Should this patient require assistance outside of these hours, please call the patient's attending physician.

## 2022-02-10 NOTE — NC FL2 (Signed)
[place] LEVEL OF CARE SCREENING TOOL     IDENTIFICATION  Patient Name: [name] [name] Birthdate: 1931-07-14 Sex: male Admission Date (Current Location): 02/04/2022  [facility] and [place] Number:  Herbalist and Address:  [facility], [place] [address], [place], [place] [address]      Provider Number: [id number]  Attending Physician Name and Address:  Elgergawy, Silver Huguenin, MD  Relative Name and Phone Number:       Current Level of Care: Hospital Recommended Level of Care: Memory Care Prior Approval Number:    Date Approved/Denied:   PASRR Number:    Discharge Plan: Other (Comment) (memory care)    Current Diagnoses: Patient Active Problem List   Diagnosis Date Noted   Dementia    Protein-calorie malnutrition, severe 02/07/2022   Atrial fibrillation ([place]) 02/06/2022   Prolonged QT interval 83/05/4075   Acute metabolic encephalopathy 80/88/1103   AKI (acute kidney injury) ([place]) 02/05/2022   Lactic acidosis 02/05/2022   Mixed hyperlipidemia 02/05/2022   Myoclonic jerking 02/05/2022   Cellulitis of right lower extremity 02/05/2022   Closed displaced fracture of right femoral neck ([place]) 02/19/2019   Hip fracture ([place]) 02/19/2019   Closed right hip fracture ([place]) 02/19/2019   Evaluation by psychiatric service required    Hypoxia 02/13/2018   Edema 02/13/2018   Hypoxemia 02/13/2018   Dementia without behavioral disturbance ([place]) 02/13/2018   Altered mental status 02/13/2018   Dermatitis 10/08/2015   CHEST PAIN, ATYPICAL 04/11/2010   Personal history of colon polyps and family history of colon cancer 12/19/2007   Anxiety state 12/19/2007   DEPRESSION 12/19/2007   Bicipital tendinitis, left shoulder 12/19/2007   ALLERGIC CONJUNCTIVITIS 12/19/2007   Seasonal allergic rhinitis 12/19/2007   GERD without esophagitis 12/19/2007   IBS 12/19/2007   Orientation RESPIRATION BLADDER Height & Weight      (Disoriented x4)   Normal Incontinent Weight: 130 lb 8.2 oz (59.2 kg) (bedscale) Height:  6' (182.9 cm)  BEHAVIORAL SYMPTOMS/MOOD NEUROLOGICAL BOWEL NUTRITION STATUS      Continent Diet (mechanical soft)  AMBULATORY STATUS COMMUNICATION OF NEEDS Skin   Extensive Assist Verbally Normal                       Personal Care Assistance Level of Assistance  Bathing, Feeding, Dressing Bathing Assistance: Maximum assistance Feeding assistance: Limited assistance Dressing Assistance: Limited assistance     Functional Limitations Info             SPECIAL CARE FACTORS FREQUENCY                       Contractures Contractures Info: Not present    Additional Factors Info  Code Status, Allergies Code Status Info: DNR Allergies Info: Codeine, Penicillins, Ranitidine, Amoxicillin           Current Medications (02/10/2022):   Discharge Medications: TAKE these medications     acetaminophen 325 MG tablet Commonly known as: TYLENOL Take 2 tablets (650 mg total) by mouth every 6 (six) hours as needed for mild pain, fever or headache (pain score 1-3 or temp > 100.5).    aspirin 81 MG chewable tablet Chew 81 mg by mouth daily.    cholecalciferol 25 MCG (1000 UNIT) tablet Commonly known as: VITAMIN D3 Take 1,000 Units by mouth daily.    docusate sodium 100 MG capsule Commonly known as: COLACE Take 1 capsule (100 mg total) by mouth 2 (  two) times daily.    doxycycline 100 MG tablet Commonly known as: VIBRA-TABS Take 1 tablet (100 mg total) by mouth 2 (two) times daily.    enoxaparin 40 MG/0.4ML injection Commonly known as: LOVENOX Inject 0.4 mLs (40 mg total) into the skin daily.    feeding supplement Liqd Take 237 mLs by mouth 2 (two) times daily between meals. What changed: when to take this    ferrous sulfate 324 MG Tbec Take 324 mg by mouth daily with breakfast.    hydrOXYzine 25 MG tablet Commonly known as: ATARAX Take 25 mg by mouth 3 (three) times daily as needed.     LORazepam 0.5 MG tablet Commonly known as: ATIVAN Take 1 tablet (0.5 mg total) by mouth every 4 (four) hours as needed for anxiety or sedation (agitation). What changed:  when to take this reasons to take this    methocarbamol 500 MG tablet Commonly known as: ROBAXIN Take 1 tablet (500 mg total) by mouth every 6 (six) hours as needed for muscle spasms.    mirtazapine 7.5 MG tablet Commonly known as: REMERON Take 7.5 mg by mouth at bedtime.    multivitamin with minerals Tabs tablet Take 1 tablet by mouth daily.    neomycin-bacitracin-polymyxin [phone] ointment Apply 1 application topically every evening. Apply to left wrist for skin tear    nystatin cream Commonly known as: MYCOSTATIN Apply 1 application topically 2 (two) times daily. Apply to groin/penis for yeast    oxyCODONE-acetaminophen 5-325 MG tablet Commonly known as: Percocet Take 1 tablet by mouth every 6 (six) hours as needed for severe pain. What changed:  how much to take when to take this    polyethylene glycol 17 g packet Commonly known as: MIRALAX / GLYCOLAX Take 17 g by mouth daily as needed for mild constipation.    pravastatin 10 MG tablet Commonly known as: PRAVACHOL Take 10 mg by mouth daily.    traZODone 50 MG tablet Commonly known as: DESYREL Take 1 tablet (50 mg total) by mouth at bedtime.    triamcinolone cream 0.1 % Commonly known as: KENALOG Apply 1 application. topically 2 (two) times daily. Both hands      Relevant Imaging Results:  Relevant Lab Results:   Additional Information SSN: [id number]. Add Hospice services at Memory care facility.  Benard Halsted, LCSW

## 2022-02-10 NOTE — TOC Progression Note (Signed)
Transition of Care [facility]) - Progression Note    Patient Details  Name: Lathan Tavio Biegel MRN: [id number] Date of Birth: 1931/07/11  Transition [facility] Care [facility]) CM/SW [place], LCSW Phone Number: 02/10/2022, 1:37 PM  Clinical Narrative:    CSW spoke with [facility]. They are aware of need for hospice and stated that they will follow up if patient requires any DME. CSW will email FL2 and DC Summary once available ([email or web address]). CSW notified [facility] of discharge today and they will coordinate an admission time for patient, likely tomorrow.    Expected Discharge Plan: Memory Care Barriers to Discharge: No Barriers Identified  Expected Discharge Plan and Services Expected Discharge Plan: Memory Care In-house Referral: Clinical Social Work   Post Acute Care Choice: Hospice Living arrangements for the past 2 months: [place]                                       Social Determinants of Health (SDOH) Interventions    Readmission Risk Interventions     View : No data to display.

## 2022-03-03 ENCOUNTER — Emergency Department (HOSPITAL_COMMUNITY)
Admission: EM | Admit: 2022-03-03 | Discharge: 2022-03-03 | Disposition: A | Discharge status: Other Acute Inpatient Hospital | Payer: No Typology Code available for payment source | Attending: Emergency Medicine | Admitting: Emergency Medicine

## 2022-03-03 ENCOUNTER — Encounter (HOSPITAL_COMMUNITY): Payer: Self-pay | Admitting: Radiology

## 2022-03-03 ENCOUNTER — Emergency Department (HOSPITAL_COMMUNITY): Payer: No Typology Code available for payment source

## 2022-03-03 DIAGNOSIS — F0390 Unspecified dementia without behavioral disturbance: Secondary | ICD-10-CM | POA: Diagnosis not present

## 2022-03-03 DIAGNOSIS — S0101XA Laceration without foreign body of scalp, initial encounter: Secondary | ICD-10-CM | POA: Insufficient documentation

## 2022-03-03 DIAGNOSIS — W1839XA Other fall on same level, initial encounter: Secondary | ICD-10-CM | POA: Insufficient documentation

## 2022-03-03 DIAGNOSIS — Z515 Encounter for palliative care: Secondary | ICD-10-CM | POA: Diagnosis not present

## 2022-03-03 DIAGNOSIS — W19XXXA Unspecified fall, initial encounter: Secondary | ICD-10-CM

## 2022-03-03 DIAGNOSIS — S0990XA Unspecified injury of head, initial encounter: Secondary | ICD-10-CM | POA: Diagnosis present

## 2022-04-07 DEATH — deceased
